# Patient Record
Sex: Male | Born: 1940 | Race: White | Hispanic: No | Marital: Married | Smoking: Current every day smoker
Health system: Southern US, Community
[De-identification: ages and names within clinical notes are randomized; demographics above are authoritative.]

## PROBLEM LIST (undated history)

## (undated) DIAGNOSIS — N529 Male erectile dysfunction, unspecified: Secondary | ICD-10-CM

## (undated) DIAGNOSIS — M5137 Other intervertebral disc degeneration, lumbosacral region: Secondary | ICD-10-CM

## (undated) DIAGNOSIS — M75101 Unspecified rotator cuff tear or rupture of right shoulder, not specified as traumatic: Secondary | ICD-10-CM

## (undated) DIAGNOSIS — F32A Depression, unspecified: Secondary | ICD-10-CM

## (undated) DIAGNOSIS — I639 Cerebral infarction, unspecified: Secondary | ICD-10-CM

## (undated) DIAGNOSIS — I491 Atrial premature depolarization: Secondary | ICD-10-CM

## (undated) DIAGNOSIS — M75102 Unspecified rotator cuff tear or rupture of left shoulder, not specified as traumatic: Secondary | ICD-10-CM

## (undated) DIAGNOSIS — N486 Induration penis plastica: Secondary | ICD-10-CM

## (undated) DIAGNOSIS — I10 Essential (primary) hypertension: Secondary | ICD-10-CM

## (undated) DIAGNOSIS — K5730 Diverticulosis of large intestine without perforation or abscess without bleeding: Secondary | ICD-10-CM

## (undated) DIAGNOSIS — M1990 Unspecified osteoarthritis, unspecified site: Secondary | ICD-10-CM

## (undated) DIAGNOSIS — K559 Vascular disorder of intestine, unspecified: Secondary | ICD-10-CM

## (undated) DIAGNOSIS — Z87442 Personal history of urinary calculi: Secondary | ICD-10-CM

## (undated) DIAGNOSIS — I2510 Atherosclerotic heart disease of native coronary artery without angina pectoris: Secondary | ICD-10-CM

## (undated) DIAGNOSIS — M12811 Other specific arthropathies, not elsewhere classified, right shoulder: Secondary | ICD-10-CM

## (undated) DIAGNOSIS — N201 Calculus of ureter: Secondary | ICD-10-CM

## (undated) DIAGNOSIS — Z8546 Personal history of malignant neoplasm of prostate: Secondary | ICD-10-CM

## (undated) DIAGNOSIS — Z9889 Other specified postprocedural states: Secondary | ICD-10-CM

## (undated) DIAGNOSIS — Z8711 Personal history of peptic ulcer disease: Secondary | ICD-10-CM

## (undated) DIAGNOSIS — R159 Full incontinence of feces: Secondary | ICD-10-CM

## (undated) DIAGNOSIS — M51379 Other intervertebral disc degeneration, lumbosacral region without mention of lumbar back pain or lower extremity pain: Secondary | ICD-10-CM

## (undated) DIAGNOSIS — L309 Dermatitis, unspecified: Secondary | ICD-10-CM

## (undated) DIAGNOSIS — E785 Hyperlipidemia, unspecified: Secondary | ICD-10-CM

## (undated) DIAGNOSIS — F329 Major depressive disorder, single episode, unspecified: Secondary | ICD-10-CM

## (undated) DIAGNOSIS — D3502 Benign neoplasm of left adrenal gland: Secondary | ICD-10-CM

## (undated) DIAGNOSIS — Z8719 Personal history of other diseases of the digestive system: Secondary | ICD-10-CM

## (undated) DIAGNOSIS — K589 Irritable bowel syndrome without diarrhea: Secondary | ICD-10-CM

## (undated) DIAGNOSIS — M12812 Other specific arthropathies, not elsewhere classified, left shoulder: Secondary | ICD-10-CM

## (undated) DIAGNOSIS — K219 Gastro-esophageal reflux disease without esophagitis: Secondary | ICD-10-CM

## (undated) HISTORY — DX: Essential (primary) hypertension: I10

## (undated) HISTORY — DX: Full incontinence of feces: R15.9

## (undated) HISTORY — DX: Major depressive disorder, single episode, unspecified: F32.9

## (undated) HISTORY — DX: Cerebral infarction, unspecified: I63.9

## (undated) HISTORY — PX: COLONOSCOPY: SHX174

## (undated) HISTORY — DX: Gastro-esophageal reflux disease without esophagitis: K21.9

## (undated) HISTORY — PX: TONSILLECTOMY AND ADENOIDECTOMY: SUR1326

## (undated) HISTORY — DX: Depression, unspecified: F32.A

## (undated) HISTORY — DX: Male erectile dysfunction, unspecified: N52.9

## (undated) HISTORY — PX: LOOP RECORDER IMPLANT: SHX5954

## (undated) HISTORY — DX: Induration penis plastica: N48.6

## (undated) HISTORY — DX: Hyperlipidemia, unspecified: E78.5

## (undated) HISTORY — PX: CARDIOVASCULAR STRESS TEST: SHX262

## (undated) HISTORY — DX: Atherosclerotic heart disease of native coronary artery without angina pectoris: I25.10

## (undated) HISTORY — DX: Vascular disorder of intestine, unspecified: K55.9

## (undated) HISTORY — PX: NASAL SEPTUM SURGERY: SHX37

---

## 1997-06-08 HISTORY — PX: LUMBAR LAMINECTOMY: SHX95

## 2001-02-04 HISTORY — PX: ESOPHAGOGASTRODUODENOSCOPY (EGD) WITH ESOPHAGEAL DILATION: SHX5812

## 2003-07-20 ENCOUNTER — Inpatient Hospital Stay (HOSPITAL_COMMUNITY): Admission: EM | Admit: 2003-07-20 | Discharge: 2003-07-24 | Payer: Self-pay | Admitting: Emergency Medicine

## 2003-07-23 HISTORY — PX: CARDIAC CATHETERIZATION: SHX172

## 2003-10-20 ENCOUNTER — Inpatient Hospital Stay (HOSPITAL_COMMUNITY): Admission: EM | Admit: 2003-10-20 | Discharge: 2003-10-21 | Payer: Self-pay | Admitting: Emergency Medicine

## 2004-04-08 ENCOUNTER — Ambulatory Visit: Payer: Self-pay | Admitting: Internal Medicine

## 2004-04-22 ENCOUNTER — Ambulatory Visit: Payer: Self-pay | Admitting: Internal Medicine

## 2004-05-02 ENCOUNTER — Ambulatory Visit: Payer: Self-pay | Admitting: Internal Medicine

## 2004-05-05 ENCOUNTER — Emergency Department (HOSPITAL_COMMUNITY): Admission: EM | Admit: 2004-05-05 | Discharge: 2004-05-05 | Payer: Self-pay | Admitting: Emergency Medicine

## 2004-05-15 ENCOUNTER — Ambulatory Visit: Payer: Self-pay | Admitting: Family Medicine

## 2004-12-10 ENCOUNTER — Ambulatory Visit: Payer: Self-pay | Admitting: Internal Medicine

## 2005-02-05 ENCOUNTER — Ambulatory Visit: Payer: Self-pay | Admitting: Internal Medicine

## 2005-02-11 ENCOUNTER — Ambulatory Visit: Payer: Self-pay | Admitting: Internal Medicine

## 2005-02-19 ENCOUNTER — Ambulatory Visit: Payer: Self-pay | Admitting: Cardiology

## 2005-02-25 ENCOUNTER — Ambulatory Visit: Payer: Self-pay | Admitting: Internal Medicine

## 2005-03-02 ENCOUNTER — Ambulatory Visit: Payer: Self-pay | Admitting: Internal Medicine

## 2005-03-26 ENCOUNTER — Inpatient Hospital Stay (HOSPITAL_COMMUNITY): Admission: RE | Admit: 2005-03-26 | Discharge: 2005-03-31 | Payer: Self-pay | Admitting: Orthopaedic Surgery

## 2005-03-26 HISTORY — PX: LUMBAR FUSION: SHX111

## 2005-07-02 ENCOUNTER — Ambulatory Visit: Payer: Self-pay | Admitting: Internal Medicine

## 2005-08-04 ENCOUNTER — Ambulatory Visit: Payer: Self-pay | Admitting: Internal Medicine

## 2005-08-28 ENCOUNTER — Ambulatory Visit: Payer: Self-pay | Admitting: Internal Medicine

## 2005-09-01 ENCOUNTER — Ambulatory Visit: Payer: Self-pay | Admitting: Internal Medicine

## 2005-10-28 ENCOUNTER — Ambulatory Visit: Payer: Self-pay | Admitting: Internal Medicine

## 2006-01-21 ENCOUNTER — Ambulatory Visit: Payer: Self-pay | Admitting: Cardiology

## 2006-02-01 ENCOUNTER — Ambulatory Visit: Payer: Self-pay

## 2006-02-16 ENCOUNTER — Ambulatory Visit: Payer: Self-pay | Admitting: Internal Medicine

## 2006-03-25 ENCOUNTER — Ambulatory Visit: Payer: Self-pay | Admitting: Cardiology

## 2006-03-29 ENCOUNTER — Encounter: Admission: RE | Admit: 2006-03-29 | Discharge: 2006-03-29 | Payer: Self-pay | Admitting: Orthopaedic Surgery

## 2006-05-07 ENCOUNTER — Ambulatory Visit: Payer: Self-pay | Admitting: Internal Medicine

## 2006-06-25 ENCOUNTER — Ambulatory Visit: Payer: Self-pay | Admitting: Internal Medicine

## 2006-06-25 LAB — CONVERTED CEMR LAB
BUN: 14 mg/dL (ref 6–23)
CO2: 30 meq/L (ref 19–32)
Calcium: 9.7 mg/dL (ref 8.4–10.5)
Chloride: 105 meq/L (ref 96–112)
Creatinine, Ser: 1 mg/dL (ref 0.4–1.5)
GFR calc Af Amer: 96 mL/min
GFR calc non Af Amer: 79 mL/min
Glucose, Bld: 92 mg/dL (ref 70–99)
HCT: 45.1 % (ref 39.0–52.0)
Hemoglobin: 15 g/dL (ref 13.0–17.0)
MCHC: 33.2 g/dL (ref 30.0–36.0)
MCV: 94.3 fL (ref 78.0–100.0)
Platelets: 315 10*3/uL (ref 150–400)
Potassium: 3.7 meq/L (ref 3.5–5.1)
RBC: 4.78 M/uL (ref 4.22–5.81)
RDW: 12.5 % (ref 11.5–14.6)
Sodium: 142 meq/L (ref 135–145)
WBC: 7 10*3/uL (ref 4.5–10.5)

## 2006-07-13 DIAGNOSIS — K279 Peptic ulcer, site unspecified, unspecified as acute or chronic, without hemorrhage or perforation: Secondary | ICD-10-CM

## 2006-07-13 DIAGNOSIS — K219 Gastro-esophageal reflux disease without esophagitis: Secondary | ICD-10-CM

## 2006-07-13 DIAGNOSIS — I10 Essential (primary) hypertension: Secondary | ICD-10-CM

## 2006-07-13 DIAGNOSIS — N486 Induration penis plastica: Secondary | ICD-10-CM

## 2006-07-13 DIAGNOSIS — E785 Hyperlipidemia, unspecified: Secondary | ICD-10-CM

## 2006-07-13 DIAGNOSIS — F528 Other sexual dysfunction not due to a substance or known physiological condition: Secondary | ICD-10-CM

## 2006-07-14 ENCOUNTER — Ambulatory Visit: Payer: Self-pay | Admitting: Cardiology

## 2006-07-14 LAB — CONVERTED CEMR LAB
ALT: 29 units/L (ref 0–40)
AST: 26 units/L (ref 0–37)
Albumin: 3.8 g/dL (ref 3.5–5.2)
Alkaline Phosphatase: 74 units/L (ref 39–117)
BUN: 17 mg/dL (ref 6–23)
Bilirubin, Direct: 0.1 mg/dL (ref 0.0–0.3)
CO2: 28 meq/L (ref 19–32)
Calcium: 9.2 mg/dL (ref 8.4–10.5)
Chloride: 104 meq/L (ref 96–112)
Creatinine, Ser: 1 mg/dL (ref 0.4–1.5)
GFR calc Af Amer: 96 mL/min
GFR calc non Af Amer: 79 mL/min
Glucose, Bld: 90 mg/dL (ref 70–99)
Magnesium: 2.4 mg/dL (ref 1.5–2.5)
Potassium: 3.2 meq/L — ABNORMAL LOW (ref 3.5–5.1)
Sodium: 140 meq/L (ref 135–145)
Total Bilirubin: 0.5 mg/dL (ref 0.3–1.2)
Total Protein: 7 g/dL (ref 6.0–8.3)

## 2006-07-22 ENCOUNTER — Ambulatory Visit: Payer: Self-pay | Admitting: Cardiology

## 2006-07-22 LAB — CONVERTED CEMR LAB
BUN: 21 mg/dL (ref 6–23)
CO2: 28 meq/L (ref 19–32)
Calcium: 9.2 mg/dL (ref 8.4–10.5)
Chloride: 105 meq/L (ref 96–112)
Creatinine, Ser: 0.9 mg/dL (ref 0.4–1.5)
GFR calc Af Amer: 109 mL/min
GFR calc non Af Amer: 90 mL/min
Glucose, Bld: 112 mg/dL — ABNORMAL HIGH (ref 70–99)
Potassium: 3.5 meq/L (ref 3.5–5.1)
Sodium: 142 meq/L (ref 135–145)

## 2006-08-13 ENCOUNTER — Ambulatory Visit: Payer: Self-pay | Admitting: Cardiology

## 2006-08-31 ENCOUNTER — Ambulatory Visit: Payer: Self-pay | Admitting: Internal Medicine

## 2006-09-23 ENCOUNTER — Ambulatory Visit: Payer: Self-pay | Admitting: Gastroenterology

## 2006-09-24 ENCOUNTER — Ambulatory Visit: Payer: Self-pay | Admitting: Cardiology

## 2006-09-24 LAB — CONVERTED CEMR LAB
Cholesterol: 218 mg/dL (ref 0–200)
Direct LDL: 136.8 mg/dL
HDL: 36.2 mg/dL — ABNORMAL LOW (ref >39.0)
Total CHOL/HDL Ratio: 6
Triglycerides: 222 mg/dL (ref 0–149)
VLDL: 44 mg/dL — ABNORMAL HIGH (ref 0–40)

## 2006-10-07 ENCOUNTER — Ambulatory Visit: Payer: Self-pay | Admitting: Gastroenterology

## 2006-10-11 ENCOUNTER — Ambulatory Visit: Payer: Self-pay | Admitting: Cardiovascular Disease

## 2006-10-11 ENCOUNTER — Ambulatory Visit: Payer: Self-pay | Admitting: Internal Medicine

## 2006-10-11 LAB — CONVERTED CEMR LAB
BUN: 11 mg/dL (ref 6–23)
Basophils Absolute: 0.1 10*3/uL (ref 0.0–0.1)
Basophils Relative: 1.4 % — ABNORMAL HIGH (ref 0.0–1.0)
CO2: 28 meq/L (ref 19–32)
Calcium: 9.3 mg/dL (ref 8.4–10.5)
Chloride: 108 meq/L (ref 96–112)
Creatinine, Ser: 0.8 mg/dL (ref 0.4–1.5)
Eosinophils Absolute: 0.5 10*3/uL (ref 0.0–0.6)
Eosinophils Relative: 7.1 % — ABNORMAL HIGH (ref 0.0–5.0)
GFR calc Af Amer: 124 mL/min
GFR calc non Af Amer: 103 mL/min
Glucose, Bld: 89 mg/dL (ref 70–99)
HCT: 39.7 % (ref 39.0–52.0)
Hemoglobin: 14 g/dL (ref 13.0–17.0)
Lymphocytes Relative: 19.2 % (ref 12.0–46.0)
MCHC: 35.3 g/dL (ref 30.0–36.0)
MCV: 91.3 fL (ref 78.0–100.0)
Monocytes Absolute: 0.5 10*3/uL (ref 0.2–0.7)
Monocytes Relative: 7.1 % (ref 3.0–11.0)
Neutro Abs: 5 10*3/uL (ref 1.4–7.7)
Neutrophils Relative %: 65.2 % (ref 43.0–77.0)
Platelets: 355 10*3/uL (ref 150–400)
Potassium: 3.3 meq/L — ABNORMAL LOW (ref 3.5–5.1)
RBC: 4.35 M/uL (ref 4.22–5.81)
RDW: 12.3 % (ref 11.5–14.6)
Sodium: 142 meq/L (ref 135–145)
WBC: 7.6 10*3/uL (ref 4.5–10.5)

## 2006-11-03 ENCOUNTER — Ambulatory Visit: Payer: Self-pay | Admitting: Internal Medicine

## 2007-01-19 ENCOUNTER — Ambulatory Visit: Payer: Self-pay | Admitting: Internal Medicine

## 2007-01-19 DIAGNOSIS — R5383 Other fatigue: Secondary | ICD-10-CM

## 2007-01-19 DIAGNOSIS — R5381 Other malaise: Secondary | ICD-10-CM

## 2007-01-19 LAB — CONVERTED CEMR LAB
BUN: 17 mg/dL (ref 6–23)
CO2: 28 meq/L (ref 19–32)
Calcium: 9.8 mg/dL (ref 8.4–10.5)
Chloride: 101 meq/L (ref 96–112)
Creatinine, Ser: 0.9 mg/dL (ref 0.4–1.5)
GFR calc Af Amer: 108 mL/min
GFR calc non Af Amer: 89 mL/min
Glucose, Bld: 95 mg/dL (ref 70–99)
Potassium: 3.6 meq/L (ref 3.5–5.1)
Sodium: 138 meq/L (ref 135–145)

## 2007-01-20 ENCOUNTER — Encounter (INDEPENDENT_AMBULATORY_CARE_PROVIDER_SITE_OTHER): Payer: Self-pay | Admitting: *Deleted

## 2007-02-09 ENCOUNTER — Encounter (INDEPENDENT_AMBULATORY_CARE_PROVIDER_SITE_OTHER): Payer: Self-pay | Admitting: *Deleted

## 2007-02-14 ENCOUNTER — Ambulatory Visit: Payer: Self-pay | Admitting: Cardiology

## 2007-02-17 ENCOUNTER — Ambulatory Visit: Payer: Self-pay | Admitting: Cardiology

## 2007-04-06 ENCOUNTER — Ambulatory Visit: Payer: Self-pay | Admitting: Internal Medicine

## 2007-05-23 ENCOUNTER — Ambulatory Visit: Payer: Self-pay | Admitting: Internal Medicine

## 2007-07-28 ENCOUNTER — Encounter: Payer: Self-pay | Admitting: Internal Medicine

## 2007-08-22 ENCOUNTER — Ambulatory Visit: Payer: Self-pay | Admitting: Internal Medicine

## 2007-08-22 DIAGNOSIS — F450 Somatization disorder: Secondary | ICD-10-CM

## 2007-08-22 DIAGNOSIS — F329 Major depressive disorder, single episode, unspecified: Secondary | ICD-10-CM

## 2007-09-08 ENCOUNTER — Telehealth (INDEPENDENT_AMBULATORY_CARE_PROVIDER_SITE_OTHER): Payer: Self-pay | Admitting: *Deleted

## 2007-09-08 LAB — CONVERTED CEMR LAB
ALT: 54 units/L — ABNORMAL HIGH (ref 0–53)
AST: 25 units/L (ref 0–37)
BUN: 23 mg/dL (ref 6–23)
Basophils Absolute: 0 10*3/uL (ref 0.0–0.1)
Basophils Relative: 0.3 % (ref 0.0–1.0)
CO2: 31 meq/L (ref 19–32)
Calcium: 9.6 mg/dL (ref 8.4–10.5)
Chloride: 106 meq/L (ref 96–112)
Cholesterol: 212 mg/dL (ref 0–200)
Creatinine, Ser: 1 mg/dL (ref 0.4–1.5)
Direct LDL: 134.5 mg/dL
Eosinophils Absolute: 0.1 10*3/uL (ref 0.0–0.6)
Eosinophils Relative: 0.8 % (ref 0.0–5.0)
GFR calc Af Amer: 96 mL/min
GFR calc non Af Amer: 79 mL/min
Glucose, Bld: 94 mg/dL (ref 70–99)
HCT: 50.5 % (ref 39.0–52.0)
HDL: 54.3 mg/dL (ref >39.0)
Hemoglobin: 16.6 g/dL (ref 13.0–17.0)
Lymphocytes Relative: 11.2 % — ABNORMAL LOW (ref 12.0–46.0)
MCHC: 32.9 g/dL (ref 30.0–36.0)
MCV: 96.6 fL (ref 78.0–100.0)
Monocytes Absolute: 0.7 10*3/uL (ref 0.2–0.7)
Monocytes Relative: 5.5 % (ref 3.0–11.0)
Neutro Abs: 10 10*3/uL — ABNORMAL HIGH (ref 1.4–7.7)
Neutrophils Relative %: 82.2 % — ABNORMAL HIGH (ref 43.0–77.0)
PSA: 6.69 ng/mL — ABNORMAL HIGH (ref 0.10–4.00)
Platelets: 328 10*3/uL (ref 150–400)
Potassium: 4 meq/L (ref 3.5–5.1)
RBC: 5.22 M/uL (ref 4.22–5.81)
RDW: 13.6 % (ref 11.5–14.6)
Sodium: 142 meq/L (ref 135–145)
TSH: 0.58 microintl units/mL (ref 0.35–5.50)
Total CHOL/HDL Ratio: 3.9
Triglycerides: 137 mg/dL (ref 0–149)
VLDL: 27 mg/dL (ref 0–40)
WBC: 12.2 10*3/uL — ABNORMAL HIGH (ref 4.5–10.5)

## 2007-09-13 ENCOUNTER — Encounter (INDEPENDENT_AMBULATORY_CARE_PROVIDER_SITE_OTHER): Payer: Self-pay | Admitting: *Deleted

## 2007-09-23 ENCOUNTER — Encounter: Payer: Self-pay | Admitting: Internal Medicine

## 2007-10-14 ENCOUNTER — Telehealth (INDEPENDENT_AMBULATORY_CARE_PROVIDER_SITE_OTHER): Payer: Self-pay | Admitting: *Deleted

## 2007-10-26 ENCOUNTER — Encounter: Payer: Self-pay | Admitting: Internal Medicine

## 2007-11-08 ENCOUNTER — Encounter: Payer: Self-pay | Admitting: Internal Medicine

## 2007-11-16 ENCOUNTER — Encounter: Payer: Self-pay | Admitting: Internal Medicine

## 2007-11-28 ENCOUNTER — Ambulatory Visit: Payer: Self-pay | Admitting: Internal Medicine

## 2007-11-28 DIAGNOSIS — C61 Malignant neoplasm of prostate: Secondary | ICD-10-CM

## 2007-12-01 ENCOUNTER — Encounter (INDEPENDENT_AMBULATORY_CARE_PROVIDER_SITE_OTHER): Payer: Self-pay | Admitting: *Deleted

## 2007-12-01 LAB — CONVERTED CEMR LAB
ALT: 36 units/L (ref 0–53)
AST: 25 units/L (ref 0–37)
Basophils Absolute: 0.2 10*3/uL — ABNORMAL HIGH (ref 0.0–0.1)
Basophils Relative: 2.1 % — ABNORMAL HIGH (ref 0.0–1.0)
Cholesterol: 130 mg/dL (ref 0–200)
Eosinophils Absolute: 0.1 10*3/uL (ref 0.0–0.7)
Eosinophils Relative: 1.5 % (ref 0.0–5.0)
HCT: 41.7 % (ref 39.0–52.0)
HDL: 39.1 mg/dL (ref >39.0)
Hemoglobin: 14.9 g/dL (ref 13.0–17.0)
LDL Cholesterol: 70 mg/dL (ref 0–99)
Lymphocytes Relative: 13.7 % (ref 12.0–46.0)
MCHC: 35.6 g/dL (ref 30.0–36.0)
MCV: 98.7 fL (ref 78.0–100.0)
Monocytes Absolute: 0.1 10*3/uL (ref 0.1–1.0)
Monocytes Relative: 1.8 % — ABNORMAL LOW (ref 3.0–12.0)
Neutro Abs: 6.6 10*3/uL (ref 1.4–7.7)
Neutrophils Relative %: 80.9 % — ABNORMAL HIGH (ref 43.0–77.0)
Platelets: 285 10*3/uL (ref 150–400)
RBC: 4.22 M/uL (ref 4.22–5.81)
RDW: 11.8 % (ref 11.5–14.6)
Total CHOL/HDL Ratio: 3.3
Triglycerides: 104 mg/dL (ref 0–149)
VLDL: 21 mg/dL (ref 0–40)
WBC: 8.1 10*3/uL (ref 4.5–10.5)

## 2007-12-13 ENCOUNTER — Ambulatory Visit: Payer: Self-pay | Admitting: Internal Medicine

## 2007-12-14 ENCOUNTER — Encounter: Payer: Self-pay | Admitting: Internal Medicine

## 2007-12-19 HISTORY — PX: OTHER SURGICAL HISTORY: SHX169

## 2007-12-29 ENCOUNTER — Inpatient Hospital Stay (HOSPITAL_COMMUNITY): Admission: RE | Admit: 2007-12-29 | Discharge: 2008-01-02 | Payer: Self-pay | Admitting: Urology

## 2007-12-29 ENCOUNTER — Encounter: Payer: Self-pay | Admitting: Urology

## 2007-12-29 HISTORY — PX: OTHER SURGICAL HISTORY: SHX169

## 2008-01-07 ENCOUNTER — Emergency Department (HOSPITAL_COMMUNITY): Admission: EM | Admit: 2008-01-07 | Discharge: 2008-01-07 | Payer: Self-pay | Admitting: Emergency Medicine

## 2008-02-17 ENCOUNTER — Encounter: Payer: Self-pay | Admitting: Internal Medicine

## 2008-03-05 ENCOUNTER — Telehealth (INDEPENDENT_AMBULATORY_CARE_PROVIDER_SITE_OTHER): Payer: Self-pay | Admitting: *Deleted

## 2008-03-05 ENCOUNTER — Ambulatory Visit: Payer: Self-pay | Admitting: Internal Medicine

## 2008-03-06 ENCOUNTER — Ambulatory Visit: Payer: Self-pay | Admitting: Internal Medicine

## 2008-03-09 ENCOUNTER — Telehealth (INDEPENDENT_AMBULATORY_CARE_PROVIDER_SITE_OTHER): Payer: Self-pay | Admitting: *Deleted

## 2008-03-13 ENCOUNTER — Telehealth: Payer: Self-pay | Admitting: Internal Medicine

## 2008-03-13 ENCOUNTER — Emergency Department (HOSPITAL_COMMUNITY): Admission: EM | Admit: 2008-03-13 | Discharge: 2008-03-14 | Payer: Self-pay | Admitting: Emergency Medicine

## 2008-03-14 ENCOUNTER — Ambulatory Visit: Payer: Self-pay | Admitting: Internal Medicine

## 2008-03-14 ENCOUNTER — Telehealth: Payer: Self-pay | Admitting: Internal Medicine

## 2008-03-15 ENCOUNTER — Inpatient Hospital Stay (HOSPITAL_COMMUNITY): Admission: AD | Admit: 2008-03-15 | Discharge: 2008-03-17 | Payer: Self-pay | Admitting: Internal Medicine

## 2008-03-15 ENCOUNTER — Ambulatory Visit: Payer: Self-pay | Admitting: Internal Medicine

## 2008-03-15 ENCOUNTER — Ambulatory Visit: Payer: Self-pay | Admitting: Gastroenterology

## 2008-03-15 ENCOUNTER — Encounter: Payer: Self-pay | Admitting: Internal Medicine

## 2008-03-15 ENCOUNTER — Encounter: Payer: Self-pay | Admitting: Gastroenterology

## 2008-03-16 ENCOUNTER — Encounter: Payer: Self-pay | Admitting: Gastroenterology

## 2008-03-19 ENCOUNTER — Telehealth: Payer: Self-pay | Admitting: Internal Medicine

## 2008-03-23 ENCOUNTER — Ambulatory Visit: Payer: Self-pay | Admitting: Internal Medicine

## 2008-03-28 ENCOUNTER — Telehealth: Payer: Self-pay | Admitting: Internal Medicine

## 2008-04-05 ENCOUNTER — Telehealth: Payer: Self-pay | Admitting: Internal Medicine

## 2008-04-06 ENCOUNTER — Telehealth: Payer: Self-pay | Admitting: Gastroenterology

## 2008-04-10 ENCOUNTER — Ambulatory Visit: Payer: Self-pay | Admitting: Gastroenterology

## 2008-04-10 DIAGNOSIS — K5730 Diverticulosis of large intestine without perforation or abscess without bleeding: Secondary | ICD-10-CM

## 2008-04-10 LAB — CONVERTED CEMR LAB
BUN: 18 mg/dL (ref 6–23)
CO2: 31 meq/L (ref 19–32)
Calcium: 9.4 mg/dL (ref 8.4–10.5)
Chloride: 102 meq/L (ref 96–112)
Creatinine, Ser: 0.8 mg/dL (ref 0.4–1.5)
GFR calc Af Amer: 124 mL/min
GFR calc non Af Amer: 102 mL/min
Glucose, Bld: 89 mg/dL (ref 70–99)
Potassium: 4.2 meq/L (ref 3.5–5.1)
Sodium: 140 meq/L (ref 135–145)

## 2008-04-13 ENCOUNTER — Ambulatory Visit: Payer: Self-pay | Admitting: Cardiology

## 2008-05-01 ENCOUNTER — Encounter: Payer: Self-pay | Admitting: Internal Medicine

## 2008-07-02 ENCOUNTER — Telehealth: Payer: Self-pay | Admitting: Internal Medicine

## 2008-07-02 ENCOUNTER — Ambulatory Visit: Payer: Self-pay | Admitting: Cardiology

## 2008-07-02 ENCOUNTER — Telehealth: Payer: Self-pay | Admitting: Family Medicine

## 2008-07-02 LAB — CONVERTED CEMR LAB
BUN: 25 mg/dL — ABNORMAL HIGH (ref 6–23)
Basophils Absolute: 0.1 10*3/uL (ref 0.0–0.1)
Basophils Relative: 0.5 % (ref 0.0–3.0)
CO2: 31 meq/L (ref 19–32)
Calcium: 10.4 mg/dL (ref 8.4–10.5)
Chloride: 105 meq/L (ref 96–112)
Creatinine, Ser: 0.7 mg/dL (ref 0.4–1.5)
Eosinophils Absolute: 0.3 10*3/uL (ref 0.0–0.7)
Eosinophils Relative: 2.7 % (ref 0.0–5.0)
GFR calc Af Amer: 144 mL/min
GFR calc non Af Amer: 119 mL/min
Glucose, Bld: 85 mg/dL (ref 70–99)
HCT: 48.1 % (ref 39.0–52.0)
Hemoglobin: 16.7 g/dL (ref 13.0–17.0)
Lymphocytes Relative: 15.7 % (ref 12.0–46.0)
MCHC: 34.7 g/dL (ref 30.0–36.0)
MCV: 95.4 fL (ref 78.0–100.0)
Monocytes Absolute: 0.6 10*3/uL (ref 0.1–1.0)
Monocytes Relative: 5.4 % (ref 3.0–12.0)
Neutro Abs: 7.9 10*3/uL — ABNORMAL HIGH (ref 1.4–7.7)
Neutrophils Relative %: 75.7 % (ref 43.0–77.0)
Platelets: 278 10*3/uL (ref 150–400)
Potassium: 3.5 meq/L (ref 3.5–5.1)
RBC: 5.04 M/uL (ref 4.22–5.81)
RDW: 12.5 % (ref 11.5–14.6)
Sodium: 143 meq/L (ref 135–145)
TSH: 0.67 microintl units/mL (ref 0.35–5.50)
WBC: 10.6 10*3/uL — ABNORMAL HIGH (ref 4.5–10.5)

## 2008-07-04 ENCOUNTER — Ambulatory Visit: Payer: Self-pay

## 2008-07-12 ENCOUNTER — Encounter: Payer: Self-pay | Admitting: Cardiology

## 2008-07-12 ENCOUNTER — Ambulatory Visit: Payer: Self-pay

## 2008-07-31 ENCOUNTER — Ambulatory Visit: Payer: Self-pay | Admitting: Cardiology

## 2008-08-13 ENCOUNTER — Ambulatory Visit: Payer: Self-pay | Admitting: Internal Medicine

## 2008-10-10 ENCOUNTER — Ambulatory Visit: Payer: Self-pay | Admitting: Internal Medicine

## 2008-10-10 DIAGNOSIS — M545 Low back pain: Secondary | ICD-10-CM | POA: Insufficient documentation

## 2008-10-10 LAB — CONVERTED CEMR LAB
Bilirubin Urine: NEGATIVE
Blood in Urine, dipstick: NEGATIVE
Glucose, Urine, Semiquant: NEGATIVE
Ketones, urine, test strip: NEGATIVE
Nitrite: NEGATIVE
Protein, U semiquant: NEGATIVE
Specific Gravity, Urine: <1.005
Urobilinogen, UA: 0.2
WBC Urine, dipstick: NEGATIVE
pH: 7

## 2008-10-31 ENCOUNTER — Encounter: Payer: Self-pay | Admitting: Internal Medicine

## 2008-11-12 ENCOUNTER — Encounter: Payer: Self-pay | Admitting: Internal Medicine

## 2008-11-12 ENCOUNTER — Encounter: Admission: RE | Admit: 2008-11-12 | Discharge: 2008-11-12 | Payer: Self-pay | Admitting: Orthopaedic Surgery

## 2009-02-04 ENCOUNTER — Ambulatory Visit: Payer: Self-pay | Admitting: Cardiology

## 2009-02-04 DIAGNOSIS — R002 Palpitations: Secondary | ICD-10-CM | POA: Insufficient documentation

## 2009-02-04 DIAGNOSIS — I2510 Atherosclerotic heart disease of native coronary artery without angina pectoris: Secondary | ICD-10-CM | POA: Insufficient documentation

## 2009-02-13 ENCOUNTER — Ambulatory Visit: Payer: Self-pay | Admitting: Cardiology

## 2009-02-13 DIAGNOSIS — Z720 Tobacco use: Secondary | ICD-10-CM

## 2009-03-07 ENCOUNTER — Encounter (INDEPENDENT_AMBULATORY_CARE_PROVIDER_SITE_OTHER): Payer: Self-pay | Admitting: *Deleted

## 2009-04-12 ENCOUNTER — Ambulatory Visit: Payer: Self-pay | Admitting: Internal Medicine

## 2009-04-16 ENCOUNTER — Telehealth: Payer: Self-pay | Admitting: Internal Medicine

## 2009-04-16 LAB — CONVERTED CEMR LAB
ALT: 38 units/L (ref 0–53)
AST: 26 units/L (ref 0–37)
BUN: 20 mg/dL (ref 6–23)
CO2: 30 meq/L (ref 19–32)
Calcium: 9.9 mg/dL (ref 8.4–10.5)
Chloride: 107 meq/L (ref 96–112)
Cholesterol: 159 mg/dL (ref 0–200)
Creatinine, Ser: 0.9 mg/dL (ref 0.4–1.5)
GFR calc non Af Amer: 88.81 mL/min (ref >60)
Glucose, Bld: 99 mg/dL (ref 70–99)
HDL: 42.7 mg/dL (ref >39.00)
LDL Cholesterol: 80 mg/dL (ref 0–99)
Potassium: 4.9 meq/L (ref 3.5–5.1)
Sodium: 145 meq/L (ref 135–145)
TSH: 0.49 microintl units/mL (ref 0.35–5.50)
Total CHOL/HDL Ratio: 4
Triglycerides: 181 mg/dL — ABNORMAL HIGH (ref 0.0–149.0)
VLDL: 36.2 mg/dL (ref 0.0–40.0)

## 2009-05-06 ENCOUNTER — Encounter: Payer: Self-pay | Admitting: Internal Medicine

## 2009-05-14 ENCOUNTER — Emergency Department (HOSPITAL_COMMUNITY): Admission: EM | Admit: 2009-05-14 | Discharge: 2009-05-14 | Payer: Self-pay | Admitting: Emergency Medicine

## 2009-05-28 ENCOUNTER — Encounter: Payer: Self-pay | Admitting: Internal Medicine

## 2009-05-30 ENCOUNTER — Telehealth (INDEPENDENT_AMBULATORY_CARE_PROVIDER_SITE_OTHER): Payer: Self-pay | Admitting: *Deleted

## 2009-06-13 ENCOUNTER — Encounter (INDEPENDENT_AMBULATORY_CARE_PROVIDER_SITE_OTHER): Payer: Self-pay | Admitting: *Deleted

## 2009-06-19 ENCOUNTER — Encounter: Payer: Self-pay | Admitting: Internal Medicine

## 2009-07-30 ENCOUNTER — Encounter: Payer: Self-pay | Admitting: Cardiology

## 2009-07-31 ENCOUNTER — Ambulatory Visit: Payer: Self-pay | Admitting: Cardiology

## 2009-08-09 ENCOUNTER — Emergency Department (HOSPITAL_COMMUNITY): Admission: EM | Admit: 2009-08-09 | Discharge: 2009-08-09 | Payer: Self-pay | Admitting: Emergency Medicine

## 2009-08-10 ENCOUNTER — Emergency Department (HOSPITAL_COMMUNITY): Admission: EM | Admit: 2009-08-10 | Discharge: 2009-08-10 | Payer: Self-pay | Admitting: Emergency Medicine

## 2009-08-23 ENCOUNTER — Encounter: Payer: Self-pay | Admitting: Internal Medicine

## 2009-08-23 LAB — CONVERTED CEMR LAB
Bacteria, UA: NONE SEEN
Bilirubin Urine: NEGATIVE
Bilirubin Urine: NEGATIVE
Casts: NONE SEEN /lpf
Glucose, Urine, Semiquant: NEGATIVE
Glucose, Urine, Semiquant: NEGATIVE
Ketones, urine, test strip: NEGATIVE
Ketones, urine, test strip: NEGATIVE
Nitrite: NEGATIVE
Nitrite: NEGATIVE
Protein, U semiquant: 30
Protein, U semiquant: 30
RBC, Urine, dipstick: NEGATIVE
RBC, Urine, dipstick: NEGATIVE
Specific Gravity, Urine: 1.025
Specific Gravity, Urine: 1.025
Urobilinogen, UA: 0.2
Urobilinogen, UA: 0.2
WBC Urine, dipstick: NEGATIVE
WBC Urine, dipstick: NEGATIVE
pH: 6.5
pH: 6.5

## 2009-08-27 ENCOUNTER — Encounter: Payer: Self-pay | Admitting: Internal Medicine

## 2009-08-28 ENCOUNTER — Telehealth: Payer: Self-pay | Admitting: Internal Medicine

## 2009-08-28 ENCOUNTER — Encounter: Payer: Self-pay | Admitting: Internal Medicine

## 2009-08-28 DIAGNOSIS — R809 Proteinuria, unspecified: Secondary | ICD-10-CM | POA: Insufficient documentation

## 2009-08-30 ENCOUNTER — Ambulatory Visit: Payer: Self-pay | Admitting: Internal Medicine

## 2009-08-30 ENCOUNTER — Encounter: Payer: Self-pay | Admitting: Family Medicine

## 2009-08-30 LAB — CONVERTED CEMR LAB
Bilirubin Urine: NEGATIVE
Blood in Urine, dipstick: NEGATIVE
Glucose, Urine, Semiquant: NEGATIVE
Ketones, urine, test strip: NEGATIVE
Nitrite: NEGATIVE
Protein, U semiquant: NEGATIVE
Specific Gravity, Urine: 1.02
Urobilinogen, UA: 0.2
WBC Urine, dipstick: NEGATIVE
pH: 5

## 2009-08-31 ENCOUNTER — Encounter: Payer: Self-pay | Admitting: Family Medicine

## 2009-09-02 LAB — CONVERTED CEMR LAB
Casts: NONE SEEN /lpf
Crystals: NONE SEEN
Squamous Epithelial / HPF: NONE SEEN /lpf

## 2009-10-12 HISTORY — PX: OTHER SURGICAL HISTORY: SHX169

## 2009-10-22 ENCOUNTER — Ambulatory Visit (HOSPITAL_COMMUNITY): Admission: RE | Admit: 2009-10-22 | Discharge: 2009-10-23 | Payer: Self-pay | Admitting: Urology

## 2009-10-25 ENCOUNTER — Encounter: Payer: Self-pay | Admitting: Internal Medicine

## 2009-11-08 ENCOUNTER — Encounter: Payer: Self-pay | Admitting: Internal Medicine

## 2009-11-11 ENCOUNTER — Encounter: Payer: Self-pay | Admitting: Internal Medicine

## 2009-11-25 ENCOUNTER — Encounter: Payer: Self-pay | Admitting: Internal Medicine

## 2009-12-05 ENCOUNTER — Encounter: Payer: Self-pay | Admitting: Internal Medicine

## 2009-12-20 ENCOUNTER — Ambulatory Visit: Payer: Self-pay | Admitting: Internal Medicine

## 2009-12-20 ENCOUNTER — Ambulatory Visit (HOSPITAL_BASED_OUTPATIENT_CLINIC_OR_DEPARTMENT_OTHER): Admission: RE | Admit: 2009-12-20 | Discharge: 2009-12-20 | Payer: Self-pay | Admitting: Internal Medicine

## 2009-12-20 ENCOUNTER — Ambulatory Visit: Payer: Self-pay | Admitting: Interventional Radiology

## 2010-01-09 ENCOUNTER — Encounter: Payer: Self-pay | Admitting: Cardiology

## 2010-01-09 ENCOUNTER — Telehealth: Payer: Self-pay | Admitting: Cardiology

## 2010-01-24 ENCOUNTER — Telehealth: Payer: Self-pay | Admitting: Internal Medicine

## 2010-03-17 ENCOUNTER — Ambulatory Visit (HOSPITAL_COMMUNITY)
Admission: RE | Admit: 2010-03-17 | Discharge: 2010-03-17 | Payer: Self-pay | Source: Home / Self Care | Admitting: Surgery

## 2010-03-17 ENCOUNTER — Encounter: Payer: Self-pay | Admitting: Internal Medicine

## 2010-03-17 HISTORY — PX: INGUINAL HERNIA REPAIR: SUR1180

## 2010-05-15 ENCOUNTER — Encounter: Payer: Self-pay | Admitting: Internal Medicine

## 2010-05-29 ENCOUNTER — Encounter: Payer: Self-pay | Admitting: Internal Medicine

## 2010-06-11 ENCOUNTER — Telehealth (INDEPENDENT_AMBULATORY_CARE_PROVIDER_SITE_OTHER): Payer: Self-pay | Admitting: *Deleted

## 2010-06-18 ENCOUNTER — Ambulatory Visit
Admission: RE | Admit: 2010-06-18 | Discharge: 2010-06-18 | Payer: Self-pay | Source: Home / Self Care | Attending: Internal Medicine | Admitting: Internal Medicine

## 2010-06-22 ENCOUNTER — Telehealth: Payer: Self-pay | Admitting: Internal Medicine

## 2010-06-25 ENCOUNTER — Encounter: Payer: Self-pay | Admitting: Gastroenterology

## 2010-07-08 NOTE — Letter (Signed)
Summary: f/u incontinence-- Urology    [facility]   Imported By: Lanelle Bal 11/21/2009 09:10:45  _____________________________________________________________________  External Attachment:    Type:   Image     Comment:   External Document

## 2010-07-08 NOTE — Op Note (Signed)
Summary: Hernia Repair/[facility]  Hernia [facility]   Imported By: Lanelle Bal 03/25/2010 15:25:04  _____________________________________________________________________  External Attachment:    Type:   Image     Comment:   External Document

## 2010-07-08 NOTE — Progress Notes (Signed)
Summary: +protein in urine  Phone Note From Other Clinic   Caller: fax from Dr. Emily Filbert - Derm Summary of Call: Received a fax with UA resuts from derm.  Note on labs "+ protein - needs further eval by primary MD" Shary Decamp  August 28, 2009 12:03 PM please order a UA, U dip and microalb Dx proteinuria Hanan Mcwilliams E. Camauri Fleece MD  August 28, 2009 4:02 PM  discussed with pt Shary Decamp  August 29, 2009 10:12 AM   New Problems: PROTEINURIA (ICD-791.0)   New Problems: PROTEINURIA (ICD-791.0)     Urinalysis Test Date: 08/23/2009  Dipstick:                      Results                  Reference Range  Appearance:         Clear                    Clear Color:              Yellow                   Yellow Specific Gravity:   1.025                    1.003-1.029 pH:                 6.5                      4.5-8.0 Protein:            30                       Negative Glucose:            Negative                 Negative Ketone:             Negative                 Negative Bilirubin:          Negative                 Negative Blood:              Negative                 Negative Nitrite:            Negative                 Negative Urobilinogen:       0.2                      0.2-1.0 Leukocyte:          Negative                 Negative   Microscopic:  RBC:                0-5     /HPF Bacteria:           none seen/HPF WBC:                0-5     /HPF Casts:              none seen/LPF Crystals:  present - calcium oxalate/HPF  Comments:  mucous threads present  Test performed by:  Dr. Emily Filbert

## 2010-07-08 NOTE — Assessment & Plan Note (Signed)
Summary: f48m rsc from nos on 07/16/09/lg  Medications Added VESICARE 5 MG TABS (SOLIFENACIN SUCCINATE) once daily ASPIR-LOW 81 MG TBEC (ASPIRIN) one tablet daily      Allergies Added:   Primary Provider:  Willow Ora, MD  CC:  no complaints.  History of Present Illness: 70 yo with history of HTN, hyperlipidemia, nonobstructive CAD, and palpitations due to PACs/PVCs presents for followup.  Mr Palazzi has been doing well since I saw him last.  Palpitations resolved on the higher dose of Toprol XL.  He is still smoking 1 ppd and not really thinking about quitting.  No chest pain.  He does get short of breath with heavier exertion like pulling the trash can to the street.   ECG:  NSR, normal  Labs (11/10): TSH normal, LDL 80, HDL 43  Current Medications (verified): 1)  Simvastatin 20 Mg  Tabs (Simvastatin) .Marland Kitchen.. 1 By Mouth Qhs 2)  Triamterene-Hctz 37.5-25 Mg  Caps (Triamterene-Hctz) .... Take One Tab By Mouth Daily 3)  Cartia Xt 240 Mg  Cp24 (Diltiazem Hcl Coated Beads) .... Take One Tablet Once Daily 4)  Omeprazole 20 Mg  Cpdr (Omeprazole) .Marland Kitchen.. 1 By Mouth Two Times A Day 5)  Mvi 6)  Toprol Xl 25 Mg Xr24h-Tab (Metoprolol Succinate) .... (2) in The Morning and (1) in The Evening 7)  Vesicare 5 Mg Tabs (Solifenacin Succinate) .... Once Daily 8)  Aspir-Low 81 Mg Tbec (Aspirin) .... One Tablet Daily  Allergies (verified): 1)  ! Sudafed 2)  ! Lodine 3)  ! Sulfa  Past History:  Past Medical History: 1. Nonobstructive coronary artery disease.  The patient's left heart catheterization done in 2005 showed 30% left main stenosis, an extensively calcified LAD with moderate plaquing, and EF was 60%.  There was no obstructive disease. 2. Hypertension. 3. PAC/PVCs.   4. Peptic ulcer disease. 5. Gastroesophageal reflux disease with history of stricture. 6. Hypertension. 7. Hyperlipidemia. 8. Tobacco abuse.  The patient smokes pack a day.  He has been off Chantix in the past.  However, he did not  want to use Chantix due  to fear for side effects 9. Diverticulosis. 10. Prostate cancer status post radical prostatectomy in July 2009.  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 Depression Dx  3-09 13. Diastolic dysfunction: Echo (2/10) with EF 60%, mild focal basal septal hypertrophy, moderate diastolic dysfunction.   Family History: Reviewed history from 08/22/2007 and no changes required. colon ca--aunt prostate ca--no MI--F at age 75  Social History: Reviewed history from 04/12/2009 and no changes required. Married, wife is disabled 2 kids fully retired Patient currently smokes 1ppd Alcohol Use - no Illicit Drug Use - no Patient does not get regular exercise but is active   Vital Signs:  Patient profile:   70 year old male Height:      66 inches Weight:      140 pounds BMI:     22.68 Pulse rate:   56 / minute BP sitting:   124 / 66  (left arm) Cuff size:   regular  Vitals Entered By: Hardin Negus, RMA (July 31, 2009 12:06 PM)  Physical Exam  General:  Well developed, well nourished, in no acute distress. Neck:  Neck supple, no JVD. No masses, thyromegaly or abnormal cervical nodes. Lungs:  Mildly decreased breath sounds bilaterally.  Heart:  Non-displaced PMI, chest non-tender; regular rate and rhythm, S1, S2 without murmurs, rubs or gallops. Carotid upstroke normal, no bruit.  Pedals normal pulses. No edema, no varicosities. Abdomen:  Bowel sounds positive; abdomen soft and non-tender without masses, organomegaly, or hernias noted. No hepatosplenomegaly. Extremities:  No clubbing or cyanosis. Neurologic:  Alert and oriented x 3. Psych:  Normal affect.   Impression & Recommendations:  Problem # 1:  PALPITATIONS, CHRONIC (ICD-785.1) Resolved with higher dose of Toprol XL.  Continue current management.   Problem # 2:  CAD, NATIVE VESSEL (ICD-414.01) Mild nonobstructive CAD.  I have asked the patient to take ASA 81 mg daily (he is not taking aspirin regularly).    Problem # 3:  HYPERLIPIDEMIA (ICD-272.4) Given nonobstructive CAD, would keep LDL close to 70.  Continue current meds (last LDL 80).   Other Orders: EKG w/ Interpretation (93000)  Patient Instructions: 1)  Be sure to take Aspirin 81mg  daily---this should be buffered or coated. 2)  Your physician wants you to follow-up in: 1year.  You will receive a reminder letter in the mail two months in advance. If you don't receive a letter, please call our office to schedule the follow-up appointment.     Orders Added: 1)  EKG w/ Interpretation [93000]

## 2010-07-08 NOTE — Miscellaneous (Signed)
  Clinical Lists Changes  Observations: Added new observation of ECHOINTERP: SUMMARY   -  Overall left ventricular systolic function was normal. Left         ventricular ejection fraction was estimated to be 60 %. There         were no left ventricular regional wall motion abnormalities.         There was mild focal basal septal hypertrophy. Doppler         parameters were consistent with high left ventricular filling         pressure.   -  The aortic valve was mildly calcified.   -  There was mild calcification of the mitral valve,. There was mild         to moderate mitral annular calcification.   -  The left atrium was mildly dilated.     ---------------------------------------------------------------    Prepared and Electronically Authenticated by    Nicholes Mango MD   Confirmed 12-Jul-2008 18:31:37 (07/12/2009 9:50)      Echocardiogram  Procedure date:  07/12/2009  Findings:      SUMMARY   -  Overall left ventricular systolic function was normal. Left         ventricular ejection fraction was estimated to be 60 %. There         were no left ventricular regional wall motion abnormalities.         There was mild focal basal septal hypertrophy. Doppler         parameters were consistent with high left ventricular filling         pressure.   -  The aortic valve was mildly calcified.   -  There was mild calcification of the mitral valve,. There was mild         to moderate mitral annular calcification.   -  The left atrium was mildly dilated.     ---------------------------------------------------------------    Prepared and Electronically Authenticated by    Nicholes Mango MD   Confirmed 12-Jul-2008 18:31:37

## 2010-07-08 NOTE — Letter (Signed)
Summary: male sling  f/u      [facility]  [facility]   Imported By: Lanelle Bal 11/09/2009 11:19:57  _____________________________________________________________________  External Attachment:    Type:   Image     Comment:   External Document

## 2010-07-08 NOTE — Medication Information (Signed)
Summary: Letter Regarding Meloxicam & Hypertension/[facility]  Letter Regarding Meloxicam & Hypertension/[facility]   Imported By: Lanelle Bal 01/13/2010 10:52:51  _____________________________________________________________________  External Attachment:    Type:   Image     Comment:   External Document  Appended Document: Letter Regarding Meloxicam & Hypertension/[facility] currently not on meloxicam

## 2010-07-08 NOTE — Letter (Signed)
Summary: B hernia,refered to surgery---- Urology    [facility]   Imported By: Lanelle Bal 12/30/2009 13:58:42  _____________________________________________________________________  External Attachment:    Type:   Image     Comment:   External Document

## 2010-07-08 NOTE — Progress Notes (Signed)
Summary: stop ASA**DM review**   Phone Note From Other Clinic   Caller: nurse Pattricia Boss Summary of Call: Per Pattricia Boss from [facility] pt needs hernia surgery needs to stop ASA 5 days prior to procedure. is pt ok to do this? ofc [phone] fax [phone] Initial call taken by: Edman Circle,  January 09, 2010 10:46 AM  Follow-up for Phone Call        The pt needs bilateral Inguinal Hernia repair by Dr Manus Rudd.  The pt is not scheduled for this procedure at this time.  [facility] would like cardiac clearance and clearance for the pt to hold ASA 5 days prior from Dr Shirlee Latch.  I will forward this information to Dr Shirlee Latch for review.  Follow-up by: Julieta Gutting, RN, BSN,  January 09, 2010 11:36 AM     Appended Document: stop ASA**DM review** Ok for surgery and to hold ASA pre-op as long as he has not developed any new symptoms since I last saw him.   Appended Document: stop ASA**DM review** I called pt. to see if he has had any new symptoms since seen by Dr. Shirlee Latch. Pt. states he is doing well. He has not have any new symptoms since seen in office in Feb./11. I will fax this note [facility].

## 2010-07-08 NOTE — Assessment & Plan Note (Signed)
Summary: cough/congested//kn   Vital Signs:  Patient profile:   70 year old male Weight:      132.38 pounds Temp:     97.3 degrees F oral Pulse rate:   60 / minute Pulse rhythm:   regular BP sitting:   126 / 70  (left arm) Cuff size:   regular  Vitals Entered By: Army Fossa CMA (December 20, 2009 4:12 PM) CC: Pt here had a deep cough x 3-4 days. Comments - has not tried anything OTC.   History of Present Illness: sick for 4-5 days Cough, sinus congestion, severe chest congestion and some wheezing more than usual Some pain in the anterior chest ( tracheal area) with cough wife with the same symptoms, she was in the hospital for few days, the patient has visited her several times at the hospital  ROS No fever No nausea vomiting There is little sputum, nonbloody No aches and pains  Current Medications (verified): 1)  Simvastatin 20 Mg  Tabs (Simvastatin) .Marland Kitchen.. 1 By Mouth Qhs 2)  Triamterene-Hctz 37.5-25 Mg  Caps (Triamterene-Hctz) .... Take One Tab By Mouth Daily 3)  Cartia Xt 240 Mg  Cp24 (Diltiazem Hcl Coated Beads) .... Take One Tablet Once Daily 4)  Omeprazole 20 Mg  Cpdr (Omeprazole) .Marland Kitchen.. 1 By Mouth Two Times A Day 5)  Mvi 6)  Toprol Xl 25 Mg Xr24h-Tab (Metoprolol Succinate) .... (2) in The Morning and (1) in The Evening 7)  Vesicare 5 Mg Tabs (Solifenacin Succinate) .... Once Daily 8)  Aspir-Low 81 Mg Tbec (Aspirin) .... One Tablet Daily  Allergies: 1)  ! Sudafed 2)  ! Lodine 3)  ! Sulfa  Past History:  Past Medical History:        Hyperlipidemia       Hypertension       GERD (with stricture) and a h/o PUD       Prostate cancer, hx of (11/2007)--prostatectomy 7-09...no XRT       FATIGUE, CHRONIC       ERECTILE DYSFUNCTION       NEOP, BNG, ADRENAL GLAND        PEYRONIE'S DISEASE        Depression Dx  3-09 Tobacco abuse.   Diverticulosis.  Cardiac Cath Nonobstructive coronary artery disease.  The patient's left heart catheterization done in 2005 showed  30% left main stenosis, an extensively calcified LAD with moderate plaquing, and EF was 60%.  There was no obstructive disease. h/o palpitations , PAC/PVCs.  Diastolic dysfunction: Echo (2/10) with EF 60%, mild focal basal septal hypertrophy, moderate diastolic dysfunction.    Past Surgical History: Reviewed history from 02/02/2009 and no changes required. Spinal fusion ('99, '06) herniorrhaphy Prostatectomy  Physical Exam  General:  alert, well-developed, and well-nourished.  nontoxic Head:  face symmetric, nontender to palpation Ears:  R ear normal and L ear normal.   Nose:  noncongested Mouth:  no redness or discharge Lungs:  normal respiratory effort and no intercostal retractions.  rhonchi throughout, mild wheezing bilaterally, few crackles at bases? No increased work of breathing Heart:  normal rate, regular rhythm, and no murmur.   Extremities:  no clubbing or cyanosis no lower extremity edema   Impression & Recommendations:  Problem # 1:  BRONCHITIS (ICD-490)  bronchitis versus pneumonia Patient is nontoxic Plan: Chest x-ray Start Avelox, 3 sample provided, if chest x-ray shows  pneumonia he will need 4 additional days Mucinex DM albuterol Stop -hold tobacco  His updated medication list for this problem includes:  Ventolin Hfa 108 (90 Base) Mcg/act Aers (Albuterol sulfate) .Marland Kitchen... 2 puffs  every 6 hours as needed for cough or wheezing  Orders: T-2 View CXR (71020TC)  Problem # 2:   albuterol  Complete Medication List: 1)  Simvastatin 20 Mg Tabs (Simvastatin) .Marland Kitchen.. 1 by mouth qhs 2)  Triamterene-hctz 37.5-25 Mg Caps (Triamterene-hctz) .... Take one tab by mouth daily 3)  Cartia Xt 240 Mg Cp24 (Diltiazem hcl coated beads) .... Take one tablet once daily 4)  Omeprazole 20 Mg Cpdr (Omeprazole) .Marland Kitchen.. 1 by mouth two times a day 5)  Mvi  6)  Toprol Xl 25 Mg Xr24h-tab (Metoprolol succinate) .... (2) in the morning and (1) in the evening 7)  Vesicare 5 Mg Tabs  (Solifenacin succinate) .... Once daily 8)  Aspir-low 81 Mg Tbec (Aspirin) .... One tablet daily 9)  Ventolin Hfa 108 (90 Base) Mcg/act Aers (Albuterol sulfate) .... 2 puffs  every 6 hours as needed for cough or wheezing  Patient Instructions: 1)  rest, fluids, Tylenol as needed 2)  Chest x-ray 3)  Start Avelox, one tablet a day  (samples) 4)  Mucinex DM one tablet twice a day  until cough decrease 5)  albuterol 2 puffs every 6 hours as needed for cough, chest congestion or wheezing 6)  Stop -hold tobacco 7)  ER if you feel worse, high fever, shortness of breath Prescriptions: VENTOLIN HFA 108 (90 BASE) MCG/ACT AERS (ALBUTEROL SULFATE) 2 puffs  every 6 hours as needed for cough or wheezing  #1 x 0   Entered and Authorized by:   Nolon Rod. Paz MD   Signed by:   Nolon Rod. Paz MD on 12/20/2009   Method used:   Print then Give to Patient   RxID:   [id number]

## 2010-07-08 NOTE — Progress Notes (Signed)
Summary: REFILL REQUEST  Phone Note Refill Request Call back at [phone] Message from:  Pharmacy on January 24, 2010 9:12 AM  Refills Requested: Medication #1:  TRIAMTERENE-HCTZ 37.5-25 MG  CAPS take one tab by mouth daily   Dosage confirmed as above?Dosage Confirmed   Supply Requested: 1 month   Last Refilled: 04/15/2009   Notes: The generic dyazide is not available(which is capsules). Would it be OK to switch to generic maxzide(which is tablet--same strength). Please let us know if OK. thanks Sanjuana Mae [address]  Next Appointment Scheduled: NONE Initial call taken by: Lavell Islam,  January 24, 2010 9:20 AM  Follow-up for Phone Call        ok with me Darletta Noblett E. Kayann Maj MD  January 24, 2010 11:42 AM   Additional Follow-up for Phone Call Additional follow up Details #1::        sent in to pharm. Army Fossa CMA  January 24, 2010 11:50 AM     New/Updated Medications: MAXZIDE-25 37.5-25 MG TABS (TRIAMTERENE-HCTZ) 1 by mouth daily. Prescriptions: MAXZIDE-25 37.5-25 MG TABS (TRIAMTERENE-HCTZ) 1 by mouth daily.  #30 x 1   Entered by:   Army Fossa CMA   Authorized by:   Nolon Rod. Robbin Escher MD   Signed by:   Army Fossa CMA on 01/24/2010   Method used:   Electronically to        [facility]. [address] [phone]* (retail)       [address]       [place], [place]  [address]       Ph: [phone] or [phone]       Fax: [phone]   RxID:   [id number]

## 2010-07-08 NOTE — Letter (Signed)
Summary: shoulder bursitis, Rx injection B   [facility]   Imported By: Lanelle Bal 12/17/2009 14:00:30  _____________________________________________________________________  External Attachment:    Type:   Image     Comment:   External Document

## 2010-07-08 NOTE — Letter (Signed)
Summary: [facility]  [facility]   Imported By: Lanelle Bal 09/04/2009 13:51:16  _____________________________________________________________________  External Attachment:    Type:   Image     Comment:   External Document

## 2010-07-08 NOTE — Letter (Signed)
Summary: Appointment - Reminder 2  [facility], Main Office  [address]   [place], [place] [address]   Phone: [phone]  Fax: [phone]     June 13, 2009 MRN: [id number]   [facility] Biena[facility] [address] [place], [place]  [address]   Dear Mr. PILLSBURY,  Our records indicate that it is time to schedule a follow-up appointment with Dr. Shirlee Latch. It is very important that we reach you to schedule this appointment. We look forward to participating in your health care needs. Please contact us at the number listed above at your earliest convenience to schedule your appointment.  If you are unable to make an appointment at this time, give [place] a call so we can update our records.  Sincerely,   [facility] Scheduling Team

## 2010-07-08 NOTE — Letter (Signed)
Summary: [facility]  [facility]   Imported By: Lanelle Bal 11/21/2009 08:57:00  _____________________________________________________________________  External Attachment:    Type:   Image     Comment:   External Document

## 2010-07-08 NOTE — Letter (Signed)
Summary: [facility]   Imported By: Roderic Ovens 01/16/2010 09:26:25  _____________________________________________________________________  External Attachment:    Type:   Image     Comment:   External Document

## 2010-07-08 NOTE — Letter (Signed)
Summary: [facility]  [facility]   Imported By: Lanelle Bal 06/26/2009 13:12:14  _____________________________________________________________________  External Attachment:    Type:   Image     Comment:   External Document

## 2010-07-09 ENCOUNTER — Ambulatory Visit: Admit: 2010-07-09 | Payer: Self-pay | Admitting: Internal Medicine

## 2010-07-09 ENCOUNTER — Other Ambulatory Visit: Payer: Self-pay

## 2010-07-10 NOTE — Letter (Signed)
Summary: PSA   0, incont. better w/  sling--- [facility]  [facility]   Imported By: Lanelle Bal 05/29/2010 11:30:39  _____________________________________________________________________  External Attachment:    Type:   Image     Comment:   External Document

## 2010-07-10 NOTE — Letter (Signed)
Summary: [facility]  [facility]   Imported By: Lanelle Bal 06/11/2010 10:11:47  _____________________________________________________________________  External Attachment:    Type:   Image     Comment:   External Document

## 2010-07-10 NOTE — Progress Notes (Signed)
Summary: due cscope   Phone Note Outgoing Call   Summary of Call: advise patient: per GI he is due for [place], please arrange if patient is willing to go [place] E. Yasmina Chico MD  June 22, 2010 2:45 PM   Follow-up for Phone Call        I spoke w/ pt he states he will be willing to go, will arrange. Army Fossa CMA  June 23, 2010 1:49 PM

## 2010-07-10 NOTE — Letter (Signed)
Summary: Pre Visit Letter Revised  [facility]  [address] [place], [place] [address]   Phone: [phone]  Fax: [phone]        06/25/2010 MRN: [id number]  [facility] [address] [place], [place]  [address]             Procedure Date:  07-30-10 11am           Dr Russella Dar  - Recall Colon   Welcome to the Gastroenterology Division at [facility].    You are scheduled to see a nurse for your pre-procedure visit on 07-16-10 at 10am on the 3rd floor at [facility], [address]  We ask that you try to arrive at our office 15 minutes prior to your appointment time to allow for check-in.  Please take a minute to review the attached form.  If you answer "Yes" to one or more of the questions on the first page, we ask that you call the person listed at your earliest opportunity.  If you answer "No" to all of the questions, please complete the rest of the form and bring it to your appointment.    Your nurse visit will consist of discussing your medical and surgical history, your immediate family medical history, and your medications.   If you are unable to list all of your medications on the form, please bring the medication bottles to your appointment and we will list them.  We will need to be aware of both prescribed and over the counter drugs.  We will need to know exact dosage information as well.    Please be prepared to read and sign documents such as consent forms, a financial agreement, and acknowledgement forms.  If necessary, and with your consent, a friend or relative is welcome to sit-in on the nurse visit with you.  Please bring your insurance card so that we may make a copy of it.  If your insurance requires a referral to see a specialist, please bring your referral form from your primary care physician.  No co-pay is required for this nurse visit.     If you cannot keep your appointment, please call [phone] to cancel or reschedule prior  to your appointment date.  This allows [place] the opportunity to schedule an appointment for another patient in need of care.    Thank you for choosing  Gastroenterology for your medical needs.  We appreciate the opportunity to care for you.  Please visit [place] at our website  to learn more about our practice.  Sincerely, The Gastroenterology Division

## 2010-07-10 NOTE — Assessment & Plan Note (Signed)
Summary: cpx//lch   Vital Signs:  Patient profile:   71 year old male Height:      66 inches Weight:      133 pounds BMI:     21.54 Pulse rate:   58 / minute Pulse rhythm:   regular BP sitting:   124 / 78  (left arm) Cuff size:   regular  Vitals Entered By: Army Fossa CMA (June 18, 2010 1:23 PM) CC: cpx,not fasting  Comments no complaints [facility] [address]    History of Present Illness: Here for Medicare AWV: 1.   Risk factors based on Past M, S, F history: reviewed 2.   Physical Activities: very active at home, walks dog daily  3.   Depression/mood: occasionally gets depress , wife has a # of medical issues, has some family                 problems , overall doesn't thinnks he needs medicine  4.   Hearing: no problems reported or noted to normal conversation  5.   ADL's: independent  6.   Fall Risk: no recent falls  7.   Home Safety: does feel safe at home  8.   Height, weight, &visual acuity: seee VS, saw an eye doctor within a year , got ne glasses  9.   Counseling: yes  10.   Labs ordered based on risk factors: yes 11.           Referral Coordination, if needed  12.           Care Plan, see a/p 13.            Cognitive Assessment: motor skills, cognition and memory seemed normal  in addition, we discussed the following Hyperlipidemia -- good medication compliance    Hypertension-- ambulatory BPs wnl  Prostate cancer, saw urology 12/11, PSA close to zero, incontinence better with a sling Tobacco abuse-- still smoking. He has a lot of stress in his life right now and he states he is not  ready to quit    Preventive Screening-Counseling & Management  Alcohol-Tobacco     Packs/Day: 1  Caffeine-Diet-Exercise     Does Patient Exercise: yes  Current Medications (verified): 1)  Simvastatin 20 Mg  Tabs (Simvastatin) .Marland Kitchen.. 1 By Mouth Qhs 2)  Triamterene-Hctz 37.5-25 Mg  Caps (Triamterene-Hctz) .... Take One Tab By Mouth Daily. Due For Office Visit. 3)   Cartia Xt 240 Mg  Cp24 (Diltiazem Hcl Coated Beads) .... Take One Tablet Once Daily 4)  Omeprazole 20 Mg  Cpdr (Omeprazole) .Marland Kitchen.. 1 By Mouth Two Times A Day 5)  Mvi 6)  Toprol Xl 25 Mg Xr24h-Tab (Metoprolol Succinate) .... (2) in The Morning and (1) in The Evening. Due For Office Visit Before Additional Refills. 7)  Aspir-Low 81 Mg Tbec (Aspirin) .... One Tablet Daily 8)  Maxzide-25 37.5-25 Mg Tabs (Triamterene-Hctz) .Marland Kitchen.. 1 By Mouth Daily.  Allergies (verified): 1)  ! Sudafed 2)  ! Lodine 3)  ! Sulfa  Past History:  Past Medical History: Reviewed history from 12/20/2009 and no changes required.        Hyperlipidemia       Hypertension       GERD (with stricture) and a h/o PUD       Prostate cancer, hx of (11/2007)--prostatectomy 7-09...no XRT       FATIGUE, CHRONIC       ERECTILE DYSFUNCTION       NEOP, BNG, ADRENAL GLAND  PEYRONIE'S DISEASE        Depression Dx  3-09 Tobacco abuse.   Diverticulosis.  Cardiac Cath Nonobstructive coronary artery disease.  The patient's left heart catheterization done in 2005 showed 30% left main stenosis, an extensively calcified LAD with moderate plaquing, and EF was 60%.  There was no obstructive disease. h/o palpitations , PAC/PVCs.  Diastolic dysfunction: Echo (2/10) with EF 60%, mild focal basal septal hypertrophy, moderate diastolic dysfunction.    Past Surgical History: Spinal fusion ('99, '06) herniorrhaphy Prostatectomy B hernia repair 10-11   Family History: colon ca--aunt prostate ca--no MI--F at age 32 DM-- no  Social History: Married, wife is disabled 2 kids fully retired, thinking about goung back to work Patient currently smokes 1ppd Alcohol Use - no Illicit Drug Use - no Patient does not get regular exercise but is active  Packs/Day:  1 Does Patient Exercise:  yes  Review of Systems CV:  Denies chest pain or discomfort and swelling of feet. Resp:  Denies cough and wheezing. GI:  Denies bloody stools,  nausea, and vomiting. GU:  Denies dysuria and hematuria.  Physical Exam  General:  alert, well-developed, and well-nourished.   Neck:  full ROM, no thyromegaly, and normal carotid upstroke.   Lungs:  normal respiratory effort, no intercostal retractions, no accessory muscle use, and normal breath sounds.   Heart:  normal rate, regular rhythm, and no murmur.   Abdomen:  soft, non-tender, no distention, and no masses.  no bruit. Extremities:  no pretibial edema bilaterally  Psych:  Cognition and judgment appear intact. Alert and cooperative with normal attention span and concentration. not anxious appearing and not depressed appearing.     Impression & Recommendations:  Problem # 1:  HEALTH SCREENING (ICD-V70.0)  Td 2004 pneumonia shot 2009 shingles shot benefits discussed, Rx provided  recommend to get a flu shot  at the pharmacy, we ran out    Cscope 2002--no polyps + FH colon ca (aunt had colon ca) flex sig 10-09 done for diarrhea ?next scope----Will ask GI   PSA per urology  tobacco abuse, patient declined counseling, "I am not ready to quit " Diet exercise discussed  Orders: Medicare -1st Annual Wellness Visit [phone])  Problem # 2:  CAD, NATIVE VESSEL (ICD-414.01) asymptomatic His updated medication list for this problem includes:    Triamterene-hctz 37.5-25 Mg Caps (Triamterene-hctz) .Marland Kitchen... Take one tab by mouth daily. due for office visit.    Cartia Xt 240 Mg Cp24 (Diltiazem hcl coated beads) .Marland Kitchen... Take one tablet once daily    Maxzide-25 37.5-25 Mg Tabs (Triamterene-hctz) .Marland Kitchen... 1 by mouth daily.    Toprol Xl 25 Mg Xr24h-tab (Metoprolol succinate) ..... (2) in the morning and (1) in the evening. due for office visit before additional refills.    Aspir-low 81 Mg Tbec (Aspirin) ..... One tablet daily  Problem # 3:  DEPRESSION (ICD-311) still depressed to some extent, declines medication  Problem # 4:  HYPERLIPIDEMIA (ICD-272.4) RF, labs His updated medication list  for this problem includes:    Simvastatin 20 Mg Tabs (Simvastatin) .Marland Kitchen... 1 by mouth qhs  Labs Reviewed: SGOT: 26 (04/12/2009)   SGPT: 38 (04/12/2009)   HDL:42.70 (04/12/2009), 39.1 (11/28/2007)  LDL:80 (04/12/2009), 70 (11/28/2007)  Chol:159 (04/12/2009), 130 (11/28/2007)  Trig:181.0 (04/12/2009), 104 (11/28/2007)  Problem # 5:  HYPERTENSION (ICD-401.9) RF, labs His updated medication list for this problem includes:    Triamterene-hctz 37.5-25 Mg Caps (Triamterene-hctz) .Marland Kitchen... Take one tab by mouth daily. due for office visit.  Cartia Xt 240 Mg Cp24 (Diltiazem hcl coated beads) .Marland Kitchen... Take one tablet once daily    Maxzide-25 37.5-25 Mg Tabs (Triamterene-hctz) .Marland Kitchen... 1 by mouth daily.    Toprol Xl 25 Mg Xr24h-tab (Metoprolol succinate) ..... (2) in the morning and (1) in the evening. due for office visit before additional refills.  BP today: 124/78 Prior BP: 126/70 (12/20/2009)  Labs Reviewed: K+: 4.9 (04/12/2009) Creat: : 0.9 (04/12/2009)   Chol: 159 (04/12/2009)   HDL: 42.70 (04/12/2009)   LDL: 80 (04/12/2009)   TG: 181.0 (04/12/2009)  Complete Medication List: 1)  Simvastatin 20 Mg Tabs (Simvastatin) .Marland Kitchen.. 1 by mouth qhs 2)  Triamterene-hctz 37.5-25 Mg Caps (Triamterene-hctz) .... Take one tab by mouth daily. due for office visit. 3)  Cartia Xt 240 Mg Cp24 (Diltiazem hcl coated beads) .... Take one tablet once daily 4)  Maxzide-25 37.5-25 Mg Tabs (Triamterene-hctz) .Marland Kitchen.. 1 by mouth daily. 5)  Toprol Xl 25 Mg Xr24h-tab (Metoprolol succinate) .... (2) in the morning and (1) in the evening. due for office visit before additional refills. 6)  Omeprazole 20 Mg Cpdr (Omeprazole) .Marland Kitchen.. 1 by mouth two times a day 7)  Mvi  8)  Aspir-low 81 Mg Tbec (Aspirin) .... One tablet daily 9)  Incontinence Medicine Per Urology  10)  Zostavax 52841 Unt/0.32ml Solr (Zoster vaccine live) .... One  Patient Instructions: 1)  please come back fasting 2)  BMP CBC---- dx hypertension 3)  FLP, AST, ALT------  dx  high cholesterol 4)  Please schedule a follow-up appointment in 6 months .  Prescriptions: ZOSTAVAX 32440 UNT/0.65ML SOLR (ZOSTER VACCINE LIVE) one  #1 x 0   Entered and Authorized by:   Nolon Rod. Muranda Coye MD   Signed by:   Nolon Rod. Tahirah Sara MD on 06/18/2010   Method used:   Print then Give to Patient   RxID:   [id number] MAXZIDE-25 37.5-25 MG TABS (TRIAMTERENE-HCTZ) 1 by mouth daily.  #90 x 2   Entered by:   Army Fossa CMA   Authorized by:   Nolon Rod. Ashauna Bertholf MD   Signed by:   Army Fossa CMA on 06/18/2010   Method used:   Print then Give to Patient   RxID:   [id number] TOPROL XL 25 MG XR24H-TAB (METOPROLOL SUCCINATE) (2) in the morning and (1) in the evening. DUE FOR OFFICE VISIT BEFORE ADDITIONAL REFILLS.  #90 Tablet x 2   Entered by:   Army Fossa CMA   Authorized by:   Nolon Rod. Clell Trahan MD   Signed by:   Army Fossa CMA on 06/18/2010   Method used:   Print then Give to Patient   RxID:   [id number] OMEPRAZOLE 20 MG  CPDR (OMEPRAZOLE) 1 by mouth two times a day  #180 x 2   Entered by:   Army Fossa CMA   Authorized by:   Nolon Rod. Saniyah Mondesir MD   Signed by:   Army Fossa CMA on 06/18/2010   Method used:   Print then Give to Patient   RxID:   [id number] CARTIA XT 240 MG  CP24 (DILTIAZEM HCL COATED BEADS) take one tablet once daily  #90 x 2   Entered by:   Army Fossa CMA   Authorized by:   Nolon Rod. Landrie Beale MD   Signed by:   Army Fossa CMA on 06/18/2010   Method used:   Print then Give to Patient   RxID:   [id number] TRIAMTERENE-HCTZ 37.5-25 MG  CAPS (TRIAMTERENE-HCTZ) take one tab by mouth daily. DUE FOR  OFFICE VISIT.  #90 x 2   Entered by:   Army Fossa CMA   Authorized by:   Nolon Rod. Demia Viera MD   Signed by:   Army Fossa CMA on 06/18/2010   Method used:   Print then Give to Patient   RxID:   [id number] SIMVASTATIN 20 MG  TABS (SIMVASTATIN) 1 by mouth qhs  #90 Tablet x 2   Entered by:   Army Fossa CMA   Authorized by:   Nolon Rod. Kashia Brossard  MD   Signed by:   Army Fossa CMA on 06/18/2010   Method used:   Print then Give to Patient   RxID:   [id number]    Orders Added: 1)  Est. Patient Level III [66440] 2)  Medicare -1st Annual Wellness Visit [G0438]     Risk Factors:  Exercise:  yes

## 2010-07-10 NOTE — Progress Notes (Signed)
Summary: CPX  due  Phone Note Outgoing Call   Call placed by: Army Fossa CMA,  June 11, 2010 11:54 AM Summary of Call: Please call pt and have pt schedule a CPX. Army Fossa CMA  June 11, 2010 11:54 AM   Follow-up for Phone Call        Patient has an appt on 1.11.12 @ 1:20pm..[facility]  June 11, 2010 11:57 AM

## 2010-07-15 ENCOUNTER — Encounter (INDEPENDENT_AMBULATORY_CARE_PROVIDER_SITE_OTHER): Payer: Self-pay | Admitting: *Deleted

## 2010-07-16 ENCOUNTER — Encounter: Payer: Self-pay | Admitting: Gastroenterology

## 2010-07-21 ENCOUNTER — Other Ambulatory Visit: Payer: Self-pay | Admitting: Internal Medicine

## 2010-07-21 ENCOUNTER — Encounter (INDEPENDENT_AMBULATORY_CARE_PROVIDER_SITE_OTHER): Payer: Self-pay | Admitting: *Deleted

## 2010-07-21 ENCOUNTER — Other Ambulatory Visit (INDEPENDENT_AMBULATORY_CARE_PROVIDER_SITE_OTHER): Payer: Medicare Other

## 2010-07-21 DIAGNOSIS — I10 Essential (primary) hypertension: Secondary | ICD-10-CM

## 2010-07-21 DIAGNOSIS — E785 Hyperlipidemia, unspecified: Secondary | ICD-10-CM

## 2010-07-21 LAB — BASIC METABOLIC PANEL
BUN: 20 mg/dL (ref 6–23)
Creatinine, Ser: 0.8 mg/dL (ref 0.4–1.5)
GFR: 98.51 mL/min (ref >60.00)
Glucose, Bld: 79 mg/dL (ref 70–99)
Potassium: 4.1 mEq/L (ref 3.5–5.1)

## 2010-07-21 LAB — CBC WITH DIFFERENTIAL/PLATELET
Basophils Relative: 0.8 % (ref 0.0–3.0)
Eosinophils Absolute: 0.5 10*3/uL (ref 0.0–0.7)
Eosinophils Relative: 5 % (ref 0.0–5.0)
HCT: 43.1 % (ref 39.0–52.0)
Hemoglobin: 15 g/dL (ref 13.0–17.0)
Lymphs Abs: 1.7 10*3/uL (ref 0.7–4.0)
MCHC: 34.9 g/dL (ref 30.0–36.0)
MCV: 94.8 fl (ref 78.0–100.0)
Monocytes Absolute: 0.5 10*3/uL (ref 0.1–1.0)
Neutro Abs: 7.7 10*3/uL (ref 1.4–7.7)
Neutrophils Relative %: 72.7 % (ref 43.0–77.0)
RBC: 4.54 Mil/uL (ref 4.22–5.81)
WBC: 10.5 10*3/uL (ref 4.5–10.5)

## 2010-07-21 LAB — ALT: ALT: 35 U/L (ref 0–53)

## 2010-07-24 NOTE — Letter (Signed)
Summary: [facility] Instructions  [facility]  [address] [place], [place] [address]   Phone: [phone]  Fax: [phone]       [name]    71/15/1942    MRN: [id number]        Procedure Day [name]:  [facility]  07/30/10     Arrival Time: 10:00am     Procedure Time: 11:00am     Location of Procedure:                    [name] _  [facility] (4th Floor)                       PREPARATION FOR COLONOSCOPY WITH MOVIPREP   Starting 5 days prior to your procedure  FRIDAY 02/17  do not eat nuts, seeds, popcorn, corn, beans, peas,  salads, or any raw vegetables.  Do not take any fiber supplements (e.g. Metamucil, Citrucel, and Benefiber).  THE DAY BEFORE YOUR PROCEDURE         DATE: TUESDAY  02/21  1.  Drink clear liquids the entire day-NO SOLID FOOD  2.  Do not drink anything colored red or purple.  Avoid juices with pulp.  No orange juice.  3.  Drink at least 64 oz. (8 glasses) of fluid/clear liquids during the day to prevent dehydration and help the prep work efficiently.  CLEAR LIQUIDS INCLUDE: Water Jello Ice Popsicles Tea (sugar ok, no milk/cream) Powdered fruit flavored drinks Coffee (sugar ok, no milk/cream) Gatorade Juice: apple, white grape, white cranberry  Lemonade Clear bullion, consomm, broth Carbonated beverages (any kind) Strained chicken noodle soup Hard Candy                             4.  In the morning, mix first dose of MoviPrep solution:    Empty 1 Pouch A and 1 Pouch B into the disposable container    Add lukewarm drinking water to the top line of the container. Mix to dissolve    Refrigerate (mixed solution should be used within 24 hrs)  5.  Begin drinking the prep at 5:00 p.m. The MoviPrep container is divided by 4 marks.   Every 15 minutes drink the solution down to the next mark (approximately 8 oz) until the full liter is complete.   6.  Follow completed prep with 16 oz of clear liquid of your choice (Nothing  red or purple).  Continue to drink clear liquids until bedtime.  7.  Before going to bed, mix second dose of MoviPrep solution:    Empty 1 Pouch A and 1 Pouch B into the disposable container    Add lukewarm drinking water to the top line of the container. Mix to dissolve    Refrigerate  THE DAY OF YOUR PROCEDURE      DATE: [facility]  02/22  Beginning at  6:00a.m. (5 hours before procedure):         1. Every 15 minutes, drink the solution down to the next mark (approx 8 oz) until the full liter is complete.  2. Follow completed prep with 16 oz. of clear liquid of your choice.    3. You may drink clear liquids until 9:00am  (2 HOURS BEFORE PROCEDURE).   MEDICATION INSTRUCTIONS  Unless otherwise instructed, you should take regular prescription medications with a small sip of water   as early as possible the morning  of your procedure.         OTHER INSTRUCTIONS  You will need a responsible adult at least 71 years of age to accompany you and drive you home.   This person must remain in the waiting room during your procedure.  Wear loose fitting clothing that is easily removed.  Leave jewelry and other valuables at home.  However, you may wish to bring a book to read or  an iPod/MP3 player to listen to music as you wait for your procedure to start.  Remove all body piercing jewelry and leave at home.  Total time from sign-in until discharge is approximately 2-3 hours.  You should go home directly after your procedure and rest.  You can resume normal activities the  day after your procedure.  The day of your procedure you should not:   Drive   Make legal decisions   Operate machinery   Drink alcohol   Return to work  You will receive specific instructions about eating, activities and medications before you leave.    The above instructions have been reviewed and explained to me by   Karl Bales RN  July 16, 2010 10:29 AM    I fully understand and  can verbalize these instructions _____________________________ Date _________

## 2010-07-24 NOTE — Miscellaneous (Signed)
Summary: [facility] previsit  Clinical Lists Changes  Medications: Added new medication of MOVIPREP 100 GM  SOLR (PEG-KCL-NACL-NASULF-NA ASC-C) As per prep instructions. - Signed Rx of MOVIPREP 100 GM  SOLR (PEG-KCL-NACL-NASULF-NA ASC-C) As per prep instructions.;  #1 x 0;  Signed;  Entered by: Karl Bales RN;  Authorized by: Meryl Dare MD [facility];  Method used: Electronically to [employer] [facility]. [place]. [phone]*, [address], [place], [place], [place]  [address], Ph: [phone] or [phone], Fax: [phone]    Prescriptions: MOVIPREP 100 GM  SOLR (PEG-KCL-NACL-NASULF-NA ASC-C) As per prep instructions.  #1 x 0   Entered by:   Karl Bales RN   Authorized by:   Meryl Dare MD [facility]   Signed by:   Karl Bales RN on 07/16/2010   Method used:   Electronically to        [facility]. [address] [phone]* (retail)       [address]       [place], [place]  [address]       Ph: [phone] or [phone]       Fax: [phone]   RxID:   [id number]   Appended Document: [facility] previsit Upon reviewing colonoscopy report from 10-2006, pt does not need a colonoscopy for 10 years per Dr. Corinda Gubler.  All of pt's records reviewed with Dr. Russella Dar.  Pt has not had polyps removed in the past and is currently having no GI problems.  Pt has hx of a maternal aunt with colon CA.  Per Dr. Russella Dar, pt does not need colonoscopy until 2018.  Pt informed of this and recall for 2018 put into EPIC.  Appended Document: [facility] previsit Recall for 10-2016 colonoscopy number is [phone]

## 2010-07-30 ENCOUNTER — Other Ambulatory Visit: Payer: Self-pay | Admitting: Gastroenterology

## 2010-08-04 ENCOUNTER — Encounter: Payer: Self-pay | Admitting: Internal Medicine

## 2010-08-19 NOTE — Letter (Signed)
Summary: [facility]  [facility]   Imported By: Maryln Gottron 08/07/2010 14:46:16  _____________________________________________________________________  External Attachment:    Type:   Image     Comment:   External Document

## 2010-08-21 LAB — BASIC METABOLIC PANEL
BUN: 18 mg/dL (ref 6–23)
CO2: 30 mEq/L (ref 19–32)
Calcium: 9.8 mg/dL (ref 8.4–10.5)
GFR calc non Af Amer: >60 mL/min (ref >60)
Glucose, Bld: 90 mg/dL (ref 70–99)
Potassium: 4 mEq/L (ref 3.5–5.1)
Sodium: 140 mEq/L (ref 135–145)

## 2010-08-21 LAB — DIFFERENTIAL
Basophils Absolute: 0 10*3/uL (ref 0.0–0.1)
Eosinophils Absolute: 0.3 10*3/uL (ref 0.0–0.7)
Eosinophils Relative: 4 % (ref 0–5)
Lymphocytes Relative: 20 % (ref 12–46)

## 2010-08-21 LAB — CBC
MCV: 95.3 fL (ref 78.0–100.0)
Platelets: 302 10*3/uL (ref 150–400)
RDW: 13.1 % (ref 11.5–15.5)
WBC: 7.2 10*3/uL (ref 4.0–10.5)

## 2010-08-21 LAB — SURGICAL PCR SCREEN: Staphylococcus aureus: NEGATIVE

## 2010-08-25 LAB — CBC
HCT: 36.6 % — ABNORMAL LOW (ref 39.0–52.0)
Hemoglobin: 12.4 g/dL — ABNORMAL LOW (ref 13.0–17.0)
MCHC: 34 g/dL (ref 30.0–36.0)
MCV: 95.9 fL (ref 78.0–100.0)
RDW: 12.6 % (ref 11.5–15.5)

## 2010-08-25 LAB — TYPE AND SCREEN: Antibody Screen: NEGATIVE

## 2010-08-26 LAB — PROTIME-INR
INR: 1.15 (ref 0.00–1.49)
Prothrombin Time: 14.6 seconds (ref 11.6–15.2)

## 2010-08-26 LAB — CBC
MCHC: 33.8 g/dL (ref 30.0–36.0)
Platelets: 255 10*3/uL (ref 150–400)
RDW: 12.7 % (ref 11.5–15.5)

## 2010-08-31 LAB — URINALYSIS, ROUTINE W REFLEX MICROSCOPIC
Bilirubin Urine: NEGATIVE
Ketones, ur: NEGATIVE mg/dL
Nitrite: NEGATIVE
Nitrite: NEGATIVE
Specific Gravity, Urine: 1.011 (ref 1.005–1.030)
Specific Gravity, Urine: 1.015 (ref 1.005–1.030)
Urobilinogen, UA: 0.2 mg/dL (ref 0.0–1.0)
Urobilinogen, UA: 0.2 mg/dL (ref 0.0–1.0)
pH: 5 (ref 5.0–8.0)
pH: 6 (ref 5.0–8.0)

## 2010-08-31 LAB — DIFFERENTIAL
Basophils Absolute: 0 10*3/uL (ref 0.0–0.1)
Basophils Absolute: 0 10*3/uL (ref 0.0–0.1)
Basophils Relative: 0 % (ref 0–1)
Basophils Relative: 0 % (ref 0–1)
Eosinophils Relative: 3 % (ref 0–5)
Lymphocytes Relative: 12 % (ref 12–46)
Lymphocytes Relative: 14 % (ref 12–46)
Neutro Abs: 8.1 10*3/uL — ABNORMAL HIGH (ref 1.7–7.7)
Neutro Abs: 9.3 10*3/uL — ABNORMAL HIGH (ref 1.7–7.7)
Neutrophils Relative %: 80 % — ABNORMAL HIGH (ref 43–77)

## 2010-08-31 LAB — CBC
Hemoglobin: 14.6 g/dL (ref 13.0–17.0)
MCHC: 32.7 g/dL (ref 30.0–36.0)
MCHC: 32.7 g/dL (ref 30.0–36.0)
Platelets: 260 10*3/uL (ref 150–400)
Platelets: 263 10*3/uL (ref 150–400)
RDW: 13.1 % (ref 11.5–15.5)
RDW: 13.5 % (ref 11.5–15.5)

## 2010-08-31 LAB — BASIC METABOLIC PANEL
BUN: 11 mg/dL (ref 6–23)
BUN: 16 mg/dL (ref 6–23)
CO2: 26 mEq/L (ref 19–32)
Calcium: 9.1 mg/dL (ref 8.4–10.5)
Calcium: 9.5 mg/dL (ref 8.4–10.5)
Creatinine, Ser: 0.78 mg/dL (ref 0.4–1.5)
GFR calc non Af Amer: >60 mL/min (ref >60)
GFR calc non Af Amer: >60 mL/min (ref >60)
Glucose, Bld: 89 mg/dL (ref 70–99)
Glucose, Bld: 89 mg/dL (ref 70–99)
Sodium: 142 mEq/L (ref 135–145)

## 2010-09-09 LAB — DIFFERENTIAL
Eosinophils Relative: 0 % (ref 0–5)
Lymphocytes Relative: 5 % — ABNORMAL LOW (ref 12–46)
Lymphs Abs: 0.7 10*3/uL (ref 0.7–4.0)
Monocytes Absolute: 0.5 10*3/uL (ref 0.1–1.0)
Monocytes Relative: 4 % (ref 3–12)
Neutro Abs: 13.4 10*3/uL — ABNORMAL HIGH (ref 1.7–7.7)

## 2010-09-09 LAB — BASIC METABOLIC PANEL
CO2: 27 mEq/L (ref 19–32)
Calcium: 9.5 mg/dL (ref 8.4–10.5)
GFR calc Af Amer: >60 mL/min (ref >60)
GFR calc non Af Amer: >60 mL/min (ref >60)
Potassium: 3.2 mEq/L — ABNORMAL LOW (ref 3.5–5.1)
Sodium: 140 mEq/L (ref 135–145)

## 2010-09-09 LAB — URINALYSIS, ROUTINE W REFLEX MICROSCOPIC
Hgb urine dipstick: NEGATIVE
Nitrite: NEGATIVE
Protein, ur: 30 mg/dL — AB
Specific Gravity, Urine: 1.025 (ref 1.005–1.030)
Urobilinogen, UA: 0.2 mg/dL (ref 0.0–1.0)

## 2010-09-09 LAB — CBC
HCT: 43.3 % (ref 39.0–52.0)
Hemoglobin: 14.6 g/dL (ref 13.0–17.0)
RBC: 4.45 MIL/uL (ref 4.22–5.81)

## 2010-09-09 LAB — URINE MICROSCOPIC-ADD ON

## 2010-09-09 LAB — GLUCOSE, CAPILLARY: Glucose-Capillary: 149 mg/dL — ABNORMAL HIGH (ref 70–99)

## 2010-10-04 ENCOUNTER — Other Ambulatory Visit: Payer: Self-pay | Admitting: Internal Medicine

## 2010-10-21 NOTE — Op Note (Signed)
[name], [name]                  ACCOUNT NO.:  [id number]   MEDICAL RECORD NO.:  [id number]          PATIENT TYPE:  INP   LOCATION:  0001                         FACILITY:  [facility]   PHYSICIAN:  Excell Seltzer. Annabell Howells, M.D.    DATE OF BIRTH:  [birth year]   DATE OF PROCEDURE:  12/29/2007  DATE OF DISCHARGE:                               OPERATIVE REPORT   PREOPERATIVE DIAGNOSIS:  Clinically localized adenocarcinoma of the  prostate, Gleason 3 plus 4 equals 7.   POSTOPERATIVE DIAGNOSIS:  Clinically localized adenocarcinoma of the  prostate, Gleason 3 plus 4 equals 7.   PROCEDURE:  1. Attempted laparoscopic robotic assisted converted to radical      retropubic prostatectomy, bilateral nerve-sparing.  2. Bilateral pelvic lymph node dissection.   ATTENDING PHYSICIAN:  Excell Seltzer. Annabell Howells, M.D.   RESIDENT PHYSICIAN:  Dr. Delman Kitten.   ANESTHESIA:  General plus local.   INDICATIONS FOR PROCEDURE:  [name] is a 70 year old white male who was  diagnosed to have clinically localized adenocarcinoma of the prostate,  Gleason 3 plus 4 equals 7 in the left mid lateral biopsy specimen.  He  also has a history of obstructive voiding symptoms, PSA in excess of 14  as well as erectile dysfunction and Peyronie disease.  When discussion  was undertaken preoperatively all risks, benefits and consequences were  addressed and informed consent was obtained.   PROCEDURE IN DETAIL:  The patient was brought to the operating room and  placed in a supine position.  He was correctly identified by his  wristband and appropriate time-out was taken.  IV antibiotics were  administered and general anesthesia was delivered.  Once adequately  anesthetized he was placed in dorsal lithotomy position.  Great care  taken to minimize the risk of peripheral neuropathy or compartment  syndrome.  He was padded and secured to the bed that would allow for  stable transition into steep Trendelenburg position.  The bed was then  flattened and his abdomen, pelvis and perineum were clipped, prepped and  draped in normal sterile fashion.  We began our procedure by placing a  20 French Foley catheter per urethra into his bladder, draining his  bladder and capping the catheter.  We then placed our 12 mm camera port  to the left of his umbilicus using the Hasson technique.  His abdominal  wall was very thin and lax.  We established pneumoperitoneum to 20 mmHg.  We advanced our camera in and performed laparoscopy.  There were no  significant adhesions.  There was no injury from our port placement.  We  then made the appropriate measurements then marked off our port sites in  the skin.  We locally anesthetized all these with bupivacaine under  direct vision.  We then placed all of our ports under direct  laparoscopic vision.  This included three 8 mm robotic assistant ports,  a 12 mm right-sided assistant port and a 5 mm assistant port on the  right for suction.  We then docked the robot and placed all of our  instruments.  At this  point we appreciated the vision afforded by the  robot was obscured.  We trouble shooted many options and contacted the  intuitive representative and despite our attempts to rectify the  situation our visualization was still suboptimal and at this point we  elected to convert to an open procedure.   Likewise we undocked the robot and backed it away from the table, placed  the patient back in the supine position and removed the right-sided  assistant port and closed the fascia using the [employer] device  and 2-0 Vicryl.  We subsequently removed all of our ports.  We closed  the camera port fascia using direct vision with a running suture using 2-  0 Vicryl as well.  We then made a low midline suprapubic incision.  We  bluntly dissected through his adipose tissue and addressed all bleeders  with Bovie electrocautery.  We entered the rectus sheath in the midline  sharply and carried our  incision cranially.  We bluntly separated the  rectus muscles in the midline atraumatically.  He had a diminutive  pyramidalis muscle.  We carried our incision down to the level of the  pubic bone.  We then swept all the tissues off of the anterior and  lateral surfaces of the bladder.  We carried our blunt dissection down  to expose both our lateral pelvic sidewalls and pelvic lymph node  packets.  We did our bilateral pelvic lymphadenectomy, the lymph node  dissection starting on the patient's right.  We located the obturator  nerve as well as the external iliac vein.  We took our packet down all  the way lateral to the pelvic sidewall.  Lymph channels were clipped.  this was repeated on the left.  Our packets were passed off individually  and sent to pathology.  We then turned our attention to his  prostatectomy.   Once we had exposed the anterior aspect of the bladder wall we sharp and  bluntly dissected down both sides to bilateral endoscopic fascia.  We  scored the endopelvic fascia on both sides lateral to the prostate  sharply and then using blunt dissection were able to develop this plane  between the prostate and levator muscles.  We swept the prostate off of  the levators and extended our blunt dissection towards his apex on both  sides.  We then placed two figure-of-eight sutures in the midline over  the prostate to prevent any dorsal venous complex back bleeding.  We  then developed our planes both medially both to the right and left of  our dorsal vein and urethra bluntly.  We then divided our dorsal venous  complex using the GIA stapler.  There was minimal bleeding from this.  We then developed our apical dissection by taking down both  puboprostatic ligaments at the level of pubic bone and sweeping tissues  cranially.  We then placed our Bookwalter retractor.  We then further  developed apical margin over the prostate using Bovie electrocautery and  blunt dissection.  We  then divided the anterior aspect of the urethra  down to the level of the Foley catheter.  We then placed a Kelly over  the Foley catheter and cut it and internalized the external aspect of  the catheter and discarded the hub end of it.  We then were able to use  this retracted anteriorly to develop our posterior plane.  We then  divided the posterior aspect of the urethra.  This allowed [place] to get  to  our posterior plane.  While elevating the prostate off of Denonvilliers  fascia we used a combination of sharp and blunt dissection to develop  this plane.  We then performed our bilateral nerve sparing procedure  starting on the right.  We sharply divided the external prostatic fascia  sharply with scissors.  We then used right angle forceps to elevate this  fascia off of the prostate and advanced in a cranial fashion using sharp  dissection.  We carried this up to the level of its vascular pedicle.  We repeated the same procedure on the left.  We then began to develop  our posterior plane sharply, identifying the space between Denonvilliers  fascia and the posterior structures.  At one point we did enter  Denonvilliers fascia and there was no obvious rectal injury and we over  sewed this with 2-0 Vicryl.  We then developed a posterior plane bluntly  identifying both seminal vesicles.  The right was significantly larger  than the left.  Once we got down to the level of the ampulla on both  sides we elected to turn our attention to both vascular pedicles.  We  used a combination of blunt dissection with the right angle retractor  and forceps and were able to use multiple hemoclips on both pedicles  dividing them with Bovie electrocautery.  Once vascular control had been  achieved and the posterior plane had been sufficiently developed we then  moved to develop our bladder neck.   We used Bovie electrocautery to develop this plane after the patient had  been given indigo carmine.  Once the  catheter was visualized blue urine  came from the bladder neck.  We then let the balloon down on the  catheter and backed it out and then brought the catheter through the  bladder neck defect and at this point hammocked the prostate in between  it.  This allowed [place] to further develop both lateral aspects of our  bladder neck dissection and carry this down into our posterior  dissection meeting our prior posterior dissection and subsequently  finishing our prostatectomy.  We removed the prostate gland, seminal  vesicles and distal vas deferens all en bloc.  We closely inspected our  bladder neck and it was of sufficient size without any significant  reconstruction.  Both ureteral orifices were identified effluxing blue  urine.  We appeared to have good margin at the level of the bladder neck  as well.  We subsequently irrigated his pelvis.  We used our rectal  catheter to insufflate and there was no evidence of a rectal injury here  either.  Likewise we then turned our attention to our vesicourethral  anastomosis.  The mucosa was everted with 4-0 chromic stitches and the  posterior bladder neck was tightened with a 2-0 chromic stitch.   We used a Soil scientist advancing it through his urethra into the  pelvis.  He did have a good sized urethral stone.  We then placed five  sutures in the urethra going from outside in.  We then turned our  attention to his bladder neck.  We did evert the mucosal edges  circumferentially with multiple simple interrupted sutures using 2-0  chromic.  Our urethral sutures were all 2-0 Vicryl.  Once our urethral  sutures had been placed we then placed them accordingly in an inside out  fashion at the level of the bladder.  We then advanced our new catheter  in through his urethra  and into his bladder and inflated the balloon  with 15 mL of sterile water.  We then tied down our anastomotic sutures  individually.  We then irrigated his bladder.  There was no  evidence of  an anastomotic leak and the bladder irrigated light blue urine without  blood.  We then washed his pelvis copiously and irrigated away our  solution.  We then reinspected his pelvis.  There were no active sites  of bleeding.  We then made a stab incision in the left lower quadrant  and developed this with Bovie electrocautery and with Tresa Endo and were  able to punch through his lateral rectus fascia into the operative field  and we placed our 19 [place] round Blake drain.  We cut it to appropriate  length and sutured it in place with nylon suture.  We then irrigated his  pelvis one more time and turned our attention to our closure.   His rectus fascia was closed with running suture using #1 PDS and all  skin incisions were closed with staples.  His catheter was placed to  straight drain.  His JP drain was placed to bulb grenade and this marked  the end of our procedure.  He tolerated the procedure well.  There were  no complications.  He was awakened from anesthesia and was taken to  recovery room in stable condition.  Estimated blood loss was 700 mL.  Dr. Annabell Howells was present and participated in all aspects of the case.     ______________________________  Dr Cori Razor. Annabell Howells, M.D.  Electronically Signed    DW/MEDQ  D:  12/29/2007  T:  12/29/2007  Job:  [id number]

## 2010-10-21 NOTE — Discharge Summary (Signed)
[name], [name]                  ACCOUNT NO.:  [id number]   MEDICAL RECORD NO.:  [id number]          PATIENT TYPE:  INP   LOCATION:  1411                         FACILITY:  [facility]   PHYSICIAN:  Excell Seltzer. Annabell Howells, M.D.    DATE OF BIRTH:  Mar 25, 1941   DATE OF ADMISSION:  12/29/2007  DATE OF DISCHARGE:  01/02/2008                               DISCHARGE SUMMARY   ADMITTING DIAGNOSIS:  Adenocarcinoma of the prostate.   DISCHARGE DIAGNOSIS:  Adenocarcinoma of the prostate.   PROCEDURE:  Robot assisted in converted to radical retropubic  prostatectomy with bilateral pelvic lymph node dissection on December 29, 2007.   HOSPITALIZATION DETAILS:  [name] is a 70 year old white male   Disregard this dictation.     ______________________________  Delman Kitten, M.D.      Excell Seltzer. Annabell Howells, M.D.     DW/MEDQ  D:  01/02/2008  T:  01/02/2008  Job:  [id number]

## 2010-10-21 NOTE — Discharge Summary (Signed)
[name], [name]                  ACCOUNT NO.:  [id number]   MEDICAL RECORD NO.:  [id number]          PATIENT TYPE:  INP   LOCATION:  1411                         FACILITY:  [facility]   PHYSICIAN:  Excell Seltzer. Annabell Howells, M.D.    DATE OF BIRTH:  [birth year]   DATE OF ADMISSION:  12/29/2007  DATE OF DISCHARGE:  01/02/2008                               DISCHARGE SUMMARY   ADDENDUM:  This addendum is added on to discharge summary dictated December 30, 2007.   CHANGES:  The date of discharge is January 02, 2008.   PROCEDURE:  Laparoscopic robot assisted converted to open radical  retropubic prostatectomy with bilateral pelvic lymph node dissection on  December 29, 2007.   FINAL PATHOLOGY:  Should read negative lymph nodes and prostatic  adenocarcinoma, Gleason 3+4=7 involving both lobes.  No angiolymphatic  invasion or extraprostatic extension.  All resection margins are  negative.  PT2, CNO, MX, and the gland weighed 52 gm.      [name], MD      Excell Seltzer. Annabell Howells, M.D.  Electronically Signed    JR/MEDQ  D:  01/02/2008  T:  01/02/2008  Job:  [id number]

## 2010-10-21 NOTE — Assessment & Plan Note (Signed)
[facility]                            CARDIOLOGY OFFICE NOTE   WOLF, BOULAY                         MRN:          [id number]  DATE:02/17/2007                            DOB:          08-Apr-1941    PRIMARY CARE PHYSICIAN:  Willow Ora, MD.   REASON FOR PRESENTATION:  Evaluate patient with palpitations.   HISTORY OF PRESENT ILLNESS:  Patient is 70 years old.  He presents for  evaluation of palpitations.  He has had none of this since I last saw  him.  He denies any presyncope or syncope.  He has been active, walking.  He denies any chest discomfort, neck or arm discomfort.  He denies any  shortness of breath, PND or orthopnea.   PAST MEDICAL HISTORY:  1. Nonobstructive coronary artery disease.  2. Premature atrial contractions.  3. Peptic ulcer disease.  4. Hypertension.  5. Ongoing tobacco abuse.  6. Recent hypokalemia.   ALLERGIES:  None.   MEDICATIONS:  1. Multivitamin.  2. Ginseng.  3. Triamterene/hydrochlorothiazide 37.5/25.  4. Doxazosin.  5. Caltrate.  6. Glucosamine.   REVIEW OF SYSTEMS:  As stated in the history of present illness and  otherwise negative for other systems.   PHYSICAL EXAMINATION:  VITAL SIGNS:  Blood pressure 122/78, heart rate  67 and regular.  GENERAL APPEARANCE:  Patient is in no distress.  HEENT:  Eye lids unremarkable.  Pupils are equal, round and reactive to  light.  Fundi not visualized.  Oral mucosa unremarkable.  NECK:  No jugular venous distension. Wave form within normal limits.  Carotid upstroke brisk and symmetric, no bruits, no thyromegaly.  LYMPHATICS:  No adenopathy.  CHEST:  Unremarkable.  LUNGS:  Clear to auscultation bilaterally.  BACK:  No costovertebral angle tenderness.  CARDIOVASCULAR:  PMI not displaced or sustained.  S1 and S2 within  normal limits.  No S3, no S4, no clicks, no rubs, no murmurs.  ABDOMEN:  Obese, positive bowel sounds, normal in frequent and pitch, no  bruits, no  rebound, no guarding, no midline pulsatile mass, no  hepatomegaly, no splenomegaly.  SKIN:  No rashes, no nodules.  EXTREMITIES:  There are 2+ pulses, no edema.   EKG with sinus rhythm, leftward axis, intervals within normal limits, no  acute ST wave changes.   ASSESSMENT/PLAN:  1. Palpitations.  Patient is having no further palpitations.  No      further cardiovascular testing is suggested.  2. Tobacco.  We had a long discussion (greater than 30 minutes) about      stopping smoking.  I will give him a prescription for Chantix.  He      was warned about the suicidal ideation and black box warning.  3. Follow-up.  He will be back in this clinic as needed.     Rollene Rotunda, MD, [facility]  Electronically Signed    JH/MedQ  DD: 02/17/2007  DT: 02/18/2007  Job #: [id number]   cc:   Willow Ora, MD

## 2010-10-21 NOTE — Discharge Summary (Signed)
[name], [name]                  ACCOUNT NO.:  [id number]   MEDICAL RECORD NO.:  [id number]          PATIENT TYPE:  INP   LOCATION:  1519                         FACILITY:  [facility]   PHYSICIAN:  Rosalyn Gess. Norins, MD  DATE OF BIRTH:  06-28-1940   DATE OF ADMISSION:  03/14/2008  DATE OF DISCHARGE:  03/17/2008                               DISCHARGE SUMMARY   ADMITTING DIAGNOSES:  1. Abdominal pain.  2. Diarrhea  3. Hypertension.  4. Adenocarcinoma of the prostate status post prostatectomy.   DISCHARGE DIAGNOSES:  1. Diarrhea, resolved.  2. Abdominal pain, stable.  3. Hypertension.  4. Genitourinary, stable.   CONSULTANTS:  Dr. Claudette Head for Gastroenterology.  Dr. Bjorn Pippin  for Urology.   PROCEDURE:  1. Acute abdominal series March 15, 2008, which revealed a      nonobstructive bowel gas pattern with no acute abnormality.  2. Flexible sigmoidoscopy performed March 16, 2008 which was      unremarkable study except for diverticulosis with no acute      pathology noted.   HISTORY OF PRESENT ILLNESS:  Mr. [name] is a 70 year old gentleman who is  two months status post radical prostatectomy that started as robotic and  was completed as an open procedure.  He reports that he had abdominal  cramps with watery diarrhea and rectal pain since September 28th.  He  fell weak but not dizzy.  He took antibiotics at the time of his  prostatectomy.  Evidently at that time it seems his stool was negative  for C. difficile but positive for lactoferrin.  The patient's symptoms  continued to persist and became worse with increasing frequency of  watery stools.  He was seen in the emergency department on the day prior  to admission with a normal white count, normal hemoglobin, normal liver  function, normal chemistries and a second set of stool cultures.  He was  admitted for observation and further evaluation.   See the EMR H&P for past medical history, family history, social history  and admitting exam.   HOSPITAL COURSE:  1. The patient was admitted to a regular floor.  Stool cultures were      performed and they were negative for      a.     diff x2 on October 7th and October 9th.  The patient was       started empirically on Flagyl prior to admission and this was       continued.  The patient did have an acute abdominal series as       noted that was unremarkable.  The patient was seen by the GI       service.  He had flexible sigmoidoscopy which was unremarkable.       It had been recommended he be continued on Flagyl 250 mg q.i.d.       for a full 14-day course and Florastor b.i.d. for an additional 2       weeks.  At the time of discharge the patient reports he not had  any diarrhea for over 12 hours.  His abdominal pain was improved.       He was taking a diet and did feel ready to be sent home.  2. GU:  The patient was seen by Dr. Annabell Howells in consultation.  He felt      the patient's surgical wounds were doing well.  He felt the      incisional pain and discomfort was prolonged but was not      unreasonable.  He recommended the addition of amitriptyline 25 mg      p.o. q.h.s. to help manage persistent pain.  3. Hypokalemia.  The patient had a mildly depressed potassium which      was corrected.  4. Hypertension.  The patient was continued on his home medications      and this was a controlled problem.   With the patient's medical problems being stable, with diarrhea being  resolved, with evaluation being completed, he was thought to be stable  and ready for discharge home.   DISCHARGE EXAMINATION:  VITAL SIGNS:  Temperature was 98, blood pressure  115/61, heart rate 58, respirations 20, O2 sat 94% on room-air.  GENERAL APPEARANCE:  This is a well-nourished gentleman looking younger  than his stated chronologic age, sitting on the side of the bed in no  acute distress.  HEENT:  Exam was unremarkable.  CHEST:  Clear with no rales, wheezes or rhonchi.   CARDIOVASCULAR:  The patient had a regular rate and rhythm.  ABDOMEN:  Soft with positive bowel sounds in all quadrants.  No further  examination conducted.   Final report on stool culture from March 14, 2008 showed no suspicious  colonies and they were continuing to hold.  Final metabolic panel from  October 9th with a sodium of 142, potassium 3.5, chloride of 113, CO2  25, BUN of 8, creatinine 0.83.  Final C. diff toxin from March 15, 2008  was negative. Stool for Giardia and Cryptosporidium was pending at time  of discharge.  Final CBC from March 15, 2008 with a hemoglobin of 13.5  grams, white count 5400.   DISCHARGE MEDICATIONS:  The patient will resume all his home medications  including simvastatin 20 mg q.h.s., multivitamin,  triamterene/hydrochlorothiazide 37.5/25 once daily, Cardura 4 mg two  tablets daily, Cartia XT 240 mg b.i.d., glucosamine daily, ginseng  daily, omeprazole 20 mg b.i.d., Zovirax ointment 5% to fever blisters as  needed.  Cipro will be discontinued.  He will be sent home on Flagyl 250  mg q.i.d. for an additional 10 days.  The patient will continue on  Florastor b.i.d. for an additional 2 weeks.   DISPOSITION:  The patient is to be discharged to home.  He should follow  up with Dr. Willow Ora in 7-10 days, sooner as needed.   The patient's condition at time of discharge dictation is stable and  improved.      Rosalyn Gess Norins, MD  Electronically Signed     MEN/MEDQ  D:  03/17/2008  T:  03/18/2008  Job:  [id number]   cc:   Venita Lick. Russella Dar, MD, FACG  [address]  [place]  [place] [address]   Willow Ora, MD  [phone] [address] [place], [place] [address]

## 2010-10-21 NOTE — Assessment & Plan Note (Signed)
[facility]                            CARDIOLOGY OFFICE NOTE   [name], [name]                         MRN:          [id number]  DATE:07/31/2008                            DOB:          09-15-1940    PRIMARY CARE PHYSICIAN:  Hunter Ora, MD   HISTORY OF PRESENT ILLNESS:  This is a 70 year old with a history of  nonobstructive coronary artery disease, PACs/PVCs, hypertension, and  hyperlipidemia who initially came to Cardiology clinic back in January  for evaluation of weakness, fatigue, and palpitations.  We did do a  Holter monitor in January showing occasional PACs and PVCs.  There were  no more significant arrhythmias noted.  Echocardiogram was done showing  preserved LV systolic function, mild left atrial enlargement and  diastolic dysfunction.  The patient's blood pressure was elevated the  last time he was here with systolic BP of 162.  He was started on Toprol-  XL 50 mg daily to lower his blood pressure and to try to suppress his  palpitations.  The patient's palpitations were causing him a significant  amount of distress.  The patient says that since starting on Toprol-XL,  his palpitations have completely resolved.  He still reports a low  energy level.  He is not getting short of breath with exertion.  He has  not had any episodes of chest pain.  He is able to climb a flight of  steps without difficulty.  The patient is the primary caregiver for his  wife who has severe, disabling fibromyalgia.  His mood is quite  depressed.  He has not been able to get out and do anything because of  the amount of time he need to care for his wife.  He has been sleeping  poorly.   PAST MEDICAL HISTORY:  1. Nonobstructive coronary artery disease.  The patient's left heart      catheterization done in 2005 showed a 30% left main stenosis and      extensively calcified LAD with moderate plaquing.  EF was 60%.      There is no obstructive disease.  2.  Hypertension.  3. PACs/PVCs.  Most recent Holter monitor in January 2010 showed      occasional PACs and PVCs.  There were no more significant      arrhythmias.  4. Peptic ulcer disease.  5. Gastroesophageal reflux disease with history of stricture.  6. Hypertension.  7. Hyperlipidemia.  8. Tobacco abuse.  The patient smokes a pack a day.  He has been on      Chantix in the past.  However, he does not want to use it due to      severe side effects.  9. Diverticulosis.  10.Prostate cancer, status post radical prostatectomy in July 2009.  11.Echocardiogram in February 2010, EF was 60%, mild left atrial      enlargement, calcified aortic and mitral valves, diastolic      dysfunction was noted.  12.Abdominal ultrasound in January 2010 did not show any evidence for      aortic aneurysm.  FAMILY HISTORY:  The patient's father had myocardial infarction at age  of 20.  Father also had a AAA.   SOCIAL HISTORY:  The patient is married.  He has 2 children.  He is  retired and lives in [place].  He cares for his wife, who has severe  fibromyalgia.  He smokes pack a day as done so for many years.   MEDICATIONS:  1. Diltiazem ER 240 mg daily.  2. Omeprazole 20 mg b.i.d.  3. Triamterene/HCTZ 37.5/25 mg daily.  4. Aspirin 81 mg daily.  5. Zocor 20 mg daily.  6. Toprol-XL 50 mg daily.   Most recent labs on January 2010.  TSH is normal.  Creatinine is 0.7,  hematocrit 48.1, from June 2009, LDL is 70.   PHYSICAL EXAMINATION:  VITAL SIGNS:  Blood pressure is 122/58, heart  rate is 68 and regular.  GENERAL:  He is a well-developed male in no apparent distress.  NEUROLOGIC:  Alert and oriented x3.  Normal affect.  LUNGS:  Clear to auscultation bilaterally with normal respiratory  effort.  NECK:  No JVD.  No thyromegaly or thyroid nodule.  CARDIOVASCULAR:  Heart is regular.  S1 and S2.  There is an S4.  There  is no murmur.  EXTREMITIES:  A 2+ posterior tibial pulses bilaterally.  No  carotid  bruit.  No peripheral edema.  ABDOMEN:  Soft and nontender.  No hepatosplenomegaly.  Normal bowel  sounds.   ASSESSMENT AND PLAN:  This is a 70 year old with a history of  palpitations, nonobstructive coronary artery disease, hypertension,  hyperlipidemia, who returns to Cardiology clinic after testing in the  setting of weakness and palpitations.  1. Skipped beats/palpitations.  Holter monitor shows frequent      premature atrial contractions and premature ventricular      contractions.  We did start the patient on Toprol-XL 50 mg a day,      in addition to his diltiazem.  He is not having any more      palpitations.  He does feel better from that standpoint.  His      echocardiogram showed normal left ventricular systolic function.  2. Hypertension.  The patient's blood pressure is much better today.      We will just continue him on Toprol-XL 50 mg a day.  3. Hyperlipidemia.  The patient's LDL was well controlled in June 2009      on Zocor.  My goal LDL for him would be 70 or less.  He will have      followup lipids with Dr. Drue Mccarthy in March 2010.  4. Smoking.  I talked to the patient at length about smoking      cessation.  He is not ready to quit.  5. Fatigue and depressed mood.  I do think that the patient probably      has some depression.  He is the primary caregiver for his disabled      wife.  He endorses a depressed mood.  He has normal hematocrit,      normal TSH, and I can found no other reason for him being so      fatigued and feeling so poorly.  I do think it would be reasonable      to give him a trial of antidepressant      medication.  We will start him on Celexa 20 mg daily.  6. I will see the patient in followup in 6 months.  Hunter Ancona, MD  Electronically Signed    DM/MedQ  DD: 07/31/2008  DT: 08/01/2008  Job #: [id number]   cc:   Hunter Ora, MD

## 2010-10-21 NOTE — Assessment & Plan Note (Signed)
[facility]                            CARDIOLOGY OFFICE NOTE   [name], [name]                         MRN:          [id number]  DATE:07/02/2008                            DOB:          [birth year]    PRIMARY CARE PHYSICIAN:  Willow Ora, MD   HISTORY OF PRESENT ILLNESS:  This is a 70 year old with history of  nonobstructive coronary artery disease, PAC/PVCs, hypertension, and  hyperlipidemia who presents to Cardiology Clinic for evaluation of  weakness, fatigue, and palpitations.  The patient was last seen by  Cardiology back in 2008.  Since that time he has been essentially  stable.  He has continued to have occasional palpitations; however, over  the last week or so, he says he has been feeling his heart skip beats  more often than it has been in the past.  He does not think it is ever  going fast.  He thinks he is just skipping beats very frequently.  This  is causing him significant amount of distress.  He also reports he feels  bad and weak.  This has been over the last couple of days.  His energy  level has gone down considerably.  He denies any fever.  He has had no  cough or upper respiratory symptoms.  No nausea, vomiting, or diarrhea.  His blood pressure has been running high at home and is 162/96 here.  He  has not had any chest pain.  He denies any significant dyspnea on  exertion.  He is able to climb a flight of stairs or walk on flat ground  without difficulty.  He has no orthopnea, PND.  He has had no  lightheadedness or syncope.   PAST MEDICAL HISTORY:  1. Nonobstructive coronary artery disease.  The patient's left heart      catheterization done in 2005 showed 30% left main stenosis, an      extensively calcified LAD with moderate plaquing, and EF was 60%.      There was no obstructive disease.  2. Hypertension.  3. PAC/PVCs.  4. Peptic ulcer disease.  5. Gastroesophageal reflux disease with history of stricture.  6.  Hypertension.  7. Hyperlipidemia.  8. Tobacco abuse.  The patient smokes pack a day.  He has been off      Chantix in the past.  However, he did not want to use Chantix due      to fear for side effects  9. Diverticulosis.  10.Prostate cancer status post radical prostatectomy in July 2009.   FAMILY HISTORY:  The patient's father had a myocardial infarction at the  age of 33.  His father also had a AAA.   SOCIAL HISTORY:  The patient is married, has two children.  He is  retired and lives in [place].  He cares for his disabled wife.  He  smokes a pack a day and has done so for many years.  He does not drink  any significant alcohol.   REVIEW OF SYSTEMS:  Negative except as noted in history of present  illness.  MEDICATIONS:  1. Diltiazem CD 240 mg daily.  2. Omeprazole 20 mg b.i.d.  3. Triamterene/HCTZ 37.5/25 daily.  4. Multivitamin.  5. Zocor 20 mg daily.  6. Doxazosin 8 mg daily.   RECENT LABORATORY DATA:  From November 2009, creatinine 0.8, from June  2009, LDL 70, HDL 39.1.   PHYSICAL EXAMINATION:  VITAL SIGNS:  Blood pressure is 162/96, heart  rate is 94 and regular.  GENERAL:  This is a well-developed elderly male in no apparent distress.  NEUROLOGIC:  Alert and oriented x3.  Normal affect.  LUNGS:  Clear to auscultation bilaterally with normal respiratory  effort.  NECK:  There is no JVD.  There is no thyromegaly or thyroid nodule.  HEENT:  Normal exam.  CARDIOVASCULAR:  Heart regular S1 and S2.  There is an S4.  There is no  murmur, 2+ posterior tibial pulses bilaterally.  There is no carotid  bruits.  There is no peripheral edema.  There is a prominent abdominal  aortic pulsation.  EXTREMITIES:  No clubbing or cyanosis.  SKIN:  Normal exam.  MUSCULOSKELETAL:  Normal exam.  ABDOMEN:  Soft, nontender.  No hepatosplenomegaly.  Normal bowel sounds.   ASSESSMENT AND PLAN:  70 year old with a history of palpitations and  premature atrial contraction/premature  ventricular contractions,  nonobstructive coronary artery disease, hypertension, hyperlipidemia who  presents to Cardiology Clinic for evaluation of weakness and  palpitations.  1. Skipped beats/palpitations.  The patient is having these more than      he has in the past.  His EKG today actually shows no premature      atrial contractions or premature ventricular contractions.  He has      been on diltiazem CD 240 mg a day for his palpitations.  They have      caused him significant amount of distress in the past.  He says      over the last few days they have been getting worse.  I do think it      is reasonable to get a 48-hour Holter monitor looking for any more      significant arrhythmia such as atrial fibrillation or flutter.      Additionally, I will start him on Toprol-XL 50 mg a day to help      suppress some of the premature beats and also to help control his      hypertension.  He is already on diltiazem CD 240 mg daily.  We are      going to get an echocardiogram to assess for any structural      abnormalities given the patient's premature ventricular      contractions and premature atrial contractions  2. Hypertension.  The patient's blood pressure is elevated to 162/96,      as mentioned we are going to start him on Toprol-XL 50 mg daily, I      will have him followup in 2 weeks for blood pressure check.  3. Hyperlipidemia.  The patient's LDL is well controlled in June 2009      on Zocor.  4. Smoking.  I had to talk with the patient at length about smoking      cessation.  He does not want to use Chantix because of the risk of      possible side effects.  He says that he really is not ready to quit      yet.  I encouraged him to do so.  We will obtain an  abdominal      ultrasound to screen for abdominal aortic aneurysm.  The patient is      a smoker, his age is 47.  He has a family history of abdominal      aortic aneurysm.  5. Weakness.  The patient feels that this may be  related to his      skipped beats.  He does not appear volume overloaded on exam, does      not have shortness of breath or chest pain.  I will get a CBC to      make sure he is not anemic and will also get a TSH to make sure he      is not hypothyroid.     Marca Ancona, MD  Electronically Signed    DM/MedQ  DD: 07/02/2008  DT: 07/03/2008  Job #: [id number]   cc:   Willow Ora, MD

## 2010-10-21 NOTE — Discharge Summary (Signed)
[name], [name]                  ACCOUNT NO.:  [id number]   MEDICAL RECORD NO.:  [id number]          PATIENT TYPE:  INP   LOCATION:  1411                         FACILITY:  [facility]   PHYSICIAN:  Excell Seltzer. Annabell Howells, M.D.    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   PROCEDURE:  Laparoscopic robot-assisted converted to open radical  retropubic prostatectomy on December 29, 2007.   COMPLICATIONS:  None.   HISTORY OF PRESENT ILLNESS:  The patient is a 70 year old white male  with clinically localized adenocarcinoma of the prostate detected on  transrectal ultrasound guided biopsy of his prostate gland.  He had  disease in his left mid lateral biopsy specimen.  After discussion  preoperatively about treatment modalities, he elected to undergo robot-  assisted laparoscopic radical prostatectomy.   HOSPITAL COURSE:  The patient had an uncomplicated prostatectomy.  Because of technical problems we converted from a robotic case to a  radical retropubic case prior to any dissection.  His prostatectomy went  very well from that point on and postoperatively he went to the regular  patient ward for his convalescence.  His postoperative day #1 hemoglobin  was 11.2 and he remained hemodynamically stable through his entire  hospitalization.  His pain was well managed initially with PCA and this  was converted to oral medication which he tolerated well with good  results.  His wounds remained clean, dry, and intact without evidence of  infection.  His diet was slowly advanced beginning with ice chips and  advancing to clear liquids.  His urine output has been good and has  remained clear during his entire hospital stay.   His JP output has been  minimal.  It was removed on the morning of postoperative day #2.  At  this point, it is our impression as urologic service that he has  maximized his benefit as an inpatient here at the hospital and likewise  will be discharged home appropriately.   FINAL PATHOLOGY:  Pending.   ACTIVITY:  1. Needs Foley catheter leg bag with appropriate teaching regarding      catheter care and maintenance.  2. No lifting greater than 10 pounds.  3. No driving a car while on narcotic analgesics.  4. Okay to shower.  He is not to bathe or go swimming until      instructed.  5. He has been given a postoperative instruction sheet for radical      retropubic prostatectomy.  6. He is to call or return to the clinic if he develops fever greater      than 101.5, uncontrolled pain, persistent nausea and vomiting, or      wound or catheter complications or any concerns or questions arise.   FOLLOWUP:  He is to see Dr. Annabell Howells in 1 week with cystogram and possible  catheter removal at that time.   DISCHARGE MEDICATIONS:  Please see medication reconciliation sheet.  New  additions would be:  1. Percocet.  2. Cipro.  3. Colace.      [name], MD      Excell Seltzer. Annabell Howells, M.D.  Electronically Signed    JR/MEDQ  D:  12/30/2007  T:  12/30/2007  Job:  [id number]

## 2010-10-24 NOTE — Assessment & Plan Note (Signed)
[facility]                              CARDIOLOGY OFFICE NOTE   HARM, JOU                         MRN:          [id number]  DATE:03/25/2006                            DOB:          09/20/1940    PRIMARY CARE PHYSICIAN:  Dr. Willow Ora.   REASON FOR PRESENTATION:  Palpitations.   HISTORY OF PRESENT ILLNESS:  The patient presents for follow up. He has had  no new symptoms. He still gets palpitations. He does not think they are  quite as bad. He would not take the Cardizem because of side effects. He is  taking Toprol 150 mg a day. He was started on Triamterene &  hydrochlorothiazide for his blood pressure. He had a stress-perfusion study  since I last saw him and this was negative for any evidence of ischemia or  infarct. He had a well preserved ejection fraction. He is still getting  chest pain only when he starts to eat. This is a burning upper discomfort.  He does not exert himself in particular. He gets fatigued when he does exert  himself but does not bring on the chest pain. He has no new shortness of  breath and denies any PND or orthopnea.  He is still complaining again of  fatigue as a major complaint.   PAST MEDICAL HISTORY:  Non obstructive coronary disease, peptic ulcer  disease, hypertension, ongoing tobacco abuse.   ALLERGIES:  None.   CURRENT MEDICATIONS:  Multivitamin, Gensing, Vytorin 10/20 daily, Protonix  40 mg daily p.r.n., Toprol 150 mg daily, triamterene & hydrochlorothiazide  37.5/25 daily, doxazosin 4 mg daily, Caltrate, Celebrex.   REVIEW OF SYSTEMS:  As stated in the HPI and otherwise negative for other  systems.   PHYSICAL EXAMINATION:  GENERAL:  The patient is in no acute distress.  VITAL SIGNS:  Blood pressure 161/80, weight 155 pounds, body mass index 24.  NECK:  Jugular venous distension at 45 degrees, carotid upstrokes brisk and  symmetric, no bruits or thyromegaly.  LYMPHATICS:  No nodes.  LUNGS:   Clear to auscultation bilaterally.  BACK:  No costovertebral angle tenderness.  CHEST:  Unremarkable.  HEART:  The PMI is not displaced or sustained, S1 and S2 within normal  limits, no S3, no S4, no murmurs.  ABDOMEN:  Obese, positive bowel sounds, normal in frequency and pitch, no  bruits, no rebound, no guarding. No midline pulsatile mass or organomegaly.  SKIN:  No rashes, no nodules.  EXTREMITIES:  Pulses 2+, no edema.   EKG: Sinus rhythm, rate 61, left axis deviation, intervals within normal  limits, no acute ST, T wave changes.   ASSESSMENT AND PLAN:  1. Palpitations. These are not particularly symptomatic. At this point I      would not suggest further change to his medicines. He will let me know      if he has any worsening symptoms going forward.  2. Chest discomfort. This does not sound anginal. He had a negative stress-      perfusion study. Therefore, he will see Dr. Victorino Dike  for work up of      non anginal chest pain. He understands that with his previous non      obstructive coronary disease however and his ongoing risks, he is at      risk for cardiovascular events and needs to continue primary risk      reduction.  3. Follow up. I will see him back in 6 months or sooner if needed.            ______________________________  Rollene Rotunda, MD, [facility]     JH/MedQ  DD:  03/25/2006  DT:  03/27/2006  Job #:  [id number]   cc:   Willow Ora, MD

## 2010-10-24 NOTE — Assessment & Plan Note (Signed)
[facility]                            CARDIOLOGY OFFICE NOTE   Hunter, Mccarthy                         MRN:          [id number]  DATE:08/13/2006                            DOB:          Oct 07, 1940    PRIMARY CARE PHYSICIAN:  Dr. Drue Novel.   REASON FOR PRESENTATION:  Evaluate patient with palpitations.   HISTORY OF PRESENT ILLNESS:  The patient is a 70 year old gentleman with  recent symptoms related to premature atrial contractions.  When I last  saw him last month I increased his Cardizem to 240 mg daily.  He said  that at first this did not work.  However, about 4 days ago he had a  resolution of his palpitations.  He is no longer having these.  He has  had no presyncope or syncope.  He has had no chest pain or shortness of  breath.   PAST MEDICAL HISTORY:  1. Nonobstructive coronary disease.  2. Premature atrial contractions.  3. Peptic ulcer disease.  4. Hypertension.  5. Tobacco abuse.  6. Recent hypokalemia supplemented.   ALLERGIES:  NONE.   MEDICATIONS:  1. Multivitamin.  2. Ginseng.  3. Triamterene hydrochlorothiazide 37.5/25 daily.  4. Doxazosin 4 mg daily.  5. Caltrate.  6. Cartia 240 mg daily.  7. Glucosamine.   REVIEW OF SYSTEMS:  As stated in the HPI and otherwise negative for  other systems.   PHYSICAL EXAMINATION:  The patient is in no distress.  Blood pressure  159/85, heart rate 85 and regular, weight 155 pounds, body mass index  24.  HEENT:  Eyelids unremarkable, pupils equally round and reactive to  light, fundi not visualized, oral mucosa unremarkable.  NECK:  No jugular venous distention at 45 degrees, carotid upstroke  brisk and symmetrical, no bruits, no thyromegaly.  LYMPHATICS:  No cervical, axillary, inguinal adenopathy.  LUNGS:  Clear to auscultation bilaterally.  BACK:  No costovertebral angle tenderness.  CHEST:  Unremarkable.  HEART:  PMI not displaced or sustained, S1 and S2 within normal limits,  no S3, no S4, no clicks, no rubs, no murmurs.  ABDOMEN:  Flat, positive bowel sounds normal in frequency and pitch, no  bruits, no rebound, no guarding, no midline pulsatile mass, no  organomegaly.  SKIN:  No rashes, no nodules.  EXTREMITIES:  With 2+ pulses throughout, no edema, no cyanosis, no  clubbing.  NEURO:  Oriented to person, place and time, cranial nerves II-XII  grossly intact, motor grossly intact.   ASSESSMENT/PLAN:  1. Palpitations.  The patient is having no further symptoms.  He will      continue on the current dose of Cardizem.  I would continue on the      potassium supplementation as well.  2. Nonobstructive coronary disease.  No further cardiovascular testing      is suggested.  We will discuss risk reduction and whether he should      be on a statin.  He will get a lipid profile.  In the past his LDL      has been very low and we have  not treated this.  3. Tobacco.  He understands the need to stop smoking but he says he is      too anxious to do this.  4. Followup.  I will see him back in 6 months or sooner if needed.     Rollene Rotunda, MD, [facility]  Electronically Signed    JH/MedQ  DD: 08/13/2006  DT: 08/14/2006  Job #: [id number]   cc:   Willow Ora, MD

## 2010-10-24 NOTE — Op Note (Signed)
[name], [name]                  ACCOUNT NO.:  [id number]   MEDICAL RECORD NO.:  [id number]          PATIENT TYPE:  INP   LOCATION:  5033                         FACILITY:  [facility]   PHYSICIAN:  Sharolyn Douglas, M.D.        DATE OF BIRTH:  May 18, 1941   DATE OF PROCEDURE:  03/26/2005  DATE OF DISCHARGE:                                 OPERATIVE REPORT   DIAGNOSIS:  1.  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  2.  Lumbar degenerative disk disease L5-S1, history of previous laminotomy.   PROCEDURE:  1.  Revision L5-S1 laminectomy and primary laminectomy of L4-5 with wide      decompression of thecal sac and nerve roots bilaterally. 2.      Transforaminal lumbar interbody fusion L4-5 and L5-S1, placement two      PEAK cages.  2.  Segmental pedicle screw instrumentation, L4-S1 using __________ spine      system  3.  Posterior spinal arthrodesis L4 through S1.  4.  Local autogenous bone graft supplemented with bone morphogenic protein.   SURGEON:  Sharolyn Douglas, MD.   ASSISTANT:  Verlin Fester, P.A.   ANESTHESIA:  General endotracheal.   ESTIMATED BLOOD LOSS:  360 mL.   COMPLICATIONS:  None.   INDICATIONS FOR PROCEDURE:  The patient is a pleasant male with progressive  worsening back and right leg pain. He has a degenerative spondylolisthesis  at L4-5.  He has had previous surgery at L5-S1. Because of severe persistent  symptoms, he has elected to undergo L4-S1 decompression and fusion in hopes  of improving his symptoms. Risk and benefits were reviewed.   PROCEDURE:  The patient taken to the operating room, underwent general  endotracheal anesthesia without difficulty, given prophylactic IV  antibiotics. Carefully positioned prone on the Wilson frame. All bony  prominences padded. Face eyes protected at all times. Back prepped, draped  usual sterile fashion. Neuro monitoring was established in the  form of SSEPs  and EMGs. A midline incision was made from L3 down to the sacrum. Dissection  was carried sharply through the previous scar. The paraspinal muscles were  elevated out to the tips the transverse processes of L4, L5 and sacral ala  bilaterally. Deep retractors placed, x-ray taken to confirm location. We  then completed a wide laminectomy by removing the entire spinous process and  lamina of L4 and L5. We carefully worked through the previous laminotomy  exposing the edges with curettes then using Kerrison punches to proceed  cephalad. Once the central laminectomy was completed, the nerve roots were  decompressed out laterally. We found that the L5 nerve root on the right  side was being compressed by a cyst from the facette joint as well as  hypertrophy of ligamentum flavum in the joint itself. We completely  decompress the nerve out its foramen. We then turned our attention to  placing pedicle screws at L4-5 and sacrum bilaterally using anatomic probing  technique. We utilized 6.5 x 50 mm screws at  L4 and L5, 7.5 x 40 mm screws  in the sacrum. We had good screw purchase. After placing each screw, it was  stimulated using triggered EMGs and there were no deleterious changes. We  then completed the transforaminal lumbar interbody fusion on the right side  at L4-5 and L5-S1 by completing the osteotomies of the facette joints. The  exiting and traversing nerve roots were identified. At L5-S1 disk space was  completely collapsed. We dilated the space up to 9 mm using intervertebral  spreaders.  We completely scraped out the cartilaginous endplates and disk  material. The disk space was packed with BMP sponges from the medium InFuse  kit along with local bone graft from the laminectomy. We then inserted a 9-  mm peak cage which had been packed with BMP sponge into the interspace  anteriorly and across the midline. Similar procedure carried out at L4-5.  This time we used an 11-mm  peak spacer and packed with BMP sponge. Free  running EMGs were monitored throughout the TLIF procedure and there were no  deleterious changes.  We completed the posterior spinal arthrodesis by  decorticating transverse processes of L4, L5 and sacral ala bilaterally.  Remaining local bone graft packed into the lateral gutters. 70 mm titanium  rods placed into the polyaxial screw heads. Gentle compression was applied  before shearing off the locking caps.  A cross connector was placed. Gelfoam  left over the exposed epidural space. Hemostasis achieved. Deep drain left,  deep fascia closed with a running #1 Vicryl suture. Subcutaneous layer  closed with 0 Vicryl, 2-0 Vicryl, followed by a running 3-0 subcuticular  Vicryl suture on skin edges. Benzoin, Steri-Strips placed. Sterile dressing  applied. Patient turned supine, extubated without difficulty and transferred  to recovery in stable condition able to move his upper and lower  extremities.      Sharolyn Douglas, M.D.  Electronically Signed     MC/MEDQ  D:  03/26/2005  T:  03/26/2005  Job:  [id number]

## 2010-10-24 NOTE — Assessment & Plan Note (Signed)
[facility]                              CARDIOLOGY OFFICE NOTE   ASHAZ, ROBLING                         MRN:          [id number]  DATE:01/21/2006                            DOB:          1941/02/11    PRIMARY CARE PHYSICIAN:  Willow Ora, M.D.   REASON FOR PRESENTATION:  Palpitations.   HISTORY OF PRESENT ILLNESS:  The patient is a pleasant 70 year old  gentleman.  I saw him in the past for preoperative clearance.  He had  nonobstructive coronary disease and no ongoing symptoms.  He was in the  lobby today with his wife.  He reported palpitations.  He was noticing these  the past several days particularly in the morning. He has had these spells  before.  He notices a strong heartbeat.  He has not had any presyncope or  syncope with them.  He denies any chest discomfort, neck discomfort, arm  discomfort, activity-induced nausea and vomiting, excessive diaphoresis.  He  has not been sleeping particularly well and has complained of fatigue.   PAST MEDICAL HISTORY:  1. Nonobstructive coronary disease as described.  2. Peptic ulcer disease.  3. Ongoing tobacco abuse.  4. Hypertension.   ALLERGIES:  NONE.   MEDICATIONS:  1. Aspirin 81 mg q.d.  2. Multivitamin.  3. Ginseng.  4. Toprol XL 150 mg q.d.  5. Vytorin 50 mg q.d.  6. Protonix.   REVIEW OF SYSTEMS:  Stated in the HPI and otherwise negative for other  systems.   PHYSICAL EXAMINATION:  GENERAL:  The patient is in no distress.  VITAL SIGNS:  Blood pressure 126/77, heart rate 75 and  regular, body mass  index 25, weight 157 pounds.  HEENT:  Eyes are unremarkable, pupils equal, round and reactive to light,  fundi not visualized, oral mucosa unremarkable.  NECK:  No jugular venous distention, wave form within normal limits, carotid  upstroke brisk and symmetric, no bruits, no thyromegaly.  Lymphatics:  No  cervical, axillary, inguinal adenopathy.  LUNGS:  Clear to auscultation  bilaterally.  BACK:  No costovertebral angle tenderness.  CHEST:  Unremarkable.  HEART:  PMI is nondisplaced or sustained.  S1 and S2 within normal limits,  no S3, no S4, no murmurs.  ABDOMEN:  Flat, positive bowel sounds.  Normal frequency and pitch, no  bruits, rebound, guarding, no midline pulsatile mass, no organomegaly.  SKIN:  No rashes, no nodules.  EXTREMITIES:  There are 2+ pulses without clubbing or edema.  NEUROLOGIC EXAM:  Grossly intact throughout.   EKG:  Sinus rhythm, leftward axis, intervals within normal limits,  interpolated PVC.   ASSESSMENT AND PLAN:  Palpitations.  The patient is having PVCs. He has no  structural heart disease and nonobstructive coronary disease.  At this point  I described to him the benign nature of these.  He is already taking Toprol  100 mg a day.  He did not tolerate a higher dose in the past and actually  lowered the dose himself, therefore, he probably would not tolerate trying  to go up again on the  dose and he would not probably tolerate Cardizem  although I might  consider these in the future.  For now, I have asked him  to avoid his caffeine and also suggest this might be related (in particular  his fatigue) to not sleeping well.  Therefore, I have given him a limited  prescription for Ambien.  If this improves his symptoms and in particular  his fatigue, I  have asked him to talk to Dr. Drue Novel about continuing that  prescription.   Follow up will be in this clinic as needed.                               Rollene Rotunda, MD, [facility]    JH/MedQ  DD:  01/21/2006  DT:  01/21/2006  Job #:  [id number]   cc:   Willow Ora, MD

## 2010-10-24 NOTE — Discharge Summary (Signed)
NAME:  [name], [name]                            ACCOUNT NO.:  [id number]   MEDICAL RECORD NO.:  [id number]                   PATIENT TYPE:  INP   LOCATION:  3704                                 FACILITY:  [facility]   PHYSICIAN:  Bruce Rexene Edison. Swords, M.D. [facility]           DATE OF BIRTH:  10/10/1940   DATE OF ADMISSION:  07/20/2003  DATE OF DISCHARGE:  07/24/2003                                 DISCHARGE SUMMARY   DISCHARGE DIAGNOSES:  1. Coronary artery disease with diffuse nonobstructive atherosclerosis.  2. Hypertension.  3. Hyperlipidemia.  4. History of eczema (currently on prednisone).   DISCHARGE MEDICATIONS:  1. Zyrtec 10 mg p.o. q.d.  2. Prednisone 10 mg p.o. q.d. for 3 days, then 10 mg p.o. q.d. for 3 days,     then 5 mg p.o. q.d. for 3 days.  3. Lisinopril/hydrochlorothiazide 25/25 one half p.o. q.d.  4. Lipitor 20 mg p.o. q.d.  5. Metoprolol 25 mg p.o. b.i.d.  6. Aspirin 81 mg p.o. q.d.   INSTRUCTIONS:  She is to follow a low fat diet. She is to stop smoking.   CONDITION ON DISCHARGE:  Improved (no chest pain).   HOSPITAL PROCEDURE:  Cardiac catheterization performed July 24, 2003 by  Dr. Riley Kill. See that report for details. There was well preserved left  ventricular function. There was 30% narrowing of the ostium of the left  main, extensive calcifications of the LAD.   OUTPATIENT RECOMMENDATIONS:  Dr. Riley Kill recommended exercise treadmill  testing.   OUTPATIENT FOLLOW UP:  The patient will need follow up for risk factor  modification including follow up of lipids.   OTHER HOSPITAL PROCEDURES:  Chest x-ray on July 20, 2003 demonstrated no  active lung disease.   HOSPITAL LABORATORY DATA:  BMET on July 24, 2003 was normal. CBC on  July 24, 2003 was normal except for hemoglobin of 12.5. Lipid profile  demonstrated cholesterol of 198, triglycerides 127, HDL 50, LDL 123. Cardiac  panels while in the hospital remain normal.   FOLLOWUP PLAN:  Dr. Drue Novel in two  to three weeks.   HOSPITAL COURSE:  The patient is admitted to the hospitalist service on  July 20, 2003. See Dr. Leta Jungling admission note for details.   1. Cardiac. The patient admitted with chest pain and seen in consultation by     cardiology who recommended angiography which was performed July 23, 2003 with results as above. Risks factor modification was recommended.  2. Hypertension. The patient has a history of hypertension. Was somewhat     hypotensive in the hospital. It was recommended that the ACE inhibitor be     decreased and the patient started on metoprolol which was done in the     hospital. Rather than changing the lisinopril HCT to lisinopril 10 mg     p.o. q.d., I have asked the patient to take half of his  lisinopril     __________ HCT 25 one half p.o. q.d.  3. Hyperlipidemia. The patient was started on Lipitor.  4. Tobacco abuse. The patient asked not to smoke, and he understands the     importance of that.                                                Bruce Rexene Edison Swords, M.D. [facility]    BHS/MEDQ  D:  07/24/2003  T:  07/24/2003  Job:  [id number]

## 2010-10-24 NOTE — Discharge Summary (Signed)
NAME:  [name], [name]                            ACCOUNT NO.:  [id number]   MEDICAL RECORD NO.:  [id number]                   PATIENT TYPE:  INP   LOCATION:  4727                                 FACILITY:  [facility]   PHYSICIAN:  Titus Dubin. Alwyn Ren, M.D. [facility]         DATE OF BIRTH:  March 12, 1941   DATE OF ADMISSION:  10/19/2003  DATE OF DISCHARGE:  10/21/2003                                 DISCHARGE SUMMARY   ADMISSION DIAGNOSIS:  Infectious gastroenteritis.   DISCHARGE DIAGNOSIS:  Gastroenteritis versus diverticulitis.   OPERATION:  None.   BRIEF HISTORY:  [name] is a 70 year old [name] who presented with  intractable nausea, vomiting, and diarrhea.  [name] ate at a fast food  restaurant at approximately 1 p.m. ingesting a fried fish sandwich which had  tartar sauce on it.  Two hours later, [name] began to have nausea and vomiting  associated with diarrhea, fever, and chills.  Part of this, [name] had some  heart palpitations which had been exacerbated by the acute gastroenteritis.  [name] also had head to toe pain with this acute episode.   PAST MEDICAL HISTORY:  1. Blood on ulcer.  [name] believes [name] had an endoscopy and colonoscopy in 2003     which showed diverticulosis.  2. [name] was hospitalized in February of this year with coronary artery disease     with nonobstructive atherosclerosis on catheterization.  3. [name] has had tarriness with Lipitor and was switched to Vytorin.  4. There have been no significant triggers such as exposures to sick pets or     significant travels.   HOSPITAL COURSE:  At the time of admission, [name] was profoundly somnolent,  having been medicated and had a resting tachycardia of 108, blood pressure  of 112/66.  Bowel sounds were decreased.  Abdomen was nontender.   Admitting white count was 15,600 with a hematocrit of 41.8.  there was a  left shift with 96% neutrophils.  Pending the time of this discharge, urine  cultures.  His basic metabolic profile was normal except for  a glucose of  138.  Total protein was 5.9, mildly reduced as was albumin at 3.2.  Liver  enzymes were normal.  A fasting lipid profile was checked while hospitalized  and revealed a total cholesterol of 127 with triglycerides of 94, HDL of 33,  LDL of 75.  As noted, [name] was recently changed from Lipitor to the Vytorin.   [name] received IV fluids and parenteral medicines for nausea.   On Oct 20, 2003, [name] complained of severe headache which is actually a  chronic problem [name] has had for over four decades and [name] takes two Excedrin  each morning.  Neck was supple with no sign of meningismus.  Temperature was  102.4 on Oct 20, 2003.  Blood cultures were collected and [name] was placed on  Tylenol, Cipro, and Flagyl empirically pending culture results.   On  Oct 21, 2003, [name] denied any abdominal pain, nausea, vomiting, or chest  pain.  [name] had eaten clear liquids and was anxious to advance his diet.  Temperature was 98.9, pulse was 78 and regular, respiratory rate 20.  Blood  pressure was 128/70.  Skin was warm and dry despite previous history of  eczema for which [name] had taken steroids.  Abdomen was soft with normal bowel  sounds.  Heart sound was regular.  [name] had no increased work of breathing.   Follow-up white count was 5900 with a hematocrit of 35.2 attributed to  rehydration.  Electrolytes were normal.  Because of the tachycardia during  the hospitalization with a pulse up to 122, free T4, TSH.  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  An  a.m. Cortisol was within normal limits.  This was done because of possible  adrenal insufficiency related to steroids for his eczema.   CONDITION ON DISCHARGE:  Improved.  It was recommended that [name] avoid  popcorn, peanuts, or other material which might irritate any preexisting  diverticulosis.  [name] was instructed to slow advance is diet, restricting  milk, dairy, and grease until [name] had been asymptomatic for 48  hours. [name] was  asked to call to go to the emergency room should [name] have abdominal pain,  high fevers, bloody mucus, or other new or progressive symptoms.   DISCHARGE MEDICATIONS:  1. Flagyl 500 mg t.i.d.  2. Cipro 500 mg b.i.d. for 7 days.   FOLLOW UP:  Follow up in four to five days with Dr. Wanda Plump.  His Toprol  was reinitiated in the hospital because of tachycardia and there was a pulse  response as noted.  [name] was to continue PPI each day.  [name] was asked to follow  up with Dr. Ulyess Mort each year.  [name] was also asked to discuss  Excedrin use with Dr. Ulyess Mort.  Referral for evaluation of  headache is deferred to Dr. Wanda Plump.   ACTIVITY:  As tolerated.                                                Titus Dubin. Alwyn Ren, M.D. [facility]    [facility]/MEDQ  D:  10/21/2003  T:  10/21/2003  Job:  [id number]   cc:   Wanda Plump, MD [facility]  [phone] [address] [place], [place] [address]

## 2010-10-24 NOTE — Assessment & Plan Note (Signed)
HEALTHCARE                            CARDIOLOGY OFFICE NOTE   LAMOUNT, BANKSON                         MRN:          [id number]  DATE:07/14/2006                            DOB:          02-19-1941    PRIMARY CARE PHYSICIAN:  Dr. Willow Ora.   REASON FOR VISIT:  Patient with palpitations.   HISTORY OF PRESENT ILLNESS:  The patient called today and said he was  experiencing palpitations.  He is 70 years old.  He has had symptomatic  PACs.  He said he started noticing these yesterday.  He gets very  anxious with these.  He feels it skipping several times throughout the  day.  He does not describe any sustained tachy arrhythmias.  He has had  no presyncope or syncope.  He gets weak all over.  He feels very  fatigued.  The fatigue has been a chronic problem.  He denies any chest  pressure, neck discomfort, arm discomfort, activity-induced nausea and  vomiting, or excessive diaphoresis.   PAST MEDICAL HISTORY:  Nonobstructive coronary disease, premature atrial  contractions, peptic ulcer disease, hypertension, tobacco abuse.   ALLERGIES:  None.   MEDICATIONS:  1. Multivitamin.  2. Ginseng.  3. Triamterine/hydrochlorothiazide 37.5/25.  4. Doxazosin 4 mg daily.  5. Caltrate.  6. Cartia XT 180 mg daily.  7. Glucosamine.   REVIEW OF SYSTEMS:  As stated in the HPI and otherwise negative for  other systems.   PHYSICAL EXAMINATION:  The patient is in no distress.  Blood pressure  160/91, heart rate 92 and regular, weight 155 pounds, body mass index  24.  HEENT:  Eyes unremarkable.  Pupils equal, round and reactive to light,  fundi not visualized.  Oral mucosa unremarkable.  NECK:  No jugular venous distention at 45 degrees.  Carotid upstroke  brisk and symmetric.  No bruits, no thyromegaly.  LYMPHATICS:  No lymphadenopathy.  LUNGS:  Clear to auscultation bilaterally.  BACK:  No costovertebral angle tenderness.  CHEST:  Unremarkable.  HEART:   PMI nondisplaced sustained, S1 and S2 within normal limits.  No  S3, no S4, no clicks, no rubs, no murmurs.  ABDOMEN:  Flat, positive bowel sounds normal in frequency and pitch, no  bruits, rebound, guarding, no midline pulsatile mass, no organomegaly.  SKIN:  No rashes.  EXTREMITIES:  2+ pulses, no edema, cyanosis, clubbing.  NEURO:  Oriented to person, place and time.  Cranial nerves II-XII  grossly intact, motor grossly intact.   ASSESSMENT AND PLAN:  1. Palpitations.  The patient is having more symptomatic palpitations.      I am going to increase his Cardizem to 240 mg daily.  He knows to      let me know if he has any increasing symptoms.  2. Anxiety/depression.  The patient was tearful in the office today.      Anxiety clearly is playing a role in his symptoms.  He had talked      to Dr. Drue Novel about this who had suggested perhaps a medication.  I      emailed  Dr. Drue Novel today and asked him please to follow up on this, as      I agree the patient probably needs medical therapy.  3. Followup.  He has a followup appointment in March and he will keep      this.  He will let me know if he has any worsening symptoms.  We      will be checking a BMET today.     Rollene Rotunda, MD, [facility]  Electronically Signed    JH/MedQ  DD: 07/14/2006  DT: 07/14/2006  Job #: [id number]   cc:   Willow Ora, MD

## 2010-10-24 NOTE — Discharge Summary (Signed)
[name], Mccarthy                  ACCOUNT NO.:  [id number]   MEDICAL RECORD NO.:  [id number]          PATIENT TYPE:  INP   LOCATION:  5033                         FACILITY:  [facility]   PHYSICIAN:  Sharolyn Douglas, M.D.        DATE OF BIRTH:  23-Oct-1940   DATE OF ADMISSION:  03/26/2005  DATE OF DISCHARGE:  03/31/2005                                 DISCHARGE SUMMARY   ADMITTING DIAGNOSES:  1.  L4-S1 degenerative joint disease and spondylolisthesis.  2.  Hypertension.  3.  Hypercholesterolemia.  4.  History of ulcer.  5.  Hyperglycemia.   DISCHARGE DIAGNOSES:  1.  Status post L4-S1 posterior spinal fusion, doing well.  2.  Postoperative blood loss anemia.  3.  Postoperative hyperglycemia.  4.  Postoperative hypokalemia.   PROCEDURES:  On March 26, 2005, the patient was taken to the operating  room for an L4-5 and L5-S1 for posterior spinal fusion with pedicle screws  ___________.  This was done by Sharolyn Douglas, M.D., assistance [facility], [place]-  C.  Anesthesia was general.   CONSULTATIONS:  None.   LABORATORIES:  CBC with diff preoperatively within normal limits with the  exception of eosinophils of 7 which was slightly elevated.  Postoperatively  H and H was monitored x 3 days, reached a low of 11.8 and 34.0 on March 28, 2005.  PT/INR and PTT preoperatively normal.  Complete metabolic panel  preoperatively was normal with the exception of glucose of 105.  Basic  metabolic panel was monitored x 2 days postoperatively.  On postoperative  day 1, he had a glucose of 126, otherwise normal.  Postoperative day 2 he  had potassium of 3.2, glucose of 114 and calcium of 8.2, otherwise normal.   UA was negative for preoperative.  Blood typing from preop type A, R cell  positive, antibody screen negative.   X-RAYS:  Portable spine on October 19 was used intraoperatively for  localization and screw placement from L4 to S1.  No EKG on the chart.   BRIEF HISTORY:  The patient is a  70 year old male who has had a long history  of problems with his back.  Unfortunately he has failed numerous types of  conservative management and his pain has gotten progressively, worse in fact  it has been extending into his right lower extremity. Because of the severe  nature of his symptoms it was thought his best course of management and hope  for improving his symptoms would be at L4-S1 decompression and fusion. Risks  and benefits of this procedure were discussed with the patient by Dr. Noel Gerold.  He indicates understanding and opts to proceed.   HOSPITAL COURSE:  On March 26, 2005, the patient was taken to the  operating room for the above listed procedure.  He tolerated the procedure  well without intraoperative complications. He had approximately 200 cc of  blood loss. He was transferred to the recovery room in stable condition.   Postoperatively, a routine orthopedic spine protocol was followed.  He  progressed along very well.  He did  not develop any significant  postoperative complications.   He did have some pain control issues through postoperative day 1.  However  we will continue PCA use and increase p.o. analgesic use.  His pain control  did significantly improve by postoperative day 2 to day 3.   The patient was visited daily and worked with physical therapy.  Occupational therapy on his brace, used back precautions and progressive  ambulation program and he progressed along well with them.   By March 31, 2005, the patient had met all orthopedic goals.  He was  independent and stable with his ambulation, understood his brace and  demonstrated ability to use properly.  Medically was stable and ready for  discharge.   DISCHARGE PLAN:  The patient was a 70 year old male status post L4-S1  posterior spinal fusion doing well.   ACTIVITIES:  Daily ambulation program.  Brace on when he is up.  No lifting  over 5 pounds.  Back precautions at all times.  Dressing  changes as needed  for the back.  Keep incision dry until all drainage stops.   DIET:  Regular diet.   MEDICATIONS ON DISCHARGE:  1.  Percocet for pain.  2.  Robaxin for muscle spasm.  3.  Vitamin daily.  4.  Calcium 1000-1200 daily.  5.  Colace 100 mg twice daily.  6.  Laxative as needed.  7.  Continue home medications.   Avoid NSAIDS times 2 months.   CONDITION ON DISCHARGE:  Stable and improved.   DISPOSITION:  The patient is being discharged to his home with the family's  assistance.  There is also home health physical therapy and occupational  therapy.      Verlin Fester, P.A.      Sharolyn Douglas, M.D.  Electronically Signed    CM/MEDQ  D:  06/17/2005  T:  06/17/2005  Job:  [id number]

## 2010-10-24 NOTE — Consult Note (Signed)
NAME:  LAMBCong, Hightower                            ACCOUNT NO.:  [id number]   MEDICAL RECORD NO.:  [id number]                   PATIENT TYPE:  INP   LOCATION:  3704                                 FACILITY:  [facility]   PHYSICIAN:  Carole Binning, M.D. [facility]         DATE OF BIRTH:  [birth year]   DATE OF CONSULTATION:  DATE OF DISCHARGE:                                   CONSULTATION   CHIEF COMPLAINT:  Chest pain.   HISTORY OF PRESENT ILLNESS:  Mr. Bagnall is a 70 year old male with  cardiovascular risk factors of hypertension, hyperlipidemia, tobacco abuse  and family history of premature coronary artery disease.  He has no known  prior cardiac history.  He was in his usual state of health until three  dyspnea ago when he noted intermittent symptoms of palpitations described as  a sensation of skipped heart beats.  These would occur intermittently  through the day and would be associated with symptoms of shortness of  breath, weakness and chest pressure.  This afternoon, he had recurrent  episodes of chest pressure or heaviness described at 4/10 in severity,  lasting up to two hours at a time.  Because his symptoms were worse today,  he sought medical attention and was subsequently admitted to the hospital.  Initial EKG and cardiac markers have been negative.  The patient was  evaluated in the emergency room and was given three sublingual nitroglycerin  with relief of his chest discomfort.  He is currently pain free.   The patient denies any prior history of exertional chest pain.  He does have  a history of exertional dyspnea.  He denies any orthopnea, PND or edema.  His cardiac risk factors are as stated above. He denies a history of  diabetes.   PAST MEDICAL HISTORY:  1. Hypertension.  2. Eczema for which he was recently started on prednisone.  3. Mild hyperlipidemia which has not been treated.   CURRENT MEDICATIONS:  1. Zestoretic 20/12.5 daily.  2. Zyrtec 10 mg daily.  3.  Hydroxyzine 10 mg p.r.n.  4. Excedrin with aspirin daily.  5. Glucosamine chondroitin.  6. Prednisone which was just started for his eczema.   ALLERGIES:  No known drug allergies.   SOCIAL HISTORY:  The patient is married and works two jobs.  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 Habits:  Tobacco one pack per day x45 years.  Alcohol none.   FAMILY HISTORY:  Father experienced first MI at age 20 and subsequently died  from cardiac disease at age 96.  Mother died at age 35 of emphysema.  There  is no history of coronary artery disease in his siblings.   REVIEW OF SYSTEMS:  As documented in the physician assistant handwritten  consultation note.   PHYSICAL EXAMINATION:  GENERAL:  This is a well-appearing, older male, in no  acute distress.  VITAL SIGNS:  Temperature 97.6, initial  pulse 108, respirations 18, blood  pressure 155/95.  Oxygen saturation on room air is 95%.  SKIN:  Warm and dry.  There is scattered eczematous patches on the upper  trunk and extremities.  HEENT:  Normocephalic, atraumatic.  Sclerae anicteric.  Oral mucosa is  unremarkable.  NECK:  No adenopathy or thyromegaly.  No JVD.  Carotid upstroke normal  without bruits.  CHEST:  Clear to percussion and auscultation.  CARDIAC:  Regular rate and rhythm, normal S1, S2.  No rub, murmur, or S3.  ABDOMEN:  Soft, nontender, without organomegaly.  Normal bowel sounds, no  bruits.  EXTREMITIES:  No cyanosis, clubbing or edema.  Peripheral pulses are 2+  throughout.   EKG shows normal sinus rhythm at a rate of 97, normal EKG.   Other laboratory data as noted in the chart.  Initial CK is 138, CK-MB 3.9,  and troponin I is 0.01.   ASSESSMENT:  The patient is a 70 year old male with multiple cardiac risk  factors.  He presents with symptoms of recurrent substernal chest pain at  rest which is worrisome for unstable angina.  He is currently chest pain  free, and there are no EKG abnormalities.    RECOMMENDATIONS:  1. Agree with plans to admit and rule out myocardial infarction.  2. We will treat with aspirin and heparin, beta-blocker and nitrates.  3. Would recommend cardiac catheterization to assess his coronary anatomy.  4. Would recommend checking lipid profile with institution of lipid-lowering     therapy if indicated.                                               Carole Binning, M.D. [facility]    MWP/MEDQ  D:  07/20/2003  T:  07/21/2003  Job:  [id number]   cc:   Wanda Plump, MD [facility]  [phone] [address] [place], [place] [address]

## 2010-10-24 NOTE — H&P (Signed)
NAME:  LAMBUlmer, Degen                            ACCOUNT NO.:  [id number]   MEDICAL RECORD NO.:  [id number]                   PATIENT TYPE:  INP   LOCATION:  4727                                 FACILITY:  [facility]   PHYSICIAN:  Titus Dubin. Alwyn Ren, M.D. [facility]         DATE OF BIRTH:  Nov 17, 1940   DATE OF ADMISSION:  10/19/2003  DATE OF DISCHARGE:                                HISTORY & PHYSICAL   Mr. Aument is a 70 year old gentleman with coronary artery disease who  presents with intractable nausea, vomiting, and diarrhea.   He ate at a [employer] restaurant at approximately 1 p.m., and approximately  two hours later he began to have nausea and vomiting associated with  diarrhea and questionable fever and chills.  He also had diaphoresis.  He  had ingested a fried fish sandwich; at this time, the patient is sedated and  unable to give additional history. According to the daughter, he had tartar  on the fish sandwich.   He has had heart palpitations for several days, and these were exacerbated  by these acute events.  Also associated has been head-to-toe pain.  He  apparently made the statement that even his hair hurts.   PAST MEDICAL HISTORY:  He had a duodenal ulcer for which he is followed by  Dr. Victorino Dike.  He has had a herniorrhaphy.  He also has history of  hypertension, eczema, hyperlipidemia.  His most recent hospitalization was  in February of this year for coronary artery disease and diffuse  unobstructive atherosclerosis on catheterization.  He underwent angiogram on  February 15.  Extensive calcification was noted.   At that time, he was a smoker but has since discontinued.  He does not  drink.   HOME MEDICATIONS:  1. Toprol 50 mg daily.  2. Lisinopril 20/12.5 daily.  3. Hydroxizine 10 mg at bedtime.  4. Protonix 40 mg daily.   Currently he has some tiredness with LIPITOR. The exact nature of the  etiology of this adverse effect of this was unclear.  As stated, his  daughter is the primary history Donielle Radziewicz at this time due to his altered  mental status due to medications.   FAMILY HISTORY:  Family history includes myocardial infarction in his  father; there is no history of diabetes, stroke, or cancer.   REVIEW OF SYSTEMS:  As outlined above.  There were no genitourinary  symptoms.   The patient has had no travel recently and has no sick pets. [place] source is  city.  There is no recent antibiotic administration.   He has not been on predinsone recently, but intermittently he has had bursts  for eczema.   PHYSICAL EXAMINATION:  GENERAL:  He is basically sound asleep and does not  respond to queries.  VITAL SIGNS:  He exhibits a resting tachycardia of approximately 108, blood  pressure 112/66, respiratory rate 26.  NECK:  No carotid bruits.  SKIN:  Warm and dry.  HEART:  Resting murmur.  LUNGS:  Breath sounds are decreased with scattered rales.  ABDOMEN:  Bowel sounds are also decreased.  Abdomen is nontender.  EXTREMITIES:  Pedal pulses are intact, and he has no edema.  He has no  active eczema or __________ changes at this time.  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  NEUROPSYCHIATRIC:  Exam not completed because of his sedation.   LABORATORY AND X-RAY DATA:  ISTAT revealed glucose of 135.  White count  15,600, hematocrit 41.8.   EKG reveals  tachycardia without ischemia.   PLAN:  He will be admitted to telemetry.  He has not been having chest pain,  but should such occur, cardiac enzymes will be collected.  He will continue  to receive Zofran IV and IV fluids.  He will be closely monitored for BMET.   Stools will be collected for Salmonella shigella and Campylobacter.  Attempts will be made to verify the etiology of the infectious  gastroenteritis possibly related to contamination.  At this time, there are  no other known cases related to patrons at this restaurant.                                                Titus Dubin. Alwyn Ren,  M.D. [facility]    WFH/MEDQ  D:  10/20/2003  T:  10/20/2003  Job:  [id number]   cc:   Wanda Plump, MD [facility]  [phone] [address] [place], [place] [address]

## 2010-10-24 NOTE — Cardiovascular Report (Signed)
NAME:  [name], DWYANE DUPREE                            ACCOUNT NO.:  [id number]   MEDICAL RECORD NO.:  [id number]                   PATIENT TYPE:  INP   LOCATION:  3704                                 FACILITY:  [facility]   PHYSICIAN:  Arturo Morton. Riley Kill, M.D.             DATE OF BIRTH:  Jun 16, 1940   DATE OF PROCEDURE:  07/23/2003  DATE OF DISCHARGE:                              CARDIAC CATHETERIZATION   PROCEDURES:  1. Left heart catheterization.  2. Selective coronary arteriography.  3. Selective left ventriculography.   CARDIOLOGIST:  Arturo Morton. Riley Kill, M.D.   INDICATIONS:  Mr. Clairmont is a delightful 70 year old gentleman who presents  with some chest discomfort.  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  The current study was done to assess coronary anatomy.   DESCRIPTION OF PROCEDURE:  The procedure was performed from the right  femoral artery using 6 French catheters.  He tolerated the procedure well,  and there were no major complications.  We ended up using a JL3 5 guiding  catheter to best intubate the left main.  Intracoronary nitroglycerin was  administered as well because of some mild catheter induced spasm.  There  were no complications with the procedure.   HEMODYNAMIC PRESSURES:  1. Central aortic pressure 106/67, mean 86.  2. Left ventricle 199/15.  3. No gradient on pullback across the aortic valve.   ANGIOGRAPHIC DATA:  1. Ventriculography was performed in the RAO projection.  Overall systolic     function was well preserved, and no segmental abnormalities or     contractures were identified.  Ejection fraction was normal.  2. The coronary arteries in general were moderately calcified with     particularly heavy calcification involving the mid left anterior     descending artery.  3. Left main coronary artery tapers at the ostium.  This probably is about     30-40% in luminal reduction.  However, the residual lumen is large enough     so that there is not damping  with initial crossing of this with the 2 mm     catheter.  4. The left anterior descending artery courses to the apex.  There is     extensive calcification in the mid vessel with probably 30-40% eccentric     calcified plaquing.  The distal vessel was a large caliber vessel.  There     was a major diagonal branch which is free of critical disease.  5. The circumflex is a large caliber vessel providing two marginal branches     with minor luminal irregularity.  6. The right coronary artery has about 20% mid narrowing.  The remainder of     the vessel was without critical narrowing.   CONCLUSIONS:  1. Well preserved left ventricular function.  2. Perhaps 30% narrowing of the ostium of the left main.  3. Extensive calcification of the left anterior  descending artery with     moderate eccentric plaquing.   RECOMMENDATIONS:  1. Absolute discontinuation of smoking.  2. Baby aspirin.  3. Statin if appropriate.  4. Outpatient exercise test would be wise.   DISCUSSION:  These issues have been discussed with the patient.  I have also  extensively reviewed the mechanism of acute myocardial infarction with the  patient's family and shown them the angiograms.  He will need close  followup.                                               Arturo Morton. Riley Kill, M.D.    TDS/MEDQ  D:  07/23/2003  T:  07/23/2003  Job:  [id number]   cc:   [facility]   Wanda Plump, MD [facility]  [phone] [address] [place], [place] [address]

## 2010-10-24 NOTE — Assessment & Plan Note (Signed)
[facility]                                 ON-CALL NOTE   ANTHONYMICHAEL, MUNDAY                         MRN:          [id number]  DATE:10/11/2006                            DOB:          Jan 12, 1941    PHONE NUMBER:  [phone].   SUBJECTIVE:  Patient states that he had his CT scan today and was told  by Dr. Drue Novel' office that he has diverticulitis with left lower quadrant  pain and medicines were called in to his pharmacy.  He calls because he  says that the pharmacy cost is expensive and the doctor needs to call  what sounds like the insurance company with an identification number to  get the medicine approved.  He does not know the name of the medicine.  He is not allergic to any antibiotics that he is aware of but states the  medicine would otherwise cost $166.00 out of pocket.  I have told him to  have the pharmacist call me and see if we can figure out if there is a  different medication that could be used for his diverticulitis and he  said that he would do that.  Otherwise, this sounds like a pre  authorization that can only be done during the day anyway.     Neta Mends. Panosh, MD  Electronically Signed    WKP/MedQ  DD: 10/11/2006  DT: 10/12/2006  Job #: [id number]

## 2010-10-24 NOTE — H&P (Signed)
NAME:  [name], [name]                            ACCOUNT NO.:  [id number]   MEDICAL RECORD NO.:  [id number]                   PATIENT TYPE:  EMS   LOCATION:  MINO                                 FACILITY:  [facility]   PHYSICIAN:  Wanda Plump, MD [facility]                 DATE OF BIRTH:  [birth year]   DATE OF ADMISSION:  07/20/2003  DATE OF DISCHARGE:                                HISTORY & PHYSICAL   CHIEF COMPLAINT:  Chest pain.   HISTORY OF PRESENT ILLNESS:  Mr. [name] is a 70 year old white male who  developed palpitation feeling three days ago and also a new onset of chest  pressure at the anterior mid chest for the last two days.  The pressure was  not radiated and was not associated with nausea, but he admits there has  been shortness of breath in the last three days on and off.  His symptoms  were almost resolved when he got to the ER, but he believes that the  sublingual nitroglycerin that he got here somehow helped the symptoms.   PAST MEDICAL HISTORY:  1. Hypertension.  2. Modestly elevated cholesterol on diet.  3. Eczema which was recently seen by a dermatologist and put on prednisone     due to the severity of the symptoms.   FAMILY HISTORY:  1. Father died from an MI at age 57.  2. History of history of diabetes, stroke, cancer that he can tell.   REVIEW OF SYSTEMS:  He denies any fever or chills.  He does have mild  chronic cough that he believes is due to tobacco.  No nausea, vomiting, or  diarrhea.  No abdominal pain.  He did admit to indigestion today,  described as burning in the throat and neck.  He does have daily headaches,  mostly in the mornings and denies any sinus congestion.   SOCIAL HISTORY:  He smokes a pack a day and does not drink.   MEDICATIONS:  1. Lisinopril HCT 20/12.5 one p.o. daily.  2. Zyrtec 10 mg 1 p.o. daily.  3. Atarax 10 mg p.r.n.  4. Prednisone 40 mg a day, started yesterday per his dermatologist.  5. He takes 1 or 2 sedatives a day which  contain aspirin.  6. He takes a number of natural supplements including Ginseng, Glucosamine,     and a B12 vitamin.   ALLERGIES:  No known drug allergies.   PHYSICAL EXAMINATION:  GENERAL:  The patient is alert and oriented, in no  apparent distress.  He is chest pain free at present.  VITAL SIGNS:  Blood pressure 155/95 with a pulse of 108.  His pulse is now  in the 90s.  Respiratory rate 18, O2 saturation 95%.  NECK:  No thyromegaly and normal carotid pulses.  LUNGS:  Clear to auscultation except for mild expiratory wheezing.  CARDIOVASCULAR:  Regular rate and rhythm  without a murmur.  Chest wall is  not tender to palpation, and the pressure is not reproducible.  ABDOMEN:  Soft, not distended.  Good bowel sounds.  No mass.  EXTREMITIES:  No edema and normal bilateral femoral pulses.  NEUROLOGIC:  Motor and speech are normal.  Extraocular movements are intact.  SKIN:  He has scattered areas of macular and papular erythema throughout.   LABORATORY AND X-RAY DATA:  Initial cardiac enzymes were negative.  White  count 12.7, hemoglobin 14.9, platelets 385.  Sodium 138, potassium 3.7,  blood sugar 86, creatinine 0.8.   EKG x 2 at the ER showed no acute changes.   Chest x-ray is reportedly negative.   IMPRESSION AND PLAN:  1. [name] has been admitted with palpitations, chest pain, and shortness     of breath.  Given his high risk status, I am concerned about this     representing unstable angina.  Differential diagnoses include also     pulmonary embolus, gastroesophageal reflux disease, or an arrhythmia.  At     this point, he will be admitted. We will start him on IV heparin,     aspirin, nitroglycerin, and telemetry.  I will be very cautious with beta     blocker because he is a heavy smoker.  He has some wheezing present.  I     did call cardiology who agree with the plan of care.  They will see him     in the morning.  2. Hypertension.  As an outpatient, his blood pressure has  been well     controlled.  Will add beta blockers while he is in the hospital and     consider keeping him on that.  3. He is very upset due to the eczema, so I will continue with low-dose     predinsone as well as Zyrtec and Atarax.  4. He was encouraged to let us know while he is in the hospital if he     develops further chest pain or other symptoms.                                                Wanda Plump, MD [facility]    JEP/MEDQ  D:  07/20/2003  T:  07/20/2003  Job:  [id number]

## 2011-01-10 ENCOUNTER — Other Ambulatory Visit: Payer: Self-pay | Admitting: Internal Medicine

## 2011-01-12 NOTE — Telephone Encounter (Signed)
Rx sent to pharmacy and left message for pt to call back to schedule a follow up before his next refill.

## 2011-01-12 NOTE — Telephone Encounter (Signed)
Ok to RF x 3 months, tell pt he is due for a ROV

## 2011-01-26 ENCOUNTER — Other Ambulatory Visit: Payer: Self-pay | Admitting: Internal Medicine

## 2011-01-27 NOTE — Telephone Encounter (Signed)
Rx Done . 

## 2011-03-06 LAB — BASIC METABOLIC PANEL
BUN: 6
BUN: 7
CO2: 31
Calcium: 8.9
Chloride: 102
Chloride: 106
Chloride: 109
Creatinine, Ser: 0.71
Creatinine, Ser: 0.8
GFR calc Af Amer: >60
GFR calc non Af Amer: >60
Glucose, Bld: 108 — ABNORMAL HIGH
Potassium: 3.5
Potassium: 3.5
Sodium: 140

## 2011-03-06 LAB — CBC
HCT: 32.2 — ABNORMAL LOW
Hemoglobin: 11.2 — ABNORMAL LOW
MCHC: 34.1
MCV: 95.3
MCV: 96.7
Platelets: 195
Platelets: 233
RBC: 3.61 — ABNORMAL LOW
RDW: 12.7
WBC: 8.4

## 2011-03-06 LAB — DIFFERENTIAL
Eosinophils Absolute: 0.1
Eosinophils Relative: 1
Lymphocytes Relative: 13
Lymphs Abs: 1
Monocytes Absolute: 0.6
Monocytes Relative: 7

## 2011-03-06 LAB — HEMOGLOBIN AND HEMATOCRIT, BLOOD
HCT: 47.6
Hemoglobin: 16.2

## 2011-03-06 LAB — ABO/RH: ABO/RH(D): A POS

## 2011-03-09 LAB — COMPREHENSIVE METABOLIC PANEL
ALT: 19
AST: 15
Albumin: 3.4 — ABNORMAL LOW
Alkaline Phosphatase: 66
BUN: 19
CO2: 25
Calcium: 8.8
Chloride: 103
Creatinine, Ser: 0.83
GFR calc Af Amer: >60
GFR calc non Af Amer: >60
Glucose, Bld: 92
Potassium: 3.6
Sodium: 136
Total Bilirubin: 0.7
Total Protein: 6.1

## 2011-03-09 LAB — DIFFERENTIAL
Basophils Absolute: 0
Basophils Relative: 0
Eosinophils Absolute: 0.1
Eosinophils Relative: 1
Lymphocytes Relative: 6 — ABNORMAL LOW
Lymphs Abs: 0.7
Monocytes Absolute: 0.6
Monocytes Relative: 6
Neutro Abs: 9.3 — ABNORMAL HIGH
Neutrophils Relative %: 87 — ABNORMAL HIGH

## 2011-03-09 LAB — URINALYSIS, ROUTINE W REFLEX MICROSCOPIC
Glucose, UA: NEGATIVE
Hgb urine dipstick: NEGATIVE
Ketones, ur: 40 — AB
Nitrite: NEGATIVE
Protein, ur: NEGATIVE
Specific Gravity, Urine: 1.027
Urobilinogen, UA: 0.2
pH: 5

## 2011-03-09 LAB — LIPASE, BLOOD: Lipase: 19

## 2011-03-09 LAB — CBC
HCT: 45.7
Hemoglobin: 15.4
MCHC: 33.7
MCV: 93.9
Platelets: 276
RBC: 4.87
RDW: 13
WBC: 10.7 — ABNORMAL HIGH

## 2011-03-09 LAB — CLOSTRIDIUM DIFFICILE EIA: C difficile Toxins A+B, EIA: NEGATIVE

## 2011-03-09 LAB — STOOL CULTURE

## 2011-03-10 LAB — BASIC METABOLIC PANEL
CO2: 26
Chloride: 110
GFR calc non Af Amer: >60
GFR calc non Af Amer: >60
Glucose, Bld: 94
Glucose, Bld: 94
Potassium: 3.1 — ABNORMAL LOW
Potassium: 3.5
Sodium: 142
Sodium: 142

## 2011-03-10 LAB — CBC
HCT: 39.7
Hemoglobin: 13.5
MCHC: 33.9
MCV: 94.4
RDW: 13

## 2011-03-10 LAB — CLOSTRIDIUM DIFFICILE EIA: C difficile Toxins A+B, EIA: NEGATIVE

## 2011-04-04 ENCOUNTER — Other Ambulatory Visit: Payer: Self-pay | Admitting: Internal Medicine

## 2011-04-06 NOTE — Telephone Encounter (Signed)
Last OV 06/18/10. Last filled 02/04/11. Previous OV note states f/u in 6 months

## 2011-04-07 ENCOUNTER — Other Ambulatory Visit: Payer: Self-pay | Admitting: Internal Medicine

## 2011-04-07 NOTE — Telephone Encounter (Signed)
Last OV 06/18/10 and was advised to rto for 6 month f/u.

## 2011-04-08 ENCOUNTER — Telehealth: Payer: Self-pay | Admitting: Internal Medicine

## 2011-04-08 MED ORDER — DILTIAZEM HCL ER COATED BEADS 240 MG PO CP24: 240.0000 mg | ORAL_CAPSULE | Freq: Every day | ORAL | Status: DC

## 2011-04-08 MED ORDER — TRIAMTERENE-HCTZ 37.5-25 MG PO TABS: 1.0000 | ORAL_TABLET | Freq: Every day | ORAL | Status: DC

## 2011-04-08 MED ORDER — OMEPRAZOLE 20 MG PO CPDR: 20.0000 mg | DELAYED_RELEASE_CAPSULE | Freq: Two times a day (BID) | ORAL | Status: DC

## 2011-04-08 NOTE — Telephone Encounter (Signed)
Refills resent.

## 2011-04-08 NOTE — Telephone Encounter (Signed)
See phone note, apparently the pharmacy has not been getting our electronic refills. You may like to call them instead. 

## 2011-04-08 NOTE — Telephone Encounter (Signed)
See phone note, apparently the pharmacy has not been getting our electronic refills. You may like to call them instead.

## 2011-04-08 NOTE — Telephone Encounter (Signed)
Pharmacy called, they did not get refills on omeprazole, cartia and Maxzide. I believe they were sent recently, please call them in my phone [phone]

## 2011-04-17 ENCOUNTER — Ambulatory Visit (INDEPENDENT_AMBULATORY_CARE_PROVIDER_SITE_OTHER): Payer: Medicare Other | Admitting: Internal Medicine

## 2011-04-17 ENCOUNTER — Encounter: Payer: Self-pay | Admitting: Internal Medicine

## 2011-04-17 VITALS — BP 132/72 | HR 64 | Temp 98.1°F | Resp 18 | Ht 65.35 in | Wt 132.2 lb

## 2011-04-17 DIAGNOSIS — R195 Other fecal abnormalities: Secondary | ICD-10-CM

## 2011-04-17 DIAGNOSIS — E785 Hyperlipidemia, unspecified: Secondary | ICD-10-CM

## 2011-04-17 DIAGNOSIS — Z23 Encounter for immunization: Secondary | ICD-10-CM

## 2011-04-17 DIAGNOSIS — R198 Other specified symptoms and signs involving the digestive system and abdomen: Secondary | ICD-10-CM

## 2011-04-17 DIAGNOSIS — K922 Gastrointestinal hemorrhage, unspecified: Secondary | ICD-10-CM

## 2011-04-17 DIAGNOSIS — R194 Change in bowel habit: Secondary | ICD-10-CM | POA: Insufficient documentation

## 2011-04-17 LAB — CBC WITH DIFFERENTIAL/PLATELET
Eosinophils Relative: 2.6 % (ref 0.0–5.0)
HCT: 45.1 % (ref 39.0–52.0)
Hemoglobin: 15.4 g/dL (ref 13.0–17.0)
Lymphs Abs: 1.8 10*3/uL (ref 0.7–4.0)
Monocytes Relative: 6.5 % (ref 3.0–12.0)
Neutro Abs: 6.2 10*3/uL (ref 1.4–7.7)
RDW: 13.5 % (ref 11.5–14.6)

## 2011-04-17 LAB — LIPID PANEL
HDL: 47.2 mg/dL (ref >39.00)
LDL Cholesterol: 69 mg/dL (ref 0–99)
Total CHOL/HDL Ratio: 3
Triglycerides: 169 mg/dL — ABNORMAL HIGH (ref 0.0–149.0)
VLDL: 33.8 mg/dL (ref 0.0–40.0)

## 2011-04-17 NOTE — Assessment & Plan Note (Signed)
Due for FLP

## 2011-04-17 NOTE — Assessment & Plan Note (Addendum)
New onset 3 months ago: stools are different, LLQ discomfort, + hemoccult  Plan: labs , GI ref

## 2011-04-17 NOTE — Progress Notes (Signed)
  Subjective:    Patient ID: [name], male    DOB: 07/20/1940, 70 y.o.   MRN: [id number]  HPI 3 months history of change in the stools appearence, they look "reddish" , dark sometimes. 2 days ago he had 4 bowel movements which is uncommon for him and he did see red fresh blood on top of the stools, "small pieces". He has a history of hemorrhoids but they have not giving him any trouble lately  Past Medical History:       Hyperlipidemia       Hypertension       GERD (with stricture) and a h/o PUD       Prostate cancer, hx of (11/2007)--prostatectomy 7-09...no XRT       FATIGUE, CHRONIC       ERECTILE DYSFUNCTION       NEOP, BNG, ADRENAL GLAND        PEYRONIE'S DISEASE        Depression Dx  3-09 Diverticulosis. Cardiac Cath Nonobstructive coronary artery disease.  The patient's left heart catheterization done in 2005 showed 30% left main stenosis, an extensively calcified LAD with moderate plaquing, and EF was 60%.  There was no obstructive disease. h/o palpitations , PAC/PVCs.  Diastolic dysfunction: Echo (2/10) with EF 60%, mild focal basal septal hypertrophy, moderate diastolic dysfunction.    Past Surgical History: Spinal fusion ('99, '06) herniorrhaphy Prostatectomy B hernia repair 10-11   Review of Systems No fever or chills No weight loss No nausea or vomiting 3 months history of on and off tenderness at the left lower quadrant, discomfort seems more noticeable with bowel movements. Denies any GERD type symptoms or upper abdominal discomfort.    Objective:   Physical Exam  Constitutional: He is oriented to person, place, and time. He appears well-developed and well-nourished.  HENT:  Head: Normocephalic and atraumatic.  Eyes:       Not pale  Cardiovascular: Normal rate, regular rhythm and normal heart sounds.   No murmur heard. Pulmonary/Chest: Effort normal and breath sounds normal. No respiratory distress. He has no wheezes. He has no rales.  Abdominal: Soft.  Bowel sounds are normal. He exhibits no distension. There is no tenderness. There is no rebound and no guarding.  Genitourinary:       Has external hemorrhoids, small, no active  bleeding. Digital rectal exam show an absent prostate, brown stool was noted, it has minute pieces of what seems to be  dry blood, hemocult +  Neurological: He is alert and oriented to person, place, and time.  Skin: Skin is warm and dry.  Psychiatric: He has a normal mood and affect. His behavior is normal. Judgment and thought content normal.          Assessment & Plan:

## 2011-04-20 ENCOUNTER — Telehealth: Payer: Self-pay

## 2011-04-20 NOTE — Telephone Encounter (Signed)
Advised patient: Hemoglobin normal, no anemia. Cholesterol good, just the TG are slt elvated: watch diet Mailed copy of lab results with hand out on what high blood pressure and triglycerides are and how to improve results

## 2011-04-20 NOTE — Telephone Encounter (Signed)
Message copied by Francisco Capuchin on Mon Apr 20, 2011  5:14 PM ------      Message from: Willow Ora E      Created: Sun Apr 19, 2011  1:04 PM       Advise patient:      Hemoglobin normal, no anemia.      Cholesterol good, just the TG are slt elvated: watch diet

## 2011-04-21 ENCOUNTER — Encounter: Payer: Self-pay | Admitting: Gastroenterology

## 2011-04-26 ENCOUNTER — Other Ambulatory Visit: Payer: Self-pay | Admitting: Internal Medicine

## 2011-05-13 ENCOUNTER — Ambulatory Visit (INDEPENDENT_AMBULATORY_CARE_PROVIDER_SITE_OTHER): Payer: Medicare Other | Admitting: Gastroenterology

## 2011-05-13 ENCOUNTER — Encounter: Payer: Self-pay | Admitting: Gastroenterology

## 2011-05-13 VITALS — BP 144/60 | HR 60 | Ht 66.0 in | Wt 134.0 lb

## 2011-05-13 DIAGNOSIS — R198 Other specified symptoms and signs involving the digestive system and abdomen: Secondary | ICD-10-CM

## 2011-05-13 DIAGNOSIS — K921 Melena: Secondary | ICD-10-CM

## 2011-05-13 DIAGNOSIS — R195 Other fecal abnormalities: Secondary | ICD-10-CM

## 2011-05-13 MED ORDER — PEG-KCL-NACL-NASULF-NA ASC-C 100 G PO SOLR: 1.0000 | Freq: Once | ORAL | Status: DC

## 2011-05-13 NOTE — Patient Instructions (Signed)
You have been scheduled for a Colonoscopy. See separate instructions.  Pick up your prep kit from your pharmacy.  High Fiber diet given. cc: Willow Ora, MD

## 2011-05-13 NOTE — Progress Notes (Signed)
History of Present Illness: This is a 70 year old male who has noticed intermittent bright red blood in his stool. He was recently evaluated by Dr. Drue Novel and found to have Hemoccult-positive stool and external hemorrhoids on digital rectal exam. He also notes a slight change in bowel habits with mild diarrhea occasionally occurring a short time after a normal bowel movement. He has undergone colonoscopies by Dr. Corinda Gubler in  August 2002 showing diverticulosis and external hemorrhoids and in May of 2008 showing diverticulosis and internal and external hemorrhoids. Denies weight loss, abdominal pain, constipation, change in stool caliber, melena, nausea, vomiting, dysphagia, reflux symptoms, chest pain.  Review of Systems: Pertinent positive and negative review of systems were noted in the above HPI section. All other review of systems were otherwise negative.  Current Medications, Allergies, Past Medical History, Past Surgical History, Family History and Social History were reviewed in [employer] record.  Physical Exam: General: Well developed , well nourished, no acute distress Head: Normocephalic and atraumatic Eyes:  sclerae anicteric, EOMI Ears: Normal auditory acuity Mouth: No deformity or lesions Neck: Supple, no masses or thyromegaly Lungs: Clear throughout to auscultation Heart: Regular rate and rhythm; no murmurs, rubs or bruits Abdomen: Soft, non tender and non distended. No masses, hepatosplenomegaly or hernias noted. Normal Bowel sounds Rectal: Deferred to colonoscopy, recent exam by Dr. Drue Novel showed external hemorrhoids and Hemoccult-positive brown stool Musculoskeletal: Symmetrical with no gross deformities  Skin: No lesions on visible extremities Pulses:  Normal pulses noted Extremities: No clubbing, cyanosis, edema or deformities noted Neurological: Alert oriented x 4, grossly nonfocal Cervical Nodes:  No significant cervical adenopathy Inguinal Nodes: No  significant inguinal adenopathy Psychological:  Alert and cooperative. Normal mood and affect  Assessment and Recommendations:  1. Small volume hematochezia and Hemoccult-positive stool-both may be due to hemorrhoids. Rule out colorectal neoplasms. The risks, benefits, and alternatives to colonoscopy with possible biopsy and possible polypectomy were discussed with the patient and they consent to proceed.   2. Diverticulosis and change in bowel habits. Increase dietary fiber and water intake. Consider the addition of a stool softener if the above measures are not effective. Further evaluation and colonoscopy as above.

## 2011-05-26 ENCOUNTER — Other Ambulatory Visit: Payer: Self-pay | Admitting: Internal Medicine

## 2011-06-04 ENCOUNTER — Other Ambulatory Visit: Payer: Self-pay | Admitting: Internal Medicine

## 2011-06-19 ENCOUNTER — Encounter: Payer: Self-pay | Admitting: Gastroenterology

## 2011-06-19 ENCOUNTER — Ambulatory Visit (AMBULATORY_SURGERY_CENTER): Payer: Medicare Other | Admitting: Gastroenterology

## 2011-06-19 DIAGNOSIS — K921 Melena: Secondary | ICD-10-CM

## 2011-06-19 DIAGNOSIS — R195 Other fecal abnormalities: Secondary | ICD-10-CM

## 2011-06-19 DIAGNOSIS — R198 Other specified symptoms and signs involving the digestive system and abdomen: Secondary | ICD-10-CM

## 2011-06-19 MED ORDER — SODIUM CHLORIDE 0.9 % IV SOLN: 500.0000 mL | INTRAVENOUS | Status: DC

## 2011-06-19 NOTE — Op Note (Signed)
[facility] [address] [place], [place]  [address]  COLONOSCOPY PROCEDURE REPORT  PATIENT:  [name], [name]  MR#:  [id number] BIRTHDATE:  [birth year], 70 yrs. old  GENDER:  male ENDOSCOPIST:  Judie Petit T. Russella Dar, MD, [facility]  PROCEDURE DATE:  06/19/2011 PROCEDURE:  Colonoscopy 19147 ASA CLASS:  Class II INDICATIONS:  1) heme positive stool  2) hematochezia  3) change in bowel habits MEDICATIONS:   These medications were titrated to patient response per physician's verbal order, Fentanyl 75 mcg IV, Versed 7 mg IV DESCRIPTION OF PROCEDURE:   After the risks benefits and alternatives of the procedure were thoroughly explained, informed consent was obtained.  Digital rectal exam was performed and revealed external hemorrhoids.   The LB CF-[id number] endoscope was introduced through the anus and advanced to the cecum, which was identified by both the appendix and ileocecal valve, without limitations.  The quality of the prep was adequate, using MoviPrep.  The instrument was then slowly withdrawn as the colon was fully examined. <<PROCEDUREIMAGES>> FINDINGS:  Moderate diverticulosis was found in the sigmoid to transverse colon.  Otherwise normal colonoscopy without other polyps, masses, vascular ectasias, or inflammatory changes. Retroflexed views in the rectum revealed internal hemorrhoids, moderate.  The time to cecum =  3.25  minutes. The scope was then withdrawn (time =  9.25  min) from the patient and the procedure completed.  COMPLICATIONS:  None  ENDOSCOPIC IMPRESSION: 1) Moderate diverticulosis in the sigmoid to transverse colon 2) Internal and external hemorrhoids  RECOMMENDATIONS: 1) High fiber diet with liberal fluid intake. 2) Given your age, you will not need another colonoscopy for colon cancer screening or polyp surveillance. These types of tests usually stop around age 35.  Venita Lick. Russella Dar, MD, [facility]  CC:  Willow Ora, MD  n. Rosalie DoctorVenita Lick. Marven Veley  at 06/19/2011 03:06 PM  Elsie Amis, [phone]

## 2011-06-19 NOTE — Patient Instructions (Signed)
Please follow discharge instructions given today. Resume current medications today. Diverticulosis and hemorrhoids seen today, see handouts. Try to follow high fiber diet with liberal fluid intake. Follow up with Dr.Stark as needed. Call us with any questions or concerns. Thank you!!

## 2011-06-19 NOTE — Progress Notes (Signed)
Patient did not experience any of the following events: a burn prior to discharge; a fall within the facility; wrong site/side/patient/procedure/implant event; or a hospital transfer or hospital admission upon discharge from the facility. (G8907) Patient did not have preoperative order for IV antibiotic SSI prophylaxis. (G8918)  

## 2011-06-22 ENCOUNTER — Telehealth: Payer: Self-pay | Admitting: *Deleted

## 2011-06-22 NOTE — Telephone Encounter (Signed)

## 2011-06-27 ENCOUNTER — Emergency Department (HOSPITAL_COMMUNITY): Payer: Medicare Other

## 2011-06-27 ENCOUNTER — Encounter (HOSPITAL_COMMUNITY): Payer: Self-pay | Admitting: Emergency Medicine

## 2011-06-27 ENCOUNTER — Emergency Department (HOSPITAL_COMMUNITY)
Admission: EM | Admit: 2011-06-27 | Discharge: 2011-06-27 | Disposition: A | Discharge status: Home / Self Care | Payer: Medicare Other | Attending: Emergency Medicine | Admitting: Emergency Medicine

## 2011-06-27 DIAGNOSIS — R404 Transient alteration of awareness: Secondary | ICD-10-CM | POA: Insufficient documentation

## 2011-06-27 DIAGNOSIS — F3289 Other specified depressive episodes: Secondary | ICD-10-CM | POA: Insufficient documentation

## 2011-06-27 DIAGNOSIS — M545 Low back pain, unspecified: Secondary | ICD-10-CM | POA: Insufficient documentation

## 2011-06-27 DIAGNOSIS — M25519 Pain in unspecified shoulder: Secondary | ICD-10-CM | POA: Insufficient documentation

## 2011-06-27 DIAGNOSIS — Z8546 Personal history of malignant neoplasm of prostate: Secondary | ICD-10-CM | POA: Insufficient documentation

## 2011-06-27 DIAGNOSIS — R110 Nausea: Secondary | ICD-10-CM | POA: Insufficient documentation

## 2011-06-27 DIAGNOSIS — F329 Major depressive disorder, single episode, unspecified: Secondary | ICD-10-CM | POA: Insufficient documentation

## 2011-06-27 DIAGNOSIS — M7918 Myalgia, other site: Secondary | ICD-10-CM

## 2011-06-27 DIAGNOSIS — M542 Cervicalgia: Secondary | ICD-10-CM | POA: Insufficient documentation

## 2011-06-27 DIAGNOSIS — K219 Gastro-esophageal reflux disease without esophagitis: Secondary | ICD-10-CM | POA: Insufficient documentation

## 2011-06-27 DIAGNOSIS — IMO0001 Reserved for inherently not codable concepts without codable children: Secondary | ICD-10-CM | POA: Insufficient documentation

## 2011-06-27 DIAGNOSIS — Z79899 Other long term (current) drug therapy: Secondary | ICD-10-CM | POA: Insufficient documentation

## 2011-06-27 DIAGNOSIS — M25559 Pain in unspecified hip: Secondary | ICD-10-CM | POA: Insufficient documentation

## 2011-06-27 DIAGNOSIS — E785 Hyperlipidemia, unspecified: Secondary | ICD-10-CM | POA: Insufficient documentation

## 2011-06-27 DIAGNOSIS — Z7982 Long term (current) use of aspirin: Secondary | ICD-10-CM | POA: Insufficient documentation

## 2011-06-27 DIAGNOSIS — I2510 Atherosclerotic heart disease of native coronary artery without angina pectoris: Secondary | ICD-10-CM | POA: Insufficient documentation

## 2011-06-27 DIAGNOSIS — W010XXA Fall on same level from slipping, tripping and stumbling without subsequent striking against object, initial encounter: Secondary | ICD-10-CM | POA: Insufficient documentation

## 2011-06-27 DIAGNOSIS — Z8711 Personal history of peptic ulcer disease: Secondary | ICD-10-CM | POA: Insufficient documentation

## 2011-06-27 DIAGNOSIS — M129 Arthropathy, unspecified: Secondary | ICD-10-CM | POA: Insufficient documentation

## 2011-06-27 DIAGNOSIS — I10 Essential (primary) hypertension: Secondary | ICD-10-CM | POA: Insufficient documentation

## 2011-06-27 MED ORDER — IBUPROFEN 800 MG PO TABS: 800.0000 mg | ORAL_TABLET | Freq: Three times a day (TID) | ORAL | Status: AC

## 2011-06-27 MED ORDER — HYDROCODONE-ACETAMINOPHEN 5-325 MG PO TABS
2.0000 | ORAL_TABLET | Freq: Once | ORAL | Status: AC
  Administered 2011-06-27: 2 via ORAL
  Filled 2011-06-27 (×2): qty 1

## 2011-06-27 MED ORDER — MORPHINE SULFATE 4 MG/ML IJ SOLN
4.0000 mg | Freq: Once | INTRAMUSCULAR | Status: AC
  Administered 2011-06-27: 4 mg via INTRAMUSCULAR
  Filled 2011-06-27: qty 1

## 2011-06-27 MED ORDER — HYDROCODONE-ACETAMINOPHEN 5-325 MG PO TABS: 2.0000 | ORAL_TABLET | Freq: Four times a day (QID) | ORAL | Status: AC | PRN

## 2011-06-27 MED ORDER — KETOROLAC TROMETHAMINE 60 MG/2ML IM SOLN
60.0000 mg | Freq: Once | INTRAMUSCULAR | Status: AC
  Administered 2011-06-27: 60 mg via INTRAMUSCULAR
  Filled 2011-06-27: qty 2

## 2011-06-27 NOTE — ED Notes (Signed)
Pt reports slipped on ice today. Pt c/o B/L shoulder pain and hip pain. Pt also reports low back pain.

## 2011-06-27 NOTE — ED Provider Notes (Signed)
History     CSN: [id number]  Arrival date & time 06/27/11  1033   First MD Initiated Contact with Patient 06/27/11 1106      Chief Complaint  Patient presents with  . Fall  . Shoulder Pain  . Hip Pain  . Back Pain    (Consider location/radiation/quality/duration/timing/severity/associated sxs/prior treatment) HPI   Patient is a 71 year old male who presents today after having a mechanical fall while walking his dogs. Patient reports loss of consciousness with this. He laid on the ground for some time as he thought someone to drive by and see him. When they did not he got himself up and managed to get home. Patient describes landing on his left hip and side. He has chronic shoulder problems at baseline. He reports left hip pain as well as bilateral shoulder pain. He also describes low back pain and neck pain. His no signs of head trauma but did endorse loss of consciousness. Patient says his pain is a 10 out 10. It is made worse with movement better with rest. Prior to his walk he took his normal Vicodin which he takes for shoulder pain. He also took Excedrin following the fall. He endorses nausea after his loss of consciousness. This is better now patient denies wanting any pain medication at this time. Past Medical History  Diagnosis Date  . Hyperlipidemia   . Hypertension   . GERD (gastroesophageal reflux disease)   . PUD (peptic ulcer disease)   . Esophageal stricture   . Prostate cancer 11/2007  . ED (erectile dysfunction)   . Benign neoplasm of adrenal gland   . Peyronie's disease   . Depression   . Diverticulosis   . Coronary artery disease   . Palpitations   . Hemorrhoids   . Ischemia   . Arthritis   . Cataract     "beginnings" per pt.    Past Surgical History  Procedure Date  . Spinal fusion 99,06  . Hernia repair   . Prostatectomy 12/2007  . Tonsillectomy and adenoidectomy   . Nasal septum surgery     Family History  Problem Relation Age of Onset  . Colon  cancer Maternal Aunt 50  . Heart attack Father 75  . Aneurysm Father   . Lymphoma Maternal Aunt   . Bone cancer Maternal Grandfather     History  Substance Use Topics  . Smoking status: Current Everyday Smoker -- 0.7 packs/day for 53 years    Types: Cigarettes  . Smokeless tobacco: Not on file  . Alcohol Use: No      Review of Systems  Constitutional: Negative.   HENT: Negative.   Eyes: Negative.   Respiratory: Negative.   Cardiovascular: Negative.   Gastrointestinal: Positive for nausea.  Genitourinary: Negative.   Musculoskeletal:       See history of present illness  Skin: Negative.   Neurological:       Loss of consciousness  Hematological: Negative.   Psychiatric/Behavioral: Negative.   All other systems reviewed and are negative.    Allergies  Etodolac; Pseudoephedrine; and Sulfonamide derivatives  Home Medications   Current Outpatient Rx  Name Route Sig Dispense Refill  . ACETAMINOPHEN 500 MG PO TABS Oral Take 500 mg by mouth every 6 (six) hours as needed.    . ASPIRIN EC 325 MG PO TBEC Oral Take 325 mg by mouth daily.    Marland Kitchen EXCEDRIN PO Oral Take 2 tablets by mouth daily as needed.    Marland Kitchen DILTIAZEM HCL  ER COATED BEADS 240 MG PO CP24 Oral Take 1 capsule (240 mg total) by mouth daily. 90 capsule 0  . HYDROCODONE-ACETAMINOPHEN 5-500 MG PO TABS Oral Take 1 tablet by mouth every 6 (six) hours as needed. As needed one daily and one nightly as needed     . METOPROLOL SUCCINATE ER 25 MG PO TB24  TAKE 2 TABLETS BY MOUTH EVERY MORNING AND 1 TABLET BY MOUTH EVERY EVENING 90 tablet 3  . MULTIVITAMINS PO TABS  1 tablet daily.      Marland Kitchen OMEPRAZOLE 20 MG PO CPDR  TAKE 1 CAPSULE BY MOUTH TWICE DAILY 180 capsule 0  . SIMVASTATIN 20 MG PO TABS  TAKE 1 TABLET BY MOUTH EVERY NIGHT AT BEDTIME 90 tablet 0  . TRIAMTERENE-HCTZ 37.5-25 MG PO TABS Oral Take 1 each (1 tablet total) by mouth daily. 90 tablet 0    BP 161/81  Pulse 69  Temp(Src) 97.7 F (36.5 C) (Oral)  Resp 16  SpO2  96%  Physical Exam  Nursing note and vitals reviewed. Constitutional: He is oriented to person, place, and time. He appears well-developed and well-nourished. No distress.  HENT:  Head: Normocephalic and atraumatic.  Eyes: Conjunctivae and EOM are normal. Pupils are equal, round, and reactive to light.  Neck:       No midline tenderness. Tenderness palpation over the paraspinal muscles and trapezius muscles bilaterally  Cardiovascular: Normal rate, regular rhythm, normal heart sounds and intact distal pulses.  Exam reveals no gallop and no friction rub.   No murmur heard. Pulmonary/Chest: Effort normal and breath sounds normal. No respiratory distress. He has no wheezes. He has no rales.  Abdominal: Soft. Bowel sounds are normal. He exhibits no distension. There is no tenderness. There is no rebound and no guarding.  Musculoskeletal: He exhibits tenderness.       Tender to palpation over the shoulders bilaterally without deformity.Palpation over the neck and trapezius muscles without midline tenderness. Tenderness to palpation throughout the lower back with no step-offs appreciated. Thoracic spine is without tenderness palpation. Patient has negative log roll in the left leg and has been ambulate with difficulty.  Neurological: He is alert and oriented to person, place, and time. No cranial nerve deficit. He exhibits normal muscle tone. Coordination normal.  Skin: Skin is warm and dry. No rash noted.  Psychiatric: He has a normal mood and affect.    ED Course  Procedures (including critical care time)  Labs Reviewed - No data to display Dg Cervical Spine Complete  06/27/2011  *RADIOLOGY REPORT*  Clinical Data: Fall, neck pain  CERVICAL SPINE - COMPLETE 4+ VIEW  Comparison: None.  Findings: C1 through the cervical thoracic junction is visualized in its entirety.  Precervical soft tissue with this at upper limits of normal at C5-C6 but does not meet radiographic criteria for thickening.   Intervertebral disc spaces and vertebral body heights are preserved.  Minimal right C4-C5 neural foraminal narrowing is noted. The dens is intact and well situated between the lateral masses.  No fracture or dislocation.  Visualized lung apices are clear.  IMPRESSION: No focal acute osseous abnormality.  Original Report Authenticated By: Harrel Lemon, M.D.   Dg Lumbar Spine Complete  06/27/2011  *RADIOLOGY REPORT*  Clinical Data: Fall, low back pain  LUMBAR SPINE - COMPLETE 4+ VIEW  Comparison: CT 08/10/2009  Findings: Evidence of posterior fusion spanning L4-S1 again noted. No evidence for hardware failure.  No decreased intervertebral disc space or vertebral body height.  No malalignment.  Atherosclerotic aortic calcification noted without calcified aortic aneurysm.  IMPRESSION: No acute osseous abnormality.  Postsurgical fusion changes are again noted at L4-S1.  Original Report Authenticated By: Harrel Lemon, M.D.   Dg Shoulder Right  06/27/2011  *RADIOLOGY REPORT*  Clinical Data: Fall, right shoulder pain  RIGHT SHOULDER - 2+ VIEW  Comparison: MRI 03/29/2006  Findings: No fracture or dislocation.  Right lung apex is clear. AC joint distance is normal.  No radiopaque foreign body.  IMPRESSION: No acute abnormality.  Original Report Authenticated By: Harrel Lemon, M.D.   Dg Hip Complete Left  06/27/2011  *RADIOLOGY REPORT*  Clinical Data: Fall, left hip pain  LEFT HIP - COMPLETE 2+ VIEW  Comparison: None.  Findings: Lumbar fusion hardware is noted.  No displaced pelvic fracture.  Vascular calcifications are noted.  No left hip fracture or dislocation.  Minimal left hip degenerative change is identified.  IMPRESSION: No acute osseous abnormality.  Original Report Authenticated By: Harrel Lemon, M.D.   Ct Head Wo Contrast  06/27/2011  *RADIOLOGY REPORT*  Clinical Data: Fall  CT HEAD WITHOUT CONTRAST  Technique:  Contiguous axial images were obtained from the base of the skull through  the vertex without contrast.  Comparison: No similar prior study is available for comparison.  Findings: Minimal periventricular white matter hypodensity likely indicates small vessel ischemic change. No acute hemorrhage, acute infarction, or mass lesion is identified.  No midline shift.  No ventriculomegaly.  No skull fracture.  There is apparent volume averaging with a probable left maxillary sinus mucous retention cyst or polyp on image 1 of the exam.  Orbits and paranasal sinuses are otherwise clear.  IMPRESSION: No acute intracranial finding.  Original Report Authenticated By: Harrel Lemon, M.D.   Dg Shoulder Left  06/27/2011  *RADIOLOGY REPORT*  Clinical Data: Fall, left shoulder pain  LEFT SHOULDER - 2+ VIEW  Comparison: None.  Findings: No left shoulder fracture or dislocation is identified. The left AC joint distance is normal.  Left lung apex is clear.  IMPRESSION: No acute abnormality.  Original Report Authenticated By: Harrel Lemon, M.D.     1. Musculoskeletal pain       MDM  Patient was evaluated for his mechanical fall with resultant musculoskeletal pain and brief loss of consciousness. Head CT was ordered given his age a son the possibility of subdural from jarring effect a fall. He should also had plain films of the bilateral shoulders, C-spine, lumbar spine, and left hip. We discussed that I had very low suspicion for bony injury but patient and family were more comfortable with plain films. Patient initially did not want any medication for pain as he reported that he felt fine when he was at rest. However, given that he would have to be moved for x-rays he was given morphine 4 mg IM. Radiographic studies are pending this time.  1:38 PM All studies returned negative. Patient was given a shot of Toradol. He was discharged with prescription for additional Vicodin should he need it in addition to his normal prescription as well as ibuprofen. Patient was discharged in good  condition        Cyndra Numbers, MD 06/27/11 1338

## 2011-07-11 ENCOUNTER — Other Ambulatory Visit: Payer: Self-pay | Admitting: Internal Medicine

## 2011-07-13 NOTE — Telephone Encounter (Signed)
Refill done.  

## 2011-07-17 ENCOUNTER — Encounter: Payer: Self-pay | Admitting: Internal Medicine

## 2011-07-17 ENCOUNTER — Ambulatory Visit (INDEPENDENT_AMBULATORY_CARE_PROVIDER_SITE_OTHER): Payer: Medicare Other | Admitting: Internal Medicine

## 2011-07-17 VITALS — BP 136/84 | HR 72 | Temp 97.9°F | Ht 66.0 in | Wt 134.0 lb

## 2011-07-17 DIAGNOSIS — I10 Essential (primary) hypertension: Secondary | ICD-10-CM

## 2011-07-17 DIAGNOSIS — F329 Major depressive disorder, single episode, unspecified: Secondary | ICD-10-CM

## 2011-07-17 DIAGNOSIS — F3289 Other specified depressive episodes: Secondary | ICD-10-CM

## 2011-07-17 DIAGNOSIS — E785 Hyperlipidemia, unspecified: Secondary | ICD-10-CM

## 2011-07-17 DIAGNOSIS — R194 Change in bowel habit: Secondary | ICD-10-CM

## 2011-07-17 DIAGNOSIS — Z0000 Encounter for general adult medical examination without abnormal findings: Secondary | ICD-10-CM

## 2011-07-17 DIAGNOSIS — K219 Gastro-esophageal reflux disease without esophagitis: Secondary | ICD-10-CM

## 2011-07-17 LAB — COMPREHENSIVE METABOLIC PANEL
ALT: 32 U/L (ref 0–53)
AST: 26 U/L (ref 0–37)
Albumin: 3.9 g/dL (ref 3.5–5.2)
Calcium: 9.3 mg/dL (ref 8.4–10.5)
Chloride: 105 mEq/L (ref 96–112)
Potassium: 4.1 mEq/L (ref 3.5–5.1)
Sodium: 140 mEq/L (ref 135–145)

## 2011-07-17 MED ORDER — OMEPRAZOLE 20 MG PO CPDR: 20.0000 mg | DELAYED_RELEASE_CAPSULE | Freq: Two times a day (BID) | ORAL | Status: DC

## 2011-07-17 MED ORDER — TRIAMTERENE-HCTZ 37.5-25 MG PO TABS: 1.0000 | ORAL_TABLET | Freq: Every day | ORAL | Status: DC

## 2011-07-17 MED ORDER — METOPROLOL SUCCINATE ER 25 MG PO TB24: 25.0000 mg | ORAL_TABLET | Freq: Every day | ORAL | Status: DC

## 2011-07-17 MED ORDER — SIMVASTATIN 20 MG PO TABS: 20.0000 mg | ORAL_TABLET | Freq: Every day | ORAL | Status: DC

## 2011-07-17 NOTE — Assessment & Plan Note (Addendum)
Td 2004 pneumonia shot 2009 shingles shot   Rx provided before-- recommend him to go ahead and get it   Cscope 2002--no polyps,flex sig 10-09 done for diarrhea,  cscope 1-13: tics-hemorrhoids  PSA per urology  tobacco abuse, patient declined counseling, "I am not ready to quit " Diet exercise discussed

## 2011-07-17 NOTE — Progress Notes (Signed)
Subjective:    Patient ID: [name], male    DOB: [birth year], 71 y.o.   MRN: [id number]  HPI  History of Present Illness: Here for Medicare AWV: 1. Risk factors based on Past M, S, F history: reviewed 2. Physical Activities: very active at home, walks dog daily  3. Depression/mood:  gets depress , wife has a # of medical issues, doesn't desires meds at this              time  4. Hearing: no problems reported or noted to normal conversation  5. ADL's: independent  6. Fall Risk:recent fall noted, precautions discussed  7. Home Safety: does feel safe at home  8. Height, weight, &visual acuity: seee VS,  Vision ok w/ glasses  9. Counseling: yes  10. Labs ordered based on risk factors: yes 11.       Referral Coordination, if needed  12.       Care Plan, see a/p 13.       Cognitive Assessment: motor skills, cognition and memory seemed normal  in addition, we discussed the following R shoulder pain, severe after a fall last month, under the care of ortho Hyperlipidemia -- good medication compliance  Hypertension-- good med compliance, no recent ambulatory BPs but normal at orthopedic office Prostate cancer, sees urology regularly , still w/incontinence  Tobacco abuse-- still smoking. He has a lot of stress in his life right now and he states he is not  ready to quit   Past Medical History: hyperlipidemia Hypertension Depression Dx  3-09 GERD (with stricture) and a h/o PUD Prostate cancer, hx of (11/2007)--prostatectomy 7-09...no XRT FATIGUE, CHRONIC ED Prostate cancer, permanent incontinence past surgery PEYRONIE'S DISEASE  Tobacco abuse.   Diverticulosis. CV ---2005 Cardiac Cath---> Nonobstructive CAD 30% left main stenosis, an extensively calcified LAD with moderate plaquing, and EF was 60%.  There was no obstructive disease. ---h/o palpitations , PAC/PVCs.  ---Diastolic dysfunction: Echo (2/10) with EF 60%, mild focal basal septal hypertrophy, moderate diastolic  dysfunction.    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   Cardiovascular: Negative for chest pain and leg swelling.  Gastrointestinal: Negative for abdominal pain and blood in stool.       GERD sx well controlled  Musculoskeletal: Negative for myalgias and back pain.       Objective:   Physical Exam  Constitutional: He is oriented to person, place, and time. He appears well-developed. No distress.       Gained  2 pounds since last OV  Neck: No thyromegaly present.       Nl carotid pulse   Cardiovascular: Normal rate, regular rhythm and normal heart sounds.   No murmur heard. Pulmonary/Chest: Effort normal and breath sounds normal. No respiratory distress. He has no wheezes. He has no rales.  Abdominal: Soft. Bowel sounds are normal. He exhibits no distension. There is no tenderness. There is no rebound and no guarding.  Musculoskeletal: He exhibits no edema.  Neurological: He is alert and oriented to person, place, and time.  Skin: He is not diaphoretic.  Psychiatric: He  has a normal mood and affect. His behavior is normal. Judgment and thought content normal.       Assessment & Plan:  Shoulder pain, worse since fall last month, f/u by ortho

## 2011-07-17 NOTE — Assessment & Plan Note (Signed)
Improved, cscope neg

## 2011-07-17 NOTE — Assessment & Plan Note (Signed)
Well-controlled as long as he takes PPIs 

## 2011-07-17 NOTE — Assessment & Plan Note (Signed)
Well controlled per last FLP, just TG slt elevated, diet discussed

## 2011-07-17 NOTE — Assessment & Plan Note (Signed)
Counseled, declined meds, know to call if needed

## 2011-07-17 NOTE — Assessment & Plan Note (Signed)
Well controlled, labs  

## 2011-07-20 ENCOUNTER — Encounter: Payer: Self-pay | Admitting: *Deleted

## 2011-09-15 ENCOUNTER — Telehealth: Payer: Self-pay | Admitting: *Deleted

## 2011-09-15 ENCOUNTER — Telehealth: Payer: Self-pay | Admitting: Internal Medicine

## 2011-09-15 MED ORDER — METOPROLOL SUCCINATE ER 25 MG PO TB24: ORAL_TABLET | ORAL | Status: DC

## 2011-09-15 NOTE — Telephone Encounter (Signed)
Opened in error

## 2011-09-15 NOTE — Telephone Encounter (Signed)
Please change his med list to reflect what he is actually doing

## 2011-09-15 NOTE — Telephone Encounter (Signed)
Discuss with patient, Rx sent, med list updated.

## 2011-09-15 NOTE — Telephone Encounter (Signed)
Pt states that the toprol sig should be TAKE 2 TABLETS BY MOUTH EVERY MORNING AND 1 TABLET BY MOUTH EVERY EVENING as per his cardiologist. Per our records Pt is to take med daily. Please clarify how med is to be taken

## 2011-10-12 ENCOUNTER — Other Ambulatory Visit: Payer: Self-pay | Admitting: Internal Medicine

## 2011-12-09 ENCOUNTER — Ambulatory Visit (INDEPENDENT_AMBULATORY_CARE_PROVIDER_SITE_OTHER): Payer: Medicare Other | Admitting: Cardiology

## 2011-12-09 ENCOUNTER — Encounter: Payer: Self-pay | Admitting: Cardiology

## 2011-12-09 VITALS — BP 122/64 | HR 64 | Ht 66.0 in | Wt 127.0 lb

## 2011-12-09 DIAGNOSIS — F17200 Nicotine dependence, unspecified, uncomplicated: Secondary | ICD-10-CM

## 2011-12-09 DIAGNOSIS — I2510 Atherosclerotic heart disease of native coronary artery without angina pectoris: Secondary | ICD-10-CM

## 2011-12-09 DIAGNOSIS — I10 Essential (primary) hypertension: Secondary | ICD-10-CM

## 2011-12-09 DIAGNOSIS — R002 Palpitations: Secondary | ICD-10-CM

## 2011-12-09 DIAGNOSIS — E785 Hyperlipidemia, unspecified: Secondary | ICD-10-CM

## 2011-12-09 LAB — BASIC METABOLIC PANEL
BUN: 30 mg/dL — ABNORMAL HIGH (ref 6–23)
CO2: 29 mEq/L (ref 19–32)
Chloride: 101 mEq/L (ref 96–112)
Creatinine, Ser: 0.8 mg/dL (ref 0.4–1.5)

## 2011-12-09 LAB — LIPID PANEL
LDL Cholesterol: 64 mg/dL (ref 0–99)
Total CHOL/HDL Ratio: 3
Triglycerides: 118 mg/dL (ref 0.0–149.0)
VLDL: 23.6 mg/dL (ref 0.0–40.0)

## 2011-12-09 MED ORDER — DILTIAZEM HCL ER COATED BEADS 360 MG PO CP24: 360.0000 mg | ORAL_CAPSULE | Freq: Every day | ORAL | Status: DC

## 2011-12-09 MED ORDER — TRAZODONE HCL 50 MG PO TABS: 50.0000 mg | ORAL_TABLET | Freq: Every day | ORAL | Status: DC

## 2011-12-09 NOTE — Patient Instructions (Addendum)
Need to schedule 48 hr heart monitor    Increase Diltiazem to 360 mg daily   Your physician recommends that you have lab work today bmet,lipids,tsh,magnesium   Start Trazodone 50 mg at bedtime.    Your physician recommends that you schedule a follow-up appointment in: 1 month

## 2011-12-09 NOTE — Progress Notes (Signed)
Patient ID: [name], male   DOB: 06/28/1940, 71 y.o.   MRN: [id number] PCP: Dr. Drue Novel  71 yo with history of HTN, hyperlipidemia, nonobstructive CAD, and palpitations due to PACs/PVCs presents for followup. He has been on Toprol XL and diltiazem CD for the palpitations.  He returns today because of increase palpitations over the last 2-3 weeks. He feels like his heart is skipping beats.  He has been taking an extra 25 mg Toprol XL in the evening without much effect though he thinks it makes him more fatigued.  No exertional dyspnea or chest pain.  No syncope or lightheadedness.  He continues to be under a lot of stress taking care of his disabled wife.  He has had trouble sleeping for several months now.  No ETOH or caffeine.    ECG: NSR, PAC  Labs (11/10): TSH normal, LDL 80, HDL 43  Labs (11/12): K 4.1, creatinine 0.8, LDL 69, HDL 47  Allergies (verified):  1) ! Sudafed  2) ! Lodine  3) ! Sulfa   Past Medical History:  1. Nonobstructive coronary artery disease. The patient's left heart catheterization done in 2005 showed 30% left main stenosis, an extensively calcified LAD with moderate plaquing, and EF was 60%. There was no obstructive disease.  2. Hypertension.  3. PAC/PVCs.  4. Peptic ulcer disease.  5. Gastroesophageal reflux disease with history of stricture.  6. Hypertension.  7. Hyperlipidemia.  8. Tobacco abuse. The patient smokes pack a day. He has been off Chantix in the past. However, he did not want to use Chantix due to fear for side effects  9. Diverticulosis.  10. Prostate cancer status post radical prostatectomy in July 2009.  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 Depression Dx 3-09  13. Diastolic dysfunction: Echo (2/10) with EF 60%, mild focal basal septal hypertrophy, moderate diastolic dysfunction.  14. OA right shoulder   Family History:  colon ca--aunt  prostate ca--no  MI--F at age 28   Social History:  Married, wife is disabled  2 kids  fully retired  Patient  currently smokes 1ppd  Alcohol Use - no  Illicit Drug Use - no  Patient does not get regular exercise but is active   ROS: All systems reviewed and negative except as per  HPI.    Current Outpatient Prescriptions  Medication Sig Dispense Refill  . aspirin EC 325 MG tablet Take 325 mg by mouth daily.      . Aspirin-Acetaminophen-Caffeine (EXCEDRIN PO) Take 2 tablets by mouth daily as needed.      Marland Kitchen HYDROcodone-acetaminophen (VICODIN) 5-500 MG per tablet Take 1 tablet by mouth every 6 (six) hours as needed. As needed one daily and one nightly as needed       . metoprolol succinate (TOPROL-XL) 25 MG 24 hr tablet TAKE 2 TABLETS BY MOUTH EVERY MORNING AND 1 TABLET BY MOUTH EVERY EVENING  270 tablet  3  . multivitamin (THERAGRAN) per tablet 1 tablet daily.        Marland Kitchen omeprazole (PRILOSEC) 20 MG capsule Take 1 capsule (20 mg total) by mouth 2 (two) times daily.  180 capsule  1  . simvastatin (ZOCOR) 20 MG tablet Take 1 tablet (20 mg total) by mouth at bedtime.  90 tablet  3  . triamterene-hydrochlorothiazide (MAXZIDE-25) 37.5-25 MG per tablet Take 1 each (1 tablet total) by mouth daily.  90 tablet  3  . diltiazem (CARDIZEM CD) 360 MG 24 hr capsule Take 1 capsule (360 mg total) by mouth  daily.  30 capsule  6  . traZODone (DESYREL) 50 MG tablet Take 1 tablet (50 mg total) by mouth at bedtime.  30 tablet  3   BP 122/64  Pulse 64  Ht 5\' 6"  (1.676 m)  Wt 57.607 kg (127 lb)  BMI 20.50 kg/m2 General: NAD Neck: No JVD, no thyromegaly or thyroid nodule.  Lungs: Clear to auscultation bilaterally with normal respiratory effort. CV: Nondisplaced PMI.  Heart regular S1/S2 with occasional premature beat, no S3/S4, no murmur.  No peripheral edema.  No carotid bruit.  Normal pedal pulses.  Abdomen: Soft, nontender, no hepatosplenomegaly, no distention.  Skin: Intact without lesions or rashes.  Neurologic: Alert and oriented x 3.  Psych: Normal affect. Extremities: No clubbing or cyanosis.

## 2011-12-10 DIAGNOSIS — I2510 Atherosclerotic heart disease of native coronary artery without angina pectoris: Secondary | ICD-10-CM | POA: Insufficient documentation

## 2011-12-10 NOTE — Assessment & Plan Note (Signed)
I again counseled him to quit.  He is under a lot of stress with his ill wife and I am unsure how successful he will be at this time.

## 2011-12-10 NOTE — Assessment & Plan Note (Signed)
Worsening palpitations recently.  He has known PACs/PVCs.  He is in NSR today.  He avoids caffeine.   He has not been getting much sleep recently.  Lack of sleep could drive PACs/PVCs.   - 48 hour holter (make sure no atrial fibrillation is present).  - Check BMET, Mg, TSH - Start trazodone 50 mg qhs prn to help with sleep.  - Increase diltiazem CD to 360 mg daily to see if this helps symptoms.  As Toprol XL seems to make him fatigued, I will not have him increase the dose.  - Followup in 1 month.

## 2011-12-10 NOTE — Assessment & Plan Note (Signed)
No ischemic symptoms.  Continue ASA (can decrease dose to 81 mg daily).  Continue statin and beta blocker.  I will check lipids today with goal LDL < 70.

## 2011-12-14 ENCOUNTER — Encounter (INDEPENDENT_AMBULATORY_CARE_PROVIDER_SITE_OTHER): Payer: Medicare Other

## 2011-12-14 DIAGNOSIS — I10 Essential (primary) hypertension: Secondary | ICD-10-CM

## 2011-12-14 DIAGNOSIS — R002 Palpitations: Secondary | ICD-10-CM

## 2011-12-14 DIAGNOSIS — E785 Hyperlipidemia, unspecified: Secondary | ICD-10-CM

## 2011-12-21 ENCOUNTER — Telehealth: Payer: Self-pay | Admitting: *Deleted

## 2011-12-21 NOTE — Telephone Encounter (Signed)
Dr Shirlee Latch reviewed monitor done 12/14/11. Frequent PACs, no more worrisome arrhythmia, no change to therapy unless he is more symptomatic. I spoke with pt. Palpitations may be some better since increasing diltiazem to 360mg  daily. Pt did not want to make any other medications adjustments at present . He will continue to avoid caffeine. He will monitor his symptoms and call if he changes his mind. He has follow up scheduled with Dr Shirlee Latch 01/14/12.

## 2012-01-12 ENCOUNTER — Ambulatory Visit: Payer: Medicare Other | Admitting: Internal Medicine

## 2012-01-14 ENCOUNTER — Ambulatory Visit (INDEPENDENT_AMBULATORY_CARE_PROVIDER_SITE_OTHER): Payer: Medicare Other | Admitting: Cardiology

## 2012-01-14 ENCOUNTER — Ambulatory Visit: Payer: Medicare Other | Admitting: Internal Medicine

## 2012-01-14 ENCOUNTER — Encounter: Payer: Self-pay | Admitting: Cardiology

## 2012-01-14 VITALS — BP 130/70 | HR 67 | Ht 66.0 in | Wt 129.0 lb

## 2012-01-14 DIAGNOSIS — I10 Essential (primary) hypertension: Secondary | ICD-10-CM

## 2012-01-14 DIAGNOSIS — R002 Palpitations: Secondary | ICD-10-CM

## 2012-01-14 DIAGNOSIS — E785 Hyperlipidemia, unspecified: Secondary | ICD-10-CM

## 2012-01-14 DIAGNOSIS — I2510 Atherosclerotic heart disease of native coronary artery without angina pectoris: Secondary | ICD-10-CM

## 2012-01-14 DIAGNOSIS — F17200 Nicotine dependence, unspecified, uncomplicated: Secondary | ICD-10-CM

## 2012-01-14 MED ORDER — METOPROLOL SUCCINATE ER 25 MG PO TB24: ORAL_TABLET | ORAL | Status: DC

## 2012-01-14 MED ORDER — TRAZODONE HCL 50 MG PO TABS: ORAL_TABLET | ORAL | Status: DC

## 2012-01-14 MED ORDER — DILTIAZEM HCL ER COATED BEADS 360 MG PO CP24: 360.0000 mg | ORAL_CAPSULE | Freq: Every day | ORAL | Status: DC

## 2012-01-14 MED ORDER — SIMVASTATIN 20 MG PO TABS: 20.0000 mg | ORAL_TABLET | Freq: Every day | ORAL | Status: DC

## 2012-01-14 MED ORDER — TRIAMTERENE-HCTZ 37.5-25 MG PO TABS: 1.0000 | ORAL_TABLET | Freq: Every day | ORAL | Status: DC

## 2012-01-14 NOTE — Patient Instructions (Addendum)
Your physician wants you to follow-up in: 6 months with Dr McLean.(February 2014) You will receive a reminder letter in the mail two months in advance. If you don't receive a letter, please call our office to schedule the follow-up appointment.  

## 2012-01-15 NOTE — Progress Notes (Signed)
Patient ID: [name], male   DOB: 1941/05/22, 71 y.o.   MRN: [id number] PCP: Dr. Drue Novel  71 yo with history of HTN, hyperlipidemia, nonobstructive CAD, and palpitations due to PACs/PVCs presents for followup. He has been on Toprol XL and diltiazem CD for the palpitations. At last appointment, he reported increased palpitations.  I had him increase diltiazem CD and got a holter monitor, showing frequent PACs (5.4% of total beats) with no evidence for atrial fibrillation. No exertional dyspnea or chest pain.  No syncope or lightheadedness.  He continues to be under a lot of stress taking care of his disabled wife.  I tried him on trazodone to help with his sleep (only sleeping 2-3 hours a night).  Sometimes this helps and sometimes it does not.  No ETOH or caffeine.  Increasing the diltiazem has helped control the symptoms somewhat but they are still bothersome.    Labs (11/10): TSH normal, LDL 80, HDL 43  Labs (11/12): K 4.1, creatinine 0.8, LDL 69, HDL 47 Labs (7/13): Mg 2.3, TSH normal, LDL 64, HDL 54, K 4, creatinine 0.8  Allergies (verified):  1) ! Sudafed  2) ! Lodine  3) ! Sulfa   Past Medical History:  1. Nonobstructive coronary artery disease. The patient's left heart catheterization done in 2005 showed 30% left main stenosis, an extensively calcified LAD with moderate plaquing, and EF was 60%. There was no obstructive disease.  2. Hypertension.  3. PAC/PVCs. Hotler (7/13) with frequent PACs (5.4% total beats), no atrial fibrillation noted.  4. Peptic ulcer disease.  5. Gastroesophageal reflux disease with history of stricture.  6. Hypertension.  7. Hyperlipidemia.  8. Tobacco abuse. The patient smokes pack a day. He has been off Chantix in the past. However, he did not want to use Chantix due to fear for side effects  9. Diverticulosis.  10. Prostate cancer status post radical prostatectomy in July 2009.  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 Depression Dx 3-09  13. Diastolic dysfunction:  Echo (2/10) with EF 60%, mild focal basal septal hypertrophy, moderate diastolic dysfunction.  14. OA right shoulder   Family History:  colon ca--aunt  prostate ca--no  MI--F at age 26   Social History:  Married, wife is disabled  2 kids  fully retired  Patient currently smokes 1ppd  Alcohol Use - no  Illicit Drug Use - no  Patient does not get regular exercise but is active    Current Outpatient Prescriptions  Medication Sig Dispense Refill  . aspirin EC 325 MG tablet Take 325 mg by mouth daily.      . Aspirin-Acetaminophen-Caffeine (EXCEDRIN PO) Take 2 tablets by mouth daily as needed.      . diltiazem (CARDIZEM CD) 360 MG 24 hr capsule Take 1 capsule (360 mg total) by mouth daily.  90 capsule  3  . HYDROcodone-acetaminophen (VICODIN) 5-500 MG per tablet Take 1 tablet by mouth every 6 (six) hours as needed. As needed one daily and one nightly as needed       . metoprolol succinate (TOPROL-XL) 25 MG 24 hr tablet TAKE 2 TABLETS BY MOUTH EVERY MORNING AND 1 TABLET BY MOUTH EVERY EVENING  270 tablet  3  . multivitamin (THERAGRAN) per tablet 1 tablet daily.        Marland Kitchen omeprazole (PRILOSEC) 20 MG capsule Take 1 capsule (20 mg total) by mouth 2 (two) times daily.  180 capsule  1  . simvastatin (ZOCOR) 20 MG tablet Take 1 tablet (20 mg total)  by mouth at bedtime.  90 tablet  3  . triamterene-hydrochlorothiazide (MAXZIDE-25) 37.5-25 MG per tablet Take 1 each (1 tablet total) by mouth daily.  90 tablet  3  . traZODone (DESYREL) 50 MG tablet 1 and 1/2 tablets at bedtime  135 tablet  3   BP 130/70  Pulse 67  Ht 5\' 6"  (1.676 m)  Wt 129 lb (58.514 kg)  BMI 20.82 kg/m2  SpO2 97% General: NAD Neck: No JVD, no thyromegaly or thyroid nodule.  Lungs: Clear to auscultation bilaterally with normal respiratory effort. CV: Nondisplaced PMI.  Heart regular S1/S2 with occasional premature beat, no S3/S4, no murmur.  No peripheral edema.  No carotid bruit.  Normal pedal pulses.  Abdomen: Soft,  nontender, no hepatosplenomegaly, no distention.  Skin: Intact without lesions or rashes.  Neurologic: Alert and oriented x 3.  Psych: Normal affect. Extremities: No clubbing or cyanosis.

## 2012-01-15 NOTE — Assessment & Plan Note (Signed)
No ischemic symptoms.  Continue ASA, statin and beta blocker.  LDL at goal (< 70) when recently checked.

## 2012-01-15 NOTE — Assessment & Plan Note (Signed)
Frequent PACs, no atrial fibrillation on recent holter.  Increasing diltiazem has helped but still feels palpitations.  I think that it would help for him to get more sleep.  I will have him increase the trazodone to 75 mg qhs as this has been helping.

## 2012-01-15 NOTE — Assessment & Plan Note (Signed)
I again counseled him to quit.  He is under a lot of stress with his ill wife and I am unsure how successful he will be at this time.  

## 2012-02-01 ENCOUNTER — Ambulatory Visit (INDEPENDENT_AMBULATORY_CARE_PROVIDER_SITE_OTHER): Payer: Medicare Other | Admitting: Internal Medicine

## 2012-02-01 ENCOUNTER — Encounter: Payer: Self-pay | Admitting: Internal Medicine

## 2012-02-01 VITALS — BP 136/80 | HR 61 | Temp 98.0°F | Wt 128.0 lb

## 2012-02-01 DIAGNOSIS — F17200 Nicotine dependence, unspecified, uncomplicated: Secondary | ICD-10-CM

## 2012-02-01 DIAGNOSIS — R109 Unspecified abdominal pain: Secondary | ICD-10-CM

## 2012-02-01 DIAGNOSIS — I10 Essential (primary) hypertension: Secondary | ICD-10-CM

## 2012-02-01 DIAGNOSIS — R634 Abnormal weight loss: Secondary | ICD-10-CM | POA: Insufficient documentation

## 2012-02-01 DIAGNOSIS — K5732 Diverticulitis of large intestine without perforation or abscess without bleeding: Secondary | ICD-10-CM

## 2012-02-01 DIAGNOSIS — K5792 Diverticulitis of intestine, part unspecified, without perforation or abscess without bleeding: Secondary | ICD-10-CM | POA: Insufficient documentation

## 2012-02-01 LAB — POCT URINALYSIS DIPSTICK
Glucose, UA: NEGATIVE
Nitrite, UA: NEGATIVE
Protein, UA: NEGATIVE
Urobilinogen, UA: 0.2

## 2012-02-01 MED ORDER — AMOXICILLIN-POT CLAVULANATE ER 1000-62.5 MG PO TB12: 2.0000 | ORAL_TABLET | Freq: Two times a day (BID) | ORAL | Status: AC

## 2012-02-01 NOTE — Assessment & Plan Note (Signed)
BP normal, no change

## 2012-02-01 NOTE — Assessment & Plan Note (Addendum)
See history of present illness, symptoms could be from diverticulitis, given all the comorbidities I recommend to start antibiotics before this get worse. Udip trace positive blood, urine culture pending Plan: Augmentin, see instructions

## 2012-02-01 NOTE — Patient Instructions (Signed)
Diverticulitis Small pockets or "bubbles" can develop in the wall of the intestine. Diverticulitis is when those pockets become infected and inflamed. This causes stomach pain (usually on the left side). HOME CARE  Take all medicine as told by your doctor.   Try a clear liquid diet (broth, tea, or water) for as long as told by your doctor.   Keep all follow-up visits with your doctor.   You may be put on a low-fiber diet once you start feeling better. Here are foods that have low-fiber:   White breads, cereals, rice, and pasta.   Cooked fruits and vegetables or soft fresh fruits and vegetables without the skin.   Ground or well-cooked tender beef, ham, veal, Lefevers, pork, or poultry.   Eggs and seafood.   After you are doing well on the low-fiber diet, you may be put on a high-fiber diet. Here are ways to increase your fiber:   Choose whole-grain breads, cereals, pasta, and brown rice.   Choose fruits and vegetables with skin on. Do not overcook the vegetables.   Choose nuts, seeds, legumes, dried peas, beans, and lentils.   Look for food products that have more than 3 grams of fiber per serving on the food label.  GET HELP RIGHT AWAY IF:  Your pain does not get better or gets worse.   You have trouble eating food.   You are not pooping (having bowel movements) like normal.   You have a temperature by mouth above 102 F (38.9 C), not controlled by medicine.   You keep throwing up (vomiting).   You have bloody or black, tarry poop (stools).   You are getting worse and not better.  MAKE SURE YOU:   Understand these instructions.   Will watch your condition.   Will get help right away if you are not doing well or get worse.  Document Released: 11/11/2007 Document Revised: 05/14/2011 Document Reviewed: 04/15/2009 [facility] Patient Information 2012 [place], [place].   Drink plenty of fluids Take antibiotics as prescribed for one week. Call if you are not improving in  the next few days, call if symptoms came back. Go to the ER if symptoms are severe, increases stomach pain, fever chills

## 2012-02-01 NOTE — Progress Notes (Signed)
  Subjective:    Patient ID: [name], male    DOB: 04-01-1941, 71 y.o.   MRN: [id number]  HPI Routine office visit High cholesterol, good medication compliance,   last cholesterol panel excellent. Tobacco,   still smoking CAD, asymptomatic, note from cardiology reviewed, he is a stable. Diverticulitis?  Last week for 24 hours and again since yesterday is having left lower quadrant abdominal pain, mild to moderate, had some diarrhea associated with it. In the past, he had diverticulitis, current symptoms resemble that process  Past Medical History: hyperlipidemia Hypertension Depression Dx  3-09 GERD (with stricture) and a h/o PUD Prostate cancer, hx of (11/2007)--prostatectomy 7-09...no XRT FATIGUE, CHRONIC ED Prostate cancer, permanent incontinence past surgery PEYRONIE'S DISEASE   Tobacco abuse.    Diverticulosis. CV ---2005 Cardiac Cath---> Nonobstructive CAD 30% left main stenosis, an extensively calcified LAD with moderate plaquing, and EF was 60%.  There was no obstructive disease. ---h/o palpitations , PAC/PVCs.   ---Diastolic dysfunction: Echo (2/10) with EF 60%, mild focal basal septal hypertrophy, moderate diastolic dysfunction.    Past Surgical History: Spinal fusion ('99, '06) Prostatectomy B hernia repair 10-11    Family History: colon ca--aunt prostate ca--no MI--F at age 32 DM-- no  Social History: Married, wife is disabled, pt is caregiver 2 kids fully retired   Tobacco--1ppd Alcohol Use - no Illicit Drug Use - no   Review of Systems No fever or chills No blood in the stools. Appetite normal. No dysuria, gross hematuria or difficulty urinating No chest pain or shortness of breath Has occasional palpitations, was eval by cardiology, trazodone was increased, it did help some.    Objective:   Physical Exam General -- alert, well-developed, and slightly underweight appearing. No apparent distress.  HEENT -- not pale or jaundice  Lungs --  normal respiratory effort, no intercostal retractions, no accessory muscle use, and normal breath sounds.   Heart-- normal rate, regular rhythm, no murmur, and no gallop.   Abdomen--not distended, soft, good bowel sounds, slightly tender at the left lower quadrant without mass or rebound Extremities-- no pretibial edema bilaterally  Neurologic-- alert & oriented X3 and strength normal in all extremities. Psych-- Cognition and judgment appear intact. Alert and cooperative with normal attention span and concentration.  not anxious appearing and not depressed appearing.       Assessment & Plan:

## 2012-02-01 NOTE — Assessment & Plan Note (Addendum)
Counseled, he is however not willing to try quitting at this time

## 2012-02-01 NOTE — Assessment & Plan Note (Signed)
Mild weight loss from a already low baseline.  BMI 20. Pt thinks related to stress in general due to his wife's health. TSH and blood sugars has been consistently normal. Plan: Observation

## 2012-02-02 LAB — URINE CULTURE: Organism ID, Bacteria: NO GROWTH

## 2012-03-24 ENCOUNTER — Other Ambulatory Visit: Payer: Self-pay | Admitting: Internal Medicine

## 2012-03-24 NOTE — Telephone Encounter (Signed)
Refill done.  

## 2012-04-21 ENCOUNTER — Other Ambulatory Visit: Payer: Self-pay | Admitting: Orthopaedic Surgery

## 2012-04-21 DIAGNOSIS — M5136 Other intervertebral disc degeneration, lumbar region: Secondary | ICD-10-CM

## 2012-04-28 ENCOUNTER — Ambulatory Visit
Admission: RE | Admit: 2012-04-28 | Discharge: 2012-04-28 | Disposition: A | Discharge status: Home / Self Care | Payer: Medicare Other | Source: Ambulatory Visit | Attending: Orthopaedic Surgery | Admitting: Orthopaedic Surgery

## 2012-04-28 DIAGNOSIS — M5136 Other intervertebral disc degeneration, lumbar region: Secondary | ICD-10-CM

## 2012-04-29 ENCOUNTER — Ambulatory Visit: Payer: Medicare Other | Admitting: Internal Medicine

## 2012-05-10 ENCOUNTER — Telehealth: Payer: Self-pay | Admitting: Cardiology

## 2012-05-10 NOTE — Telephone Encounter (Signed)
plz return call to pt [phone] regarding questions about medication

## 2012-05-10 NOTE — Telephone Encounter (Signed)
Spoke with pt about prescription for Diltiazem.

## 2012-05-10 NOTE — Telephone Encounter (Signed)
Pt in doughnut hole and is going to be paying for Diltiazem CD 360 daily out of pocket. I offered to send prescription in to another pharmacy if he could get it for less than at [facility]. Pt states he would let me know.

## 2012-06-08 DIAGNOSIS — Z87442 Personal history of urinary calculi: Secondary | ICD-10-CM

## 2012-06-08 HISTORY — DX: Personal history of urinary calculi: Z87.442

## 2012-07-18 ENCOUNTER — Encounter: Payer: Medicare Other | Admitting: Internal Medicine

## 2012-08-24 ENCOUNTER — Telehealth: Payer: Self-pay | Admitting: *Deleted

## 2012-08-24 MED ORDER — OMEPRAZOLE 20 MG PO CPDR: DELAYED_RELEASE_CAPSULE | ORAL | Status: DC

## 2012-08-24 NOTE — Telephone Encounter (Signed)
Refill done.  

## 2012-11-09 ENCOUNTER — Encounter: Payer: Self-pay | Admitting: Cardiology

## 2012-11-09 ENCOUNTER — Ambulatory Visit (INDEPENDENT_AMBULATORY_CARE_PROVIDER_SITE_OTHER): Payer: Medicare Other | Admitting: Cardiology

## 2012-11-09 VITALS — BP 124/74 | HR 63 | Ht 66.0 in | Wt 128.0 lb

## 2012-11-09 DIAGNOSIS — F17200 Nicotine dependence, unspecified, uncomplicated: Secondary | ICD-10-CM

## 2012-11-09 DIAGNOSIS — I2510 Atherosclerotic heart disease of native coronary artery without angina pectoris: Secondary | ICD-10-CM

## 2012-11-09 DIAGNOSIS — R079 Chest pain, unspecified: Secondary | ICD-10-CM

## 2012-11-09 DIAGNOSIS — E785 Hyperlipidemia, unspecified: Secondary | ICD-10-CM

## 2012-11-09 NOTE — Patient Instructions (Addendum)
Your physician has requested that you have en exercise stress myoview. For further information please visit [email or web address] Please follow instruction sheet, as given.  Your physician recommends that you schedule a follow-up appointment in: 3 months with Dr Shirlee Latch.

## 2012-11-09 NOTE — Progress Notes (Signed)
Patient ID: [name], male   DOB: [birth year], 72 y.o.   MRN: [id number] PCP: Dr. Larina Bras  72 yo with history of HTN, hyperlipidemia, nonobstructive CAD, and palpitations due to PACs/PVCs presents for followup. He has been on Toprol XL and diltiazem CD for the palpitations.  Palpitations have been relatively quiescent.  No exertional dyspnea.  No syncope or lightheadedness.  He continues to be under a lot of stress taking care of his disabled wife.  About 2 months ago, he began to develop episodes of chest aching.  It felt like someone "sticking his finger" into his chest.  He had multiple episodes over a 3-4 week period, usually lasting a few minutes to several hours at a time.  None were exertional. They now seem to have resolved.  Also of note, he has stopped his statin.  His legs started to feel weak, and he attributed this to the simvastatin and stopped it.  His PCP gave him atorvastatin, but he did not start it.  He continues to smoke.   Labs (11/10): TSH normal, LDL 80, HDL 43  Labs (11/12): K 4.1, creatinine 0.8, LDL 69, HDL 47 Labs (7/13): Mg 2.3, TSH normal, LDL 64, HDL 54, K 4, creatinine 0.9  Allergies (verified):  1) ! Sudafed  2) ! Lodine  3) ! Sulfa   Past Medical History:  1. Nonobstructive coronary artery disease. The patient's left heart catheterization done in 2005 showed 30% left main stenosis, an extensively calcified LAD with moderate plaquing, and EF was 60%. There was no obstructive disease.  2. Hypertension.  3. PAC/PVCs. Hotler (7/13) with frequent PACs (5.4% total beats), no atrial fibrillation noted.  4. Peptic ulcer disease.  5. Gastroesophageal reflux disease with history of stricture.  6. Hypertension.  7. Hyperlipidemia.  8. Tobacco abuse. The patient smokes pack a day. He has been off Chantix in the past. However, he did not want to use Chantix due to fear for side effects  9. Diverticulosis.  10. Prostate cancer status post radical prostatectomy in July 2009.   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 Depression Dx 3-09  13. Diastolic dysfunction: Echo (2/10) with EF 60%, mild focal basal septal hypertrophy, moderate diastolic dysfunction.  14. OA right shoulder   Family History:  colon ca--aunt  prostate ca--no  MI--F at age 72   Social History:  Married, wife is disabled  2 kids  fully retired  Patient currently smokes 1ppd  Alcohol Use - no  Illicit Drug Use - no  Patient does not get regular exercise but is active   ROS: All systems reviewed and negative except as per HPI.    Current Outpatient Prescriptions  Medication Sig Dispense Refill  . aspirin EC 325 MG tablet Take 325 mg by mouth daily.      . Aspirin-Acetaminophen-Caffeine (EXCEDRIN PO) Take 2 tablets by mouth daily as needed.      . diltiazem (CARDIZEM CD) 360 MG 24 hr capsule Take 1 capsule (360 mg total) by mouth daily.  90 capsule  3  . HYDROcodone-acetaminophen (VICODIN) 5-500 MG per tablet Take 1 tablet by mouth every 6 (six) hours as needed. As needed one daily and one nightly as needed       . metoprolol succinate (TOPROL-XL) 25 MG 24 hr tablet TAKE 2 TABLETS BY MOUTH EVERY MORNING AND 1 TABLET BY MOUTH EVERY EVENING  270 tablet  3  . multivitamin (THERAGRAN) per tablet 1 tablet daily.        Marland Kitchen  omeprazole (PRILOSEC) 20 MG capsule TAKE 1 CAPSULE BY MOUTH TWICE DAILY  180 capsule  0  . traZODone (DESYREL) 50 MG tablet 1 and 1/2 tablets at bedtime  135 tablet  3   No current facility-administered medications for this visit.   BP 124/74  Pulse 63  Ht 5\' 6"  (1.676 m)  Wt 128 lb (58.06 kg)  BMI 20.67 kg/m2 General: NAD, thin Neck: No JVD, no thyromegaly or thyroid nodule.  Lungs: Clear to auscultation bilaterally with normal respiratory effort. CV: Nondisplaced PMI.  Heart regular S1/S2 with occasional premature beat, no S3/S4, no murmur.  No peripheral edema.  No carotid bruit.  Normal pedal pulses.  Abdomen: Soft, nontender, no hepatosplenomegaly, no distention.   Neurologic: Alert and oriented x 3.  Psych: Normal affect. Extremities: No clubbing or cyanosis.   Assessment/Plan: 1. Palpitations:  Due to PACS/PVCs.  This seems to have mostly resolved on Toprol XL and diltiazem CD.   2. Hyperlipidemia: I would like to see him on a statin given nonobstructive CAD.  He did not tolerate simvastatin due to leg weakness.  He is not eager to restart a statin.  I will hold off on statin for 3 months.  I will see him back at that time and will try to start him on pravastatin 20 mg daily.  3. CAD: Nonobstructive CAD on last cath.  Patient has had a number of episodes of atypical chest pain. Continue ASA.  ETT-Sestamibi for risk stratification.  4. Smoking: I again encouraged him to stop smoking.  I do not think he is ready.   Marca Ancona 11/09/2012

## 2012-11-21 ENCOUNTER — Other Ambulatory Visit: Payer: Self-pay | Admitting: *Deleted

## 2012-11-21 DIAGNOSIS — I10 Essential (primary) hypertension: Secondary | ICD-10-CM

## 2012-11-21 DIAGNOSIS — E785 Hyperlipidemia, unspecified: Secondary | ICD-10-CM

## 2012-11-21 MED ORDER — DILTIAZEM HCL ER COATED BEADS 360 MG PO CP24: 360.0000 mg | ORAL_CAPSULE | Freq: Every day | ORAL | Status: DC

## 2012-11-23 ENCOUNTER — Encounter (HOSPITAL_COMMUNITY): Payer: Medicare Other

## 2012-12-13 ENCOUNTER — Ambulatory Visit (HOSPITAL_COMMUNITY): Payer: Medicare Other | Attending: Cardiology | Admitting: Radiology

## 2012-12-13 VITALS — BP 117/55 | Ht 66.0 in | Wt 125.0 lb

## 2012-12-13 DIAGNOSIS — R5381 Other malaise: Secondary | ICD-10-CM | POA: Insufficient documentation

## 2012-12-13 DIAGNOSIS — Z8249 Family history of ischemic heart disease and other diseases of the circulatory system: Secondary | ICD-10-CM | POA: Insufficient documentation

## 2012-12-13 DIAGNOSIS — F17200 Nicotine dependence, unspecified, uncomplicated: Secondary | ICD-10-CM | POA: Insufficient documentation

## 2012-12-13 DIAGNOSIS — R002 Palpitations: Secondary | ICD-10-CM | POA: Insufficient documentation

## 2012-12-13 DIAGNOSIS — I10 Essential (primary) hypertension: Secondary | ICD-10-CM | POA: Insufficient documentation

## 2012-12-13 DIAGNOSIS — R5383 Other fatigue: Secondary | ICD-10-CM | POA: Insufficient documentation

## 2012-12-13 DIAGNOSIS — R079 Chest pain, unspecified: Secondary | ICD-10-CM

## 2012-12-13 DIAGNOSIS — E785 Hyperlipidemia, unspecified: Secondary | ICD-10-CM | POA: Insufficient documentation

## 2012-12-13 DIAGNOSIS — I2510 Atherosclerotic heart disease of native coronary artery without angina pectoris: Secondary | ICD-10-CM

## 2012-12-13 MED ORDER — REGADENOSON 0.4 MG/5ML IV SOLN
0.4000 mg | Freq: Once | INTRAVENOUS | Status: AC
  Administered 2012-12-13: 0.4 mg via INTRAVENOUS

## 2012-12-13 MED ORDER — TECHNETIUM TC 99M SESTAMIBI GENERIC - CARDIOLITE
30.0000 | Freq: Once | INTRAVENOUS | Status: AC | PRN
  Administered 2012-12-13: 30 via INTRAVENOUS

## 2012-12-13 MED ORDER — TECHNETIUM TC 99M SESTAMIBI GENERIC - CARDIOLITE
10.0000 | Freq: Once | INTRAVENOUS | Status: AC | PRN
  Administered 2012-12-13: 10 via INTRAVENOUS

## 2012-12-13 NOTE — Progress Notes (Addendum)
[facility] SITE 3 NUCLEAR MED [address] [place], [place] [address] [phone]    Cardiology Nuclear Med Study  [name] is a 72 y.o. male     MRN : [id number]     DOB: 1940-09-28  Procedure Date: 12/13/2012  Nuclear Med Background Indication for Stress Test:  Evaluation for Ischemia History:  '05 Heart Cath N/O CAD  (L) Main extensive Calcified LAD mod plaque EF: 60%, '07 MPS: EF: 68%, NL, '10 ECHO: EF; 60% mod diastolic dysfunction Cardiac Risk Factors: Family History - CAD, Hypertension, Lipids and Smoker  Symptoms:  Fatigue and Palpitations   Nuclear Pre-Procedure Caffeine/Decaff Intake:  None> 12 hrs NPO After: 10:00pm   Lungs:  clear O2 Sat: 95% on room air. IV 0.9% NS with Angio Cath:  20g  IV Site: R Antecubital x 1, tolerated well IV Started by:  Irean Hong, RN  Chest Size (in):  38 Cup Size: n/a  Height: 5\' 6"  (1.676 m)  Weight:  125 lb (56.7 kg)  BMI:  Body mass index is 20.19 kg/(m^2). Tech Comments:  Held Toprol and Excedrin x 24 hrs; took Diltiazem this am    Nuclear Med Study 1 or 2 day study: 1 day  Stress Test Type:  Lexiscan  Reading MD: Olga Millers, MD  Order Authorizing Provider:  Marca Ancona, MD  Resting Radionuclide: Technetium 109m Sestamibi  Resting Radionuclide Dose: 11.0 mCi   Stress Radionuclide:  Technetium 29m Sestamibi  Stress Radionuclide Dose: 33.0 mCi           Stress Protocol Rest HR: 53 Stress HR: 76  Rest BP: 117/55 Stress BP: 137/60  Exercise Time (min): n/a METS: n/a   Predicted Max HR: 147 bpm % Max HR: 51.7 bpm Rate Pressure Product: 40102   Dose of Adenosine (mg):  n/a Dose of Lexiscan: 0.4 mg  Dose of Atropine (mg): n/a Dose of Dobutamine: n/a mcg/kg/min (at max HR)  Stress Test Technologist: Milana Na, EMT-P  Nuclear Technologist:  Harlow Asa, CNMT     Rest Procedure:  Myocardial perfusion imaging was performed at rest 45 minutes following the intravenous administration of Technetium  61m Sestamibi. Rest ECG: Sinus bradycardia.  Stress Procedure:  The patient received IV Lexiscan 0.4 mg over 15-seconds.  Technetium 3m Sestamibi injected at 30-seconds. This patient had sob and abdominal pain with the lexiscan injection. Quantitative spect images were obtained after a 45 minute delay. Stress ECG: No significant ST segment change suggestive of ischemia.  QPS Raw Data Images:  Acquisition technically good; normal left ventricular size. Stress Images:  There is decreased uptake in the inferior wall. Rest Images:  There is decreased uptake in the inferior wall. Subtraction (SDS):  No evidence of ischemia. Transient Ischemic Dilatation (Normal <1.22):  n/a Lung/Heart Ratio (Normal <0.45):  0.24  Quantitative Gated Spect Images QGS EDV:  71 ml QGS ESV:  24 ml  Impression Exercise Capacity:  Lexiscan with no exercise. BP Response:  Normal blood pressure response. Clinical Symptoms:  There is dyspnea. ECG Impression:  No significant ST segment change suggestive of ischemia. Comparison with Prior Nuclear Study: Compared to 02/01/06 inferior thinning is new.  Overall Impression:  Low risk stress nuclear study with a small, mild intensity, fixed inferior defect consistent with thinning; no ischemia.  LV Ejection Fraction: 66%.  LV Wall Motion:  NL LV Function; NL Wall Motion   [name]  Small fixed inferior defect with no ischemia.  Low risk study.  Please inform  patient.   Marca Ancona 12/20/2012

## 2012-12-21 NOTE — Progress Notes (Signed)
Pt.notified

## 2013-01-25 ENCOUNTER — Encounter (HOSPITAL_COMMUNITY): Payer: Self-pay | Admitting: Pharmacy Technician

## 2013-01-25 ENCOUNTER — Other Ambulatory Visit: Payer: Self-pay | Admitting: Urology

## 2013-01-27 ENCOUNTER — Encounter (HOSPITAL_COMMUNITY): Payer: Self-pay | Admitting: *Deleted

## 2013-01-30 ENCOUNTER — Ambulatory Visit (HOSPITAL_COMMUNITY)
Admission: RE | Admit: 2013-01-30 | Discharge: 2013-01-30 | Disposition: A | Discharge status: Home / Self Care | Payer: Medicare Other | Source: Ambulatory Visit | Attending: Urology | Admitting: Urology

## 2013-01-30 ENCOUNTER — Encounter (HOSPITAL_COMMUNITY): Payer: Self-pay | Admitting: General Practice

## 2013-01-30 ENCOUNTER — Encounter (HOSPITAL_COMMUNITY)
Admission: RE | Disposition: A | Discharge status: Home / Self Care | Payer: Self-pay | Source: Ambulatory Visit | Attending: Urology

## 2013-01-30 ENCOUNTER — Ambulatory Visit (HOSPITAL_COMMUNITY): Payer: Medicare Other

## 2013-01-30 DIAGNOSIS — Z79899 Other long term (current) drug therapy: Secondary | ICD-10-CM | POA: Insufficient documentation

## 2013-01-30 DIAGNOSIS — I499 Cardiac arrhythmia, unspecified: Secondary | ICD-10-CM | POA: Insufficient documentation

## 2013-01-30 DIAGNOSIS — K219 Gastro-esophageal reflux disease without esophagitis: Secondary | ICD-10-CM | POA: Insufficient documentation

## 2013-01-30 DIAGNOSIS — N201 Calculus of ureter: Secondary | ICD-10-CM | POA: Insufficient documentation

## 2013-01-30 DIAGNOSIS — I10 Essential (primary) hypertension: Secondary | ICD-10-CM | POA: Insufficient documentation

## 2013-01-30 DIAGNOSIS — E7800 Pure hypercholesterolemia, unspecified: Secondary | ICD-10-CM | POA: Insufficient documentation

## 2013-01-30 DIAGNOSIS — Z8546 Personal history of malignant neoplasm of prostate: Secondary | ICD-10-CM | POA: Insufficient documentation

## 2013-01-30 HISTORY — DX: Personal history of urinary calculi: Z87.442

## 2013-01-30 HISTORY — PX: EXTRACORPOREAL SHOCK WAVE LITHOTRIPSY: SHX1557

## 2013-01-30 SURGERY — LITHOTRIPSY, ESWL
Anesthesia: LOCAL | Laterality: Right

## 2013-01-30 MED ORDER — SODIUM CHLORIDE 0.9 % IJ SOLN: 3.0000 mL | INTRAMUSCULAR | Status: DC | PRN

## 2013-01-30 MED ORDER — ACETAMINOPHEN 650 MG RE SUPP
650.0000 mg | RECTAL | Status: DC | PRN
  Filled 2013-01-30: qty 1

## 2013-01-30 MED ORDER — SODIUM CHLORIDE 0.9 % IJ SOLN
3.0000 mL | Freq: Two times a day (BID) | INTRAMUSCULAR | Status: DC
Start: 2013-01-30 — End: 2013-01-30

## 2013-01-30 MED ORDER — ACETAMINOPHEN 325 MG PO TABS: 650.0000 mg | ORAL_TABLET | ORAL | Status: DC | PRN

## 2013-01-30 MED ORDER — FENTANYL CITRATE 0.05 MG/ML IJ SOLN: 25.0000 ug | INTRAMUSCULAR | Status: DC | PRN

## 2013-01-30 MED ORDER — CIPROFLOXACIN HCL 500 MG PO TABS
500.0000 mg | ORAL_TABLET | ORAL | Status: AC
  Administered 2013-01-30: 500 mg via ORAL
  Filled 2013-01-30: qty 1

## 2013-01-30 MED ORDER — OXYCODONE HCL 5 MG PO TABS: 5.0000 mg | ORAL_TABLET | ORAL | Status: DC | PRN

## 2013-01-30 MED ORDER — HYDROMORPHONE HCL 2 MG PO TABS: 2.0000 mg | ORAL_TABLET | ORAL | Status: DC | PRN

## 2013-01-30 MED ORDER — DIAZEPAM 5 MG PO TABS
10.0000 mg | ORAL_TABLET | ORAL | Status: AC
  Administered 2013-01-30: 10 mg via ORAL
  Filled 2013-01-30: qty 2

## 2013-01-30 MED ORDER — DEXTROSE-NACL 5-0.45 % IV SOLN
INTRAVENOUS | Status: DC
  Administered 2013-01-30: 16:00:00 via INTRAVENOUS

## 2013-01-30 MED ORDER — ONDANSETRON HCL 4 MG/2ML IJ SOLN: 4.0000 mg | Freq: Four times a day (QID) | INTRAMUSCULAR | Status: DC | PRN

## 2013-01-30 MED ORDER — SODIUM CHLORIDE 0.9 % IV SOLN: 250.0000 mL | INTRAVENOUS | Status: DC | PRN

## 2013-01-30 MED ORDER — DIPHENHYDRAMINE HCL 25 MG PO CAPS
25.0000 mg | ORAL_CAPSULE | ORAL | Status: AC
  Administered 2013-01-30: 25 mg via ORAL
  Filled 2013-01-30: qty 1

## 2013-01-30 NOTE — Progress Notes (Signed)
Patient was able to void. Urine is red in color. Patient is stable at discharge. Patient's daughter is at bedside. Patient is accompanied by nursing staff to lobby. Patient tolerated saltine crackers and soda.

## 2013-01-30 NOTE — H&P (Signed)
eason For Visit  [name] presents today after a kidney stone was discovered on recent CT   History of Present Illness   [name] has a history of prostate cancer.  He had an open radical prostatectomy for T2a Gleason 7 disease in 7/09.  His PSA remains undetectible.  He had postop incontinence and got a male sling in 5/11.  He has persistant ED.   Interval history: [name] had a CT on 01/11/13 for low back pain and an adrenal mass.  The CT revealed a 9 mm obstructing proximal right ureteral stone.  This is [name] second stone as he passed a stone spontaneously last year.  He presents today to discuss treatment of this stone.  He has ongoing low back pain x 2-3 weeks.  Nothing makes this better or worse but it is not bothersome to require pain medication.  He denies flank pain, new LUTS, dysuria, hematuria, n/v, or f/c.  He is otherwise without complaints today.   Past Medical History Problems  1. History of  Abdominal Pain In The Right Lower Belly (RLQ) 789.03 2. History of  Acute Cystitis 595.0 3. History of  Arthritis V13.4 4. History of  Balanitis 607.1 5. History of  Coronary Angiography, W/O Concomitant Left Heart Catheteriz 6. History of  Depression 311 7. History of  Diverticulitis Of Colon 562.11 8. History of  Esophageal Reflux 530.81 9. History of  Heart Disease 429.9 10. History of  Hypercholesterolemia 272.0 11. History of  Hypertension 401.9 12. History of  Incomplete Emptying Of Bladder 788.21 13. History of  Male Stress Incontinence 788.32 14. History of  Malignant Prostatic Neoplasm T2a 15. History of  Peptic Ulcer V12.71 16. History of  Prostate Cancer V10.46 17. History of  Prostate Hard Area Or Nodule With Postvoiding Residual Urine 600.11 18. History of  PSA,Elevated 790.93 19. History of  Rotator Cuff Tendonitis 726.10  Surgical History Problems  1. History of  Lumbar Vertebral Fusion 2. History of  Operation For Correction Of Male Urinary Incontinence 3.  History of  Prostatect Retropubic Radical W/ Nerve Sparing Laparoscopic 4. History of  Tonsillectomy 5. History of  Umbilical Hernia Repair  Current Meds 1. Diltiazem HCl ER Coated Beads 360 MG Oral Capsule Extended Release 24 Hour; Therapy:  08Aug2013 to 2. Metoprolol Tartrate TABS; Therapy: (Recorded:26May2010) to 3. Multi-Vitamin TABS; Therapy: (Recorded:17Apr2009) to 4. Omeprazole 20 MG Oral Capsule Delayed Release; Therapy: 18Oct2010 to 5. TraZODone HCl 50 MG Oral Tablet; Therapy: 08Aug2013 to 6. Vicodin 5-500 MG TABS; Therapy: (Recorded:30Jul2009) to 7. Vicodin TABS; Therapy: (Recorded:13Aug2014) to  Allergies Medication  1. PredniSONE TABS 2. Sudafed TABS 3. Sulfa Drugs  Family History Problems  1. Paternal history of  Acute Myocardial Infarction V17.3 2. Paternal history of  Aortic Aneurysm 3. Paternal history of  Death In The Family Father AneurysmAge 66 4. Maternal history of  Death In The Family Mother EmphysemaAge 2 5. Maternal history of  Emphysema 6. Family history of  Family Health Status Number Of Children One son and one daughter  Social History Problems  1. Activities Of Daily Living 2. Exercise Habits Daily walking 3. Living Independently With Spouse He is her caregiver 4. Marital History - Currently Married 5. Retired From Work 6. Self-reliant In Usual Daily Activities 7. Tobacco Use V15.82 Smokes 1 ppd for 9yrs Denied  8. Alcohol Use 9. Caffeine Use  Review of Systems Genitourinary, constitutional, musculoskeletal and gastrointestinal system(s) were reviewed and pertinent findings if present are noted.  Genitourinary: urinary frequency and nocturia.  Musculoskeletal: back pain.    Vitals Vital Signs [Data Includes: Last 1 Day]  13Aug2014 11:13AM  Blood Pressure: 140 / 69 Temperature: 98.2 F Heart Rate: 61  Physical Exam Constitutional: Well nourished and well developed . No acute distress.  Pulmonary: No respiratory distress and  normal respiratory rhythm and effort.  Abdomen: The abdomen is soft and nontender. No CVA tenderness.  Neuro/Psych:. Mood and affect are appropriate.    Results/Data Urine [Data Includes: Last 1 Day]   13Aug2014  COLOR YELLOW   APPEARANCE CLEAR   SPECIFIC GRAVITY 1.020   pH 6.0   GLUCOSE NEG mg/dL  BILIRUBIN NEG   KETONE NEG mg/dL  BLOOD LARGE   PROTEIN NEG mg/dL  UROBILINOGEN 0.2 mg/dL  NITRITE NEG   LEUKOCYTE ESTERASE TRACE   SQUAMOUS EPITHELIAL/HPF NONE SEEN   WBC 3-6 WBC/hpf  RBC 11-20 RBC/hpf  BACTERIA NONE SEEN   CRYSTALS NONE SEEN   CASTS NONE SEEN    The following images/tracing/specimen were independently visualized: Marland Kitchen  KUB: large stone seen in proximal right ureter, no other obvious stones seen although image grossly ostructed by bowel    Assessment Assessed  1. Nephrolithiasis Of The Right Kidney 592.0   [name] has a large right proximal ureteral stone.  I do not think he would be able to pass this and he does not desire to try.  He requests treatment.  We discussed both ESWL and ureteroscopy today- pros and cons of both.  He is on board for whatever treatment Dr. Annabell Howells suggests.  I will send the image to Dr. Annabell Howells and inform Mr. Calderone of the decision.   Plan Health Maintenance (V70.0)  1. UA With REFLEX  Done: 13Aug2014 11:05AM Nephrolithiasis Of The Right Kidney (592.0)  2. KUB  Done: 13Aug2014 12:00AM   1. Treatment for large right proximal ureteral stone per Dr. Belva Crome suggestion

## 2013-01-30 NOTE — Interval H&P Note (Signed)
History and Physical Interval Note:   His stone is unchanged on KUB today.     He elected ESWL and the risks were reviewed.  01/30/2013 5:18 PM  [name]  has presented today for surgery, with the diagnosis of Right Proximal Ureteral Stone  The various methods of treatment have been discussed with the patient and family. After consideration of risks, benefits and other options for treatment, the patient has consented to  Procedure(s): RIGHT EXTRACORPOREAL SHOCK WAVE LITHOTRIPSY (ESWL) (Right) as a surgical intervention .  The patient's history has been reviewed, patient examined, no change in status, stable for surgery.  I have reviewed the patient's chart and labs.  Questions were answered to the patient's satisfaction.     Esme Freund J

## 2013-02-08 ENCOUNTER — Ambulatory Visit: Payer: Medicare Other | Admitting: Cardiology

## 2013-02-17 ENCOUNTER — Ambulatory Visit: Payer: Medicare Other | Admitting: Cardiology

## 2013-04-19 ENCOUNTER — Ambulatory Visit: Payer: Medicare Other | Admitting: Cardiology

## 2013-06-02 ENCOUNTER — Other Ambulatory Visit: Payer: Self-pay | Admitting: Cardiology

## 2013-06-05 ENCOUNTER — Other Ambulatory Visit: Payer: Self-pay

## 2013-06-05 ENCOUNTER — Telehealth: Payer: Self-pay | Admitting: Physician Assistant

## 2013-06-05 MED ORDER — DILTIAZEM HCL ER COATED BEADS 360 MG PO CP24: 360.0000 mg | ORAL_CAPSULE | Freq: Every morning | ORAL | Status: DC

## 2013-06-05 NOTE — Telephone Encounter (Signed)
Clovis Riley, pharmacist from [facility] called after-hours to clarify a prescription. The patient is on Taztia XT 360mg  daily chronically (previously prescribed from our office per their records). Rx was sent in 06/05/13 from our office for Cardizem CD 360mg  daily qam. Clovis Riley requested permission to change rx to Peyton Bottoms XT since that is what patient had been taking - same dose, same long-acting formulation. Permission granted for this switch to continue current regimen. Armin Yerger PA-C

## 2013-06-06 ENCOUNTER — Encounter: Payer: Self-pay | Admitting: Cardiology

## 2013-06-13 ENCOUNTER — Ambulatory Visit: Payer: Medicare Other | Admitting: Cardiology

## 2013-06-30 ENCOUNTER — Other Ambulatory Visit: Payer: Self-pay

## 2013-06-30 MED ORDER — DILTIAZEM HCL ER BEADS 360 MG PO CP24: 360.0000 mg | ORAL_CAPSULE | Freq: Every day | ORAL | Status: DC

## 2013-07-03 ENCOUNTER — Ambulatory Visit: Payer: Medicare Other | Admitting: Cardiology

## 2013-08-21 ENCOUNTER — Encounter: Payer: Self-pay | Admitting: Cardiology

## 2013-08-21 ENCOUNTER — Ambulatory Visit: Payer: Medicare Other | Admitting: Cardiology

## 2013-08-21 ENCOUNTER — Ambulatory Visit (INDEPENDENT_AMBULATORY_CARE_PROVIDER_SITE_OTHER): Payer: Commercial Managed Care - HMO | Admitting: Cardiology

## 2013-08-21 VITALS — BP 132/72 | HR 71 | Ht 65.0 in | Wt 129.0 lb

## 2013-08-21 DIAGNOSIS — F17200 Nicotine dependence, unspecified, uncomplicated: Secondary | ICD-10-CM

## 2013-08-21 DIAGNOSIS — E785 Hyperlipidemia, unspecified: Secondary | ICD-10-CM

## 2013-08-21 DIAGNOSIS — I2510 Atherosclerotic heart disease of native coronary artery without angina pectoris: Secondary | ICD-10-CM

## 2013-08-21 MED ORDER — TRAZODONE HCL 50 MG PO TABS: 50.0000 mg | ORAL_TABLET | Freq: Every day | ORAL | Status: DC

## 2013-08-21 MED ORDER — PRAVASTATIN SODIUM 40 MG PO TABS: 40.0000 mg | ORAL_TABLET | Freq: Every evening | ORAL | Status: DC

## 2013-08-21 NOTE — Patient Instructions (Signed)
Start pravastatin 40mg  daily in the evening.   Your physician recommends that you return for a FASTING lipid profile /liver profile/BMET in 3 months.   Your physician wants you to follow-up in: 1 year with Dr Aundra Dubin. (March 2016). You will receive a reminder letter in the mail two months in advance. If you don't receive a letter, please call our office to schedule the follow-up appointment.

## 2013-08-21 NOTE — Progress Notes (Signed)
Patient ID: [name], male   DOB: 1940-09-18, 73 y.o.   MRN: [id number] PCP: Dr. Joaquim Lai  73 yo with history of HTN, hyperlipidemia, nonobstructive CAD, and palpitations due to PACs/PVCs presents for followup. He has been on Toprol XL and diltiazem CD for the palpitations.  Palpitations have been relatively quiescent.  No exertional dyspnea.  No syncope or lightheadedness.  He continues to be under a lot of stress taking care of his disabled wife.  He does tire easily with exertion.  He had a Lexiscan Cardiolite in 7/14 for atypical chest pain that was low risk.  He continues to smoke.  He is not taking a statin because atorvastatin seemed to cause leg weakness.  However, he also has lumbar degenerative disc disease that may be causing the leg weakness and stopping the statin really did not help at all.  Labs (11/10): TSH normal, LDL 80, HDL 43  Labs (11/12): K 4.1, creatinine 0.8, LDL 69, HDL 47 Labs (7/13): Mg 2.3, TSH normal, LDL 64, HDL 54, K 4, creatinine 0.9  ECG: NSR, normal  Allergies (verified):  1) ! Sudafed  2) ! Lodine  3) ! Sulfa   Past Medical History:  1. Nonobstructive coronary artery disease: The patient's left heart catheterization done in 2005 showed 30% left main stenosis, an extensively calcified LAD with moderate plaquing, and EF was 60%. There was no obstructive disease.  Lexiscan Cardiolite in 7/14 showed a small, fixed inferior defect with no ischemia, EF 66% (low risk study).  2. Hypertension.  3. PAC/PVCs. [place] (7/13) with frequent PACs (5.4% total beats), no atrial fibrillation noted.  4. Peptic ulcer disease.  5. Gastroesophageal reflux disease with history of stricture.  6. Hypertension.  7. Hyperlipidemia.  8. Tobacco abuse. The patient smokes pack a day. He has been off Chantix in the past. However, he did not want to use Chantix due to fear for side effects  9. Diverticulosis.  10. Prostate cancer status post radical prostatectomy in July 2009.  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 Depression Dx 3-09  13. Diastolic dysfunction: Echo (2/10) with EF 60%, mild focal basal septal hypertrophy, moderate diastolic dysfunction.  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 Lumber degenerative disc disease  Family History:  colon ca--aunt  prostate ca--no  MI--F at age 73   Social History:  Married, wife is disabled  2 kids  fully retired  Patient currently smokes 1ppd  Alcohol Use - no  Illicit Drug Use - no  Patient does not get regular exercise but is active   ROS: All systems reviewed and negative except as per HPI.    Current Outpatient Prescriptions  Medication Sig Dispense Refill  . aspirin-acetaminophen-caffeine (EXCEDRIN MIGRAINE) 250-250-65 MG per tablet Take 1 tablet by mouth every 6 (six) hours as needed for pain.      Marland Kitchen diltiazem (TAZTIA XT) 360 MG 24 hr capsule Take 1 capsule (360 mg total) by mouth daily.  90 capsule  1  . HYDROcodone-acetaminophen (VICODIN) 5-500 MG per tablet Take 1 tablet by mouth every 6 (six) hours as needed. As needed one daily and one nightly as needed       . metoprolol succinate (TOPROL-XL) 25 MG 24 hr tablet Take 25-50 mg by mouth daily. Two tablets in the am and 1 tablet in the pm      . Multiple Vitamin (MULTIVITAMIN WITH MINERALS) TABS tablet Take 1 tablet by mouth daily.      Marland Kitchen omeprazole (PRILOSEC) 20 MG capsule  Take 20 mg by mouth 2 (two) times daily.      Marland Kitchen oxymetazoline (AFRIN) 0.05 % nasal spray Place 2 sprays into the nose at bedtime as needed for congestion.      . traZODone (DESYREL) 50 MG tablet Take 1 tablet (50 mg total) by mouth at bedtime.  30 tablet  0  . pravastatin (PRAVACHOL) 40 MG tablet Take 1 tablet (40 mg total) by mouth every evening.  90 tablet  3   No current facility-administered medications for this visit.   BP 132/72  Pulse 71  Ht 5\' 5"  (1.651 m)  Wt 58.514 kg (129 lb)  BMI 21.47 kg/m2 General: NAD, thin Neck: No JVD, no thyromegaly or thyroid nodule.  Lungs: Clear to auscultation  bilaterally with normal respiratory effort. CV: Nondisplaced PMI.  Heart regular S1/S2 with occasional premature beat, no S3/S4, no murmur.  No peripheral edema.  No carotid bruit.  Normal pedal pulses.  Abdomen: Soft, nontender, no hepatosplenomegaly, no distention.  Neurologic: Alert and oriented x 3.  Psych: Normal affect. Extremities: No clubbing or cyanosis.   Assessment/Plan: 1. Palpitations:  Due to PACS/PVCs.  This seems to have mostly resolved on Toprol XL and diltiazem CD.   2. Hyperlipidemia: I would like to see him on a statin given nonobstructive CAD.  He did not tolerate atorvastatin due to leg weakness and simvastatin interacted with his calcium channel blocker.  I think the leg weakness is related more to his degenerative disc disease than to the statin.  He has normal pedal pulses so doubt significant PAD.   - Start pravastatin 40 mg daly with lipids/LFTs in 3 months.  3. CAD: Nonobstructive CAD on last cath.  No recent chest pain.  Low risk Cardiolite in 7/14.  Continue ASA 81 and restart statin.  4. Smoking: I again encouraged him to stop smoking.  I do not think he is ready.   Loralie Champagne 08/21/2013

## 2013-08-30 ENCOUNTER — Other Ambulatory Visit: Payer: Self-pay

## 2013-08-30 MED ORDER — METOPROLOL SUCCINATE ER 25 MG PO TB24: ORAL_TABLET | ORAL | Status: DC

## 2013-10-04 ENCOUNTER — Ambulatory Visit (INDEPENDENT_AMBULATORY_CARE_PROVIDER_SITE_OTHER): Payer: Medicare PPO | Admitting: Gastroenterology

## 2013-10-04 ENCOUNTER — Encounter: Payer: Self-pay | Admitting: Gastroenterology

## 2013-10-04 VITALS — BP 154/76 | HR 60 | Ht 65.0 in | Wt 131.6 lb

## 2013-10-04 DIAGNOSIS — K5730 Diverticulosis of large intestine without perforation or abscess without bleeding: Secondary | ICD-10-CM

## 2013-10-04 DIAGNOSIS — R109 Unspecified abdominal pain: Secondary | ICD-10-CM

## 2013-10-04 DIAGNOSIS — R198 Other specified symptoms and signs involving the digestive system and abdomen: Secondary | ICD-10-CM

## 2013-10-04 MED ORDER — GLYCOPYRROLATE 1 MG PO TABS: 1.0000 mg | ORAL_TABLET | Freq: Two times a day (BID) | ORAL | Status: DC

## 2013-10-04 NOTE — Progress Notes (Signed)
    History of Present Illness: This is a 73 year old male who relates several episodes of diverticulitis is November 2014. He's been treated by his primary care physician, Dr. Joaquim Lai, in [facility]. He states he had a CT scan performed in [facility] that showed diverticulitis. More recently he complains of intermittent lower abdominal pain associated with more frequent bowel movements. His symptoms occur about 4 days per week and the other days he feels normal. He underwent colonoscopy in January 2013 showing moderate diverticulosis from the sigmoid to the transverse colon as well as internal and external hemorrhoids. Denies weight loss, constipation, change in stool caliber, melena, hematochezia, nausea, vomiting, dysphagia, reflux symptoms, chest pain.  Current Medications, Allergies, Past Medical History, Past Surgical History, Family History and Social History were reviewed in [employer] record.  Physical Exam: General: Well developed , well nourished, no acute distress Head: Normocephalic and atraumatic Eyes:  sclerae anicteric, EOMI Ears: Normal auditory acuity Mouth: No deformity or lesions Lungs: Clear throughout to auscultation Heart: Regular rate and rhythm; no murmurs, rubs or bruits Abdomen: Soft, non tender and non distended. No masses, hepatosplenomegaly or hernias noted. Normal Bowel sounds Musculoskeletal: Symmetrical with no gross deformities  Pulses:  Normal pulses noted Extremities: No clubbing, cyanosis, edema or deformities noted Neurological: Alert oriented x 4, grossly nonfocal Psychological:  Alert and cooperative. Normal mood and affect  Assessment and Recommendations:  1. Recurrent lower abdominal pain and change in bowel habits. History of diverticulitis. I suspect the majority of his symptoms are due to irritable bowel syndrome. Request records, including CT scan, from his primary physician. Begin glycopyrrolate 1 mg twice a day. Begin a high  fiber diet. Begin a daily probiotic. Follow up in 6 weeks.

## 2013-10-04 NOTE — Patient Instructions (Addendum)
You have been given a separate informational sheet regarding your tobacco use, the importance of quitting and local resources to help you quit.  We have sent the following medications to your pharmacy for you to pick up at your convenience: Robinul.  Start a probiotic over the counter such as [employer] engineer. This will put the good bacteria back into your colon.  High-Fiber Diet Fiber is found in fruits, vegetables, and grains. A high-fiber diet encourages the addition of more whole grains, legumes, fruits, and vegetables in your diet. The recommended amount of fiber for adult males is 38 g per day. For adult females, it is 25 g per day. Pregnant and lactating women should get 28 g of fiber per day. If you have a digestive or bowel problem, ask your caregiver for advice before adding high-fiber foods to your diet. Eat a variety of high-fiber foods instead of only a select few type of foods.  PURPOSE  To increase stool bulk.  To make bowel movements more regular to prevent constipation.  To lower cholesterol.  To prevent overeating. WHEN IS THIS DIET USED?  It may be used if you have constipation and hemorrhoids.  It may be used if you have uncomplicated diverticulosis (intestine condition) and irritable bowel syndrome.  It may be used if you need help with weight management.  It may be used if you want to add it to your diet as a protective measure against atherosclerosis, diabetes, and cancer. SOURCES OF FIBER  Whole-grain breads and cereals.  Fruits, such as apples, oranges, bananas, berries, prunes, and pears.  Vegetables, such as green peas, carrots, sweet potatoes, beets, broccoli, cabbage, spinach, and artichokes.  Legumes, such split peas, soy, lentils.  Almonds. FIBER CONTENT IN FOODS Starches and Grains / Dietary Fiber (g)  Cheerios, 1 cup / 3 g  Corn Flakes cereal, 1 cup / 0.7 g  Rice crispy treat cereal, 1 cup / 0.3 g  Instant oatmeal (cooked),  cup / 2 g  Frosted  wheat cereal, 1 cup / 5.1 g  Brown, long-grain rice (cooked), 1 cup / 3.5 g  White, long-grain rice (cooked), 1 cup / 0.6 g  Enriched macaroni (cooked), 1 cup / 2.5 g Legumes / Dietary Fiber (g)  Baked beans (canned, plain, or vegetarian),  cup / 5.2 g  Kidney beans (canned),  cup / 6.8 g  Pinto beans (cooked),  cup / 5.5 g Breads and Crackers / Dietary Fiber (g)  Plain or honey graham crackers, 2 squares / 0.7 g  Saltine crackers, 3 squares / 0.3 g  Plain, salted pretzels, 10 pieces / 1.8 g  Whole-wheat bread, 1 slice / 1.9 g  White bread, 1 slice / 0.7 g  Raisin bread, 1 slice / 1.2 g  Plain bagel, 3 oz / 2 g  Flour tortilla, 1 oz / 0.9 g  Corn tortilla, 1 small / 1.5 g  Hamburger or hotdog bun, 1 small / 0.9 g Fruits / Dietary Fiber (g)  Apple with skin, 1 medium / 4.4 g  Sweetened applesauce,  cup / 1.5 g  Banana,  medium / 1.5 g  Grapes, 10 grapes / 0.4 g  Orange, 1 small / 2.3 g  Raisin, 1.5 oz / 1.6 g  Melon, 1 cup / 1.4 g Vegetables / Dietary Fiber (g)  Green beans (canned),  cup / 1.3 g  Carrots (cooked),  cup / 2.3 g  Broccoli (cooked),  cup / 2.8 g  Peas (cooked),  cup /  4.4 g  Mashed potatoes,  cup / 1.6 g  Lettuce, 1 cup / 0.5 g  Corn (canned),  cup / 1.6 g  Tomato,  cup / 1.1 g Document Released: 05/25/2005 Document Revised: 11/24/2011 Document Reviewed: 08/27/2011 [facility] Patient Information 2014 [place], [place].  cc: Rodena Medin, MD

## 2013-10-09 ENCOUNTER — Telehealth: Payer: Self-pay | Admitting: Gastroenterology

## 2013-10-09 NOTE — Telephone Encounter (Signed)
Patient reports pedal edema since starting on Robinul.  Not listed as a usual side effect, but I instructed the patient to hold it for now.  Dr. Fuller Plan please review and advise

## 2013-10-09 NOTE — Telephone Encounter (Signed)
Not typical for Robinul. Agree with holding Robinul for a few days. If edema does not resolve in a few days he should see his PCP and he can resume Robinul. If it does resolve off Robinul then would try Bentyl 10 mg tid.

## 2013-10-10 NOTE — Telephone Encounter (Signed)
Left message for patient to call back  

## 2013-10-11 NOTE — Telephone Encounter (Signed)
Left message for patient to call back  

## 2013-10-11 NOTE — Telephone Encounter (Signed)
Patient notified of Dr. Lynne Leader recommendations.  He will call back if the edema improves for a rx for bentyl if it does not he will go see his primary care

## 2013-10-18 ENCOUNTER — Telehealth: Payer: Self-pay | Admitting: Gastroenterology

## 2013-10-18 MED ORDER — DICYCLOMINE HCL 10 MG PO CAPS: 10.0000 mg | ORAL_CAPSULE | Freq: Three times a day (TID) | ORAL | Status: DC

## 2013-10-18 NOTE — Telephone Encounter (Signed)
See phone note from 10/09/13.  Per Dr. Fuller Plan will try bentyl if stopping Robinul resolved the pedal edema.  Patient aware new rx for Bentyl sent in.

## 2013-10-24 ENCOUNTER — Encounter: Payer: Self-pay | Admitting: Gastroenterology

## 2013-11-16 ENCOUNTER — Encounter: Payer: Self-pay | Admitting: Gastroenterology

## 2013-11-16 ENCOUNTER — Telehealth: Payer: Self-pay | Admitting: Gastroenterology

## 2013-11-16 ENCOUNTER — Ambulatory Visit (INDEPENDENT_AMBULATORY_CARE_PROVIDER_SITE_OTHER): Payer: Medicare HMO | Admitting: Gastroenterology

## 2013-11-16 VITALS — BP 130/70 | HR 72 | Ht 64.5 in | Wt 132.0 lb

## 2013-11-16 DIAGNOSIS — K589 Irritable bowel syndrome without diarrhea: Secondary | ICD-10-CM

## 2013-11-16 DIAGNOSIS — K5730 Diverticulosis of large intestine without perforation or abscess without bleeding: Secondary | ICD-10-CM

## 2013-11-16 MED ORDER — DICYCLOMINE HCL 10 MG PO CAPS: 10.0000 mg | ORAL_CAPSULE | Freq: Three times a day (TID) | ORAL | Status: DC

## 2013-11-16 NOTE — Telephone Encounter (Signed)
Prescription sent to [facility] instead.

## 2013-11-16 NOTE — Patient Instructions (Addendum)
You have been given a separate informational sheet regarding your tobacco use, the importance of quitting and local resources to help you quit.  Resume your probiotic Align daily.   We have sent the following medications to your pharmacy for you to pick up at your convenience: Bentyl.  High-Fiber Diet Fiber is found in fruits, vegetables, and grains. A high-fiber diet encourages the addition of more whole grains, legumes, fruits, and vegetables in your diet. The recommended amount of fiber for adult males is 38 g per day. For adult females, it is 25 g per day. Pregnant and lactating women should get 28 g of fiber per day. If you have a digestive or bowel problem, ask your caregiver for advice before adding high-fiber foods to your diet. Eat a variety of high-fiber foods instead of only a select few type of foods.  PURPOSE  To increase stool bulk.  To make bowel movements more regular to prevent constipation.  To lower cholesterol.  To prevent overeating. WHEN IS THIS DIET USED?  It may be used if you have constipation and hemorrhoids.  It may be used if you have uncomplicated diverticulosis (intestine condition) and irritable bowel syndrome.  It may be used if you need help with weight management.  It may be used if you want to add it to your diet as a protective measure against atherosclerosis, diabetes, and cancer. SOURCES OF FIBER  Whole-grain breads and cereals.  Fruits, such as apples, oranges, bananas, berries, prunes, and pears.  Vegetables, such as green peas, carrots, sweet potatoes, beets, broccoli, cabbage, spinach, and artichokes.  Legumes, such split peas, soy, lentils.  Almonds. FIBER CONTENT IN FOODS Starches and Grains / Dietary Fiber (g)  Cheerios, 1 cup / 3 g  Corn Flakes cereal, 1 cup / 0.7 g  Rice crispy treat cereal, 1 cup / 0.3 g  Instant oatmeal (cooked),  cup / 2 g  Frosted wheat cereal, 1 cup / 5.1 g  Brown, long-grain rice (cooked), 1 cup  / 3.5 g  White, long-grain rice (cooked), 1 cup / 0.6 g  Enriched macaroni (cooked), 1 cup / 2.5 g Legumes / Dietary Fiber (g)  Baked beans (canned, plain, or vegetarian),  cup / 5.2 g  Kidney beans (canned),  cup / 6.8 g  Pinto beans (cooked),  cup / 5.5 g Breads and Crackers / Dietary Fiber (g)  Plain or honey graham crackers, 2 squares / 0.7 g  Saltine crackers, 3 squares / 0.3 g  Plain, salted pretzels, 10 pieces / 1.8 g  Whole-wheat bread, 1 slice / 1.9 g  White bread, 1 slice / 0.7 g  Raisin bread, 1 slice / 1.2 g  Plain bagel, 3 oz / 2 g  Flour tortilla, 1 oz / 0.9 g  Corn tortilla, 1 small / 1.5 g  Hamburger or hotdog bun, 1 small / 0.9 g Fruits / Dietary Fiber (g)  Apple with skin, 1 medium / 4.4 g  Sweetened applesauce,  cup / 1.5 g  Banana,  medium / 1.5 g  Grapes, 10 grapes / 0.4 g  Orange, 1 small / 2.3 g  Raisin, 1.5 oz / 1.6 g  Melon, 1 cup / 1.4 g Vegetables / Dietary Fiber (g)  Green beans (canned),  cup / 1.3 g  Carrots (cooked),  cup / 2.3 g  Broccoli (cooked),  cup / 2.8 g  Peas (cooked),  cup / 4.4 g  Mashed potatoes,  cup / 1.6 g  Lettuce, 1  cup / 0.5 g  Corn (canned),  cup / 1.6 g  Tomato,  cup / 1.1 g Document Released: 05/25/2005 Document Revised: 11/24/2011 Document Reviewed: 08/27/2011 [facility] Patient Information 2014 [place].

## 2013-11-16 NOTE — Progress Notes (Signed)
    History of Present Illness: This is a 73 year old male returning for followup of IBS. He states a daily probiotic improved his variable bowel habit symptoms but the symptoms persist. Dicyclomine has been very effective for alleviating abdominal cramping.  Current Medications, Allergies, Past Medical History, Past Surgical History, Family History and Social History were reviewed in [employer] record.  Physical Exam: General: Well developed , well nourished, no acute distress Head: Normocephalic and atraumatic Eyes:  sclerae anicteric, EOMI Ears: Normal auditory acuity Mouth: No deformity or lesions Lungs: Clear throughout to auscultation Heart: Regular rate and rhythm; no murmurs, rubs or bruits Abdomen: Soft, non tender and non distended. No masses, hepatosplenomegaly or hernias noted. Normal Bowel sounds Musculoskeletal: Symmetrical with no gross deformities  Pulses:  Normal pulses noted Extremities: No clubbing, cyanosis, edema or deformities noted Neurological: Alert oriented x 4, grossly nonfocal Psychological:  Alert and cooperative. Normal mood and affect  Assessment and Recommendations:  1. IBS. Diverticulosis. Continue a daily probiotic. Increase dietary fiber and water intake. Continue dicyclomine 10 mg 3 times a day as needed. Followup in one year.

## 2013-11-21 ENCOUNTER — Other Ambulatory Visit (INDEPENDENT_AMBULATORY_CARE_PROVIDER_SITE_OTHER): Payer: Commercial Managed Care - HMO

## 2013-11-21 DIAGNOSIS — E785 Hyperlipidemia, unspecified: Secondary | ICD-10-CM

## 2013-11-21 DIAGNOSIS — I2510 Atherosclerotic heart disease of native coronary artery without angina pectoris: Secondary | ICD-10-CM

## 2013-11-21 LAB — LIPID PANEL
CHOLESTEROL: 127 mg/dL (ref 0–200)
HDL: 40.7 mg/dL (ref >39.00)
LDL CALC: 68 mg/dL (ref 0–99)
NonHDL: 86.3
TRIGLYCERIDES: 94 mg/dL (ref 0.0–149.0)
Total CHOL/HDL Ratio: 3
VLDL: 18.8 mg/dL (ref 0.0–40.0)

## 2013-11-21 LAB — HEPATIC FUNCTION PANEL
ALBUMIN: 3.9 g/dL (ref 3.5–5.2)
ALT: 20 U/L (ref 0–53)
AST: 19 U/L (ref 0–37)
Alkaline Phosphatase: 89 U/L (ref 39–117)
Bilirubin, Direct: 0 mg/dL (ref 0.0–0.3)
Total Bilirubin: 0.6 mg/dL (ref 0.2–1.2)
Total Protein: 7 g/dL (ref 6.0–8.3)

## 2013-11-21 LAB — BASIC METABOLIC PANEL
BUN: 20 mg/dL (ref 6–23)
CO2: 25 meq/L (ref 19–32)
Calcium: 9.3 mg/dL (ref 8.4–10.5)
Chloride: 107 mEq/L (ref 96–112)
Creatinine, Ser: 0.8 mg/dL (ref 0.4–1.5)
GFR: 104.95 mL/min (ref >60.00)
GLUCOSE: 88 mg/dL (ref 70–99)
POTASSIUM: 3.6 meq/L (ref 3.5–5.1)
SODIUM: 138 meq/L (ref 135–145)

## 2013-12-28 ENCOUNTER — Other Ambulatory Visit: Payer: Self-pay | Admitting: Cardiology

## 2014-01-05 ENCOUNTER — Other Ambulatory Visit: Payer: Self-pay | Admitting: Cardiology

## 2014-01-05 NOTE — Telephone Encounter (Signed)
Would suggest that he talk with his PCP if he feels he needs to continue this.

## 2014-01-08 ENCOUNTER — Telehealth: Payer: Self-pay

## 2014-01-08 NOTE — Telephone Encounter (Signed)
Should get from PCP if he needs to continue.

## 2014-01-08 NOTE — Telephone Encounter (Signed)
You can refill

## 2014-01-09 ENCOUNTER — Other Ambulatory Visit: Payer: Self-pay

## 2014-01-09 MED ORDER — TRAZODONE HCL 50 MG PO TABS: 50.0000 mg | ORAL_TABLET | Freq: Every day | ORAL | Status: DC

## 2014-01-16 ENCOUNTER — Telehealth: Payer: Self-pay | Admitting: Gastroenterology

## 2014-01-16 ENCOUNTER — Encounter (HOSPITAL_COMMUNITY): Payer: Self-pay | Admitting: Emergency Medicine

## 2014-01-16 ENCOUNTER — Inpatient Hospital Stay (HOSPITAL_COMMUNITY)
Admission: EM | Admit: 2014-01-16 | Discharge: 2014-01-20 | DRG: 378 | Disposition: A | Discharge status: Home / Self Care | Payer: Medicare HMO | Attending: Internal Medicine | Admitting: Internal Medicine

## 2014-01-16 ENCOUNTER — Emergency Department (HOSPITAL_COMMUNITY): Payer: Medicare HMO

## 2014-01-16 DIAGNOSIS — K219 Gastro-esophageal reflux disease without esophagitis: Secondary | ICD-10-CM | POA: Diagnosis present

## 2014-01-16 DIAGNOSIS — F3289 Other specified depressive episodes: Secondary | ICD-10-CM | POA: Diagnosis present

## 2014-01-16 DIAGNOSIS — K222 Esophageal obstruction: Secondary | ICD-10-CM | POA: Diagnosis present

## 2014-01-16 DIAGNOSIS — Z981 Arthrodesis status: Secondary | ICD-10-CM | POA: Diagnosis not present

## 2014-01-16 DIAGNOSIS — Z8546 Personal history of malignant neoplasm of prostate: Secondary | ICD-10-CM | POA: Diagnosis not present

## 2014-01-16 DIAGNOSIS — Z8711 Personal history of peptic ulcer disease: Secondary | ICD-10-CM

## 2014-01-16 DIAGNOSIS — E785 Hyperlipidemia, unspecified: Secondary | ICD-10-CM

## 2014-01-16 DIAGNOSIS — Z79899 Other long term (current) drug therapy: Secondary | ICD-10-CM | POA: Diagnosis not present

## 2014-01-16 DIAGNOSIS — F329 Major depressive disorder, single episode, unspecified: Secondary | ICD-10-CM | POA: Diagnosis present

## 2014-01-16 DIAGNOSIS — L03019 Cellulitis of unspecified finger: Secondary | ICD-10-CM

## 2014-01-16 DIAGNOSIS — K5731 Diverticulosis of large intestine without perforation or abscess with bleeding: Principal | ICD-10-CM | POA: Diagnosis present

## 2014-01-16 DIAGNOSIS — F17200 Nicotine dependence, unspecified, uncomplicated: Secondary | ICD-10-CM | POA: Diagnosis present

## 2014-01-16 DIAGNOSIS — K589 Irritable bowel syndrome without diarrhea: Secondary | ICD-10-CM | POA: Diagnosis present

## 2014-01-16 DIAGNOSIS — K5791 Diverticulosis of intestine, part unspecified, without perforation or abscess with bleeding: Secondary | ICD-10-CM

## 2014-01-16 DIAGNOSIS — K922 Gastrointestinal hemorrhage, unspecified: Secondary | ICD-10-CM | POA: Diagnosis present

## 2014-01-16 DIAGNOSIS — I2510 Atherosclerotic heart disease of native coronary artery without angina pectoris: Secondary | ICD-10-CM

## 2014-01-16 DIAGNOSIS — I10 Essential (primary) hypertension: Secondary | ICD-10-CM | POA: Diagnosis present

## 2014-01-16 DIAGNOSIS — L02519 Cutaneous abscess of unspecified hand: Secondary | ICD-10-CM | POA: Diagnosis present

## 2014-01-16 DIAGNOSIS — D62 Acute posthemorrhagic anemia: Secondary | ICD-10-CM | POA: Diagnosis present

## 2014-01-16 HISTORY — DX: Irritable bowel syndrome, unspecified: K58.9

## 2014-01-16 LAB — CBC WITH DIFFERENTIAL/PLATELET
Basophils Absolute: 0.1 10*3/uL (ref 0.0–0.1)
Basophils Absolute: 0.1 10*3/uL (ref 0.0–0.1)
Basophils Relative: 1 % (ref 0–1)
Basophils Relative: 1 % (ref 0–1)
EOS ABS: 0.1 10*3/uL (ref 0.0–0.7)
EOS PCT: 1 % (ref 0–5)
Eosinophils Absolute: 0.1 10*3/uL (ref 0.0–0.7)
Eosinophils Relative: 1 % (ref 0–5)
HEMATOCRIT: 44.2 % (ref 39.0–52.0)
HEMATOCRIT: 39 % (ref 39.0–52.0)
HEMOGLOBIN: 14.7 g/dL (ref 13.0–17.0)
HEMOGLOBIN: 13.3 g/dL (ref 13.0–17.0)
LYMPHS ABS: 1.9 10*3/uL (ref 0.7–4.0)
LYMPHS ABS: 2.3 10*3/uL (ref 0.7–4.0)
LYMPHS PCT: 22 % (ref 12–46)
Lymphocytes Relative: 19 % (ref 12–46)
MCH: 31.9 pg (ref 26.0–34.0)
MCH: 31.9 pg (ref 26.0–34.0)
MCHC: 33.3 g/dL (ref 30.0–36.0)
MCHC: 34.1 g/dL (ref 30.0–36.0)
MCV: 95.9 fL (ref 78.0–100.0)
MCV: 93.5 fL (ref 78.0–100.0)
MONO ABS: 0.7 10*3/uL (ref 0.1–1.0)
MONO ABS: 0.7 10*3/uL (ref 0.1–1.0)
MONOS PCT: 7 % (ref 3–12)
Monocytes Relative: 6 % (ref 3–12)
NEUTROS ABS: 7.5 10*3/uL (ref 1.7–7.7)
NEUTROS PCT: 74 % (ref 43–77)
Neutro Abs: 7.4 10*3/uL (ref 1.7–7.7)
Neutrophils Relative %: 70 % (ref 43–77)
Platelets: 264 10*3/uL (ref 150–400)
Platelets: 250 10*3/uL (ref 150–400)
RBC: 4.61 MIL/uL (ref 4.22–5.81)
RBC: 4.17 MIL/uL — AB (ref 4.22–5.81)
RDW: 14.4 % (ref 11.5–15.5)
RDW: 14.3 % (ref 11.5–15.5)
WBC: 10.1 10*3/uL (ref 4.0–10.5)
WBC: 10.6 10*3/uL — ABNORMAL HIGH (ref 4.0–10.5)

## 2014-01-16 LAB — COMPREHENSIVE METABOLIC PANEL
ALK PHOS: 81 U/L (ref 39–117)
ALT: 26 U/L (ref 0–53)
ANION GAP: 12 (ref 5–15)
AST: 20 U/L (ref 0–37)
Albumin: 3.7 g/dL (ref 3.5–5.2)
BUN: 21 mg/dL (ref 6–23)
CO2: 24 mEq/L (ref 19–32)
CREATININE: 0.69 mg/dL (ref 0.50–1.35)
Calcium: 9.6 mg/dL (ref 8.4–10.5)
Chloride: 105 mEq/L (ref 96–112)
GFR calc non Af Amer: >90 mL/min (ref >90)
GLUCOSE: 93 mg/dL (ref 70–99)
Potassium: 4.3 mEq/L (ref 3.7–5.3)
Sodium: 141 mEq/L (ref 137–147)
TOTAL PROTEIN: 7 g/dL (ref 6.0–8.3)
Total Bilirubin: 0.3 mg/dL (ref 0.3–1.2)

## 2014-01-16 LAB — LIPASE, BLOOD: LIPASE: 27 U/L (ref 11–59)

## 2014-01-16 LAB — POC OCCULT BLOOD, ED: FECAL OCCULT BLD: POSITIVE — AB

## 2014-01-16 LAB — PROTIME-INR
INR: 0.97 (ref 0.00–1.49)
Prothrombin Time: 12.9 seconds (ref 11.6–15.2)

## 2014-01-16 LAB — HEMOGLOBIN AND HEMATOCRIT, BLOOD
HCT: 39.7 % (ref 39.0–52.0)
HEMOGLOBIN: 13.4 g/dL (ref 13.0–17.0)

## 2014-01-16 LAB — URINALYSIS, ROUTINE W REFLEX MICROSCOPIC
Bilirubin Urine: NEGATIVE
Glucose, UA: NEGATIVE mg/dL
HGB URINE DIPSTICK: NEGATIVE
Ketones, ur: NEGATIVE mg/dL
Leukocytes, UA: NEGATIVE
Nitrite: NEGATIVE
Protein, ur: NEGATIVE mg/dL
SPECIFIC GRAVITY, URINE: 1.015 (ref 1.005–1.030)
UROBILINOGEN UA: 0.2 mg/dL (ref 0.0–1.0)
pH: 6.5 (ref 5.0–8.0)

## 2014-01-16 LAB — TYPE AND SCREEN
ABO/RH(D): A POS
Antibody Screen: NEGATIVE

## 2014-01-16 LAB — ABO/RH: ABO/RH(D): A POS

## 2014-01-16 LAB — MRSA PCR SCREENING: MRSA BY PCR: NEGATIVE

## 2014-01-16 MED ORDER — SODIUM CHLORIDE 0.9 % IV SOLN
INTRAVENOUS | Status: DC
  Administered 2014-01-16: 14:00:00 via INTRAVENOUS

## 2014-01-16 MED ORDER — IOHEXOL 300 MG/ML  SOLN
25.0000 mL | Freq: Once | INTRAMUSCULAR | Status: AC | PRN
  Administered 2014-01-16: 25 mL via ORAL

## 2014-01-16 MED ORDER — TRAZODONE HCL 50 MG PO TABS
50.0000 mg | ORAL_TABLET | Freq: Every evening | ORAL | Status: DC | PRN
  Administered 2014-01-17: 50 mg via ORAL
  Filled 2014-01-16 (×3): qty 1

## 2014-01-16 MED ORDER — PRAVASTATIN SODIUM 40 MG PO TABS
40.0000 mg | ORAL_TABLET | Freq: Every day | ORAL | Status: DC
  Administered 2014-01-16 – 2014-01-19 (×4): 40 mg via ORAL
  Filled 2014-01-16 (×6): qty 1

## 2014-01-16 MED ORDER — SODIUM CHLORIDE 0.9 % IV SOLN
INTRAVENOUS | Status: DC
  Administered 2014-01-17 – 2014-01-19 (×4): via INTRAVENOUS

## 2014-01-16 MED ORDER — SIMVASTATIN 20 MG PO TABS: 20.0000 mg | ORAL_TABLET | Freq: Every day | ORAL | Status: DC

## 2014-01-16 MED ORDER — ONDANSETRON HCL 4 MG/2ML IJ SOLN
4.0000 mg | Freq: Once | INTRAMUSCULAR | Status: AC
  Administered 2014-01-16: 4 mg via INTRAVENOUS
  Filled 2014-01-16: qty 2

## 2014-01-16 MED ORDER — DILTIAZEM HCL ER BEADS 240 MG PO CP24: 360.0000 mg | ORAL_CAPSULE | Freq: Every day | ORAL | Status: DC

## 2014-01-16 MED ORDER — SODIUM CHLORIDE 0.9 % IJ SOLN
3.0000 mL | Freq: Two times a day (BID) | INTRAMUSCULAR | Status: DC
  Administered 2014-01-16 – 2014-01-19 (×5): 3 mL via INTRAVENOUS

## 2014-01-16 MED ORDER — PANTOPRAZOLE SODIUM 40 MG IV SOLR
40.0000 mg | Freq: Two times a day (BID) | INTRAVENOUS | Status: DC
  Administered 2014-01-16 – 2014-01-17 (×2): 40 mg via INTRAVENOUS
  Filled 2014-01-16 (×3): qty 40

## 2014-01-16 MED ORDER — METOPROLOL SUCCINATE ER 25 MG PO TB24
25.0000 mg | ORAL_TABLET | Freq: Every day | ORAL | Status: DC
  Administered 2014-01-17: 25 mg via ORAL
  Filled 2014-01-16: qty 1

## 2014-01-16 MED ORDER — DILTIAZEM HCL ER COATED BEADS 360 MG PO CP24
360.0000 mg | ORAL_CAPSULE | Freq: Every day | ORAL | Status: DC
  Administered 2014-01-17: 360 mg via ORAL
  Filled 2014-01-16: qty 1

## 2014-01-16 MED ORDER — IOHEXOL 300 MG/ML  SOLN
100.0000 mL | Freq: Once | INTRAMUSCULAR | Status: AC | PRN
  Administered 2014-01-16: 100 mL via INTRAVENOUS

## 2014-01-16 NOTE — ED Provider Notes (Signed)
CSN: [id number]     Arrival date & time 01/16/14  1219 History   First MD Initiated Contact with Patient 01/16/14 1234     Chief Complaint  Patient presents with  . Rectal Bleeding     (Consider location/radiation/quality/duration/timing/severity/associated sxs/prior Treatment) HPI Comments: [name] is a 73 y.o. Male with a PMHx of HLD, HTN, CAD, GERD, PUD, prostate CA s/p prostatectomy (2009), diverticulosis, IBS, and hemorrhoids, presenting today with 3 episodes of painless bright red blood per rectum that occurred with defecation this AM. States he had a BM this AM, which was normal in consistency and appearance, but at that time he noticed a moderate amount of bright red blood in the toilet. He states that 20 minutes later he reports he had moderately painful nonradiating crampy lower abd pain and a feeling like "needing to have diarrhea" at which time he went to have a BM and blood came out without stool. Denies straining or constipation. States he's never had this before, and this does not feel like his previous diverticulitis pain. Takes Excedrin daily, but no other blood thinning medications. Endorses some mild nausea that began after arrival, no vomiting. States he took vicodin early this morning but it did not change his abd pain because at the time he took it, he wasn't having abd pain. States the abd pain has gone away now. Denies dysuria, hematuria, fevers, chills, CP, SOB, diarrhea, constipation, melena, dizziness, syncope, lightheadedness, orthostatic symptoms, paresthesias, weakness. Still passing flatus. Denies rectal pain or trauma. No recent travel or suspicious food intake. Has not tried anything for his symptoms prior to coming to the ED.  Patient is a 73 y.o. male presenting with hematochezia. The history is provided by the patient. No language interpreter was used.  Rectal Bleeding Quality:  Bright red Amount:  Moderate Duration:  4 hours Timing:   Intermittent Progression:  Unchanged Chronicity:  New Context: defecation   Context: not anal fissures, not anal penetration, not constipation, not diarrhea, not hemorrhoids, not rectal injury and not rectal pain   Similar symptoms previously: no prior episodes.   Relieved by:  None tried Worsened by:  Defecation Ineffective treatments:  None tried Associated symptoms: abdominal pain (L lower abd, intermittent)   Associated symptoms: no dizziness, no epistaxis, no fever, no hematemesis, no light-headedness, no loss of consciousness, no recent illness and no vomiting   Risk factors: NSAID use (excedrin migraine use daily)   Risk factors: no anticoagulant use, no hx of colorectal cancer, no hx of IBD, no liver disease and no steroid use     Past Medical History  Diagnosis Date  . Hyperlipidemia   . Hypertension   . GERD (gastroesophageal reflux disease)   . PUD (peptic ulcer disease)   . Esophageal stricture   . Prostate cancer 11/2007  . ED (erectile dysfunction)   . Benign neoplasm of adrenal gland   . Peyronie's disease   . Depression   . Diverticulosis   . Coronary artery disease   . Palpitations   . Hemorrhoids   . Ischemia   . Arthritis   . Cataract     "beginnings" per pt.  . History of kidney stones   . IBS (irritable bowel syndrome)    Past Surgical History  Procedure Laterality Date  . Spinal fusion  99,06  . Hernia repair    . Prostatectomy  12/2007  . Tonsillectomy and adenoidectomy    . Nasal septum surgery     Family History  Problem Relation Age of Onset  . Colon cancer Maternal Aunt 74  . Heart attack Father 53  . Aneurysm Father   . Lymphoma Maternal Aunt   . Bone cancer Maternal Grandfather    History  Substance Use Topics  . Smoking status: Current Every Day Smoker -- 0.75 packs/day for 53 years    Types: Cigarettes  . Smokeless tobacco: Never Used  . Alcohol Use: No    Review of Systems  Constitutional: Negative for fever.  HENT:  Negative for nosebleeds.   Respiratory: Negative for chest tightness and shortness of breath.   Cardiovascular: Negative for chest pain.  Gastrointestinal: Positive for nausea, abdominal pain (L lower abd, intermittent), blood in stool and hematochezia. Negative for vomiting, diarrhea, constipation, abdominal distention, rectal pain and hematemesis.  Genitourinary: Negative for dysuria, urgency, hematuria, decreased urine volume and testicular pain.  Musculoskeletal: Negative for arthralgias, back pain and myalgias.  Skin: Negative for color change.  Neurological: Negative for dizziness, loss of consciousness, syncope, weakness, light-headedness and headaches.  Hematological: Does not bruise/bleed easily.  Psychiatric/Behavioral: Negative for confusion.  10 Systems reviewed and are negative for acute change except as noted in the HPI.     Allergies  Etodolac; Sulfonamide derivatives; and Pseudoephedrine  Home Medications   Prior to Admission medications   Medication Sig Start Date End Date Taking? Authorizing Provider  aspirin-acetaminophen-caffeine (EXCEDRIN MIGRAINE) [phone] MG per tablet Take 1 tablet by mouth every 6 (six) hours as needed for pain.   Yes Historical Provider, MD  dicyclomine (BENTYL) 10 MG capsule Take 1 capsule (10 mg total) by mouth 3 (three) times daily. 11/16/13  Yes Ladene Artist, MD  diltiazem [facility]) 360 MG 24 hr capsule Take 360 mg by mouth daily.   Yes Historical Provider, MD  HYDROcodone-acetaminophen (NORCO/VICODIN) 5-325 MG per tablet Take 1 tablet by mouth every 6 (six) hours as needed for moderate pain.   Yes Historical Provider, MD  metoprolol succinate (TOPROL-XL) 25 MG 24 hr tablet Two tablets in the am and 1 tablet in the pm 08/30/13 08/31/14 Yes Larey Dresser, MD  Multiple Vitamin (MULTIVITAMIN WITH MINERALS) TABS tablet Take 1 tablet by mouth daily.   Yes Historical Provider, MD  omeprazole (PRILOSEC) 20 MG capsule Take 20 mg by mouth 2 (two)  times daily.   Yes Historical Provider, MD  oxymetazoline (AFRIN) 0.05 % nasal spray Place 2 sprays into the nose at bedtime as needed for congestion.   Yes Historical Provider, MD  pravastatin (PRAVACHOL) 40 MG tablet Take 1 tablet (40 mg total) by mouth every evening. 08/21/13  Yes Larey Dresser, MD  traZODone (DESYREL) 50 MG tablet Take 50 mg by mouth at bedtime as needed for sleep.   Yes Historical Provider, MD   BP 144/59  Pulse 56  Temp(Src) 98.1 F (36.7 C) (Oral)  Resp 18  SpO2 95% Physical Exam  Nursing note and vitals reviewed. Constitutional: He is oriented to person, place, and time. Vital signs are normal. He appears well-developed and well-nourished. No distress.  Afebrile nontoxic NAD  HENT:  Head: Normocephalic and atraumatic.  Mouth/Throat: Mucous membranes are dry.  Dry mucous membranes  Eyes: Conjunctivae and EOM are normal. Pupils are equal, round, and reactive to light. Right eye exhibits no discharge. Left eye exhibits no discharge.  Neck: Normal range of motion. Neck supple.  Cardiovascular: Normal rate, regular rhythm, normal heart sounds and intact distal pulses.  Exam reveals no gallop and no friction rub.   No  murmur heard. Pulmonary/Chest: Effort normal and breath sounds normal. No respiratory distress. He has no decreased breath sounds. He has no wheezes. He has no rhonchi. He has no rales.  Abdominal: Soft. Normal appearance and bowel sounds are normal. He exhibits no distension. There is tenderness in the left lower quadrant. There is no rigidity, no rebound, no guarding, no CVA tenderness, no tenderness at McBurney's point and negative Murphy's sign.    Soft, nondistended, with +BS throughout, TTP in LLQ without r/g/r, neg murphy's, neg mcburney's, no CVA TTP   Genitourinary: Rectal exam shows external hemorrhoid and internal hemorrhoid. Rectal exam shows no fissure, no mass, no tenderness and anal tone normal. Guaiac positive stool.  FOBT+. Red blood  visible on glove, no maroon or black stool. Internal hemorrhoids palpated, and external hemorrhoids visible at anus. Good anal tone. No anal fissures. No masses or tenderness in rectum.  Musculoskeletal: Normal range of motion.  Neurological: He is alert and oriented to person, place, and time.  Skin: Skin is warm, dry and intact. No rash noted.  Psychiatric: He has a normal mood and affect.    ED Course  Procedures (including critical care time) Labs Review Labs Reviewed  POC OCCULT BLOOD, ED - Abnormal; Notable for the following:    Fecal Occult Bld POSITIVE (*)    All other components within normal limits  CBC WITH DIFFERENTIAL  COMPREHENSIVE METABOLIC PANEL  LIPASE, BLOOD  PROTIME-INR  URINALYSIS, ROUTINE W REFLEX MICROSCOPIC  OCCULT BLOOD X 1 CARD TO LAB, STOOL  HEMOGLOBIN AND HEMATOCRIT, BLOOD    Imaging Review Ct Abdomen Pelvis W Contrast  01/16/2014   CLINICAL DATA:  Abdominal pain and diarrhea  EXAM: CT ABDOMEN AND PELVIS WITH CONTRAST  TECHNIQUE: Multidetector CT imaging of the abdomen and pelvis was performed using the standard protocol following bolus administration of intravenous contrast. Oral contrast was also administered.  CONTRAST:  153mL OMNIPAQUE IOHEXOL 300 MG/ML  SOLN  COMPARISON:  August 11, 2013 and August 10, 2009  FINDINGS: Lung bases are clear. There are multiple foci of coronary artery calcification.  Liver is prominent, measuring 16.4 cm in length. There is fatty change in the liver. No focal liver lesions are identified. The gallbladder wall is not appreciably thickened. There is no biliary duct dilatation.  Spleen, pancreas, and right adrenal appear normal. There is a stable left adrenal mass measuring 2.9 x 2.7 cm.  There is again noted a cyst in the mid left kidney measuring There are stable masses in the right kidney posteriorly which cannot be classified as simple cysts. One of these masses measures 0.9 x 0.9 cm. The other mass measures 1.2 x 1.0 cm. These  masses have remained stable since 2011. There is no hydronephrosis on either side. There is no renal or ureteral calculus on either side.  In the pelvis, there is stable mild thickening of the posterior wall of the urinary bladder. Urinary bladder otherwise appears normal. Rectum is distended with air. There is extensive diverticulosis throughout the sigmoid colon with extensive muscular hypertrophy due to diverticulosis throughout the sigmoid colon, stable in appearance since 2011. A lesser degree of wall thickening is noted in the at ascending colon, likewise stable and felt to be due to diverticulosis. There is no surrounding mesenteric thickening or colonic/pericolonic abscess. There is no pelvic mass or fluid collection. The appendix appears normal. Prostate is absent.  There is no bowel obstruction. No free air or portal venous air. There is no ascites, adenopathy, or abscess  in the abdomen or pelvis. There is extensive atherosclerotic change throughout the aorta and iliac arteries. There is also extensive renal artery calcification bilaterally. There is no abdominal aortic aneurysm. There is postoperative change in the lower lumbar spine. There are no blastic or lytic bone lesions apparent.  IMPRESSION: Extensive muscular hypertrophy from chronic diverticulosis, most marked throughout the sigmoid colon. There is no frank diverticulitis. There is no bowel obstruction. No abscess. Appendix appears normal.  Prostate absent.  Liver prominent with fatty change.  Stable left adrenal mass. Stable small masses in the right kidney since 2011 consistent with benign etiology. Large cyst in the left kidney, stable. Smaller cysts are also noted in the left kidney. No hydronephrosis on either side. No renal or ureteral calculi.  Mild thickening of the posterior wall of the urinary bladder is chronic and stable.   Electronically Signed   By: Lowella Grip M.D.   On: 01/16/2014 17:03     EKG Interpretation None       MDM   Final diagnoses:  None     73y/o male with IBS and diverticulosis with BRBPR this AM, x3 episodes. Will obtain labs, U/A, and FOBT. Will give gentle IVF hydration, as well as zofran now for nausea, and reassess after labs. Pt denies wanting pain medications now. Will hold on imaging at this time, but might consider abd/pelvis CT if rectal exam is benign, given hx of IBS/diverticulitis.  3:15 PM Rectal showing hemorrhoids but red blood on digital exam appears slightly darker than those coming from hemorrhoids. Will obtain CT to r/o diverticular bleed. CBC w/diff WNL, H/H stable. CMP WNL. Lipase WNL. INR WNL. Nausea resolved, tolerating PO intake. Awaiting U/A. Will reassess after CT. VS remain stable.   4:42 PM FOBT +, U/A negative for infection or hematuria. CT still pending, will sign out care to [facility] PA-C/Dr. Wilson Singer for continued management. Pt likely to be admitted for GI bleed. Please see Dr. Wilson Singer and Monico Blitz, PA-C's dictation for further documentation of patient care.   BP 144/59  Pulse 56  Temp(Src) 98.1 F (36.7 C) (Oral)  Resp 18  SpO2 95%  Meds ordered this encounter  Medications  . ondansetron (ZOFRAN) injection 4 mg    Sig:   . 0.9 %  sodium chloride infusion    Sig:   . iohexol (OMNIPAQUE) 300 MG/ML solution 25 mL    Sig:   . iohexol (OMNIPAQUE) 300 MG/ML solution 100 mL    Sig:      Patty Sermons Camprubi-Soms, PA-C 01/16/14 1727

## 2014-01-16 NOTE — ED Provider Notes (Signed)
PROGRESS NOTE                                                                                                                 This is a sign-out from PA Camprubi-Soms at shift change: [name] is a 73 y.o. male presenting with 3 episodes of bright red blood rectal onset this morning. Patient had fleeting lower, pain which is now resolved. Past medical history significant for hemorrhoids and diverticulitis. Patient states this is not consistent with prior episodes of diverticulitis. Plan is to followup CAT scan in May 2 hospital for serial H&H. Please refer to previous note for full HPI, ROS, PMH and PE.   CAT scan shows muscular hypertrophy consistent with chronic diverticulosis but no acute diverticulitis.  Patient has had a another episode of what he describes as large volume of bright red blood per rectum when he has a bowel movement. Repeat abdominal exam is benign.  Patient will be admitted to a telemetry bed under the care of Dr. Ernestina Patches.    Monico Blitz, PA-C 01/16/14 1909

## 2014-01-16 NOTE — Telephone Encounter (Signed)
Patient reports that he had a BM approximately an hour ago.  He reports that he "thought I was going to have diarrhea".  Patient reports that he passed a large amount of dark blood. "filled the toilet".  Blood was independent of stool.  He reports another BM approximately 20 minutes ago that was also mostly blood.  He denies any SOB, CP, dizziness or other complaints.  Also denies pain.  He is instructed to go the ER immediately.  That he should not drive.  He verbalized understanding.

## 2014-01-16 NOTE — ED Notes (Signed)
To ED via POV for eval of bright red blood from rectum on 3 occasions this am. States he has intermittent lower abd pain. No vomiting. Pt takes no bleed thinners. Skin w/d, resp e/u.

## 2014-01-16 NOTE — ED Notes (Signed)
Patient transported to CT 

## 2014-01-16 NOTE — H&P (Addendum)
Hospitalist Admission History and Physical  Patient name: [name] Medical record number: [id number] Date of birth: 04-Jun-1941 Age: 73 y.o. Gender: male  Primary Care Provider: Rodena Medin, MD  Chief Complaint: GIB History of Present Illness:This is a 73 y.o. year old male with significant past medical history of diverticulosis, HTN, CAD, GERD, PUD presenting with melena. Pt states that he has had at least 3-4 frank bloody bowel movements over the past 24 hours. Marooned/dark color. Denies any prior hx/o this in the past. Has hx/o diverticulitis in the past. Has not an appreciable amount of LLQ/abd pain with sxs today. Denies any vomiting. States that he takes excedrin migraine and ASA daily. No increased dosing recently. Denies any recent strenuous activity. Gastroenterologist Dr. Fuller Plan. States that he had a colonoscopy within the last 5 years that showed diverticulosis, internal/external hemorrhoids.  Presented to the ER afebrile, hemodynamically stable. HR 50s-70s. BP 130s-160s. Satting 91-98% on RA. WBC 10.1, Hgb 14.7, Cr 0.69, BUN 21. Hemoccult positive. CT abd shows chronic diverticulosis without diverticulitis. No SBO. Stable masses in R and L kidney.   Assessment and Plan: [name] is a 73 y.o. year old male presenting with GIB   Active Problems:   Lower GI bleed   GIB (gastrointestinal bleeding)   1-GIB -Likely secondary to high NSAID intake in setting of diverticular and hemorrhoidal disease -serial CBCs -high dose PPI  -type and screen  -stepdown bed  -GI consult  -mild LLQ TTP on exam. IV cipro and flagyl overnight   2-CAD/HTN -no active CP  -hold ASA -otherwise continue regimen -tele monitoring   FEN/GI: NPO. High dose PPI  Prophylaxis: SCDs  Disposition: pending further evaluation  Code Status:Full Code    Patient Active Problem List   Diagnosis Date Noted  . Lower GI bleed 01/16/2014  . GIB (gastrointestinal bleeding) 01/16/2014  . Diverticulitis  02/01/2012  . Weight loss 02/01/2012  . CAD (coronary artery disease) 12/10/2011  . General medical examination 07/17/2011  . CIGARETTE SMOKER 02/13/2009  . PALPITATIONS, CHRONIC 02/04/2009  . LOW BACK PAIN SYNDROME 10/10/2008  . DIVERTICULOSIS-COLON 04/10/2008  . ADENOCARCINOMA, PROSTATE 11/28/2007  . DEPRESSION 08/22/2007  . FATIGUE, CHRONIC 01/19/2007  . HYPERLIPIDEMIA 07/13/2006  . ERECTILE DYSFUNCTION 07/13/2006  . HYPERTENSION 07/13/2006  . GERD 07/13/2006  . PEPTIC ULCER DISEASE 07/13/2006  . 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 Hyperlipidemia   . Hypertension   . GERD (gastroesophageal reflux disease)   . PUD (peptic ulcer disease)   . Esophageal stricture   . Prostate cancer 11/2007  . ED (erectile dysfunction)   . Benign neoplasm of adrenal gland   . Peyronie's disease   . Depression   . Diverticulosis   . Coronary artery disease   . Palpitations   . Hemorrhoids   . Ischemia   . Arthritis   . Cataract     "beginnings" per pt.  . History of kidney stones   . IBS (irritable bowel syndrome)     Past Surgical History: Past Surgical History  Procedure Laterality Date  . Spinal fusion  99,06  . Hernia repair    . Prostatectomy  12/2007  . Tonsillectomy and adenoidectomy    . Nasal septum surgery      Social History: History   Social History  . Marital Status: Married    Spouse Name: N/A    Number of Children: 2  . Years of Education: N/A  Occupational History  . Retired    Social History Main Topics  . Smoking status: Current Every Day Smoker -- 0.75 packs/day for 53 years    Types: Cigarettes  . Smokeless tobacco: Never Used  . Alcohol Use: No  . Drug Use: No  . Sexual Activity: Yes   Other Topics Concern  . None   Social History Narrative  . None    Family History: Family History  Problem Relation Age of Onset  . Colon cancer Maternal Aunt 67  . Heart attack Father 64  .  Aneurysm Father   . Lymphoma Maternal Aunt   . Bone cancer Maternal Grandfather     Allergies: Allergies  Allergen Reactions  . Etodolac     REACTION: diarrhea  . Sulfonamide Derivatives Itching  . Pseudoephedrine Palpitations    Heart race    Current Facility-Administered Medications  Medication Dose Route Frequency Provider Last Rate Last Dose  . 0.9 %  sodium chloride infusion   Intravenous Continuous Mercedes Strupp Camprubi-Soms, PA-C 100 mL/hr at 01/16/14 1333    . 0.9 %  sodium chloride infusion   Intravenous Continuous Shanda Howells, MD      . diltiazem [facility]) 24 hr capsule 360 mg  360 mg Oral Daily Shanda Howells, MD      . metoprolol succinate (TOPROL-XL) 24 hr tablet 25 mg  25 mg Oral Daily Shanda Howells, MD      . pantoprazole (PROTONIX) injection 40 mg  40 mg Intravenous Q12H Shanda Howells, MD      . Derrill Memo ON 01/17/2014] simvastatin (ZOCOR) tablet 20 mg  20 mg Oral q1800 Shanda Howells, MD      . sodium chloride 0.9 % injection 3 mL  3 mL Intravenous Q12H Shanda Howells, MD      . traZODone (DESYREL) tablet 50 mg  50 mg Oral QHS PRN Shanda Howells, MD       Current Outpatient Prescriptions  Medication Sig Dispense Refill  . aspirin-acetaminophen-caffeine (EXCEDRIN MIGRAINE) 250-250-65 MG per tablet Take 1 tablet by mouth every 6 (six) hours as needed for pain.      Marland Kitchen dicyclomine (BENTYL) 10 MG capsule Take 1 capsule (10 mg total) by mouth 3 (three) times daily.  270 capsule  3  . diltiazem (TIAZAC) 360 MG 24 hr capsule Take 360 mg by mouth daily.      Marland Kitchen HYDROcodone-acetaminophen (NORCO/VICODIN) 5-325 MG per tablet Take 1 tablet by mouth every 6 (six) hours as needed for moderate pain.      . metoprolol succinate (TOPROL-XL) 25 MG 24 hr tablet Two tablets in the am and 1 tablet in the pm  270 tablet  4  . Multiple Vitamin (MULTIVITAMIN WITH MINERALS) TABS tablet Take 1 tablet by mouth daily.      Marland Kitchen omeprazole (PRILOSEC) 20 MG capsule Take 20 mg by mouth 2 (two) times  daily.      Marland Kitchen oxymetazoline (AFRIN) 0.05 % nasal spray Place 2 sprays into the nose at bedtime as needed for congestion.      . pravastatin (PRAVACHOL) 40 MG tablet Take 1 tablet (40 mg total) by mouth every evening.  90 tablet  3  . traZODone (DESYREL) 50 MG tablet Take 50 mg by mouth at bedtime as needed for sleep.       Review Of Systems: 12 point ROS negative except as noted above in HPI.  Physical Exam: Filed Vitals:   01/16/14 1830  BP: 134/62  Pulse: 54  Temp:  Resp:     General: alert and cooperative HEENT: PERRLA and extra ocular movement intact Heart: S1, S2 normal, no murmur, rub or gallop, regular rate and rhythm Lungs: clear to auscultation, no wheezes or rales and unlabored breathing Abdomen: abdomen is soft without significant tenderness, masses, organomegaly or guarding Extremities: extremities normal, atraumatic, no cyanosis or edema Skin:no rashes, no ecchymoses Neurology: normal without focal findings  Labs and Imaging: Lab Results  Component Value Date/Time   NA 141 01/16/2014  1:03 PM   K 4.3 01/16/2014  1:03 PM   CL 105 01/16/2014  1:03 PM   CO2 24 01/16/2014  1:03 PM   BUN 21 01/16/2014  1:03 PM   CREATININE 0.69 01/16/2014  1:03 PM   GLUCOSE 93 01/16/2014  1:03 PM   Lab Results  Component Value Date   WBC 10.1 01/16/2014   HGB 13.4 01/16/2014   HCT 39.7 01/16/2014   MCV 95.9 01/16/2014   PLT 264 01/16/2014    Ct Abdomen Pelvis W Contrast  01/16/2014   CLINICAL DATA:  Abdominal pain and diarrhea  EXAM: CT ABDOMEN AND PELVIS WITH CONTRAST  TECHNIQUE: Multidetector CT imaging of the abdomen and pelvis was performed using the standard protocol following bolus administration of intravenous contrast. Oral contrast was also administered.  CONTRAST:  138mL OMNIPAQUE IOHEXOL 300 MG/ML  SOLN  COMPARISON:  August 11, 2013 and August 10, 2009  FINDINGS: Lung bases are clear. There are multiple foci of coronary artery calcification.  Liver is prominent, measuring 16.4 cm in  length. There is fatty change in the liver. No focal liver lesions are identified. The gallbladder wall is not appreciably thickened. There is no biliary duct dilatation.  Spleen, pancreas, and right adrenal appear normal. There is a stable left adrenal mass measuring 2.9 x 2.7 cm.  There is again noted a cyst in the mid left kidney measuring There are stable masses in the right kidney posteriorly which cannot be classified as simple cysts. One of these masses measures 0.9 x 0.9 cm. The other mass measures 1.2 x 1.0 cm. These masses have remained stable since 2011. There is no hydronephrosis on either side. There is no renal or ureteral calculus on either side.  In the pelvis, there is stable mild thickening of the posterior wall of the urinary bladder. Urinary bladder otherwise appears normal. Rectum is distended with air. There is extensive diverticulosis throughout the sigmoid colon with extensive muscular hypertrophy due to diverticulosis throughout the sigmoid colon, stable in appearance since 2011. A lesser degree of wall thickening is noted in the at ascending colon, likewise stable and felt to be due to diverticulosis. There is no surrounding mesenteric thickening or colonic/pericolonic abscess. There is no pelvic mass or fluid collection. The appendix appears normal. Prostate is absent.  There is no bowel obstruction. No free air or portal venous air. There is no ascites, adenopathy, or abscess in the abdomen or pelvis. There is extensive atherosclerotic change throughout the aorta and iliac arteries. There is also extensive renal artery calcification bilaterally. There is no abdominal aortic aneurysm. There is postoperative change in the lower lumbar spine. There are no blastic or lytic bone lesions apparent.  IMPRESSION: Extensive muscular hypertrophy from chronic diverticulosis, most marked throughout the sigmoid colon. There is no frank diverticulitis. There is no bowel obstruction. No abscess. Appendix  appears normal.  Prostate absent.  Liver prominent with fatty change.  Stable left adrenal mass. Stable small masses in the right kidney since 2011 consistent  with benign etiology. Large cyst in the left kidney, stable. Smaller cysts are also noted in the left kidney. No hydronephrosis on either side. No renal or ureteral calculi.  Mild thickening of the posterior wall of the urinary bladder is chronic and stable.   Electronically Signed   By: Lowella Grip M.D.   On: 01/16/2014 17:03           Shanda Howells MD  Pager: [phone]

## 2014-01-17 LAB — CBC
HEMATOCRIT: 35.3 % — AB (ref 39.0–52.0)
Hemoglobin: 11.8 g/dL — ABNORMAL LOW (ref 13.0–17.0)
MCH: 31.6 pg (ref 26.0–34.0)
MCHC: 33.4 g/dL (ref 30.0–36.0)
MCV: 94.4 fL (ref 78.0–100.0)
Platelets: 254 10*3/uL (ref 150–400)
RBC: 3.74 MIL/uL — AB (ref 4.22–5.81)
RDW: 14.3 % (ref 11.5–15.5)
WBC: 9.9 10*3/uL (ref 4.0–10.5)

## 2014-01-17 LAB — CBC WITH DIFFERENTIAL/PLATELET
BASOS PCT: 0 % (ref 0–1)
Basophils Absolute: 0 10*3/uL (ref 0.0–0.1)
Basophils Absolute: 0 10*3/uL (ref 0.0–0.1)
Basophils Relative: 0 % (ref 0–1)
Eosinophils Absolute: 0.1 10*3/uL (ref 0.0–0.7)
Eosinophils Absolute: 0.1 10*3/uL (ref 0.0–0.7)
Eosinophils Relative: 1 % (ref 0–5)
Eosinophils Relative: 1 % (ref 0–5)
HCT: 37.6 % — ABNORMAL LOW (ref 39.0–52.0)
HEMATOCRIT: 35.6 % — AB (ref 39.0–52.0)
HEMOGLOBIN: 12.6 g/dL — AB (ref 13.0–17.0)
Hemoglobin: 11.8 g/dL — ABNORMAL LOW (ref 13.0–17.0)
LYMPHS ABS: 1.5 10*3/uL (ref 0.7–4.0)
LYMPHS PCT: 16 % (ref 12–46)
Lymphocytes Relative: 19 % (ref 12–46)
Lymphs Abs: 2 10*3/uL (ref 0.7–4.0)
MCH: 31.2 pg (ref 26.0–34.0)
MCH: 32.3 pg (ref 26.0–34.0)
MCHC: 33.1 g/dL (ref 30.0–36.0)
MCHC: 33.5 g/dL (ref 30.0–36.0)
MCV: 94.2 fL (ref 78.0–100.0)
MCV: 96.4 fL (ref 78.0–100.0)
MONO ABS: 0.6 10*3/uL (ref 0.1–1.0)
Monocytes Absolute: 0.5 10*3/uL (ref 0.1–1.0)
Monocytes Relative: 6 % (ref 3–12)
Monocytes Relative: 5 % (ref 3–12)
NEUTROS PCT: 78 % — AB (ref 43–77)
Neutro Abs: 7.7 10*3/uL (ref 1.7–7.7)
Neutro Abs: 6.9 10*3/uL (ref 1.7–7.7)
Neutrophils Relative %: 74 % (ref 43–77)
PLATELETS: 246 10*3/uL (ref 150–400)
Platelets: 236 10*3/uL (ref 150–400)
RBC: 3.78 MIL/uL — ABNORMAL LOW (ref 4.22–5.81)
RBC: 3.9 MIL/uL — AB (ref 4.22–5.81)
RDW: 14.4 % (ref 11.5–15.5)
RDW: 14.3 % (ref 11.5–15.5)
WBC: 10.5 10*3/uL (ref 4.0–10.5)
WBC: 9 10*3/uL (ref 4.0–10.5)

## 2014-01-17 LAB — COMPREHENSIVE METABOLIC PANEL
ALBUMIN: 3.1 g/dL — AB (ref 3.5–5.2)
ALT: 21 U/L (ref 0–53)
ANION GAP: 14 (ref 5–15)
AST: 16 U/L (ref 0–37)
Alkaline Phosphatase: 66 U/L (ref 39–117)
BUN: 17 mg/dL (ref 6–23)
CALCIUM: 8.5 mg/dL (ref 8.4–10.5)
CO2: 22 mEq/L (ref 19–32)
CREATININE: 0.67 mg/dL (ref 0.50–1.35)
Chloride: 105 mEq/L (ref 96–112)
GFR calc Af Amer: >90 mL/min (ref >90)
GFR calc non Af Amer: >90 mL/min (ref >90)
Glucose, Bld: 89 mg/dL (ref 70–99)
Potassium: 4 mEq/L (ref 3.7–5.3)
Sodium: 141 mEq/L (ref 137–147)
TOTAL PROTEIN: 5.7 g/dL — AB (ref 6.0–8.3)
Total Bilirubin: 0.5 mg/dL (ref 0.3–1.2)

## 2014-01-17 MED ORDER — DIPHENHYDRAMINE HCL 50 MG/ML IJ SOLN
25.0000 mg | Freq: Once | INTRAMUSCULAR | Status: AC
  Administered 2014-01-18: 25 mg via INTRAVENOUS
  Filled 2014-01-17: qty 1

## 2014-01-17 MED ORDER — METOCLOPRAMIDE HCL 5 MG/ML IJ SOLN
10.0000 mg | Freq: Once | INTRAMUSCULAR | Status: AC
  Administered 2014-01-18: 10 mg via INTRAVENOUS
  Filled 2014-01-17 (×2): qty 2

## 2014-01-17 MED ORDER — METOPROLOL SUCCINATE ER 50 MG PO TB24
50.0000 mg | ORAL_TABLET | Freq: Every day | ORAL | Status: DC
  Administered 2014-01-18 – 2014-01-20 (×3): 50 mg via ORAL
  Filled 2014-01-17 (×3): qty 1

## 2014-01-17 MED ORDER — DICYCLOMINE HCL 10 MG PO CAPS
10.0000 mg | ORAL_CAPSULE | Freq: Three times a day (TID) | ORAL | Status: DC
  Filled 2014-01-17 (×3): qty 1

## 2014-01-17 MED ORDER — DILTIAZEM HCL ER BEADS 240 MG PO CP24
360.0000 mg | ORAL_CAPSULE | Freq: Every day | ORAL | Status: DC
  Administered 2014-01-18: 360 mg via ORAL
  Filled 2014-01-17 (×5): qty 1

## 2014-01-17 MED ORDER — METOPROLOL SUCCINATE ER 25 MG PO TB24
25.0000 mg | ORAL_TABLET | Freq: Every day | ORAL | Status: DC
  Administered 2014-01-17 – 2014-01-19 (×3): 25 mg via ORAL
  Filled 2014-01-17 (×5): qty 1

## 2014-01-17 MED ORDER — PANTOPRAZOLE SODIUM 40 MG PO TBEC
40.0000 mg | DELAYED_RELEASE_TABLET | Freq: Every day | ORAL | Status: DC
  Administered 2014-01-18 – 2014-01-20 (×3): 40 mg via ORAL
  Filled 2014-01-17 (×3): qty 1

## 2014-01-17 MED ORDER — KETOROLAC TROMETHAMINE 15 MG/ML IJ SOLN
15.0000 mg | Freq: Once | INTRAMUSCULAR | Status: AC
  Administered 2014-01-18: 15 mg via INTRAVENOUS
  Filled 2014-01-17: qty 1

## 2014-01-17 MED ORDER — SIMVASTATIN 20 MG PO TABS
20.0000 mg | ORAL_TABLET | Freq: Every day | ORAL | Status: DC
  Filled 2014-01-17: qty 1

## 2014-01-17 MED ORDER — DILTIAZEM HCL ER COATED BEADS 360 MG PO CP24
360.0000 mg | ORAL_CAPSULE | Freq: Every day | ORAL | Status: DC
  Filled 2014-01-17: qty 1

## 2014-01-17 MED ORDER — DICYCLOMINE HCL 10 MG PO CAPS
10.0000 mg | ORAL_CAPSULE | Freq: Three times a day (TID) | ORAL | Status: DC | PRN
  Filled 2014-01-17: qty 1

## 2014-01-17 NOTE — Progress Notes (Signed)
Patient arrived on the floor

## 2014-01-17 NOTE — Progress Notes (Signed)
[place] TEAM 1 - Stepdown/ICU TEAM Progress Note  [name] EZM:[id number] DOB: August 18, 1940 DOA: 01/16/2014 PCP: Hunter Medin, MD  Admit HPI / Brief Narrative: 73 year old male with significant past medical history of diverticulosis, HTN, CAD, GERD, and PUD who presented with hematochezia. He had at least 3-4 frank bloody bowel movements over 24 hours. Maroon to dark in color at times. Denied any prior hx of this. Has hx diverticulitis. Stated he takes excedrin migraine and ASA daily. No increased dosing recently. He states his Gastroenterologist is Dr. Fuller Mccarthy, and that he had a colonoscopy within the last 5 years that showed diverticulosis, plus internal/external hemorrhoids.   Presented to the ER afebrile, hemodynamically stable. HR 50s-70s. BP 130s-160s. Satting 91-98% on RA. WBC 10.1, Hgb 14.7, Cr 0.69, BUN 21. Hemoccult positive. CT abd noted chronic diverticulosis without diverticulitis. No SBO. Stable masses in R and L kidney.   HPI/Subjective: Pt clarifies that his bleeding was initially quite voluminous and bright red, then later in the day became more maroon in color, but was never melanotic.  He states that his B LQ abdom cramping pain is chronic and typcial of his IBS.  He denies cp, sob, n/v, dizziness, or HA.  He is very hungry.    Assessment/Mccarthy:  GIB - likely diverticular  Appears to be slowing - is hemodynamically stable - may prove to be self limited - keep on clears in case proves to be refractory and require investigation - doubt further intervention will be necessary  Acute blood loss anemia due to GIB Hgb has declined significantly, but not to point of indicating transfsion - will check CBC Q12 as pt appears stable at this time   IBS Followed by Dr. Fuller Mccarthy - resume bentyl prn   HTN Resume home BP meds as pt stable w/ climbing BP  Hx of nonobstructive CAD  left heart catheterization done in 2005 showed 30% left main stenosis, an extensively calcified LAD with  moderate plaquing, and EF was 60%  Hx of PACs/PVCs  on Toprol XL and diltiazem CD  Tobacco abuse Counseled on need to stop smoking   HLD Resume home medical tx  Code Status: FULL Family Communication: no family present at time of exam Disposition Mccarthy: stable for medical bed   Consultants: GI - [name]  Procedures: none  Antibiotics: none  DVT prophylaxis: SCDs  Objective: Blood pressure 151/79, pulse 84, temperature 97.8 F (36.6 C), temperature source Oral, resp. rate 15, height 5\' 6"  (1.676 m), weight 56.11 kg (123 lb 11.2 oz), SpO2 93.00%.  Intake/Output Summary (Last 24 hours) at 01/17/14 1130 Last data filed at 01/17/14 0909  Gross per 24 hour  Intake   1280 ml  Output    275 ml  Net   1005 ml   Exam: General: No acute respiratory distress Lungs: Clear to auscultation bilaterally without wheezes or crackles Cardiovascular: Regular rate and rhythm without murmur gallop or rub normal S1 and S2 Abdomen: Nontender, nondistended, soft, bowel sounds positive, no rebound, no ascites, no appreciable mass Extremities: No significant cyanosis, clubbing, or edema bilateral lower extremities  Data Reviewed: Basic Metabolic Panel:  Recent Labs Lab 01/16/14 1303 01/17/14 0316  NA 141 141  K 4.3 4.0  CL 105 105  CO2 24 22  GLUCOSE 93 89  BUN 21 17  CREATININE 0.69 0.67  CALCIUM 9.6 8.5   Liver Function Tests:  Recent Labs Lab 01/16/14 1303 01/17/14 0316  AST 20 16  ALT 26 21  ALKPHOS 81 66  BILITOT 0.3 0.5  PROT 7.0 5.7*  ALBUMIN 3.7 3.1*    Recent Labs Lab 01/16/14 1303  LIPASE 27   Coags:  Recent Labs Lab 01/16/14 1242  INR 0.97   CBC:  Recent Labs Lab 01/16/14 1303 01/16/14 1724 01/16/14 1921 01/17/14 0316 01/17/14 1025  WBC 10.1  --  10.6* 10.5 9.0  NEUTROABS 7.4  --  7.5 7.7 6.9  HGB 14.7 13.4 13.3 11.8* 12.6*  HCT 44.2 39.7 39.0 35.6* 37.6*  MCV 95.9  --  93.5 94.2 96.4  PLT 264  --  250 236 246    Recent Results  (from the past 240 hour(s))  MRSA PCR SCREENING     Status: None   Collection Time    01/16/14  8:49 PM      Result Value Ref Range Status   MRSA by PCR NEGATIVE  NEGATIVE Final   Comment:            The GeneXpert MRSA Assay (FDA     approved for NASAL specimens     only), is one component of a     comprehensive MRSA colonization     surveillance program. It is not     intended to diagnose MRSA     infection nor to guide or     monitor treatment for     MRSA infections.     Studies:  Recent x-ray studies have been reviewed in detail by the Attending Physician  Scheduled Meds:  Scheduled Meds: . diltiazem  360 mg Oral Daily  . metoprolol succinate  25 mg Oral Daily  . pantoprazole (PROTONIX) IV  40 mg Intravenous Q12H  . pravastatin  40 mg Oral QHS  . sodium chloride  3 mL Intravenous Q12H    Time spent on care of this patient: 35 mins   [name] , MD   [facility] Office  [phone] Pager - Text Page per [name] as per below:  On-Call/Text Page:      [name]      password [email or web address]  If 7PM-7AM, please contact night-coverage [email or web address] Password [email or web address] 01/17/2014, 11:30 AM   LOS: 1 day

## 2014-01-18 DIAGNOSIS — I2510 Atherosclerotic heart disease of native coronary artery without angina pectoris: Secondary | ICD-10-CM

## 2014-01-18 DIAGNOSIS — I10 Essential (primary) hypertension: Secondary | ICD-10-CM

## 2014-01-18 LAB — COMPREHENSIVE METABOLIC PANEL
ALK PHOS: 73 U/L (ref 39–117)
ALT: 36 U/L (ref 0–53)
AST: 24 U/L (ref 0–37)
Albumin: 3.1 g/dL — ABNORMAL LOW (ref 3.5–5.2)
Anion gap: 10 (ref 5–15)
BUN: 14 mg/dL (ref 6–23)
CALCIUM: 8.6 mg/dL (ref 8.4–10.5)
CO2: 25 mEq/L (ref 19–32)
Chloride: 111 mEq/L (ref 96–112)
Creatinine, Ser: 0.78 mg/dL (ref 0.50–1.35)
GFR calc non Af Amer: 87 mL/min — ABNORMAL LOW (ref >90)
GLUCOSE: 88 mg/dL (ref 70–99)
POTASSIUM: 4 meq/L (ref 3.7–5.3)
Sodium: 146 mEq/L (ref 137–147)
TOTAL PROTEIN: 5.7 g/dL — AB (ref 6.0–8.3)
Total Bilirubin: 0.5 mg/dL (ref 0.3–1.2)

## 2014-01-18 LAB — CBC
HEMATOCRIT: 33.4 % — AB (ref 39.0–52.0)
HEMATOCRIT: 36.1 % — AB (ref 39.0–52.0)
HEMOGLOBIN: 11.2 g/dL — AB (ref 13.0–17.0)
Hemoglobin: 12.1 g/dL — ABNORMAL LOW (ref 13.0–17.0)
MCH: 31.3 pg (ref 26.0–34.0)
MCH: 32.2 pg (ref 26.0–34.0)
MCHC: 33.5 g/dL (ref 30.0–36.0)
MCHC: 33.5 g/dL (ref 30.0–36.0)
MCV: 93.3 fL (ref 78.0–100.0)
MCV: 96 fL (ref 78.0–100.0)
Platelets: 242 10*3/uL (ref 150–400)
Platelets: 266 10*3/uL (ref 150–400)
RBC: 3.58 MIL/uL — ABNORMAL LOW (ref 4.22–5.81)
RBC: 3.76 MIL/uL — AB (ref 4.22–5.81)
RDW: 14 % (ref 11.5–15.5)
RDW: 14.1 % (ref 11.5–15.5)
WBC: 8.8 10*3/uL (ref 4.0–10.5)
WBC: 11.4 10*3/uL — AB (ref 4.0–10.5)

## 2014-01-18 NOTE — Progress Notes (Signed)
Physician paged for pain medicine.

## 2014-01-18 NOTE — ED Provider Notes (Signed)
Medical screening examination/treatment/procedure(s) were performed by non-physician practitioner and as supervising physician I was immediately available for consultation/collaboration.   EKG Interpretation None        Francine Graven, DO 01/18/14 1610

## 2014-01-18 NOTE — Progress Notes (Signed)
PATIENT DETAILS Name: [name] Age: 73 y.o. Sex: male Date of Birth: Jun 14, 1940 Admit Date: 01/16/2014 Admitting Physician Shanda Howells, MD PCP:Cho, Gwyndolyn Saxon, MD  Subjective: Doing well today. Not actively bleeding. Last bleeding episode 10pm last night with BM. Hemoglobin stable.   Assessment/Plan: Lower GI Bleed - likely diverticular source - colonoscopy 2 years ago showed moderate diverticulosis and internal/external hemorrhoids - patient states bleeding episodes are decreasing; last bleed last night at 10 pm with BM - bleed appears to be slowing; may prove to be self limited currently hemodynamically stable  - doubt further intervention will be necessary   Acute blood loss anemia due to GIB  - Hgb has declined significantly since admission, but not to point of indicating transfusion  - will check CBC regularly as pt appears stable at this time   IBS  - Followed by Dr. Fuller Plan  - resume bentyl prn   HTN  - Initially held, but has BP creeping up-to be started on Toprol and Cardizem. BP remains stable.  Hx of nonobstructive CAD  - left heart catheterization done in 2005 showed 30% left main stenosis, an extensively calcified LAD with moderate plaquing, and EF was 60%   Hx of PACs/PVCs  - on Toprol XL and diltiazem CD   Tobacco abuse  - Counseled on need to stop smoking   HLD  - Resume home medical tx  Disposition: Remain inpatient- home 8/14 if no further hematochezia  DVT Prophylaxis: SCD's  Code Status: Full code   Family Communication None  Procedures:  None  CONSULTS:  None  MEDICATIONS: Scheduled Meds: . diltiazem  360 mg Oral Daily  . metoprolol succinate  25 mg Oral QHS  . metoprolol succinate  50 mg Oral Daily  . pantoprazole  40 mg Oral Q1200  . pravastatin  40 mg Oral QHS  . sodium chloride  3 mL Intravenous Q12H   Continuous Infusions: . sodium chloride 75 mL/hr at 01/18/14 0021   PRN Meds:.dicyclomine,  traZODone  Antibiotics: Anti-infectives   None       PHYSICAL EXAM: Vital signs in last 24 hours: Filed Vitals:   01/17/14 1550 01/17/14 1853 01/17/14 2102 01/18/14 0500  BP: 127/64 145/75 130/70 132/74  Pulse: 71 79 80 80  Temp: 98.5 F (36.9 C) 98.4 F (36.9 C) 98.5 F (36.9 C) 97.7 F (36.5 C)  TempSrc: Oral Oral Oral Oral  Resp: 15 20 18 20   Height:      Weight:    56.019 kg (123 lb 8 oz)  SpO2: 96% 100% 98% 96%    Weight change: 0.31 kg (10.9 oz) Filed Weights   01/16/14 2025 01/17/14 0900 01/18/14 0500  Weight: 55.8 kg (123 lb 0.3 oz) 56.11 kg (123 lb 11.2 oz) 56.019 kg (123 lb 8 oz)   Body mass index is 19.94 kg/(m^2).   Gen Exam: Awake and alert with clear speech.   Neck: Supple, No JVD.   Chest: B/L Clear.   CVS: S1 S2 Regular, no murmurs.  Abdomen: soft, BS +, non tender, non distended.  Extremities: no edema, lower extremities warm to touch. Neurologic: Non Focal.   Skin: No Rash.   Wounds: N/A.     LAB RESULTS: CBC  Recent Labs Lab 01/16/14 1303  01/16/14 1921 01/17/14 0316 01/17/14 1025 01/17/14 1851 01/18/14 0610  WBC 10.1  --  10.6* 10.5 9.0 9.9 8.8  HGB 14.7  < > 13.3 11.8* 12.6* 11.8* 11.2*  HCT 44.2  < > 39.0 35.6* 37.6* 35.3* 33.4*  PLT 264  --  250 236 246 254 242  MCV 95.9  --  93.5 94.2 96.4 94.4 93.3  MCH 31.9  --  31.9 31.2 32.3 31.6 31.3  MCHC 33.3  --  34.1 33.1 33.5 33.4 33.5  RDW 14.4  --  14.3 14.4 14.3 14.3 14.0  LYMPHSABS 1.9  --  2.3 2.0 1.5  --   --   MONOABS 0.7  --  0.7 0.6 0.5  --   --   EOSABS 0.1  --  0.1 0.1 0.1  --   --   BASOSABS 0.1  --  0.1 0.0 0.0  --   --   < > = values in this interval not displayed.  MICROBIOLOGY: Recent Results (from the past 240 hour(s))  MRSA PCR SCREENING     Status: None   Collection Time    01/16/14  8:49 PM      Result Value Ref Range Status   MRSA by PCR NEGATIVE  NEGATIVE Final   Comment:            The GeneXpert MRSA Assay (FDA     approved for NASAL specimens      only), is one component of a     comprehensive MRSA colonization     surveillance program. It is not     intended to diagnose MRSA     infection nor to guide or     monitor treatment for     MRSA infections.    RADIOLOGY STUDIES/RESULTS: Ct Abdomen Pelvis W Contrast  01/16/2014   CLINICAL DATA:  Abdominal pain and diarrhea  EXAM: CT ABDOMEN AND PELVIS WITH CONTRAST  TECHNIQUE: Multidetector CT imaging of the abdomen and pelvis was performed using the standard protocol following bolus administration of intravenous contrast. Oral contrast was also administered.  CONTRAST:  128mL OMNIPAQUE IOHEXOL 300 MG/ML  SOLN  COMPARISON:  August 11, 2013 and August 10, 2009  FINDINGS: Lung bases are clear. There are multiple foci of coronary artery calcification.  Liver is prominent, measuring 16.4 cm in length. There is fatty change in the liver. No focal liver lesions are identified. The gallbladder wall is not appreciably thickened. There is no biliary duct dilatation.  Spleen, pancreas, and right adrenal appear normal. There is a stable left adrenal mass measuring 2.9 x 2.7 cm.  There is again noted a cyst in the mid left kidney measuring There are stable masses in the right kidney posteriorly which cannot be classified as simple cysts. One of these masses measures 0.9 x 0.9 cm. The other mass measures 1.2 x 1.0 cm. These masses have remained stable since 2011. There is no hydronephrosis on either side. There is no renal or ureteral calculus on either side.  In the pelvis, there is stable mild thickening of the posterior wall of the urinary bladder. Urinary bladder otherwise appears normal. Rectum is distended with air. There is extensive diverticulosis throughout the sigmoid colon with extensive muscular hypertrophy due to diverticulosis throughout the sigmoid colon, stable in appearance since 2011. A lesser degree of wall thickening is noted in the at ascending colon, likewise stable and felt to be due to  diverticulosis. There is no surrounding mesenteric thickening or colonic/pericolonic abscess. There is no pelvic mass or fluid collection. The appendix appears normal. Prostate is absent.  There is no bowel obstruction. No free air or portal venous air. There is no ascites, adenopathy, or  abscess in the abdomen or pelvis. There is extensive atherosclerotic change throughout the aorta and iliac arteries. There is also extensive renal artery calcification bilaterally. There is no abdominal aortic aneurysm. There is postoperative change in the lower lumbar spine. There are no blastic or lytic bone lesions apparent.  IMPRESSION: Extensive muscular hypertrophy from chronic diverticulosis, most marked throughout the sigmoid colon. There is no frank diverticulitis. There is no bowel obstruction. No abscess. Appendix appears normal.  Prostate absent.  Liver prominent with fatty change.  Stable left adrenal mass. Stable small masses in the right kidney since 2011 consistent with benign etiology. Large cyst in the left kidney, stable. Smaller cysts are also noted in the left kidney. No hydronephrosis on either side. No renal or ureteral calculi.  Mild thickening of the posterior wall of the urinary bladder is chronic and stable.   Electronically Signed   By: Lowella Grip M.D.   On: 01/16/2014 17:03    [employer] [facility] Pager:[phone]  If 7PM-7AM, please contact night-coverage [email or web address] Password [email or web address] 01/18/2014, 9:07 AM   LOS: 2 days   **Disclaimer: This note may have been dictated with voice recognition software. Similar sounding words can inadvertently be transcribed and this note may contain transcription errors which may not have been corrected upon publication of note.**  Attending Patient was seen, examined,treatment plan was discussed with the Physician extender. I have directly reviewed the clinical findings, lab, imaging studies and management of  this patient in detail. I have made the necessary changes to the above noted documentation, and agree with the documentation, as recorded by the Physician extender.  Nena Alexander MD [facility].

## 2014-01-19 ENCOUNTER — Encounter (HOSPITAL_COMMUNITY): Payer: Self-pay | Admitting: Physician Assistant

## 2014-01-19 DIAGNOSIS — E785 Hyperlipidemia, unspecified: Secondary | ICD-10-CM

## 2014-01-19 DIAGNOSIS — K5731 Diverticulosis of large intestine without perforation or abscess with bleeding: Principal | ICD-10-CM

## 2014-01-19 LAB — CBC
HEMATOCRIT: 32.9 % — AB (ref 39.0–52.0)
HEMOGLOBIN: 11.1 g/dL — AB (ref 13.0–17.0)
MCH: 32.2 pg (ref 26.0–34.0)
MCHC: 33.7 g/dL (ref 30.0–36.0)
MCV: 95.4 fL (ref 78.0–100.0)
Platelets: 241 10*3/uL (ref 150–400)
RBC: 3.45 MIL/uL — ABNORMAL LOW (ref 4.22–5.81)
RDW: 14.1 % (ref 11.5–15.5)
WBC: 8.6 10*3/uL (ref 4.0–10.5)

## 2014-01-19 MED ORDER — MAGNESIUM CITRATE PO SOLN
1.0000 | Freq: Once | ORAL | Status: AC
  Administered 2014-01-19: 1 via ORAL
  Filled 2014-01-19: qty 296

## 2014-01-19 MED ORDER — ACETAMINOPHEN 325 MG PO TABS
650.0000 mg | ORAL_TABLET | Freq: Four times a day (QID) | ORAL | Status: DC | PRN
  Administered 2014-01-19 – 2014-01-20 (×2): 650 mg via ORAL
  Filled 2014-01-19 (×2): qty 2

## 2014-01-19 MED ORDER — DILTIAZEM HCL ER COATED BEADS 360 MG PO CP24
360.0000 mg | ORAL_CAPSULE | Freq: Every day | ORAL | Status: DC
  Administered 2014-01-19 – 2014-01-20 (×2): 360 mg via ORAL
  Filled 2014-01-19 (×2): qty 1

## 2014-01-19 NOTE — Progress Notes (Signed)
PATIENT DETAILS Name: [name] Age: 73 y.o. Sex: male Date of Birth: [birth year] Admit Date: 01/16/2014 Admitting Physician Shanda Howells, MD PCP:Cho, Gwyndolyn Saxon, MD  Subjective: No pain. Had roughly four total bleeding episodes yesterday pm and this morning. Hemoglobin stable.   Assessment/Plan: Active Problems:   Lower GI bleed   GIB (gastrointestinal bleeding)  Lower GI Bleed  - likely diverticular source  - colonoscopy 2 years ago showed moderate diverticulosis and internal/external hemorrhoids  - bleeding episodes had been decreasing since admission, but had multiple episodes of bleeding last night and one this morning - still anticipate bleed to be self limited; currently hemodynamically stable  - currently on full liquid diet  - Consulting GI for continued bleeding  Acute blood loss anemia due to GIB  - Hgb has declined significantly since admission, but not to point of indicating transfusion  - still within normal limits and stable - will continue to check CBC regularly as pt appears stable at this time   IBS  - Followed by Dr. Fuller Plan  - resume bentyl prn   HTN  - Initially held, but BP slowly increased: was started on Toprol and Cardizem. BP remains stable.   Hx of nonobstructive CAD  - left heart catheterization done in 2005 showed 30% left main stenosis, an extensively calcified LAD with moderate plaquing, and EF was 60%   Hx of PACs/PVCs  - on Toprol XL and diltiazem CD   Tobacco abuse  - Counseled on need to stop smoking   HLD  - Resume home medical tx   Disposition:  Remain inpatient- home 8/15 if no further hematochezia   DVT Prophylaxis:  SCD's   Code Status:  Full code   Family Communication  None   Procedures:  None  CONSULTS:  GI   MEDICATIONS: Scheduled Meds: . diltiazem  360 mg Oral Daily  . metoprolol succinate  25 mg Oral QHS  . metoprolol succinate  50 mg Oral Daily  . pantoprazole  40 mg Oral Q1200  .  pravastatin  40 mg Oral QHS  . sodium chloride  3 mL Intravenous Q12H   Continuous Infusions: . sodium chloride 50 mL/hr at 01/18/14 1100   PRN Meds:.dicyclomine, traZODone  Antibiotics: Anti-infectives   None       PHYSICAL EXAM: Vital signs in last 24 hours: Filed Vitals:   01/18/14 0934 01/18/14 1327 01/18/14 2100 01/19/14 0417  BP: 130/70 144/75 142/71 162/72  Pulse: 86 88 67 78  Temp:  98.2 F (36.8 C) 98.3 F (36.8 C) 98.6 F (37 C)  TempSrc:  Oral Oral Oral  Resp:  18 18 18   Height:      Weight:    57 kg (125 lb 10.6 oz)  SpO2:  98% 99% 95%    Weight change: 0.89 kg (1 lb 15.4 oz) Filed Weights   01/17/14 0900 01/18/14 0500 01/19/14 0417  Weight: 56.11 kg (123 lb 11.2 oz) 56.019 kg (123 lb 8 oz) 57 kg (125 lb 10.6 oz)   Body mass index is 20.29 kg/(m^2).   Gen Exam: Awake and alert with clear speech.   Neck: Supple, No JVD.   Chest: B/L Clear.   CVS: S1 S2 Regular, no murmurs.  Abdomen: soft, BS +, non tender, non distended.  Extremities: no edema, lower extremities warm to touch. Neurologic: Non Focal.   Skin: No Rash.   Wounds: N/A.    Intake/Output from previous day:  Intake/Output Summary (  Last 24 hours) at 01/19/14 0930 Last data filed at 01/19/14 0420  Gross per 24 hour  Intake    240 ml  Output      0 ml  Net    240 ml     LAB RESULTS: CBC  Recent Labs Lab 01/16/14 1303  01/16/14 1921 01/17/14 0316 01/17/14 1025 01/17/14 1851 01/18/14 0610 01/18/14 2005 01/19/14 0500  WBC 10.1  --  10.6* 10.5 9.0 9.9 8.8 11.4* 8.6  HGB 14.7  < > 13.3 11.8* 12.6* 11.8* 11.2* 12.1* 11.1*  HCT 44.2  < > 39.0 35.6* 37.6* 35.3* 33.4* 36.1* 32.9*  PLT 264  --  250 236 246 254 242 266 241  MCV 95.9  --  93.5 94.2 96.4 94.4 93.3 96.0 95.4  MCH 31.9  --  31.9 31.2 32.3 31.6 31.3 32.2 32.2  MCHC 33.3  --  34.1 33.1 33.5 33.4 33.5 33.5 33.7  RDW 14.4  --  14.3 14.4 14.3 14.3 14.0 14.1 14.1  LYMPHSABS 1.9  --  2.3 2.0 1.5  --   --   --   --   MONOABS  0.7  --  0.7 0.6 0.5  --   --   --   --   EOSABS 0.1  --  0.1 0.1 0.1  --   --   --   --   BASOSABS 0.1  --  0.1 0.0 0.0  --   --   --   --   < > = values in this interval not displayed.  Chemistries   Recent Labs Lab 01/16/14 1303 01/17/14 0316 01/18/14 0610  NA 141 141 146  K 4.3 4.0 4.0  CL 105 105 111  CO2 24 22 25   GLUCOSE 93 89 88  BUN 21 17 14   CREATININE 0.69 0.67 0.78  CALCIUM 9.6 8.5 8.6   MICROBIOLOGY: Recent Results (from the past 240 hour(s))  MRSA PCR SCREENING     Status: None   Collection Time    01/16/14  8:49 PM      Result Value Ref Range Status   MRSA by PCR NEGATIVE  NEGATIVE Final   Comment:            The GeneXpert MRSA Assay (FDA     approved for NASAL specimens     only), is one component of a     comprehensive MRSA colonization     surveillance program. It is not     intended to diagnose MRSA     infection nor to guide or     monitor treatment for     MRSA infections.    RADIOLOGY STUDIES/RESULTS: Ct Abdomen Pelvis W Contrast  01/16/2014   CLINICAL DATA:  Abdominal pain and diarrhea  EXAM: CT ABDOMEN AND PELVIS WITH CONTRAST  TECHNIQUE: Multidetector CT imaging of the abdomen and pelvis was performed using the standard protocol following bolus administration of intravenous contrast. Oral contrast was also administered.  CONTRAST:  174mL OMNIPAQUE IOHEXOL 300 MG/ML  SOLN  COMPARISON:  August 11, 2013 and August 10, 2009  FINDINGS: Lung bases are clear. There are multiple foci of coronary artery calcification.  Liver is prominent, measuring 16.4 cm in length. There is fatty change in the liver. No focal liver lesions are identified. The gallbladder wall is not appreciably thickened. There is no biliary duct dilatation.  Spleen, pancreas, and right adrenal appear normal. There is a stable left adrenal mass measuring 2.9 x 2.7 cm.  There  is again noted a cyst in the mid left kidney measuring There are stable masses in the right kidney posteriorly which  cannot be classified as simple cysts. One of these masses measures 0.9 x 0.9 cm. The other mass measures 1.2 x 1.0 cm. These masses have remained stable since 2011. There is no hydronephrosis on either side. There is no renal or ureteral calculus on either side.  In the pelvis, there is stable mild thickening of the posterior wall of the urinary bladder. Urinary bladder otherwise appears normal. Rectum is distended with air. There is extensive diverticulosis throughout the sigmoid colon with extensive muscular hypertrophy due to diverticulosis throughout the sigmoid colon, stable in appearance since 2011. A lesser degree of wall thickening is noted in the at ascending colon, likewise stable and felt to be due to diverticulosis. There is no surrounding mesenteric thickening or colonic/pericolonic abscess. There is no pelvic mass or fluid collection. The appendix appears normal. Prostate is absent.  There is no bowel obstruction. No free air or portal venous air. There is no ascites, adenopathy, or abscess in the abdomen or pelvis. There is extensive atherosclerotic change throughout the aorta and iliac arteries. There is also extensive renal artery calcification bilaterally. There is no abdominal aortic aneurysm. There is postoperative change in the lower lumbar spine. There are no blastic or lytic bone lesions apparent.  IMPRESSION: Extensive muscular hypertrophy from chronic diverticulosis, most marked throughout the sigmoid colon. There is no frank diverticulitis. There is no bowel obstruction. No abscess. Appendix appears normal.  Prostate absent.  Liver prominent with fatty change.  Stable left adrenal mass. Stable small masses in the right kidney since 2011 consistent with benign etiology. Large cyst in the left kidney, stable. Smaller cysts are also noted in the left kidney. No hydronephrosis on either side. No renal or ureteral calculi.  Mild thickening of the posterior wall of the urinary bladder is chronic  and stable.   Electronically Signed   By: Lowella Grip M.D.   On: 01/16/2014 17:03    [employer]   [facility] Pager:[phone]  If 7PM-7AM, please contact night-coverage [email or web address] Password [email or web address] 01/19/2014, 9:30 AM   LOS: 3 days   **Disclaimer: This note may have been dictated with voice recognition software. Similar sounding words can inadvertently be transcribed and this note may contain transcription errors which may not have been corrected upon publication of note.**  Attending Patient was seen, examined,treatment plan was discussed with the Physician extender. I have directly reviewed the clinical findings, lab, imaging studies and management of this patient in detail. I have made the necessary changes to the above noted documentation, and agree with the documentation, as recorded by the Physician extender.  Nena Alexander MD [facility].

## 2014-01-19 NOTE — Consult Note (Signed)
[facility] Consult: 8:56 AM 01/19/2014  LOS: 3 days    Referring Provider: Dr Shanda Howells  Primary Care Physician:  Rodena Medin, MD Primary Gastroenterologist:  Dr. Lucio Edward    Reason for Consultation:  Painless hematochezia.    HPI: [name] is a 73 y.o. male. S/p 2009 radical prostatectomy for cancer, CAD, s/p ureteral stone lithotripsy.  Stable renal masses. DDD/DJD s/p lumbar fusion. Diverticulosis and hemorrhoids noted on the 3 colonoscopies and one flex sig of 2002 - 2013.  Flex sig in 2009 showed sigmoid erythema and biopsies proved ischemia.  Antral ulcer, gastritis, HH and esophageal stricture (dilated) on EGD in 2008.    Admitted 3 days ago with acute, painless hematochezia. The day of admission he had 3 episodes of hematochezia filling the toilet bowl.  No nausea, no anorexia.  Some non-severe LLQ pain and tenderness that improves after BMs.  Since admission continues to have 3 episodes bloody BM per day.  This AM was first day he started to see dark, more melenic looking, stool.   Tolerating full liquids.  CT scan 3 days ago shows: extensive diverticulosis throughout the sigmoid colon with extensive muscular hypertrophy due to diverticulosis, stable in appearance since 2011. A lesser degree of wall thickening is noted in the at  ascending colon, likewise stable and felt to be due to diverticulosis... extensive atherosclerotic change throughout the aorta and iliac arteries Hgb baseline of  ~ 14 to 15.  It was 14.7 in ED, dropped to 11.1 over 3 days.  Coags and BUN normal.   Takes 1400 mg of ASA daily in form of extra strength Excedrin, for headaches.  Takes Vicodin for back and shoulder pain.  Takes Omeprazole 20 mg BID.  No dysphagia.  No weight loss.  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  Smokes ~ 18 cigs per day.  No ETOH.  Bruises easily on arms but no nasal, urologic, GI bleeding in general.  + neurogenic claudication in legs (with rapid walking or longer sets of stairs).  + numbness in heels and metatarsals.   No swelling of limbs.  Occasional arrythmia, extra beats: no recent acceleration of this. No chest pain.  No DOE, no PND      Past Medical History  Diagnosis Date  . Hyperlipidemia   . Hypertension   . GERD (gastroesophageal reflux disease)   . PUD (peptic ulcer disease) 2008    antral ulcer  . Esophageal stricture 2008    peptic stricture: dilated.   . Prostate cancer 11/2007  . ED (erectile dysfunction)   . Benign neoplasm of adrenal gland   . Peyronie's disease   . Depression   . Diverticulosis 2002  . Coronary artery disease 2005    2005 Cath with 30% LAD, calcification/plaque of LAD, EF 60%  . Palpitations   . Hemorrhoids 2002  . Arthritis     DDD/ DJD  . Cataract     "  beginnings" per pt.  . History of kidney stones 2014    right ureteral lithotripsy 01/2013  . IBS (irritable bowel syndrome)     Past Surgical History  Procedure Laterality Date  . Spinal fusion  99,06  . Hernia repair    . Prostatectomy  12/2007    radical.  for prostate CA  . Tonsillectomy and adenoidectomy    . Nasal septum surgery      Prior to Admission medications   Medication Sig Start Date End Date Taking? Authorizing Provider  aspirin-acetaminophen-caffeine (EXCEDRIN MIGRAINE) [phone] MG per tablet Take 1 tablet by mouth every 6 (six) hours as needed for pain.   Yes Historical Provider, MD  dicyclomine (BENTYL) 10 MG capsule Take 1 capsule (10 mg total) by mouth 3 (three) times daily. 11/16/13  Yes Ladene Artist, MD  diltiazem [facility]) 360 MG 24 hr capsule Take 360 mg by mouth daily.   Yes Historical Provider, MD  HYDROcodone-acetaminophen  (NORCO/VICODIN) 5-325 MG per tablet Take 1 tablet by mouth every 6 (six) hours as needed for moderate pain.   Yes Historical Provider, MD  metoprolol succinate (TOPROL-XL) 25 MG 24 hr tablet Two tablets in the am and 1 tablet in the pm 08/30/13 08/31/14 Yes Larey Dresser, MD  Multiple Vitamin (MULTIVITAMIN WITH MINERALS) TABS tablet Take 1 tablet by mouth daily.   Yes Historical Provider, MD  omeprazole (PRILOSEC) 20 MG capsule Take 20 mg by mouth 2 (two) times daily.   Yes Historical Provider, MD  oxymetazoline (AFRIN) 0.05 % nasal spray Place 2 sprays into the nose at bedtime as needed for congestion.   Yes Historical Provider, MD  pravastatin (PRAVACHOL) 40 MG tablet Take 1 tablet (40 mg total) by mouth every evening. 08/21/13  Yes Larey Dresser, MD  traZODone (DESYREL) 50 MG tablet Take 50 mg by mouth at bedtime as needed for sleep.   Yes Historical Provider, MD    Scheduled Meds: . diltiazem  360 mg Oral Daily  . metoprolol succinate  25 mg Oral QHS  . metoprolol succinate  50 mg Oral Daily  . pantoprazole  40 mg Oral Q1200  . pravastatin  40 mg Oral QHS  . sodium chloride  3 mL Intravenous Q12H   Infusions: . sodium chloride 50 mL/hr at 01/18/14 1100   PRN Meds: dicyclomine, traZODone   Allergies as of 01/16/2014 - Review Complete 01/16/2014  Allergen Reaction Noted  . Etodolac  01/19/2007  . Sulfonamide derivatives Itching   . Pseudoephedrine Palpitations 01/19/2007    Family History  Problem Relation Age of Onset  . Colon cancer Maternal Aunt 66  . Heart attack Father 28  . Aneurysm Father   . Lymphoma Maternal Aunt   . Bone cancer Maternal Grandfather     History   Social History  . Marital Status: Married    Spouse Name: N/A    Number of Children: 2  . Years of Education: N/A   Occupational History  . Retired    Social History Main Topics  . Smoking status: Current Every Day Smoker -- 0.75 packs/day for 53 years    Types: Cigarettes  . Smokeless  tobacco: Never Used  . Alcohol Use: No  . Drug Use: No  . Sexual Activity: Yes   Other Topics Concern  . Not on file   Social History Narrative  . No narrative on file    PHYSICAL EXAM: Vital signs in last 24 hours: Filed Vitals:   01/19/14 0417  BP: 162/72  Pulse: 78  Temp: 98.6 F (37 C)  Resp: 18   Wt Readings from Last 3 Encounters:  01/19/14 57 kg (125 lb 10.6 oz)  11/16/13 59.875 kg (132 lb)  10/04/13 59.693 kg (131 lb 9.6 oz)   General: non-obese older WM who looks well.  Comfortable. Head:  No asymmetry or swelling  Eyes:  No icterus, some conj pallor Ears:  Not HOH  Nose:  No discharge or congestion. Mouth:  Clear, moist.  Excellent dentition.  Neck:  No mass, no TMG.  No JVD or bruits Lungs:  Clear bil.  No dyspnea or cough Heart: RRR.  No MRG Abdomen:  Soft, ND.  Active BS.  No HSM.  Moderate tenderness in LLQ.   Rectal: black, melenic type soft stool.  No mass   Musc/Skeltl: no joint deformity or erythema. Extremities:  No CCE  Neurologic:  Oriented x 3.  Good historian.  No tremor.  No limb weakness. Skin:  Few UE purpura.  No telangectasia, no sores, no rash.  Tattoos:  None.  Nodes:  No cervical adenopathy.    Psych:  Affect flat, alert and cooperative.  Fluid speech.   Intake/Output from previous day: 08/13 0701 - 08/14 0700 In: 240 [P.O.:240] Out: -  Intake/Output this shift:    LAB RESULTS:  Recent Labs  01/18/14 0610 01/18/14 2005 01/19/14 0500  WBC 8.8 11.4* 8.6  HGB 11.2* 12.1* 11.1*  HCT 33.4* 36.1* 32.9*  PLT 242 266 241  MCV                                     95  BMET Lab Results  Component Value Date   NA 146 01/18/2014   NA 141 01/17/2014   NA 141 01/16/2014   K 4.0 01/18/2014   K 4.0 01/17/2014   K 4.3 01/16/2014   CL 111 01/18/2014   CL 105 01/17/2014   CL 105 01/16/2014   CO2 25 01/18/2014   CO2 22 01/17/2014   CO2 24 01/16/2014   GLUCOSE 88 01/18/2014   GLUCOSE 89 01/17/2014   GLUCOSE 93 01/16/2014   BUN 14 01/18/2014     BUN 17 01/17/2014   BUN 21 01/16/2014   CREATININE 0.78 01/18/2014   CREATININE 0.67 01/17/2014   CREATININE 0.69 01/16/2014   CALCIUM 8.6 01/18/2014   CALCIUM 8.5 01/17/2014   CALCIUM 9.6 01/16/2014   LFT  Recent Labs  01/16/14 1303 01/17/14 0316 01/18/14 0610  PROT 7.0 5.7* 5.7*  ALBUMIN 3.7 3.1* 3.1*  AST 20 16 24   ALT 26 21 36  ALKPHOS 81 66 73  BILITOT 0.3 0.5 0.5   PT/INR Lab Results  Component Value Date   INR 0.97 01/16/2014   INR 1.15 10/11/2009   Lipase     Component Value Date/Time   LIPASE 27 01/16/2014 1303     RADIOLOGY STUDIES: 01/16/2014  CT ABDOMEN AND PELVIS WITH CONTRAST FINDINGS:  Lung bases are clear. There are multiple foci of coronary artery  calcification.  Liver is prominent, measuring 16.4 cm in length. There is fatty  change in the liver. No focal liver lesions are identified. The  gallbladder wall is not appreciably thickened. There is no biliary  duct dilatation.  Spleen, pancreas, and right adrenal appear normal. There is a stable  left adrenal mass measuring 2.9 x 2.7 cm.  There is again noted a cyst in  the mid left kidney measuring There  are stable masses in the right kidney posteriorly which cannot be  classified as simple cysts. One of these masses measures 0.9 x 0.9  cm. The other mass measures 1.2 x 1.0 cm. These masses have remained  stable since 2011. There is no hydronephrosis on either side. There  is no renal or ureteral calculus on either side.  In the pelvis, there is stable mild thickening of the posterior wall  of the urinary bladder. Urinary bladder otherwise appears normal.  Rectum is distended with air. There is extensive diverticulosis  throughout the sigmoid colon with extensive muscular hypertrophy due  to diverticulosis throughout the sigmoid colon, stable in appearance  since 2011. A lesser degree of wall thickening is noted in the at  ascending colon, likewise stable and felt to be due to  diverticulosis. There  is no surrounding mesenteric thickening or  colonic/pericolonic abscess. There is no pelvic mass or fluid  collection. The appendix appears normal. Prostate is absent.  There is no bowel obstruction. No free air or portal venous air.  There is no ascites, adenopathy, or abscess in the abdomen or  pelvis. There is extensive atherosclerotic change throughout the  aorta and iliac arteries. There is also extensive renal artery  calcification bilaterally. There is no abdominal aortic aneurysm.  There is postoperative change in the lower lumbar spine. There are  no blastic or lytic bone lesions apparent.  IMPRESSION:  Extensive muscular hypertrophy from chronic diverticulosis, most  marked throughout the sigmoid colon. There is no frank  diverticulitis. There is no bowel obstruction. No abscess. Appendix  appears normal.  Prostate absent.  Liver prominent with fatty change.  Stable left adrenal mass. Stable small masses in the right kidney  since 2011 consistent with benign etiology. Large cyst in the left  kidney, stable. Smaller cysts are also noted in the left kidney. No  hydronephrosis on either side. No renal or ureteral calculi.  Mild thickening of the posterior wall of the urinary bladder is  chronic and stable.   ENDOSCOPIC STUDIES: 04/2012  Colonoscopy  Dr Fuller Plan INDICATIONS: 1) heme positive stool 2) hematochezia 3) change  in bowel habits ENDOSCOPIC IMPRESSION:  1) Moderate diverticulosis in the sigmoid to transverse colon  2) Internal and external hemorrhoids  RECOMMENDATIONS:  1) High fiber diet with liberal fluid intake.  2) Given your age, you will not need another colonoscopy for  colon cancer screening or polyp surveillance. These types of tests  usually stop around age 60.  03/2008  Flex Sig  Dr Fuller Plan For:  Diarrhea.  DIVERTICULOSIS: Descending Colon to Sigmoid Colon. Not bleeding. ICD9:  Diverticulosis: 562.10.  MUCOSAL ABNORMALITY: Sigmoid Colon. Erythema  present. Comments:  patchy erythema associated with diverticulosis.  Pathology: COLON, SIGMOID, BIOPSIES: HEMORRHAGE AND MUCOSAL EROSION WITH FEATURES CONSISTENT WITH ISCHEMIA.  03/2007  Colonoscopy Diverticulosis, internal/external rrhoids  01/2001  EGD  Dr Leanora Cover For dysphagia, abd pain Antral ulcer, esophageal stricture (was dilated), gastritis, [facility]  01/2001  Colonoscopy  Dr Velora Heckler.  Diverticulosis, hemorrhoids.     IMPRESSION:   *  Acute hematochezia.  Some left LQ pain and tenderness.  Known diverticulosis.  Suspect diverticular bleed. However given ischemia on sigmoid biopsy in 2009 and aorto-iliac vascular disease (but no notable mesenteric vascular disease on latest CT scan) ischemic colitis is also in DDX.   On rectal today stool is melenic, which may be old blood, but given extensive use of ASA, could have recurrent PUD  despite daily PPI .   *  Hx antral ulcer, gastritis, HH and esophageal stricture.  On daily PPI. Rule out PUD: less likely given normal BUN.   *  Hx Prostate cancer.  S/p radical prostatectomy 2009    PLAN:     *  Per Dr Olevia Perches:  Keep off ASA which is likely prolonging his bleeding time.  Strongly favors dx of diverticular bleed.  Keep on full liquids for now.  CBC in AM.    Azucena Freed  01/19/2014, 8:56 AM Pager: [phone] Attending MD note:   I have taken a history, examined the patient, and reviewed the chart. I agree with the Advanced Practitioner's impression and recommendations. Painless large volume hematochezia  Has slowed down since the admission 4 days ago. Likely diverticular bleed as per prior colonoscopies. He has mild LLQ abd. 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 Will give Mag citrate per his request to  Cleat the colon, continue full liquids. Will recheck CBC in am and if stable ,he may be able to go home to take care of his sick wife.We discussed extensively holding ASA f or 4-6 weeks and  even then not to exceed 2/day.  [facility] Pager # [phone]

## 2014-01-19 NOTE — Care Management Note (Signed)
    Page 1 of 1   01/19/2014     5:46:28 PM CARE MANAGEMENT NOTE 01/19/2014  Patient:  [name], [name]   Account Number:  [id number]  Date Initiated:  01/19/2014  Documentation initiated by:  Tomi Bamberger  Subjective/Objective Assessment:   dx gib  admit-lives with spouse.     Action/Plan:   Anticipated DC Date:  01/21/2014   Anticipated DC Plan:  [place]  CM consult      Choice offered to / List presented to:             Status of service:  In process, will continue to follow Medicare Important Message given?  YES (If response is "NO", the following Medicare IM given date fields will be blank) Date Medicare IM given:  01/19/2014 Medicare IM given by:  Tomi Bamberger Date Additional Medicare IM given:   Additional Medicare IM given by:    Discharge Disposition:    Per UR Regulation:  Reviewed for med. necessity/level of care/duration of stay  If discussed at [place] of Stay Meetings, dates discussed:    Comments:

## 2014-01-20 LAB — CBC
HCT: 33.1 % — ABNORMAL LOW (ref 39.0–52.0)
Hemoglobin: 11.3 g/dL — ABNORMAL LOW (ref 13.0–17.0)
MCH: 32.7 pg (ref 26.0–34.0)
MCHC: 34.1 g/dL (ref 30.0–36.0)
MCV: 95.7 fL (ref 78.0–100.0)
PLATELETS: 251 10*3/uL (ref 150–400)
RBC: 3.46 MIL/uL — ABNORMAL LOW (ref 4.22–5.81)
RDW: 14.2 % (ref 11.5–15.5)
WBC: 10.9 10*3/uL — ABNORMAL HIGH (ref 4.0–10.5)

## 2014-01-20 MED ORDER — DOXYCYCLINE HYCLATE 50 MG PO CAPS: 100.0000 mg | ORAL_CAPSULE | Freq: Two times a day (BID) | ORAL | Status: DC

## 2014-01-20 MED ORDER — PNEUMOCOCCAL VAC POLYVALENT 25 MCG/0.5ML IJ INJ: 0.5000 mL | INJECTION | INTRAMUSCULAR | Status: DC

## 2014-01-20 NOTE — Progress Notes (Signed)
DC home with daughter, verbally understood DC instructions, no questions ask

## 2014-01-20 NOTE — Progress Notes (Signed)
          Daily Rounding Note  01/20/2014, 9:55 AM  LOS: 4 days   SUBJECTIVE:       At least 2 brown BMs this AM.  No pain in abdomen.  No nausea.  Still on full liquids. Right index finger painful, slightly swollen where pulse ox was tightly taped.   OBJECTIVE:         Vital signs in last 24 hours:    Temp:  [98.3 F (36.8 C)-98.5 F (36.9 C)] 98.5 F (36.9 C) (08/15 0558) Pulse Rate:  [70-73] 70 (08/15 0558) Resp:  [16] 16 (08/15 0558) BP: (140-156)/(67-70) 140/70 mmHg (08/15 0558) SpO2:  [98 %-99 %] 98 % (08/15 0558) Last BM Date: 01/19/14 General: looks well.  comfortable   Heart: RRR Chest: clear bil. Abdomen: soft, NT, ND.  No mass or HSM.  Active BS  Extremities: no CCE.  No weakness, no tremor.  Slight swelling, erythema and tenderness of right index finger.  Neuro/Psych:  Pleasant, oriented x 3.  Fully alert.   Intake/Output from previous day: 08/14 0701 - 08/15 0700 In: 581.7 [I.V.:581.7] Out: -   Intake/Output this shift:    Lab Results:  Recent Labs  01/18/14 2005 01/19/14 0500 01/20/14 0521  WBC 11.4* 8.6 10.9*  HGB 12.1* 11.1* 11.3*  HCT 36.1* 32.9* 33.1*  PLT 266 241 251   BMET  Recent Labs  01/18/14 0610  NA 146  K 4.0  CL 111  CO2 25  GLUCOSE 88  BUN 14  CREATININE 0.78  CALCIUM 8.6   LFT  Recent Labs  01/18/14 0610  PROT 5.7*  ALBUMIN 3.1*  AST 24  ALT 36  ALKPHOS 73  BILITOT 0.5     ASSESMENT:   * Acute hematochezia. Resolved.  Diverticular bleed suspected in pt with known diverticulosis.    Hgb stable.  * Hx antral ulcer, gastritis, HH and esophageal stricture. On daily PPI. R * Hx Prostate cancer. S/p radical prostatectomy 2009 *  Chronic headaches.  Takes a lot of Excedrin.  Advised [name] to avoid ASA products for 2 weeks, after that ok to take low dose ASA and occasional prn Excedrin.  Advised pt to discuss mgt of headaches with his PMD.  *  ? Early infection right  index finger?     PLAN   *  Ok to discharge home.  GI follow up with Dr Fuller Plan PRN.  If recurrent significant LGI bleeding he has been advised to return to ED.  *  Avoid ASA products as above.  *  Daily PPI as PTA.    [name]  01/20/2014, 9:55 AM Pager: [phone].[email or web address] Attending MD note:   I have taken a history, examined the patient, and reviewed the chart. I agree with the Advanced Practitioner's impression and recommendations. Diverticular bleed, stable. OK to discharge.No  Eccedrin for head aches. Follow up with Dr W[facility] Pager # [phone]

## 2014-01-20 NOTE — Discharge Summary (Signed)
PATIENT DETAILS Name: [name] Age: 73 y.o. Sex: male Date of Birth: 1940-08-02 MRN: [id number]. Admit Date: 01/16/2014 Admitting Physician: Shanda Howells, MD PCP:Cho, Gwyndolyn Saxon, MD  Recommendations for Outpatient Follow-up:  1. Please check CBC at next visit 2. Started on empiric doxycycline for mild erythema/swelling/tenderness at tip of right index finger-suspected cellulitis- please evaluate at next visit  PRIMARY DISCHARGE DIAGNOSIS:  Active Problems:   Lower GI bleed   GIB (gastrointestinal bleeding)      PAST MEDICAL HISTORY: Past Medical History  Diagnosis Date  . Hyperlipidemia   . Hypertension   . GERD (gastroesophageal reflux disease)   . PUD (peptic ulcer disease) 2008    antral ulcer  . Esophageal stricture 2008    peptic stricture: dilated.   . Prostate cancer 11/2007  . ED (erectile dysfunction)   . Benign neoplasm of adrenal gland   . Peyronie's disease   . Depression   . Diverticulosis 2002  . Coronary artery disease 2005    2005 Cath with 30% LAD, calcification/plaque of LAD, EF 60%  . Palpitations   . Hemorrhoids 2002  . Arthritis     DDD/ DJD  . Cataract     "beginnings" per pt.  . History of kidney stones 2014    right ureteral lithotripsy 01/2013  . IBS (irritable bowel syndrome)     DISCHARGE MEDICATIONS:   Medication List    STOP taking these medications       aspirin-acetaminophen-caffeine 250-250-65 MG per tablet  Commonly known as:  EXCEDRIN MIGRAINE      TAKE these medications       dicyclomine 10 MG capsule  Commonly known as:  BENTYL  Take 1 capsule (10 mg total) by mouth 3 (three) times daily.     diltiazem 360 MG 24 hr capsule  Commonly known as:  TIAZAC  Take 360 mg by mouth daily.     doxycycline 50 MG capsule  Commonly known as:  VIBRAMYCIN  Take 2 capsules (100 mg total) by mouth 2 (two) times daily.     HYDROcodone-acetaminophen 5-325 MG per tablet  Commonly known as:  NORCO/VICODIN  Take 1 tablet by  mouth every 6 (six) hours as needed for moderate pain.     metoprolol succinate 25 MG 24 hr tablet  Commonly known as:  TOPROL-XL  Two tablets in the am and 1 tablet in the pm     multivitamin with minerals Tabs tablet  Take 1 tablet by mouth daily.     omeprazole 20 MG capsule  Commonly known as:  PRILOSEC  Take 20 mg by mouth 2 (two) times daily.     oxymetazoline 0.05 % nasal spray  Commonly known as:  AFRIN  Place 2 sprays into the nose at bedtime as needed for congestion.     pravastatin 40 MG tablet  Commonly known as:  PRAVACHOL  Take 1 tablet (40 mg total) by mouth every evening.     traZODone 50 MG tablet  Commonly known as:  DESYREL  Take 50 mg by mouth at bedtime as needed for sleep.        ALLERGIES:   Allergies  Allergen Reactions  . Etodolac     REACTION: diarrhea  . Sulfonamide Derivatives Itching  . Pseudoephedrine Palpitations    Heart race    BRIEF HPI:  See H&P, Labs, Consult and Test reports for all details in brief, patient was admitted for evaluation of hematochezia  CONSULTATIONS:   GI  PERTINENT  RADIOLOGIC STUDIES: Ct Abdomen Pelvis W Contrast  01/16/2014   CLINICAL DATA:  Abdominal pain and diarrhea  EXAM: CT ABDOMEN AND PELVIS WITH CONTRAST  TECHNIQUE: Multidetector CT imaging of the abdomen and pelvis was performed using the standard protocol following bolus administration of intravenous contrast. Oral contrast was also administered.  CONTRAST:  138mL OMNIPAQUE IOHEXOL 300 MG/ML  SOLN  COMPARISON:  August 11, 2013 and August 10, 2009  FINDINGS: Lung bases are clear. There are multiple foci of coronary artery calcification.  Liver is prominent, measuring 16.4 cm in length. There is fatty change in the liver. No focal liver lesions are identified. The gallbladder wall is not appreciably thickened. There is no biliary duct dilatation.  Spleen, pancreas, and right adrenal appear normal. There is a stable left adrenal mass measuring 2.9 x 2.7 cm.   There is again noted a cyst in the mid left kidney measuring There are stable masses in the right kidney posteriorly which cannot be classified as simple cysts. One of these masses measures 0.9 x 0.9 cm. The other mass measures 1.2 x 1.0 cm. These masses have remained stable since 2011. There is no hydronephrosis on either side. There is no renal or ureteral calculus on either side.  In the pelvis, there is stable mild thickening of the posterior wall of the urinary bladder. Urinary bladder otherwise appears normal. Rectum is distended with air. There is extensive diverticulosis throughout the sigmoid colon with extensive muscular hypertrophy due to diverticulosis throughout the sigmoid colon, stable in appearance since 2011. A lesser degree of wall thickening is noted in the at ascending colon, likewise stable and felt to be due to diverticulosis. There is no surrounding mesenteric thickening or colonic/pericolonic abscess. There is no pelvic mass or fluid collection. The appendix appears normal. Prostate is absent.  There is no bowel obstruction. No free air or portal venous air. There is no ascites, adenopathy, or abscess in the abdomen or pelvis. There is extensive atherosclerotic change throughout the aorta and iliac arteries. There is also extensive renal artery calcification bilaterally. There is no abdominal aortic aneurysm. There is postoperative change in the lower lumbar spine. There are no blastic or lytic bone lesions apparent.  IMPRESSION: Extensive muscular hypertrophy from chronic diverticulosis, most marked throughout the sigmoid colon. There is no frank diverticulitis. There is no bowel obstruction. No abscess. Appendix appears normal.  Prostate absent.  Liver prominent with fatty change.  Stable left adrenal mass. Stable small masses in the right kidney since 2011 consistent with benign etiology. Large cyst in the left kidney, stable. Smaller cysts are also noted in the left kidney. No  hydronephrosis on either side. No renal or ureteral calculi.  Mild thickening of the posterior wall of the urinary bladder is chronic and stable.   Electronically Signed   By: Lowella Grip M.D.   On: 01/16/2014 17:03     PERTINENT LAB RESULTS: CBC:  Recent Labs  01/19/14 0500 01/20/14 0521  WBC 8.6 10.9*  HGB 11.1* 11.3*  HCT 32.9* 33.1*  PLT 241 251   CMET CMP     Component Value Date/Time   NA 146 01/18/2014 0610   K 4.0 01/18/2014 0610   CL 111 01/18/2014 0610   CO2 25 01/18/2014 0610   GLUCOSE 88 01/18/2014 0610   BUN 14 01/18/2014 0610   CREATININE 0.78 01/18/2014 0610   CALCIUM 8.6 01/18/2014 0610   PROT 5.7* 01/18/2014 0610   ALBUMIN 3.1* 01/18/2014 0610   AST 24 01/18/2014  0610   ALT 36 01/18/2014 0610   ALKPHOS 73 01/18/2014 0610   BILITOT 0.5 01/18/2014 0610   GFRNONAA 87* 01/18/2014 0610   GFRAA >90 01/18/2014 0610    GFR Estimated Creatinine Clearance: 65.3 ml/min (by C-G formula based on Cr of 0.78). No results found for this basename: LIPASE, AMYLASE,  in the last 72 hours No results found for this basename: CKTOTAL, CKMB, CKMBINDEX, TROPONINI,  in the last 72 hours No components found with this basename: POCBNP,  No results found for this basename: DDIMER,  in the last 72 hours No results found for this basename: HGBA1C,  in the last 72 hours No results found for this basename: CHOL, HDL, LDLCALC, TRIG, CHOLHDL, LDLDIRECT,  in the last 72 hours No results found for this basename: TSH, T4TOTAL, FREET3, T3FREE, THYROIDAB,  in the last 72 hours No results found for this basename: VITAMINB12, FOLATE, FERRITIN, TIBC, IRON, RETICCTPCT,  in the last 72 hours Coags: No results found for this basename: PT, INR,  in the last 72 hours Microbiology: Recent Results (from the past 240 hour(s))  MRSA PCR SCREENING     Status: None   Collection Time    01/16/14  8:49 PM      Result Value Ref Range Status   MRSA by PCR NEGATIVE  NEGATIVE Final   Comment:            The  GeneXpert MRSA Assay (FDA     approved for NASAL specimens     only), is one component of a     comprehensive MRSA colonization     surveillance program. It is not     intended to diagnose MRSA     infection nor to guide or     monitor treatment for     MRSA infections.     BRIEF HOSPITAL COURSE:   Active Problems: Lower GI Bleed  - likely diverticular source, colonoscopy 2 years ago showed moderate diverticulosis and internal/external hemorrhoids  - Patient was admitted, given supportive care. Initially monitored in the step down unit, once bleeding slowed down patient was then transferred to a regular medical surgical unit. Patient continued to have intermittent hematochezia throughout his hospital stay however it has slowed down significantly. He has not had any hematochezia since last night, patient was seen in consultation by gastroenterology. Since no further bleeding, he is stable for discharge. He has been is instructed to avoid aspirin for at least 2-3 weeks.  Acute blood loss anemia due to GIB  - Hgb has declined significantly since admission, but not to point of indicating transfusion. Hemoglobin on admission was 14.7, on day of discharge 11.3. For the past few days, has been stable, with hemoglobin the range of 11-12. - Please check CBC at next visit  Right index finger cellulitis - Mild erythema, swelling and tenderness at the tip of his right index finger- no fluctuant area- patient claims that this occurred at the site of prior pulse oximetry tape. Empirically started on doxycycline on 8/15, please recheck at next visit. Patient also asked to do hot and cold compresses to the area.  IBS  - Followed by Dr. Fuller Plan  - resume bentyl prn   HTN  - Initially held, but BP slowly increased: was started on Toprol and Cardizem. BP remains stable.   Hx of nonobstructive CAD  - left heart catheterization done in 2005 showed 30% left main stenosis, an extensively calcified LAD with  moderate plaquing, and EF was 60%  -  Have asked patient to avoid aspirin for the next 2-3 weeks  Hx of PACs/PVCs  - on Toprol XL and diltiazem CD   Tobacco abuse  - Counseled on need to stop smoking   HLD  - Resume home medical tx   TODAY-DAY OF DISCHARGE:  Subjective:   [name] today has no headache,no chest abdominal pain,no new weakness tingling or numbness, feels much better wants to go home today.   Objective:   Blood pressure 140/70, pulse 70, temperature 98.5 F (36.9 C), temperature source Oral, resp. rate 16, height 5\' 6"  (1.676 m), weight 57 kg (125 lb 10.6 oz), SpO2 98.00%.  Intake/Output Summary (Last 24 hours) at 01/20/14 1210 Last data filed at 01/20/14 7622  Gross per 24 hour  Intake 581.67 ml  Output      0 ml  Net 581.67 ml   Filed Weights   01/17/14 0900 01/18/14 0500 01/19/14 0417  Weight: 56.11 kg (123 lb 11.2 oz) 56.019 kg (123 lb 8 oz) 57 kg (125 lb 10.6 oz)    Exam Awake Alert, Oriented *3, No new F.N deficits, Normal affect [place].AT,PERRAL Supple Neck,No JVD, No cervical lymphadenopathy appriciated.  Symmetrical Chest wall movement, Good air movement bilaterally, CTAB RRR,No Gallops,Rubs or new Murmurs, No Parasternal Heave +ve B.Sounds, Abd Soft, Non tender, No organomegaly appriciated, No rebound -guarding or rigidity. No Cyanosis, Clubbing or edema, No new Rash or bruise  DISCHARGE CONDITION: Stable  DISPOSITION: Home  DISCHARGE INSTRUCTIONS:    Activity:  As tolerated  Diet recommendation: Heart Healthy diet      Discharge Instructions   Call MD for:  redness, tenderness, or signs of infection (pain, swelling, redness, odor or green/yellow discharge around incision site)    Complete by:  As directed      Call MD for:    Complete by:  As directed   If rectal bleeding recurs     Diet - low sodium heart healthy    Complete by:  As directed      Increase activity slowly    Complete by:  As directed            Follow-up  Information   Follow up with Rodena Medin, MD. Schedule an appointment as soon as possible for a visit in 1 week.   Specialty:  Internal Medicine   Contact information:   [address] [place] [address] [phone]       Schedule an appointment as soon as possible for a visit with Norberto Sorenson T. Fuller Plan, MD. (As needed)    Specialty:  Gastroenterology   Contact information:   [address] [place] [address] [phone]      Total Time spent on discharge equals 45 minutes.  SignedOren Binet 01/20/2014 12:10 PM  **Disclaimer: This note may have been dictated with voice recognition software. Similar sounding words can inadvertently be transcribed and this note may contain transcription errors which may not have been corrected upon publication of note.**

## 2014-02-06 ENCOUNTER — Telehealth: Payer: Self-pay | Admitting: Cardiology

## 2014-02-06 NOTE — Telephone Encounter (Signed)
error 

## 2014-02-08 ENCOUNTER — Encounter: Payer: Self-pay | Admitting: Gastroenterology

## 2014-02-08 ENCOUNTER — Ambulatory Visit (INDEPENDENT_AMBULATORY_CARE_PROVIDER_SITE_OTHER): Payer: Medicare HMO | Admitting: Gastroenterology

## 2014-02-08 VITALS — BP 138/58 | HR 68 | Ht 64.5 in | Wt 128.4 lb

## 2014-02-08 DIAGNOSIS — D62 Acute posthemorrhagic anemia: Secondary | ICD-10-CM

## 2014-02-08 DIAGNOSIS — K5731 Diverticulosis of large intestine without perforation or abscess with bleeding: Secondary | ICD-10-CM

## 2014-02-08 DIAGNOSIS — K219 Gastro-esophageal reflux disease without esophagitis: Secondary | ICD-10-CM

## 2014-02-08 NOTE — Progress Notes (Signed)
    History of Present Illness: This is a 73 year old male who returns for followup after hospitalization for presumed diverticular bleed. I reviewed the GI notes and discharge summary in Epic from his hospitalization. He has had no recurrent bleeding. He states he is having 2-3 normal bowel movements daily. He states his energy level has been low and he has been slightly dizzy since discharge. Discharge hemoglobin was 11.3. He saw his primary physician, Dr. Lajuan Lines yesterday and blood work was performed and the results are pending.  Current Medications, Allergies, Past Medical History, Past Surgical History, Family History and Social History were reviewed in [employer] record.  Physical Exam: General: Well developed , well nourished, no acute distress Head: Normocephalic and atraumatic Eyes:  sclerae anicteric, EOMI Ears: Normal auditory acuity Mouth: No deformity or lesions Lungs: Clear throughout to auscultation Heart: Regular rate and rhythm; no murmurs, rubs or bruits Abdomen: Soft, non tender and non distended. No masses, hepatosplenomegaly or hernias noted. Normal Bowel sounds Musculoskeletal: Symmetrical with no gross deformities  Pulses:  Normal pulses noted Extremities: No clubbing, cyanosis, edema or deformities noted Neurological: Alert oriented x 4, grossly nonfocal Psychological:  Alert and cooperative. Normal mood and affect  Assessment and Recommendations:  1. Resolved diverticular bleed. Fatigue and dizziness could be related to mild blood loss anemia. Long-term high fiber diet with adequate water intake. Blood work pending from his primary physician. He is released to the care of his primary physician.  2. GERD. History of esophageal stricture. History of gastric ulcer. Continue standard antireflux measures and omeprazole 20 mg every morning. Minimize or avoid NSAID usage.  3. Irritable bowel syndrome. Continue Bentyl 3 times a day as needed. Continue  a daily probiotic if he finds this helpful for controlling his symptoms.

## 2014-02-08 NOTE — Patient Instructions (Signed)
Please have Dr. Laureen Abrahams office fax a copy of your recent lab work to [phone].  Thank you for choosing me and [facility].  Pricilla Riffle. Dagoberto Ligas., MD., Marval Regal

## 2014-03-18 ENCOUNTER — Emergency Department (HOSPITAL_COMMUNITY): Payer: Medicare HMO

## 2014-03-18 ENCOUNTER — Inpatient Hospital Stay (HOSPITAL_COMMUNITY)
Admission: EM | Admit: 2014-03-18 | Discharge: 2014-03-20 | DRG: 395 | Disposition: A | Discharge status: Home / Self Care | Payer: Medicare HMO | Attending: Internal Medicine | Admitting: Internal Medicine

## 2014-03-18 ENCOUNTER — Emergency Department (HOSPITAL_COMMUNITY)
Admission: EM | Admit: 2014-03-18 | Discharge: 2014-03-18 | Disposition: A | Discharge status: Home / Self Care | Payer: Medicare HMO | Source: Home / Self Care | Attending: Emergency Medicine | Admitting: Emergency Medicine

## 2014-03-18 ENCOUNTER — Encounter (HOSPITAL_COMMUNITY): Payer: Self-pay | Admitting: Emergency Medicine

## 2014-03-18 DIAGNOSIS — Z720 Tobacco use: Secondary | ICD-10-CM | POA: Diagnosis present

## 2014-03-18 DIAGNOSIS — F17210 Nicotine dependence, cigarettes, uncomplicated: Secondary | ICD-10-CM | POA: Diagnosis present

## 2014-03-18 DIAGNOSIS — Z87448 Personal history of other diseases of urinary system: Secondary | ICD-10-CM | POA: Insufficient documentation

## 2014-03-18 DIAGNOSIS — I2510 Atherosclerotic heart disease of native coronary artery without angina pectoris: Secondary | ICD-10-CM

## 2014-03-18 DIAGNOSIS — K559 Vascular disorder of intestine, unspecified: Secondary | ICD-10-CM

## 2014-03-18 DIAGNOSIS — K529 Noninfective gastroenteritis and colitis, unspecified: Secondary | ICD-10-CM | POA: Insufficient documentation

## 2014-03-18 DIAGNOSIS — Z79899 Other long term (current) drug therapy: Secondary | ICD-10-CM | POA: Insufficient documentation

## 2014-03-18 DIAGNOSIS — H269 Unspecified cataract: Secondary | ICD-10-CM

## 2014-03-18 DIAGNOSIS — Z87442 Personal history of urinary calculi: Secondary | ICD-10-CM | POA: Insufficient documentation

## 2014-03-18 DIAGNOSIS — K279 Peptic ulcer, site unspecified, unspecified as acute or chronic, without hemorrhage or perforation: Secondary | ICD-10-CM | POA: Diagnosis present

## 2014-03-18 DIAGNOSIS — Z981 Arthrodesis status: Secondary | ICD-10-CM | POA: Diagnosis not present

## 2014-03-18 DIAGNOSIS — K219 Gastro-esophageal reflux disease without esophagitis: Secondary | ICD-10-CM

## 2014-03-18 DIAGNOSIS — K5730 Diverticulosis of large intestine without perforation or abscess without bleeding: Secondary | ICD-10-CM | POA: Diagnosis present

## 2014-03-18 DIAGNOSIS — K921 Melena: Secondary | ICD-10-CM

## 2014-03-18 DIAGNOSIS — Z792 Long term (current) use of antibiotics: Secondary | ICD-10-CM

## 2014-03-18 DIAGNOSIS — K8050 Calculus of bile duct without cholangitis or cholecystitis without obstruction: Secondary | ICD-10-CM

## 2014-03-18 DIAGNOSIS — Z8546 Personal history of malignant neoplasm of prostate: Secondary | ICD-10-CM

## 2014-03-18 DIAGNOSIS — R197 Diarrhea, unspecified: Secondary | ICD-10-CM

## 2014-03-18 DIAGNOSIS — E785 Hyperlipidemia, unspecified: Secondary | ICD-10-CM | POA: Diagnosis present

## 2014-03-18 DIAGNOSIS — M1990 Unspecified osteoarthritis, unspecified site: Secondary | ICD-10-CM | POA: Insufficient documentation

## 2014-03-18 DIAGNOSIS — I10 Essential (primary) hypertension: Secondary | ICD-10-CM | POA: Diagnosis present

## 2014-03-18 DIAGNOSIS — F329 Major depressive disorder, single episode, unspecified: Secondary | ICD-10-CM

## 2014-03-18 DIAGNOSIS — R000 Tachycardia, unspecified: Secondary | ICD-10-CM

## 2014-03-18 DIAGNOSIS — E876 Hypokalemia: Secondary | ICD-10-CM

## 2014-03-18 DIAGNOSIS — K589 Irritable bowel syndrome without diarrhea: Secondary | ICD-10-CM | POA: Diagnosis present

## 2014-03-18 DIAGNOSIS — R51 Headache: Secondary | ICD-10-CM | POA: Diagnosis present

## 2014-03-18 DIAGNOSIS — R1032 Left lower quadrant pain: Secondary | ICD-10-CM

## 2014-03-18 LAB — CBC WITH DIFFERENTIAL/PLATELET
BASOS ABS: 0.1 10*3/uL (ref 0.0–0.1)
BASOS PCT: 0 % (ref 0–1)
Basophils Absolute: 0 10*3/uL (ref 0.0–0.1)
Basophils Relative: 0 % (ref 0–1)
EOS PCT: 2 % (ref 0–5)
EOS PCT: 1 % (ref 0–5)
Eosinophils Absolute: 0.3 10*3/uL (ref 0.0–0.7)
Eosinophils Absolute: 0.1 10*3/uL (ref 0.0–0.7)
HCT: 39.1 % (ref 39.0–52.0)
HEMATOCRIT: 38.6 % — AB (ref 39.0–52.0)
HEMOGLOBIN: 12.8 g/dL — AB (ref 13.0–17.0)
Hemoglobin: 13.1 g/dL (ref 13.0–17.0)
LYMPHS ABS: 0.6 10*3/uL — AB (ref 0.7–4.0)
LYMPHS PCT: 14 % (ref 12–46)
Lymphocytes Relative: 5 % — ABNORMAL LOW (ref 12–46)
Lymphs Abs: 1.9 10*3/uL (ref 0.7–4.0)
MCH: 30.2 pg (ref 26.0–34.0)
MCH: 29.9 pg (ref 26.0–34.0)
MCHC: 33.5 g/dL (ref 30.0–36.0)
MCHC: 33.2 g/dL (ref 30.0–36.0)
MCV: 90.1 fL (ref 78.0–100.0)
MCV: 90.2 fL (ref 78.0–100.0)
MONO ABS: 0.9 10*3/uL (ref 0.1–1.0)
MONOS PCT: 5 % (ref 3–12)
Monocytes Absolute: 0.7 10*3/uL (ref 0.1–1.0)
Monocytes Relative: 7 % (ref 3–12)
NEUTROS ABS: 10.5 10*3/uL — AB (ref 1.7–7.7)
Neutro Abs: 12.3 10*3/uL — ABNORMAL HIGH (ref 1.7–7.7)
Neutrophils Relative %: 77 % (ref 43–77)
Neutrophils Relative %: 89 % — ABNORMAL HIGH (ref 43–77)
PLATELETS: 328 10*3/uL (ref 150–400)
Platelets: 299 10*3/uL (ref 150–400)
RBC: 4.34 MIL/uL (ref 4.22–5.81)
RBC: 4.28 MIL/uL (ref 4.22–5.81)
RDW: 13 % (ref 11.5–15.5)
RDW: 13.3 % (ref 11.5–15.5)
WBC: 13.7 10*3/uL — AB (ref 4.0–10.5)
WBC: 13.7 10*3/uL — ABNORMAL HIGH (ref 4.0–10.5)

## 2014-03-18 LAB — COMPREHENSIVE METABOLIC PANEL
ALBUMIN: 3.7 g/dL (ref 3.5–5.2)
ALK PHOS: 94 U/L (ref 39–117)
ALT: 18 U/L (ref 0–53)
ALT: 16 U/L (ref 0–53)
ANION GAP: 12 (ref 5–15)
AST: 16 U/L (ref 0–37)
AST: 15 U/L (ref 0–37)
Albumin: 3.5 g/dL (ref 3.5–5.2)
Alkaline Phosphatase: 95 U/L (ref 39–117)
Anion gap: 15 (ref 5–15)
BUN: 16 mg/dL (ref 6–23)
BUN: 13 mg/dL (ref 6–23)
CALCIUM: 9.1 mg/dL (ref 8.4–10.5)
CHLORIDE: 105 meq/L (ref 96–112)
CO2: 22 meq/L (ref 19–32)
CO2: 24 mEq/L (ref 19–32)
CREATININE: 0.74 mg/dL (ref 0.50–1.35)
Calcium: 9.1 mg/dL (ref 8.4–10.5)
Chloride: 105 mEq/L (ref 96–112)
Creatinine, Ser: 0.67 mg/dL (ref 0.50–1.35)
GFR calc Af Amer: >90 mL/min (ref >90)
GFR calc non Af Amer: >90 mL/min (ref >90)
GFR, EST NON AFRICAN AMERICAN: 89 mL/min — AB (ref >90)
GLUCOSE: 113 mg/dL — AB (ref 70–99)
Glucose, Bld: 122 mg/dL — ABNORMAL HIGH (ref 70–99)
Potassium: 3.3 mEq/L — ABNORMAL LOW (ref 3.7–5.3)
Potassium: 3 mEq/L — ABNORMAL LOW (ref 3.7–5.3)
SODIUM: 142 meq/L (ref 137–147)
Sodium: 141 mEq/L (ref 137–147)
Total Bilirubin: 0.3 mg/dL (ref 0.3–1.2)
Total Bilirubin: 0.4 mg/dL (ref 0.3–1.2)
Total Protein: 7.3 g/dL (ref 6.0–8.3)
Total Protein: 7 g/dL (ref 6.0–8.3)

## 2014-03-18 LAB — MRSA PCR SCREENING: MRSA by PCR: NEGATIVE

## 2014-03-18 LAB — LIPASE, BLOOD
Lipase: 17 U/L (ref 11–59)
Lipase: 12 U/L (ref 11–59)

## 2014-03-18 LAB — I-STAT CG4 LACTIC ACID, ED: LACTIC ACID, VENOUS: 0.82 mmol/L (ref 0.5–2.2)

## 2014-03-18 LAB — POC OCCULT BLOOD, ED: FECAL OCCULT BLD: NEGATIVE

## 2014-03-18 MED ORDER — ONDANSETRON HCL 4 MG/2ML IJ SOLN
4.0000 mg | Freq: Once | INTRAMUSCULAR | Status: AC
  Administered 2014-03-18: 4 mg via INTRAVENOUS
  Filled 2014-03-18: qty 2

## 2014-03-18 MED ORDER — PANTOPRAZOLE SODIUM 40 MG PO TBEC
40.0000 mg | DELAYED_RELEASE_TABLET | Freq: Two times a day (BID) | ORAL | Status: DC
  Administered 2014-03-18 – 2014-03-19 (×3): 40 mg via ORAL
  Filled 2014-03-18 (×3): qty 1

## 2014-03-18 MED ORDER — PRAVASTATIN SODIUM 40 MG PO TABS
40.0000 mg | ORAL_TABLET | Freq: Every evening | ORAL | Status: DC
  Administered 2014-03-18 – 2014-03-19 (×2): 40 mg via ORAL
  Filled 2014-03-18 (×3): qty 1

## 2014-03-18 MED ORDER — DILTIAZEM HCL ER BEADS 240 MG PO CP24
360.0000 mg | ORAL_CAPSULE | Freq: Every day | ORAL | Status: DC
  Administered 2014-03-18 – 2014-03-20 (×3): 360 mg via ORAL
  Filled 2014-03-18 (×4): qty 1

## 2014-03-18 MED ORDER — HYDROMORPHONE HCL 1 MG/ML IJ SOLN
0.5000 mg | INTRAMUSCULAR | Status: DC | PRN
  Administered 2014-03-19 (×3): 0.5 mg via INTRAVENOUS
  Filled 2014-03-18 (×3): qty 1

## 2014-03-18 MED ORDER — TRAZODONE HCL 50 MG PO TABS
50.0000 mg | ORAL_TABLET | Freq: Every evening | ORAL | Status: DC | PRN
  Filled 2014-03-18: qty 1

## 2014-03-18 MED ORDER — HYDROMORPHONE HCL 1 MG/ML IJ SOLN
0.5000 mg | Freq: Once | INTRAMUSCULAR | Status: AC
  Administered 2014-03-18: 0.5 mg via INTRAVENOUS
  Filled 2014-03-18: qty 1

## 2014-03-18 MED ORDER — CIPROFLOXACIN HCL 500 MG PO TABS: 500.0000 mg | ORAL_TABLET | Freq: Two times a day (BID) | ORAL | Status: DC

## 2014-03-18 MED ORDER — CIPROFLOXACIN IN D5W 400 MG/200ML IV SOLN
400.0000 mg | Freq: Two times a day (BID) | INTRAVENOUS | Status: DC
  Administered 2014-03-18 – 2014-03-20 (×3): 400 mg via INTRAVENOUS
  Filled 2014-03-18 (×5): qty 200

## 2014-03-18 MED ORDER — METRONIDAZOLE IN NACL 5-0.79 MG/ML-% IV SOLN
500.0000 mg | Freq: Four times a day (QID) | INTRAVENOUS | Status: DC
  Administered 2014-03-19 – 2014-03-20 (×6): 500 mg via INTRAVENOUS
  Filled 2014-03-18 (×8): qty 100

## 2014-03-18 MED ORDER — IOHEXOL 300 MG/ML  SOLN
100.0000 mL | Freq: Once | INTRAMUSCULAR | Status: AC | PRN
  Administered 2014-03-18: 100 mL via INTRAVENOUS

## 2014-03-18 MED ORDER — METOPROLOL SUCCINATE ER 25 MG PO TB24
25.0000 mg | ORAL_TABLET | Freq: Two times a day (BID) | ORAL | Status: DC
  Administered 2014-03-18 – 2014-03-20 (×4): 25 mg via ORAL
  Filled 2014-03-18 (×5): qty 1

## 2014-03-18 MED ORDER — MORPHINE SULFATE 4 MG/ML IJ SOLN
4.0000 mg | Freq: Once | INTRAMUSCULAR | Status: AC
  Administered 2014-03-18: 4 mg via INTRAVENOUS
  Filled 2014-03-18: qty 1

## 2014-03-18 MED ORDER — METRONIDAZOLE IN NACL 5-0.79 MG/ML-% IV SOLN
500.0000 mg | Freq: Four times a day (QID) | INTRAVENOUS | Status: DC
  Administered 2014-03-18: 500 mg via INTRAVENOUS
  Filled 2014-03-18 (×3): qty 100

## 2014-03-18 MED ORDER — SODIUM CHLORIDE 0.9 % IV SOLN
Freq: Once | INTRAVENOUS | Status: AC
  Administered 2014-03-18: 15:00:00 via INTRAVENOUS

## 2014-03-18 MED ORDER — TOPIRAMATE 25 MG PO TABS
25.0000 mg | ORAL_TABLET | Freq: Every day | ORAL | Status: DC | PRN
  Administered 2014-03-18: 25 mg via ORAL
  Filled 2014-03-18: qty 1

## 2014-03-18 MED ORDER — POTASSIUM CHLORIDE CRYS ER 20 MEQ PO TBCR
40.0000 meq | EXTENDED_RELEASE_TABLET | Freq: Once | ORAL | Status: AC
  Administered 2014-03-18: 40 meq via ORAL
  Filled 2014-03-18: qty 2

## 2014-03-18 MED ORDER — METRONIDAZOLE 500 MG PO TABS: 500.0000 mg | ORAL_TABLET | Freq: Two times a day (BID) | ORAL | Status: DC

## 2014-03-18 MED ORDER — OXYCODONE-ACETAMINOPHEN 5-325 MG PO TABS: 1.0000 | ORAL_TABLET | ORAL | Status: DC | PRN

## 2014-03-18 MED ORDER — SODIUM CHLORIDE 0.9 % IV SOLN
INTRAVENOUS | Status: DC
  Administered 2014-03-18: 1000 mL via INTRAVENOUS
  Administered 2014-03-19 (×3): via INTRAVENOUS

## 2014-03-18 NOTE — ED Provider Notes (Signed)
CSN: [id number]     Arrival date & time 03/18/14  0147 History   First MD Initiated Contact with Patient 03/18/14 0328     Chief Complaint  Patient presents with  . Diarrhea     (Consider location/radiation/quality/duration/timing/severity/associated sxs/prior Treatment) HPI Comments: Patient here complaining of left lower quadrant pain and his diarrhea since Friday. History of diverticular bleed but denies any bloody stools. No fever or chills. For nausea vomiting. Denies any hematuria or dysuria. Symptoms persistent and nothing makes them better or worse. No treatment used prior to arrival.  Patient is a 73 y.o. male presenting with diarrhea. The history is provided by the patient and a relative.  Diarrhea   Past Medical History  Diagnosis Date  . Hyperlipidemia   . Hypertension   . GERD (gastroesophageal reflux disease)   . PUD (peptic ulcer disease) 2008    antral ulcer  . Esophageal stricture 2008    peptic stricture: dilated.   . Prostate cancer 11/2007  . ED (erectile dysfunction)   . Benign neoplasm of adrenal gland   . Peyronie's disease   . Depression   . Diverticulosis 2002  . Coronary artery disease 2005    2005 Cath with 30% LAD, calcification/plaque of LAD, EF 60%  . Palpitations   . Hemorrhoids 2002  . Arthritis     DDD/ DJD  . Cataract     "beginnings" per pt.  . History of kidney stones 2014    right ureteral lithotripsy 01/2013  . IBS (irritable bowel syndrome)    Past Surgical History  Procedure Laterality Date  . Spinal fusion  99,06  . Hernia repair    . Prostatectomy  12/2007    radical.  for prostate CA  . Tonsillectomy and adenoidectomy    . Nasal septum surgery     Family History  Problem Relation Age of Onset  . Colon cancer Maternal Aunt 73  . Heart attack Father 28  . Aneurysm Father   . Lymphoma Maternal Aunt   . Bone cancer Maternal Grandfather    History  Substance Use Topics  . Smoking status: Current Every Day Smoker -- 0.75  packs/day for 53 years    Types: Cigarettes  . Smokeless tobacco: Never Used  . Alcohol Use: No    Review of Systems  Gastrointestinal: Positive for diarrhea.  All other systems reviewed and are negative.     Allergies  Etodolac; Sulfonamide derivatives; and Pseudoephedrine  Home Medications   Prior to Admission medications   Medication Sig Start Date End Date Taking? Authorizing Provider  dicyclomine (BENTYL) 10 MG capsule Take 1 capsule (10 mg total) by mouth 3 (three) times daily. 11/16/13   Ladene Artist, MD  diltiazem [facility]) 360 MG 24 hr capsule Take 360 mg by mouth daily.    Historical Provider, MD  doxycycline (VIBRAMYCIN) 50 MG capsule Take 2 capsules (100 mg total) by mouth 2 (two) times daily. 01/20/14   Shanker Kristeen Mans, MD  HYDROcodone-acetaminophen (NORCO/VICODIN) 5-325 MG per tablet Take 1 tablet by mouth every 6 (six) hours as needed for moderate pain.    Historical Provider, MD  metoprolol succinate (TOPROL-XL) 25 MG 24 hr tablet Two tablets in the am and 1 tablet in the pm 08/30/13 08/31/14  Larey Dresser, MD  Multiple Vitamin (MULTIVITAMIN WITH MINERALS) TABS tablet Take 1 tablet by mouth daily.    Historical Provider, MD  omeprazole (PRILOSEC) 20 MG capsule Take 20 mg by mouth 2 (two) times daily.  Historical Provider, MD  oxymetazoline (AFRIN) 0.05 % nasal spray Place 2 sprays into the nose at bedtime as needed for congestion.    Historical Provider, MD  pravastatin (PRAVACHOL) 40 MG tablet Take 1 tablet (40 mg total) by mouth every evening. 08/21/13   Larey Dresser, MD  traZODone (DESYREL) 50 MG tablet Take 50 mg by mouth at bedtime as needed for sleep.    Historical Provider, MD   BP 149/61  Pulse 83  Temp(Src) 98 F (36.7 C) (Oral)  Resp 28  Ht 5\' 6"  (1.676 m)  Wt 130 lb (58.968 kg)  BMI 20.99 kg/m2  SpO2 96% Physical Exam  Nursing note and vitals reviewed. Constitutional: He is oriented to person, place, and time. He appears well-developed and  well-nourished.  Non-toxic appearance. No distress.  HENT:  Head: Normocephalic and atraumatic.  Eyes: Conjunctivae, EOM and lids are normal. Pupils are equal, round, and reactive to light.  Neck: Normal range of motion. Neck supple. No tracheal deviation present. No mass present.  Cardiovascular: Normal rate, regular rhythm and normal heart sounds.  Exam reveals no gallop.   No murmur heard. Pulmonary/Chest: Effort normal and breath sounds normal. No stridor. No respiratory distress. He has no decreased breath sounds. He has no wheezes. He has no rhonchi. He has no rales.  Abdominal: Soft. Normal appearance and bowel sounds are normal. He exhibits no distension. There is tenderness in the left lower quadrant. There is guarding. There is no rigidity, no rebound and no CVA tenderness.    Musculoskeletal: Normal range of motion. He exhibits no edema and no tenderness.  Neurological: He is alert and oriented to person, place, and time. He has normal strength. No cranial nerve deficit or sensory deficit. GCS eye subscore is 4. GCS verbal subscore is 5. GCS motor subscore is 6.  Skin: Skin is warm and dry. No abrasion and no rash noted.  Psychiatric: He has a normal mood and affect. His speech is normal and behavior is normal.    ED Course  Procedures (including critical care time) Labs Review Labs Reviewed  CBC WITH DIFFERENTIAL - Abnormal; Notable for the following:    WBC 13.7 (*)    Neutro Abs 10.5 (*)    All other components within normal limits  COMPREHENSIVE METABOLIC PANEL - Abnormal; Notable for the following:    Potassium 3.3 (*)    Glucose, Bld 122 (*)    GFR calc non Af Amer 89 (*)    All other components within normal limits  LIPASE, BLOOD  URINALYSIS, ROUTINE W REFLEX MICROSCOPIC    Imaging Review No results found.   EKG Interpretation None      MDM   Final diagnoses:  None    Patient given IV fluids, opiates, and anti-medics  here and feels better. Patient to  be treated for colitis and will followup with his Dr. next week. Return precautions given.    Leota Jacobsen, MD 03/18/14 [phone]

## 2014-03-18 NOTE — ED Notes (Signed)
Attempted Report 

## 2014-03-18 NOTE — ED Notes (Signed)
Report given.

## 2014-03-18 NOTE — ED Notes (Signed)
Pt. reports diarrhea onset Friday last week with generalized weakness , denies nausea or vomitting , intermittent LLQ pain , history of diverticulitis.

## 2014-03-18 NOTE — H&P (Signed)
Date: 03/18/2014               Patient Name:  [name] MRN: [id number]  DOB: 02-10-1941 Age / Sex: 73 y.o., male   PCP: Rodena Medin, MD              Medical Service: Internal Medicine Teaching Service              Attending Physician: Dr. Michel Bickers, MD    First Contact: Josiah Lobo, MS4 Pager: [phone]  Second Contact: Dr. Duwaine Maxin, DO  Pager: [phone]            After Hours (After 5p/  First Contact Pager: [phone]  weekends / holidays): Second Contact Pager: [phone]   Chief Complaint: abdominal pain and bloody diarrhea   History of Present Illness: [name] is a 73 year old gentleman with a history of diverticular bleed 08/15, hemorrhoids, diverticulosis, hypertension, GERD, CAD, IBS, prostate cancer s/p resection in 11/2007 who presents with LLQ abdominal pain and bloody diarrhea since Friday evening. Patient describes having intermittent pain, rating at 10/10, since onset throughout all of Saturday and up to presentation at the ED on Sunday early morning. Since Friday evening, he has had diarrhea three times throughout the night as he was working. On Saturday, he cannot recall the number of times but notes as "multiple." Every bowel movement resulted in bright blood mixed with stool and "mucus." He has had nausea and subsequent hyperventilation but denies vomiting. Patient estimates that he lost about 0.5 cup of blood overall. He also denies any fevers, night sweats, or chills. He also denies taking aspirin or NSAIDs since his last diverticular bleed.   Given the symptoms, he initially presented to the ED on 10/11 at 1:00 a.m. and CT scan had revealed colitis and patient was discharged on Cipro and Flagyl. After he returned home at 6 a.m., he took Vicodin and slept and then had another bloody bowel movement along with pain and returned later that day at 2:00 p.m. This time, he was started in IV Cipro, Flagyl, fluids, and given 0.5 mg Dilaudid. Hemoglobin at first ED  visit was 13.1 and was 12.8 at return visit.  He did not have time to fill his prescribed antibiotic and did not take of his prescribed medications today. He has been able to eat on Saturday but today has had no appetite and has not taken any of his medications. He denies any drastic weight loss. He denies chest pain, shortness of breath, fatigue, neurological symptoms, or myalgias. He does think this presentation is not as intense as the time he had his diverticular bleed and not due to his hemorrhoids. Patient has not had a bowel movement since this morning.   Meds: Current Facility-Administered Medications  Medication Dose Route Frequency Provider Last Rate Last Dose  . ciprofloxacin (CIPRO) IVPB 400 mg  400 mg Intravenous Q12H Orpah Greek, MD   400 mg at 03/18/14 1556  . metroNIDAZOLE (FLAGYL) IVPB 500 mg  500 mg Intravenous Q6H Orpah Greek, MD 100 mL/hr at 03/18/14 1700 500 mg at 03/18/14 1700    Allergies: Allergies as of 03/18/2014 - Review Complete 03/18/2014  Allergen Reaction Noted  . Etodolac  01/19/2007  . Sulfonamide derivatives Itching   . Pseudoephedrine Palpitations 01/19/2007   Past Medical History  Diagnosis Date  . Hyperlipidemia   . Hypertension   . GERD (gastroesophageal reflux disease)   . PUD (peptic ulcer disease) 2008    antral  ulcer  . Esophageal stricture 2008    peptic stricture: dilated.   . Prostate cancer 11/2007  . ED (erectile dysfunction)   . Benign neoplasm of adrenal gland   . Peyronie's disease   . Depression   . Diverticulosis 2002  . Coronary artery disease 2005    2005 Cath with 30% LAD, calcification/plaque of LAD, EF 60%  . Palpitations   . Hemorrhoids 2002  . Arthritis     DDD/ DJD  . Cataract     "beginnings" per pt.  . History of kidney stones 2014    right ureteral lithotripsy 01/2013  . IBS (irritable bowel syndrome)    Past Surgical History  Procedure Laterality Date  . Spinal fusion  99,06  . Hernia  repair    . Prostatectomy  12/2007    radical.  for prostate CA  . Tonsillectomy and adenoidectomy    . Nasal septum surgery     Family History  Problem Relation Age of Onset  . Colon cancer Maternal Aunt 9  . Heart attack Father 30  . Aneurysm Father   . Lymphoma Maternal Aunt   . Bone cancer Maternal Grandfather    History   Social History  . Marital Status: Married    Spouse Name: N/A    Number of Children: 2  . Years of Education: N/A   Occupational History  . Retired    Social History Main Topics  . Smoking status: Current Every Day Smoker -- 0.75 packs/day for 53 years    Types: Cigarettes  . Smokeless tobacco: Never Used  . Alcohol Use: No  . Drug Use: No  . Sexual Activity: Yes   Other Topics Concern  . Not on file   Social History Narrative  . No narrative on file    Review of Systems: Pertinent items are noted in HPI.  Physical Exam: Blood pressure 159/81, pulse 108, resp. rate 24, SpO2 95.00%. BP 159/81  Pulse 108  Resp 24  SpO2 95%  General Appearance:    Alert, cooperative, no distress  Back:     Symmetric, no curvature, ROM normal, no CVA tenderness  Lungs:     Clear to auscultation bilaterally, respirations unlabored  Chest wall:    No tenderness or deformity  Heart:    Increased rate and rhythm, S1 and S2 normal, no murmur,   rub or gallop  Abdomen:     Soft, nondistended, no organomegaly. Hyperactive bowel   sounds. Tender to palpation in LLQ. Some guarding.  Extremities:   Extremities normal, atraumatic, no cyanosis or edema  Pulses:   2+ and symmetric all extremities  Skin:   Skin color, texture, turgor normal, no rashes or lesions  Lymph nodes:   Cervical, supraclavicular, and axillary nodes normal    Lab results: Basic Metabolic Panel:  Recent Labs  03/18/14 0158 03/18/14 1450  NA 142 141  K 3.3* 3.0*  CL 105 105  CO2 22 24  GLUCOSE 122* 113*  BUN 16 13  CREATININE 0.74 0.67  CALCIUM 9.1 9.1   Liver Function  Tests:  Recent Labs  03/18/14 0158 03/18/14 1450  AST 16 15  ALT 18 16  ALKPHOS 95 94  BILITOT 0.3 0.4  PROT 7.3 7.0  ALBUMIN 3.7 3.5    Recent Labs  03/18/14 0158 03/18/14 1450  LIPASE 17 12   No results found for this basename: AMMONIA,  in the last 72 hours CBC:  Recent Labs  03/18/14 0158 03/18/14 1450  WBC 13.7* 13.7*  NEUTROABS 10.5* 12.3*  HGB 13.1 12.8*  HCT 39.1 38.6*  MCV 90.1 90.2  PLT 328 299   Imaging results:  Ct Abdomen Pelvis W Contrast  03/18/2014   CLINICAL DATA:  Acute onset diarrhea 1 day ago, LEFT lower quadrant pain. History of diverticulitis and inflammatory bowel syndrome.  EXAM: CT ABDOMEN AND PELVIS WITH CONTRAST  TECHNIQUE: Multidetector CT imaging of the abdomen and pelvis was performed using the standard protocol following bolus administration of intravenous contrast.  CONTRAST:  100 cc Omnipaque 300.  COMPARISON:  CT of the abdomen and pelvis January 16, 2014 and CT of the abdomen and pelvis August 11, 2013  FINDINGS: LUNG BASES: Included view of the lung bases are clear. Visualized heart and pericardium are unremarkable.  SOLID ORGANS: 2.8 x 3.1 cm LEFT adrenal mass, relatively unchanged from prior imaging, 55 Hounsfield units on the initial phase, 31 Hounsfield units on delayed. The liver, spleen, pancreas, gallbladder and RIGHT adrenal gland are unremarkable.  GASTROINTESTINAL TRACT: Extensive colonic diverticulosis. Market sigmoid bowel wall thickening of with slight descending colonic pericolonic inflammation. The stomach, small bowel are normal in course and caliber without inflammatory changes. Normal appendix.  KIDNEYS/ URINARY TRACT: Kidneys are orthotopic, demonstrating symmetric enhancement. No nephrolithiasis, hydronephrosis or solid renal masses. Multiple low-density cystic lesions in LEFT kidney largest measuring 9 x 5.9 cm, unchanged. Stable 10 mm cyst or RIGHT interpolar kidney. Low-density cysts versus angiomyolipoma and lower pole  RIGHT kidney with additional too small to characterize hypodensities in the kidneys bilaterally. The unopacified ureters are normal in course and caliber. Delayed imaging through the kidneys demonstrates symmetric prompt contrast excretion within the proximal urinary collecting system. Urinary bladder is partially distended and unremarkable.  PERITONEUM/RETROPERITONEUM: No intraperitoneal free fluid nor free air. Aortoiliac vessels are normal in course and caliber, severe calcific atherosclerosis. No lymphadenopathy by CT size criteria. Internal reproductive organs are unremarkable.  SOFT TISSUE/OSSEOUS STRUCTURES: Nonsuspicious. Small fat containing inguinal hernias. Osteopenia. L4-5 and L5-S1 PLIF/posterior decompression with solid interbody fusion.  IMPRESSION: Colonic diverticulosis with superimposed rectosigmoid wall thickening, concerning for colitis (infectious or inflammatory), acute on chronic long segment of diverticulitis is less favored. No bowel perforation or obstruction.  Similar bilateral renal lesions.  Relatively stable LEFT adrenal mass.   Electronically Signed   By: Elon Alas   On: 03/18/2014 06:05   Dg Abd Acute W/chest  03/18/2014   CLINICAL DATA:  Generalized abdominal pain with bloody diarrhea  EXAM: ACUTE ABDOMEN SERIES (ABDOMEN 2 VIEW & CHEST 1 VIEW)  COMPARISON:  CT abdomen and pelvis March 18, 2014  FINDINGS: PA chest: There is mild bibasilar subsegmental atelectasis. Elsewhere lungs are clear. Heart size and pulmonary vascularity are normal. No adenopathy. Atherosclerotic change is noted in the aorta.  Spine and left lateral decubitus abdomen: Bowel gas pattern is unremarkable. No obstruction or free air. There is postoperative change in the lower lumbar spine. There is intravenous contrast within the left kidney. There is evidence of the distortion of the left collecting system due to multiple large cysts seen on CT earlier in the day. Contrast is also seen in the urinary  bladder.  IMPRESSION: Overall bowel gas pattern unremarkable. No obstruction or free air. Mild subsegmental atelectasis in lung bases. No lung edema or consolidation.   Electronically Signed   By: Lowella Grip M.D.   On: 03/18/2014 15:45   Assessment & Plan by Problem: Principal Problem:   Hematochezia Active Problems:   CIGARETTE SMOKER  Essential hypertension   CAD (coronary artery disease)   Abdominal pain, acute, left lower quadrant   GERD (gastroesophageal reflux disease)   Sinus tachycardia   Hypokalemia  Mr. Placencia is a 73 year old gentleman with a history of suspected diverticular bleed 08/15, hemorrhoids, diverticulosis, CAD, hypertension, and GERD who presents with two day duration of worsening LLQ abdominal pain and bloody diarrhea.  1. Hematochezia: Given acute onset of left lower quadrant pain and associated bloody diarrhea differential includes colitis, diverticular bleed, diverticulitis, ischemic colitis, hemorrhoids, or malignancy. CT imaging shows evidence of colonic diverticulosis with superimposed rectosigmoid wall thickening concerning for colitis as an acute on chronic long segment of diverticulitis is less favored. However, given patient's prior GI bleed was due to a suspected diverticular source (confirmed diverticulosis from colonoscopy in 2013), this suspicion still holds at this time. Diverticulitis considered given his symptoms and WBC of 12.8 but patient has been afebrile since onset of symptoms. FOBT is negative and hemorrhoids less likely given location of pain, ED physician did not any active bleeding from anal region, and patient mentioned that he has not hemorrhoidal bleeding. Malignancy also less likely as patient denies overt weight loss and no family history of colon cancer.  --continue IV flagyl and ciprofloxacin --continue IV fluids @ 75 mL/h --placed GI consult --NPO at midnight if patient needs scope  --f/u CBC AM to see if Hgb drops  --PO Protonix   --Zofran PRN nausea --Dilaudid 0.5 mg PRN pain   2. Sinus Tachycardia: Initial HR at first ED visit was 76 but has been in 100-110 since second ED visit. Likely due to volume loss from hematochezia and recurrent diarrhea and/or setting of infection.  --telemetry  --continue home metoprolol   3. Hypokalemia: 3.3 at first ED visit and return value was 3.0. Likely in the setting of GI losses via diarrhea.  --repeat BMP AM --continue IV fluids    4. Essential Hypertension: elevated at 159/81 on presentation likely in the setting of not taking his medications this morning  -continue home diltiazem and metoprolol  5. Peptic Ulcer Disease: stable --continue Protonix drip   6. CAD: left heart catheterization done in 2005 showed 30% left main stenosis, an extensively calcified LAD with moderate plaquing, and EF was 60%  --continue home pravastatin and metoprolol  7. GERD: stable, takes Prilosec  --continue Protonix   8. Headaches: patient has history of headaches  --Topamax PRN  9. Tobacco Use: ~60 pack per year history, smokes ~1 pack per day --counseled on cessation --nicotine patch PRN   This is a Careers information officer Note.  The care of the patient was discussed with Dr. Redmond Pulling and the assessment and plan was formulated with their assistance.  Please see their note for official documentation of the patient encounter.   Signed: Josiah Lobo, Med Student 03/18/2014, 7:57 PM

## 2014-03-18 NOTE — ED Notes (Signed)
Per EMS, patient seen here yesterday and diagnosed with colitis. Hx of diverticulitis and diverticulosis as well. Pt. Reports severe generalized abdominal pain, blooding diarrhea and urinary incontinence as well.

## 2014-03-18 NOTE — Discharge Instructions (Signed)
Followup with your Dr. next week. Return here at once for fever, vomiting, severe pain Colitis Colitis is inflammation of the colon. Colitis can be a short-term or long-standing (chronic) illness. Crohn's disease and ulcerative colitis are 2 types of colitis which are chronic. They usually require lifelong treatment. CAUSES  There are many different causes of colitis, including:  Viruses.  Germs (bacteria).  Medicine reactions. SYMPTOMS   Diarrhea.  Intestinal bleeding.  Pain.  Fever.  Throwing up (vomiting).  Tiredness (fatigue).  Weight loss.  Bowel blockage. DIAGNOSIS  The diagnosis of colitis is based on examination and stool or blood tests. X-rays, CT scan, and colonoscopy may also be needed. TREATMENT  Treatment may include:  Fluids given through the vein (intravenously).  Bowel rest (nothing to eat or drink for a period of time).  Medicine for pain and diarrhea.  Medicines (antibiotics) that kill germs.  Cortisone medicines.  Surgery. HOME CARE INSTRUCTIONS   Get plenty of rest.  Drink enough water and fluids to keep your urine clear or pale yellow.  Eat a well-balanced diet.  Call your caregiver for follow-up as recommended. SEEK IMMEDIATE MEDICAL CARE IF:   You develop chills.  You have an oral temperature above 102 F (38.9 C), not controlled by medicine.  You have extreme weakness, fainting, or dehydration.  You have repeated vomiting.  You develop severe belly (abdominal) pain or are passing bloody or tarry stools. MAKE SURE YOU:   Understand these instructions.  Will watch your condition.  Will get help right away if you are not doing well or get worse. Document Released: 07/02/2004 Document Revised: 08/17/2011 Document Reviewed: 09/27/2009 [facility] Patient Information 2015 [place], [place]. This information is not intended to replace advice given to you by your health care provider. Make sure you discuss any questions you have with  your health care provider.

## 2014-03-18 NOTE — ED Provider Notes (Addendum)
CSN: [id number]     Arrival date & time 03/18/14  1409 History   First MD Initiated Contact with Patient 03/18/14 1416     Chief Complaint  Patient presents with  . Abdominal Pain     (Consider location/radiation/quality/duration/timing/severity/associated sxs/prior Treatment) HPI Comments: Returns to the ER for increasing abdominal pain with bloody diarrhea. Patient was seen in ER for similar symptoms yesterday, was diagnosed with colitis by CT scan. Patient was started on Cipro and Flagyl and discharged. Patient reports since going home his diarrhea has worsened. He has been having increasing pain not controlled with hydrocodone. Patient continues to see blood in his stool.  Patient is a 73 y.o. male presenting with abdominal pain.  Abdominal Pain Associated symptoms: diarrhea     Past Medical History  Diagnosis Date  . Hyperlipidemia   . Hypertension   . GERD (gastroesophageal reflux disease)   . PUD (peptic ulcer disease) 2008    antral ulcer  . Esophageal stricture 2008    peptic stricture: dilated.   . Prostate cancer 11/2007  . ED (erectile dysfunction)   . Benign neoplasm of adrenal gland   . Peyronie's disease   . Depression   . Diverticulosis 2002  . Coronary artery disease 2005    2005 Cath with 30% LAD, calcification/plaque of LAD, EF 60%  . Palpitations   . Hemorrhoids 2002  . Arthritis     DDD/ DJD  . Cataract     "beginnings" per pt.  . History of kidney stones 2014    right ureteral lithotripsy 01/2013  . IBS (irritable bowel syndrome)    Past Surgical History  Procedure Laterality Date  . Spinal fusion  99,06  . Hernia repair    . Prostatectomy  12/2007    radical.  for prostate CA  . Tonsillectomy and adenoidectomy    . Nasal septum surgery     Family History  Problem Relation Age of Onset  . Colon cancer Maternal Aunt 68  . Heart attack Father 46  . Aneurysm Father   . Lymphoma Maternal Aunt   . Bone cancer Maternal Grandfather    History    Substance Use Topics  . Smoking status: Current Every Day Smoker -- 0.75 packs/day for 53 years    Types: Cigarettes  . Smokeless tobacco: Never Used  . Alcohol Use: No    Review of Systems  Gastrointestinal: Positive for abdominal pain, diarrhea and blood in stool.  All other systems reviewed and are negative.     Allergies  Etodolac; Sulfonamide derivatives; and Pseudoephedrine  Home Medications   Prior to Admission medications   Medication Sig Start Date End Date Taking? Authorizing Provider  ciprofloxacin (CIPRO) 500 MG tablet Take 1 tablet (500 mg total) by mouth 2 (two) times daily. 03/18/14   Leota Jacobsen, MD  dicyclomine (BENTYL) 10 MG capsule Take 1 capsule (10 mg total) by mouth 3 (three) times daily. 11/16/13   Ladene Artist, MD  diltiazem [facility]) 360 MG 24 hr capsule Take 360 mg by mouth daily.    Historical Provider, MD  HYDROcodone-acetaminophen (NORCO/VICODIN) 5-325 MG per tablet Take 1 tablet by mouth every 6 (six) hours as needed for moderate pain.    Historical Provider, MD  metoprolol succinate (TOPROL-XL) 25 MG 24 hr tablet Take 25 mg by mouth 2 (two) times daily.    Historical Provider, MD  metroNIDAZOLE (FLAGYL) 500 MG tablet Take 1 tablet (500 mg total) by mouth 2 (two) times daily. 03/18/14  Leota Jacobsen, MD  Multiple Vitamin (MULTIVITAMIN WITH MINERALS) TABS tablet Take 1 tablet by mouth daily.    Historical Provider, MD  omeprazole (PRILOSEC) 20 MG capsule Take 20 mg by mouth 2 (two) times daily.    Historical Provider, MD  oxyCODONE-acetaminophen (PERCOCET/ROXICET) 5-325 MG per tablet Take 1-2 tablets by mouth every 4 (four) hours as needed for moderate pain or severe pain. 03/18/14   Leota Jacobsen, MD  oxymetazoline (AFRIN) 0.05 % nasal spray Place 2 sprays into the nose at bedtime as needed for congestion.    Historical Provider, MD  pravastatin (PRAVACHOL) 40 MG tablet Take 1 tablet (40 mg total) by mouth every evening. 08/21/13   Larey Dresser, MD  Probiotic Product (PROBIOTIC DAILY PO) Take 1 tablet by mouth daily.    Historical Provider, MD  topiramate (TOPAMAX) 25 MG tablet Take 25 mg by mouth daily as needed (for headachs).    Historical Provider, MD  traZODone (DESYREL) 50 MG tablet Take 50 mg by mouth at bedtime as needed for sleep.    Historical Provider, MD   There were no vitals taken for this visit. Physical Exam  Constitutional: He is oriented to person, place, and time. He appears well-developed and well-nourished. No distress.  HENT:  Head: Normocephalic and atraumatic.  Right Ear: Hearing normal.  Left Ear: Hearing normal.  Nose: Nose normal.  Mouth/Throat: Oropharynx is clear and moist and mucous membranes are normal.  Eyes: Conjunctivae and EOM are normal. Pupils are equal, round, and reactive to light.  Neck: Normal range of motion. Neck supple.  Cardiovascular: Regular rhythm, S1 normal and S2 normal.  Exam reveals no gallop and no friction rub.   No murmur heard. Pulmonary/Chest: Effort normal and breath sounds normal. No respiratory distress. He exhibits no tenderness.  Abdominal: Soft. Normal appearance and bowel sounds are normal. There is no hepatosplenomegaly. There is tenderness in the suprapubic area and left lower quadrant. There is no rebound, no guarding, no tenderness at McBurney's point and negative Murphy's sign. No hernia.    Musculoskeletal: Normal range of motion.  Neurological: He is alert and oriented to person, place, and time. He has normal strength. No cranial nerve deficit or sensory deficit. Coordination normal. GCS eye subscore is 4. GCS verbal subscore is 5. GCS motor subscore is 6.  Skin: Skin is warm, dry and intact. No rash noted. No cyanosis.  Psychiatric: He has a normal mood and affect. His speech is normal and behavior is normal. Thought content normal.    ED Course  Procedures (including critical care time) Labs Review Labs Reviewed  CBC WITH DIFFERENTIAL    COMPREHENSIVE METABOLIC PANEL  LIPASE, BLOOD  URINALYSIS, ROUTINE W REFLEX MICROSCOPIC  I-STAT CG4 LACTIC ACID, ED    Imaging Review Ct Abdomen Pelvis W Contrast  03/18/2014   CLINICAL DATA:  Acute onset diarrhea 1 day ago, LEFT lower quadrant pain. History of diverticulitis and inflammatory bowel syndrome.  EXAM: CT ABDOMEN AND PELVIS WITH CONTRAST  TECHNIQUE: Multidetector CT imaging of the abdomen and pelvis was performed using the standard protocol following bolus administration of intravenous contrast.  CONTRAST:  100 cc Omnipaque 300.  COMPARISON:  CT of the abdomen and pelvis January 16, 2014 and CT of the abdomen and pelvis August 11, 2013  FINDINGS: LUNG BASES: Included view of the lung bases are clear. Visualized heart and pericardium are unremarkable.  SOLID ORGANS: 2.8 x 3.1 cm LEFT adrenal mass, relatively unchanged from prior imaging, 55  Hounsfield units on the initial phase, 31 Hounsfield units on delayed. The liver, spleen, pancreas, gallbladder and RIGHT adrenal gland are unremarkable.  GASTROINTESTINAL TRACT: Extensive colonic diverticulosis. Market sigmoid bowel wall thickening of with slight descending colonic pericolonic inflammation. The stomach, small bowel are normal in course and caliber without inflammatory changes. Normal appendix.  KIDNEYS/ URINARY TRACT: Kidneys are orthotopic, demonstrating symmetric enhancement. No nephrolithiasis, hydronephrosis or solid renal masses. Multiple low-density cystic lesions in LEFT kidney largest measuring 9 x 5.9 cm, unchanged. Stable 10 mm cyst or RIGHT interpolar kidney. Low-density cysts versus angiomyolipoma and lower pole RIGHT kidney with additional too small to characterize hypodensities in the kidneys bilaterally. The unopacified ureters are normal in course and caliber. Delayed imaging through the kidneys demonstrates symmetric prompt contrast excretion within the proximal urinary collecting system. Urinary bladder is partially distended  and unremarkable.  PERITONEUM/RETROPERITONEUM: No intraperitoneal free fluid nor free air. Aortoiliac vessels are normal in course and caliber, severe calcific atherosclerosis. No lymphadenopathy by CT size criteria. Internal reproductive organs are unremarkable.  SOFT TISSUE/OSSEOUS STRUCTURES: Nonsuspicious. Small fat containing inguinal hernias. Osteopenia. L4-5 and L5-S1 PLIF/posterior decompression with solid interbody fusion.  IMPRESSION: Colonic diverticulosis with superimposed rectosigmoid wall thickening, concerning for colitis (infectious or inflammatory), acute on chronic long segment of diverticulitis is less favored. No bowel perforation or obstruction.  Similar bilateral renal lesions.  Relatively stable LEFT adrenal mass.   Electronically Signed   By: Elon Alas   On: 03/18/2014 06:05     EKG Interpretation None      MDM   Final diagnoses:  None   Colitis  Patient presents to the ER for evaluation of worsening symptoms secondary to colitis. Patient was seen yesterday and diagnosed with colitis by CAT scan. She was initiated on oral treatment with Cipro and Flagyl, analgesia. Patient has had increased pain and diarrhea. Patient's hemoglobin is stable since yesterday. Patient will require hospitalization for further management of colitis, as she has not tolerated outpatient management.  Addendum: Discussed briefly with Doctor Fuller Plan, the patient's gastroenterologist. He agrees with current management. Will see the patient in the morning on rounds.  Orpah Greek, MD 03/18/14 Bishop, MD 03/18/14 1700

## 2014-03-18 NOTE — H&P (Signed)
Date: 03/18/2014               Patient Name:  [name] MRN: [id number]  DOB: [birth year] Age / Sex: 73 y.o., male   PCP: Rodena Medin, MD         Medical Service: Internal Medicine Teaching Service         Attending Physician: Dr. Michel Bickers, MD    First Contact: Josiah Lobo MS4 Pager: [phone]  Second Contact: Dr. Duwaine Maxin  Pager: [phone]       After Hours (After 5p/  First Contact Pager: [phone]  weekends / holidays): Second Contact Pager: [phone]   Chief Complaint: abdominal pain and bloody diarrhea  History of Present Illness:  [name] is a 73 year old gentleman with a history of diverticular bleed 08/15, hemorrhoids, diverticulosis, hypertension, GERD, CAD, IBS, prostate cancer s/p resection in 11/2007 who presents with LLQ abdominal pain and bloody diarrhea since for three days.  He describes the pain as 10/10.  He reports constant diarrhea with blood in the stool.  He says he has not had diarrhea while in the ED.   He has had some nausea but denies vomiting, fevers, night sweats, or chills. He also denies taking aspirin or NSAIDs since his last diverticular bleed.   Given the symptoms, he initially presented to the ED on 10/11 at 1:00 a.m. and CT scan had revealed colitis and patient was discharged on Cipro and Flagyl. After he returned home at 6 a.m., he took Vicodin and slept and then had another bloody bowel movement along with pain and returned later that day at 2:00 p.m.  [facility] was asked to admit for further management and pain control.   Meds: Current Facility-Administered Medications  Medication Dose Route Frequency Provider Last Rate Last Dose  . ciprofloxacin (CIPRO) IVPB 400 mg  400 mg Intravenous Q12H Orpah Greek, MD 200 mL/hr at 03/18/14 1556 400 mg at 03/18/14 1556  . metroNIDAZOLE (FLAGYL) IVPB 500 mg  500 mg Intravenous Q6H Orpah Greek, MD       Current Outpatient Prescriptions  Medication Sig Dispense Refill  .  HYDROcodone-acetaminophen (NORCO/VICODIN) 5-325 MG per tablet Take 1 tablet by mouth every 6 (six) hours as needed for moderate pain.      Marland Kitchen oxymetazoline (AFRIN) 0.05 % nasal spray Place 2 sprays into the nose at bedtime as needed for congestion.      . topiramate (TOPAMAX) 25 MG tablet Take 25 mg by mouth daily as needed (for headaches).       . traZODone (DESYREL) 50 MG tablet Take 50 mg by mouth at bedtime as needed for sleep.      . ciprofloxacin (CIPRO) 500 MG tablet Take 1 tablet (500 mg total) by mouth 2 (two) times daily.  20 tablet  0  . dicyclomine (BENTYL) 10 MG capsule Take 1 capsule (10 mg total) by mouth 3 (three) times daily.  270 capsule  3  . diltiazem (TIAZAC) 360 MG 24 hr capsule Take 360 mg by mouth daily.      . metoprolol succinate (TOPROL-XL) 25 MG 24 hr tablet Take 25 mg by mouth 2 (two) times daily.      . metroNIDAZOLE (FLAGYL) 500 MG tablet Take 1 tablet (500 mg total) by mouth 2 (two) times daily.  20 tablet  0  . Multiple Vitamin (MULTIVITAMIN WITH MINERALS) TABS tablet Take 1 tablet by mouth daily.      Marland Kitchen omeprazole (PRILOSEC) 20 MG capsule  Take 20 mg by mouth 2 (two) times daily.      Marland Kitchen oxyCODONE-acetaminophen (PERCOCET/ROXICET) 5-325 MG per tablet Take 1-2 tablets by mouth every 4 (four) hours as needed for moderate pain or severe pain.  15 tablet  0  . pravastatin (PRAVACHOL) 40 MG tablet Take 1 tablet (40 mg total) by mouth every evening.  90 tablet  3  . Probiotic Product (PROBIOTIC DAILY PO) Take 1 tablet by mouth daily.        Allergies: Allergies as of 03/18/2014 - Review Complete 03/18/2014  Allergen Reaction Noted  . Etodolac  01/19/2007  . Sulfonamide derivatives Itching   . Pseudoephedrine Palpitations 01/19/2007   Past Medical History  Diagnosis Date  . Hyperlipidemia   . Hypertension   . GERD (gastroesophageal reflux disease)   . PUD (peptic ulcer disease) 2008    antral ulcer  . Esophageal stricture 2008    peptic stricture: dilated.   .  Prostate cancer 11/2007  . ED (erectile dysfunction)   . Benign neoplasm of adrenal gland   . Peyronie's disease   . Depression   . Diverticulosis 2002  . Coronary artery disease 2005    2005 Cath with 30% LAD, calcification/plaque of LAD, EF 60%  . Palpitations   . Hemorrhoids 2002  . Arthritis     DDD/ DJD  . Cataract     "beginnings" per pt.  . History of kidney stones 2014    right ureteral lithotripsy 01/2013  . IBS (irritable bowel syndrome)    Past Surgical History  Procedure Laterality Date  . Spinal fusion  99,06  . Hernia repair    . Prostatectomy  12/2007    radical.  for prostate CA  . Tonsillectomy and adenoidectomy    . Nasal septum surgery     Family History  Problem Relation Age of Onset  . Colon cancer Maternal Aunt 33  . Heart attack Father 58  . Aneurysm Father   . Lymphoma Maternal Aunt   . Bone cancer Maternal Grandfather    History   Social History  . Marital Status: Married    Spouse Name: N/A    Number of Children: 2  . Years of Education: N/A   Occupational History  . Retired    Social History Main Topics  . Smoking status: Current Every Day Smoker -- 0.75 packs/day for 53 years    Types: Cigarettes  . Smokeless tobacco: Never Used  . Alcohol Use: No  . Drug Use: No  . Sexual Activity: Yes   Other Topics Concern  . Not on file   Social History Narrative  . No narrative on file    Review of Systems: Pertinent items are noted in HPI.  Physical Exam: Blood pressure 159/81, pulse 108, resp. rate 24, SpO2 95.00%. General: resting in bed in NAD Cardiac: tachycardic, regular rhythm, no rubs, murmurs or gallops Pulm: clear to auscultation bilaterally, moving normal volumes of air Abd: soft, nondistended, hyperactive BS present, LLQ TTP, no guarding/rebound/rigidity Ext: warm and well perfused, no pedal edema Neuro: alert and oriented X3, able to move extremities, responding appropriately  Lab results: Basic Metabolic  Panel:  Recent Labs  03/18/14 0158 03/18/14 1450  NA 142 141  K 3.3* 3.0*  CL 105 105  CO2 22 24  GLUCOSE 122* 113*  BUN 16 13  CREATININE 0.74 0.67  CALCIUM 9.1 9.1   Liver Function Tests:  Recent Labs  03/18/14 0158 03/18/14 1450  AST 16 15  ALT 18 16  ALKPHOS 95 94  BILITOT 0.3 0.4  PROT 7.3 7.0  ALBUMIN 3.7 3.5    Recent Labs  03/18/14 0158 03/18/14 1450  LIPASE 17 12   CBC:  Recent Labs  03/18/14 0158 03/18/14 1450  WBC 13.7* 13.7*  NEUTROABS 10.5* 12.3*  HGB 13.1 12.8*  HCT 39.1 38.6*  MCV 90.1 90.2  PLT 328 299   Imaging results:  Ct Abdomen Pelvis W Contrast  03/18/2014   CLINICAL DATA:  Acute onset diarrhea 1 day ago, LEFT lower quadrant pain. History of diverticulitis and inflammatory bowel syndrome.  EXAM: CT ABDOMEN AND PELVIS WITH CONTRAST  TECHNIQUE: Multidetector CT imaging of the abdomen and pelvis was performed using the standard protocol following bolus administration of intravenous contrast.  CONTRAST:  100 cc Omnipaque 300.  COMPARISON:  CT of the abdomen and pelvis January 16, 2014 and CT of the abdomen and pelvis August 11, 2013  FINDINGS: LUNG BASES: Included view of the lung bases are clear. Visualized heart and pericardium are unremarkable.  SOLID ORGANS: 2.8 x 3.1 cm LEFT adrenal mass, relatively unchanged from prior imaging, 55 Hounsfield units on the initial phase, 31 Hounsfield units on delayed. The liver, spleen, pancreas, gallbladder and RIGHT adrenal gland are unremarkable.  GASTROINTESTINAL TRACT: Extensive colonic diverticulosis. Market sigmoid bowel wall thickening of with slight descending colonic pericolonic inflammation. The stomach, small bowel are normal in course and caliber without inflammatory changes. Normal appendix.  KIDNEYS/ URINARY TRACT: Kidneys are orthotopic, demonstrating symmetric enhancement. No nephrolithiasis, hydronephrosis or solid renal masses. Multiple low-density cystic lesions in LEFT kidney largest  measuring 9 x 5.9 cm, unchanged. Stable 10 mm cyst or RIGHT interpolar kidney. Low-density cysts versus angiomyolipoma and lower pole RIGHT kidney with additional too small to characterize hypodensities in the kidneys bilaterally. The unopacified ureters are normal in course and caliber. Delayed imaging through the kidneys demonstrates symmetric prompt contrast excretion within the proximal urinary collecting system. Urinary bladder is partially distended and unremarkable.  PERITONEUM/RETROPERITONEUM: No intraperitoneal free fluid nor free air. Aortoiliac vessels are normal in course and caliber, severe calcific atherosclerosis. No lymphadenopathy by CT size criteria. Internal reproductive organs are unremarkable.  SOFT TISSUE/OSSEOUS STRUCTURES: Nonsuspicious. Small fat containing inguinal hernias. Osteopenia. L4-5 and L5-S1 PLIF/posterior decompression with solid interbody fusion.  IMPRESSION: Colonic diverticulosis with superimposed rectosigmoid wall thickening, concerning for colitis (infectious or inflammatory), acute on chronic long segment of diverticulitis is less favored. No bowel perforation or obstruction.  Similar bilateral renal lesions.  Relatively stable LEFT adrenal mass.   Electronically Signed   By: Elon Alas   On: 03/18/2014 06:05   Dg Abd Acute W/chest  03/18/2014   CLINICAL DATA:  Generalized abdominal pain with bloody diarrhea  EXAM: ACUTE ABDOMEN SERIES (ABDOMEN 2 VIEW & CHEST 1 VIEW)  COMPARISON:  CT abdomen and pelvis March 18, 2014  FINDINGS: PA chest: There is mild bibasilar subsegmental atelectasis. Elsewhere lungs are clear. Heart size and pulmonary vascularity are normal. No adenopathy. Atherosclerotic change is noted in the aorta.  Spine and left lateral decubitus abdomen: Bowel gas pattern is unremarkable. No obstruction or free air. There is postoperative change in the lower lumbar spine. There is intravenous contrast within the left kidney. There is evidence of the  distortion of the left collecting system due to multiple large cysts seen on CT earlier in the day. Contrast is also seen in the urinary bladder.  IMPRESSION: Overall bowel gas pattern unremarkable. No obstruction or free air. Mild subsegmental  atelectasis in lung bases. No lung edema or consolidation.   Electronically Signed   By: Lowella Grip M.D.   On: 03/18/2014 15:45   Assessment & Plan by Problem: 1. Hematochezia: Given acute onset of left lower quadrant pain and associated bloody diarrhea differential includes colitis, diverticular bleed, diverticulitis, ischemic colitis, or hemorrhoidal bleed. CT imaging shows evidence of colonic diverticulosis with superimposed rectosigmoid wall thickening concerning for colitis as an acute on chronic long segment of diverticulitis is less favored. He has diverticulosis on prior colonoscopy and recent hospitalization for similar symptoms though to be due to diverticular bleed.  This could be diverticulitis given pain, leukocytosis, though he has been afebrile.  Doubt ischemia given normal lactic acid and no pain out of proportion to exam.  Hemorrhoid bleed less likely since the patient is describing diarrhea and blood not just blood on toilet paper or small drops of blood.  FOBT is negative and the patient has not had diarrhea or blood per rectum in several hours so the source of bleeding has likely stopped.  His hgb is stable at 12.8. -continue IV flagyl and ciprofloxacin for now -continue IV fluids @ 75 mL/h  -GI consulted by EDP; he follow with Dr. Fuller Plan  -NPO at midnight if patient needs scope  -f/u CBC AM to see if Hgb drops  -PO Protonix  -Zofran PRN nausea  -Dilaudid 0.5 mg PRN pain   2. Sinus Tachycardia: Initial HR at first ED visit was 76 but has been in 100-110 since second ED visit. Likely due to volume loss from hematochezia and recurrent diarrhea and/or setting of infection.  - close monitoring in SDU  - fluids - continue home metoprolol  and diltiazem  3. Hypokalemia: 3.3 at first ED visit and return value was 3.0. Likely related to diarrhea. - Kdur - repeat BMP AM; supplement prn - continue IV fluids   4. Essential Hypertension: elevated at 159/81 on presentation likely in the setting of not taking his medications this morning.  -continue home diltiazem and metoprolol   5. Peptic Ulcer Disease: stable  --continue Protonix   6. CAD: left heart catheterization done in 2005 showed 30% left main stenosis, an extensively calcified LAD with moderate plaquing, and EF was 60%  --continue home pravastatin and metoprolol   7. GERD: stable, takes Prilosec  --continue Protonix   8. Headaches: patient has history of headaches  --Topamax PRN   9. Tobacco Use: ~60 pack per year history, smokes ~1 pack per day  --counseled on cessation  --nicotine patch PRN   VTE ppx: SCDs; no pharmacologic VTE ppx given recent bleed Diet:  NPO past midnight for possible GI intervention Code:  Full  Dispo: Disposition is deferred at this time, awaiting improvement of current medical problems. Anticipated discharge in approximately 1-2 day(s).   The patient does have a current PCP Rodena Medin, MD) and does not need an [facility] hospital follow-up appointment after discharge.  The patient does not know have transportation limitations that hinder transportation to clinic appointments.  Signed: Francesca Oman, DO 03/18/2014, 4:51 PM

## 2014-03-18 NOTE — H&P (Signed)
  I have seen and examined the patient myself, and I have reviewed the note by Josiah Lobo, MS4 and was present during the interview and physical exam.  Please see my separate H&P for additional findings, assessment, and plan.   Signed: Francesca Oman, DO 03/18/2014, 10:28 PM

## 2014-03-18 NOTE — H&P (Deleted)
Date: 03/18/2014               Patient Name:  [name] MRN: [id number]  DOB: 1940/09/25 Age / Sex: 73 y.o., male   PCP: Rodena Medin, MD              Medical Service: Internal Medicine Teaching Service              Attending Physician: Dr. Michel Bickers, MD    First Contact: Josiah Lobo, MS4 Pager: [phone]  Second Contact: Dr. Duwaine Maxin, DO  Pager: [phone]            After Hours (After 5p/  First Contact Pager: [phone]  weekends / holidays): Second Contact Pager: [phone]   Chief Complaint: abdominal pain and bloody diarrhea   History of Present Illness: Mr. Smolinsky is a 73 year old gentleman with a history of diverticular bleed 08/15, hemorrhoids, diverticulosis, hypertension, GERD, CAD, IBS, prostate cancer s/p resection in 11/2007 who presents with LLQ abdominal pain and bloody diarrhea since Friday evening. Patient describes having intermittent pain, rating at 10/10, since onset throughout all of Saturday and up to presentation at the ED on Sunday early morning. Since Friday evening, he has had diarrhea three times throughout the night as he was working. On Saturday, he cannot recall the number of times but notes as "multiple." Every bowel movement resulted in bright blood mixed with stool and "mucus." He has had nausea and subsequent hyperventilation but denies vomiting. Patient estimates that he lost about 0.5 cup of blood overall. He also denies any fevers, night sweats, or chills. He also denies taking aspirin or NSAIDs since his last diverticular bleed.   Given the symptoms, he initially presented to the ED on 10/11 at 1:00 a.m. and CT scan had revealed colitis and patient was discharged on Cipro and Flagyl. After he returned home at 6 a.m., he took Vicodin and slept and then had another bloody bowel movement along with pain and returned later that day at 2:00 p.m. This time, he was started in IV Cipro, Flagyl, fluids, and given 0.5 mg Dilaudid. Hemoglobin at first ED  visit was 13.1 and was 12.8 at return visit.  He did not have time to fill his prescribed antibiotic and did not take of his prescribed medications today. He has been able to eat on Saturday but today has had no appetite and has not taken any of his medications. He denies any drastic weight loss. He denies chest pain, shortness of breath, fatigue, neurological symptoms, or myalgias. He does think this presentation is not as intense as the time he had his diverticular bleed and not due to his hemorrhoids. Patient has not had a bowel movement since this morning.   Meds: Current Facility-Administered Medications  Medication Dose Route Frequency Provider Last Rate Last Dose  . ciprofloxacin (CIPRO) IVPB 400 mg  400 mg Intravenous Q12H Orpah Greek, MD   400 mg at 03/18/14 1556  . metroNIDAZOLE (FLAGYL) IVPB 500 mg  500 mg Intravenous Q6H Orpah Greek, MD 100 mL/hr at 03/18/14 1700 500 mg at 03/18/14 1700    Allergies: Allergies as of 03/18/2014 - Review Complete 03/18/2014  Allergen Reaction Noted  . Etodolac  01/19/2007  . Sulfonamide derivatives Itching   . Pseudoephedrine Palpitations 01/19/2007   Past Medical History  Diagnosis Date  . Hyperlipidemia   . Hypertension   . GERD (gastroesophageal reflux disease)   . PUD (peptic ulcer disease) 2008    antral  ulcer  . Esophageal stricture 2008    peptic stricture: dilated.   . Prostate cancer 11/2007  . ED (erectile dysfunction)   . Benign neoplasm of adrenal gland   . Peyronie's disease   . Depression   . Diverticulosis 2002  . Coronary artery disease 2005    2005 Cath with 30% LAD, calcification/plaque of LAD, EF 60%  . Palpitations   . Hemorrhoids 2002  . Arthritis     DDD/ DJD  . Cataract     "beginnings" per pt.  . History of kidney stones 2014    right ureteral lithotripsy 01/2013  . IBS (irritable bowel syndrome)    Past Surgical History  Procedure Laterality Date  . Spinal fusion  99,06  . Hernia  repair    . Prostatectomy  12/2007    radical.  for prostate CA  . Tonsillectomy and adenoidectomy    . Nasal septum surgery     Family History  Problem Relation Age of Onset  . Colon cancer Maternal Aunt 66  . Heart attack Father 41  . Aneurysm Father   . Lymphoma Maternal Aunt   . Bone cancer Maternal Grandfather    History   Social History  . Marital Status: Married    Spouse Name: N/A    Number of Children: 2  . Years of Education: N/A   Occupational History  . Retired    Social History Main Topics  . Smoking status: Current Every Day Smoker -- 0.75 packs/day for 53 years    Types: Cigarettes  . Smokeless tobacco: Never Used  . Alcohol Use: No  . Drug Use: No  . Sexual Activity: Yes   Other Topics Concern  . Not on file   Social History Narrative  . No narrative on file    Review of Systems: Pertinent items are noted in HPI.  Physical Exam: Blood pressure 159/81, pulse 108, resp. rate 24, SpO2 95.00%. BP 159/81  Pulse 108  Resp 24  SpO2 95%  General Appearance:    Alert, cooperative, no distress  Back:     Symmetric, no curvature, ROM normal, no CVA tenderness  Lungs:     Clear to auscultation bilaterally, respirations unlabored  Chest wall:    No tenderness or deformity  Heart:    Increased rate and rhythm, S1 and S2 normal, no murmur,   rub or gallop  Abdomen:     Soft, nondistended, no organomegaly. Hyperactive bowel   sounds. Tender to palpation in LLQ. Some guarding.  Extremities:   Extremities normal, atraumatic, no cyanosis or edema  Pulses:   2+ and symmetric all extremities  Skin:   Skin color, texture, turgor normal, no rashes or lesions  Lymph nodes:   Cervical, supraclavicular, and axillary nodes normal    Lab results: Basic Metabolic Panel:  Recent Labs  03/18/14 0158 03/18/14 1450  NA 142 141  K 3.3* 3.0*  CL 105 105  CO2 22 24  GLUCOSE 122* 113*  BUN 16 13  CREATININE 0.74 0.67  CALCIUM 9.1 9.1   Liver Function  Tests:  Recent Labs  03/18/14 0158 03/18/14 1450  AST 16 15  ALT 18 16  ALKPHOS 95 94  BILITOT 0.3 0.4  PROT 7.3 7.0  ALBUMIN 3.7 3.5    Recent Labs  03/18/14 0158 03/18/14 1450  LIPASE 17 12   No results found for this basename: AMMONIA,  in the last 72 hours CBC:  Recent Labs  03/18/14 0158 03/18/14 1450  WBC 13.7* 13.7*  NEUTROABS 10.5* 12.3*  HGB 13.1 12.8*  HCT 39.1 38.6*  MCV 90.1 90.2  PLT 328 299   Imaging results:  Ct Abdomen Pelvis W Contrast  03/18/2014   CLINICAL DATA:  Acute onset diarrhea 1 day ago, LEFT lower quadrant pain. History of diverticulitis and inflammatory bowel syndrome.  EXAM: CT ABDOMEN AND PELVIS WITH CONTRAST  TECHNIQUE: Multidetector CT imaging of the abdomen and pelvis was performed using the standard protocol following bolus administration of intravenous contrast.  CONTRAST:  100 cc Omnipaque 300.  COMPARISON:  CT of the abdomen and pelvis January 16, 2014 and CT of the abdomen and pelvis August 11, 2013  FINDINGS: LUNG BASES: Included view of the lung bases are clear. Visualized heart and pericardium are unremarkable.  SOLID ORGANS: 2.8 x 3.1 cm LEFT adrenal mass, relatively unchanged from prior imaging, 55 Hounsfield units on the initial phase, 31 Hounsfield units on delayed. The liver, spleen, pancreas, gallbladder and RIGHT adrenal gland are unremarkable.  GASTROINTESTINAL TRACT: Extensive colonic diverticulosis. Market sigmoid bowel wall thickening of with slight descending colonic pericolonic inflammation. The stomach, small bowel are normal in course and caliber without inflammatory changes. Normal appendix.  KIDNEYS/ URINARY TRACT: Kidneys are orthotopic, demonstrating symmetric enhancement. No nephrolithiasis, hydronephrosis or solid renal masses. Multiple low-density cystic lesions in LEFT kidney largest measuring 9 x 5.9 cm, unchanged. Stable 10 mm cyst or RIGHT interpolar kidney. Low-density cysts versus angiomyolipoma and lower pole  RIGHT kidney with additional too small to characterize hypodensities in the kidneys bilaterally. The unopacified ureters are normal in course and caliber. Delayed imaging through the kidneys demonstrates symmetric prompt contrast excretion within the proximal urinary collecting system. Urinary bladder is partially distended and unremarkable.  PERITONEUM/RETROPERITONEUM: No intraperitoneal free fluid nor free air. Aortoiliac vessels are normal in course and caliber, severe calcific atherosclerosis. No lymphadenopathy by CT size criteria. Internal reproductive organs are unremarkable.  SOFT TISSUE/OSSEOUS STRUCTURES: Nonsuspicious. Small fat containing inguinal hernias. Osteopenia. L4-5 and L5-S1 PLIF/posterior decompression with solid interbody fusion.  IMPRESSION: Colonic diverticulosis with superimposed rectosigmoid wall thickening, concerning for colitis (infectious or inflammatory), acute on chronic long segment of diverticulitis is less favored. No bowel perforation or obstruction.  Similar bilateral renal lesions.  Relatively stable LEFT adrenal mass.   Electronically Signed   By: Elon Alas   On: 03/18/2014 06:05   Dg Abd Acute W/chest  03/18/2014   CLINICAL DATA:  Generalized abdominal pain with bloody diarrhea  EXAM: ACUTE ABDOMEN SERIES (ABDOMEN 2 VIEW & CHEST 1 VIEW)  COMPARISON:  CT abdomen and pelvis March 18, 2014  FINDINGS: PA chest: There is mild bibasilar subsegmental atelectasis. Elsewhere lungs are clear. Heart size and pulmonary vascularity are normal. No adenopathy. Atherosclerotic change is noted in the aorta.  Spine and left lateral decubitus abdomen: Bowel gas pattern is unremarkable. No obstruction or free air. There is postoperative change in the lower lumbar spine. There is intravenous contrast within the left kidney. There is evidence of the distortion of the left collecting system due to multiple large cysts seen on CT earlier in the day. Contrast is also seen in the urinary  bladder.  IMPRESSION: Overall bowel gas pattern unremarkable. No obstruction or free air. Mild subsegmental atelectasis in lung bases. No lung edema or consolidation.   Electronically Signed   By: Lowella Grip M.D.   On: 03/18/2014 15:45   Assessment & Plan by Problem: Principal Problem:   Hematochezia Active Problems:   CIGARETTE SMOKER  Essential hypertension   CAD (coronary artery disease)   Abdominal pain, acute, left lower quadrant   GERD (gastroesophageal reflux disease)   Sinus tachycardia   Hypokalemia  Mr. Klem is a 73 year old gentleman with a history of suspected diverticular bleed 08/15, hemorrhoids, diverticulosis, CAD, hypertension, and GERD who presents with two day duration of worsening LLQ abdominal pain and bloody diarrhea.  1. Hematochezia: Given acute onset of left lower quadrant pain and associated bloody diarrhea differential includes colitis, diverticular bleed, diverticulitis, ischemic colitis, hemorrhoids, or malignancy. CT imaging shows evidence of colonic diverticulosis with superimposed rectosigmoid wall thickening concerning for colitis as an acute on chronic long segment of diverticulitis is less favored. However, given patient's prior GI bleed was due to a suspected diverticular source (confirmed diverticulosis from colonoscopy in 2013), this suspicion still holds at this time. Diverticulitis considered given his symptoms and WBC of 13.7 but patient has been afebrile since onset of symptoms. 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 FOBT is negative and hemorrhoids less likely given location of pain, ED physician did not any active bleeding from anal region, and patient mentioned that he has not hemorrhoidal bleeding. Malignancy also less likely as patient denies overt weight loss and although patient's maternal aunt has colon cancer.  --continue IV flagyl and  ciprofloxacin --continue IV fluids @ 75 mL/h --placed GI consult --NPO at midnight if patient needs scope  --f/u CBC AM to see if Hgb drops  --PO Protonix  --Zofran PRN nausea --Dilaudid 0.5 mg PRN pain   2. Sinus Tachycardia: Initial HR at first ED visit was 76 but has been in 100-110 since second ED visit. Likely due to volume loss from hematochezia and recurrent diarrhea and/or setting of infection.  --telemetry  --continue home metoprolol   3. Hypokalemia: 3.3 at first ED visit and return value was 3.0. Likely in the setting of GI losses via diarrhea.  --repeat BMP AM --continue IV fluids    4. Essential Hypertension: elevated at 159/81 on presentation likely in the setting of not taking his medications this morning  -continue home diltiazem and metoprolol  5. Peptic Ulcer Disease: stable --continue Protonix drip   6. CAD: left heart catheterization done in 2005 showed 30% left main stenosis, an extensively calcified LAD with moderate plaquing, and EF was 60%  --continue home pravastatin and metoprolol  7. GERD: stable, takes Prilosec  --continue Protonix   8. Headaches: patient has history of headaches  --Topamax PRN  9. Tobacco Use: ~60 pack per year history, smokes ~1 pack per day --counseled on cessation --nicotine patch PRN   This is a Careers information officer Note.  The care of the patient was discussed with Dr. Redmond Pulling and the assessment and plan was formulated with their assistance.  Please see their note for official documentation of the patient encounter.   Signed: Josiah Lobo, Med Student 03/18/2014, 7:57 PM

## 2014-03-19 ENCOUNTER — Encounter (HOSPITAL_COMMUNITY): Payer: Self-pay | Admitting: Gastroenterology

## 2014-03-19 ENCOUNTER — Encounter (HOSPITAL_COMMUNITY)
Admission: EM | Disposition: A | Discharge status: Home / Self Care | Payer: Self-pay | Source: Home / Self Care | Attending: Internal Medicine

## 2014-03-19 DIAGNOSIS — R1032 Left lower quadrant pain: Secondary | ICD-10-CM

## 2014-03-19 DIAGNOSIS — K8050 Calculus of bile duct without cholangitis or cholecystitis without obstruction: Secondary | ICD-10-CM

## 2014-03-19 HISTORY — PX: FLEXIBLE SIGMOIDOSCOPY: SHX5431

## 2014-03-19 LAB — URINALYSIS, ROUTINE W REFLEX MICROSCOPIC
Bilirubin Urine: NEGATIVE
GLUCOSE, UA: NEGATIVE mg/dL
HGB URINE DIPSTICK: NEGATIVE
KETONES UR: 15 mg/dL — AB
Leukocytes, UA: NEGATIVE
Nitrite: NEGATIVE
PROTEIN: NEGATIVE mg/dL
Specific Gravity, Urine: 1.039 — ABNORMAL HIGH (ref 1.005–1.030)
UROBILINOGEN UA: 0.2 mg/dL (ref 0.0–1.0)
pH: 5 (ref 5.0–8.0)

## 2014-03-19 LAB — COMPREHENSIVE METABOLIC PANEL
ALT: 17 U/L (ref 0–53)
ANION GAP: 12 (ref 5–15)
AST: 22 U/L (ref 0–37)
Albumin: 3.1 g/dL — ABNORMAL LOW (ref 3.5–5.2)
Alkaline Phosphatase: 79 U/L (ref 39–117)
BILIRUBIN TOTAL: 0.5 mg/dL (ref 0.3–1.2)
BUN: 9 mg/dL (ref 6–23)
CALCIUM: 8.9 mg/dL (ref 8.4–10.5)
CHLORIDE: 109 meq/L (ref 96–112)
CO2: 22 meq/L (ref 19–32)
CREATININE: 0.73 mg/dL (ref 0.50–1.35)
GFR calc Af Amer: >90 mL/min (ref >90)
GFR, EST NON AFRICAN AMERICAN: 89 mL/min — AB (ref >90)
Glucose, Bld: 107 mg/dL — ABNORMAL HIGH (ref 70–99)
Potassium: 3.9 mEq/L (ref 3.7–5.3)
Sodium: 143 mEq/L (ref 137–147)
Total Protein: 6.3 g/dL (ref 6.0–8.3)

## 2014-03-19 LAB — CBC
HCT: 36 % — ABNORMAL LOW (ref 39.0–52.0)
Hemoglobin: 11.8 g/dL — ABNORMAL LOW (ref 13.0–17.0)
MCH: 29.9 pg (ref 26.0–34.0)
MCHC: 32.8 g/dL (ref 30.0–36.0)
MCV: 91.4 fL (ref 78.0–100.0)
Platelets: 276 10*3/uL (ref 150–400)
RBC: 3.94 MIL/uL — AB (ref 4.22–5.81)
RDW: 13.3 % (ref 11.5–15.5)
WBC: 8.7 10*3/uL (ref 4.0–10.5)

## 2014-03-19 SURGERY — SIGMOIDOSCOPY, FLEXIBLE
Anesthesia: Moderate Sedation

## 2014-03-19 MED ORDER — MIDAZOLAM HCL 5 MG/ML IJ SOLN
INTRAMUSCULAR | Status: AC
  Filled 2014-03-19: qty 2

## 2014-03-19 MED ORDER — FENTANYL CITRATE 0.05 MG/ML IJ SOLN
INTRAMUSCULAR | Status: AC
  Filled 2014-03-19: qty 2

## 2014-03-19 MED ORDER — FENTANYL CITRATE 0.05 MG/ML IJ SOLN
INTRAMUSCULAR | Status: DC | PRN
  Administered 2014-03-19: 25 ug via INTRAVENOUS

## 2014-03-19 MED ORDER — DIPHENHYDRAMINE HCL 50 MG/ML IJ SOLN
INTRAMUSCULAR | Status: AC
  Filled 2014-03-19: qty 1

## 2014-03-19 MED ORDER — INFLUENZA VAC SPLIT QUAD 0.5 ML IM SUSY
0.5000 mL | PREFILLED_SYRINGE | INTRAMUSCULAR | Status: AC
  Administered 2014-03-20: 0.5 mL via INTRAMUSCULAR
  Filled 2014-03-19: qty 0.5

## 2014-03-19 MED ORDER — MIDAZOLAM HCL 10 MG/2ML IJ SOLN
INTRAMUSCULAR | Status: DC | PRN
  Administered 2014-03-19: 2 mg via INTRAVENOUS
  Administered 2014-03-19: 1 mg via INTRAVENOUS

## 2014-03-19 MED ORDER — SODIUM CHLORIDE 0.9 % IV SOLN: INTRAVENOUS | Status: DC

## 2014-03-19 NOTE — Progress Notes (Signed)
Paucity of findings on sigmoidoscopy.  There were scattered diverticula.  There was no colitis.  At best, there may have been mild erythema.  Biopsies were taken.  Recommendations #1 continue broad-spectrum antibiotics while awaiting biopsy results

## 2014-03-19 NOTE — Op Note (Signed)
[facility] [place], [id number]   FLEX SIGMOIDOSCOPY PROCEDURE REPORT  PATIENT: Hunter, Mccarthy  MR#: [id number] BIRTHDATE: [birth year] , 67  yrs. old GENDER: male ENDOSCOPIST: Inda Castle, MD REFERRED GU:RKYH Megan Salon, M.D. PROCEDURE DATE:  March 28, 2014 PROCEDURE:   Sigmoidoscopy with biopsy INDICATIONS:an abnormal CT.  demonstrating marked thickening of the left colon MEDICATIONS: Versed 3 mg IV and Fentanyl 25 mcg IV  DESCRIPTION OF PROCEDURE:    Physical exam was performed.  Informed consent was obtained from the patient after explaining the benefits, risks, and alternatives to procedure.  The patient was connected to monitor and placed in left lateral position. Continuous oxygen was provided by nasal cannula and IV medicine administered through an indwelling cannula.  After administration of sedation and rectal exam, the patients rectum was intubated and the EC-[id number] ([id number])  colonoscope was advanced under direct visualization to the cecum.  The scope was removed slowly by carefully examining the color, texture, anatomy, and integrity mucosa on the way out.  The patient was recovered in endoscopy and discharged home in satisfactory condition.       COLON FINDINGS: There was moderate diverticulosis noted in the sigmoid colon and descending colon and Mucosa looked basically normal.  There was perhaps questionable faint erythema.  No gross abnormalities were seen.  Biopsies were taken.  PREP QUALITY:  COMPLICATIONS: None  ENDOSCOPIC IMPRESSION: 1.   Moderate diverticulosis was noted in the sigmoid colon and descending colon 2.   Mucosa looked basically normal.  There was perhaps questionable faint erythema.  No gross abnormalities were seen.  Biopsies were taken  RECOMMENDATIONS: 1.  Await biopsy results 2.  Continue current meds      _______________________________ eSigned:  Inda Castle, MD March 28, 2014 2:13  PM   CPT CODES: ICD CODES:  The ICD and CPT codes recommended by this software are interpretations from the data that the clinical staff has captured with the software.  The verification of the translation of this report to the ICD and CPT codes and modifiers is the sole responsibility of the health care institution and practicing physician where this report was generated.  [employer]. will not be held responsible for the validity of the ICD and CPT codes included on this report.  AMA assumes no liability for data contained or not contained herein. CPT is a Designer, television/film set of the [employer].   PATIENT NAME:  Hunter, Mccarthy MR#: [id number]

## 2014-03-19 NOTE — Progress Notes (Signed)
Patient ID: [name], male   DOB: 1941-02-07, 74 y.o.   MRN: [id number]        Attending progress note    Date of Admission:  03/18/2014     Principal Problem:   Hematochezia Active Problems:   CIGARETTE SMOKER   Essential hypertension   CAD (coronary artery disease)   Abdominal pain, acute, left lower quadrant   GERD (gastroesophageal reflux disease)   . ciprofloxacin  400 mg Intravenous Q12H  . diltiazem  360 mg Oral Daily  . metoprolol succinate  25 mg Oral BID  . metronidazole  500 mg Intravenous Q6H  . pantoprazole  40 mg Oral BID  . pravastatin  40 mg Oral QPM    I have seen and examined [name] with our team today. He is admitted with recurrent hematochezia and left lower quadrant abdominal pain that he rated 10 out of 10 on admission. I suspect this is due to colonic ischemia rather than infectious diarrhea or diverticulitis. I agree with flexible sigmoidoscopy today.  Michel Bickers, MD [facility] [phone] pager   [phone] cell 03/19/2014, 1:15 PM

## 2014-03-19 NOTE — Progress Notes (Signed)
Subjective: [name] felt his LLQ abdominal pain is 5/10 compared to 10/10 on presentation. He had a bloody BM last night but has not had since this morning. GI saw him today and told him he may have ischemic colitis and he will undergoing a flex sig today.   Objective: Vital signs in last 24 hours: Filed Vitals:   03/19/14 1405 03/19/14 1410 03/19/14 1415 03/19/14 1420  BP: 107/38 100/33  97/34  Pulse: 59 54 54 55  Temp:      TempSrc:      Resp: 16 16 16 14   Height:      Weight:      SpO2: 97% 98% 96% 98%   Weight change:   Intake/Output Summary (Last 24 hours) at 03/19/14 1429 Last data filed at 03/19/14 0801  Gross per 24 hour  Intake 1266.25 ml  Output      0 ml  Net 1266.25 ml   General Appearance:  Alert, cooperative, no distress   Back:  Symmetric, no curvature, ROM normal, no CVA tenderness   Lungs:  Clear to auscultation bilaterally, respirations unlabored   Chest wall:  No tenderness or deformity   Heart:  Regular rate and rhythm, S1 and S2 normal, no murmur, rub or gallops   Abdomen:  Soft, nondistended, no organomegaly. Hyperactive bowel sounds. Tender to palpation in LLQ. Some guarding.   Extremities:  Extremities normal, atraumatic, no cyanosis or edema   Pulses:  2+ and symmetric all extremities    Lab Results: Results for Hunter, Mccarthy (MRN [id number]) as of 03/19/2014 11:24  Ref. Range 03/19/2014 04:01  Sodium Latest Range: 137-147 mEq/L 143  Potassium Latest Range: 3.7-5.3 mEq/L 3.9  Chloride Latest Range: 96-112 mEq/L 109  CO2 Latest Range: 19-32 mEq/L 22  BUN Latest Range: 6-23 mg/dL 9  Creatinine Latest Range: 0.50-1.35 mg/dL 0.73  Calcium Latest Range: 8.4-10.5 mg/dL 8.9  GFR calc non Af Amer Latest Range: >90 mL/min 89 (L)  GFR calc Af Amer Latest Range: >90 mL/min >90  Glucose Latest Range: 70-99 mg/dL 107 (H)  Anion gap Latest Range: 5-15  12  Alkaline Phosphatase Latest Range: 39-117 U/L 79  Albumin Latest Range: 3.5-5.2 g/dL 3.1 (L)  AST  Latest Range: 0-37 U/L 22  ALT Latest Range: 0-53 U/L 17  Total Protein Latest Range: 6.0-8.3 g/dL 6.3  Total Bilirubin Latest Range: 0.3-1.2 mg/dL 0.5  WBC Latest Range: 4.0-10.5 K/uL 8.7  RBC Latest Range: 4.22-5.81 MIL/uL 3.94 (L)  Hemoglobin Latest Range: 13.0-17.0 g/dL 11.8 (L)  HCT Latest Range: 39.0-52.0 % 36.0 (L)  MCV Latest Range: 78.0-100.0 fL 91.4  MCH Latest Range: 26.0-34.0 pg 29.9  MCHC Latest Range: 30.0-36.0 g/dL 32.8  RDW Latest Range: 11.5-15.5 % 13.3  Platelets Latest Range: 150-400 K/uL 276   Micro Results: Recent Results (from the past 240 hour(s))  MRSA PCR SCREENING     Status: None   Collection Time    03/18/14  8:18 PM      Result Value Ref Range Status   MRSA by PCR NEGATIVE  NEGATIVE Final   Comment:            The GeneXpert MRSA Assay (FDA     approved for NASAL specimens     only), is one component of a     comprehensive MRSA colonization     surveillance program. It is not     intended to diagnose MRSA     infection nor to guide or  monitor treatment for     MRSA infections.   Studies/Results: Ct Abdomen Pelvis W Contrast  03/18/2014   CLINICAL DATA:  Acute onset diarrhea 1 day ago, LEFT lower quadrant pain. History of diverticulitis and inflammatory bowel syndrome.  EXAM: CT ABDOMEN AND PELVIS WITH CONTRAST  TECHNIQUE: Multidetector CT imaging of the abdomen and pelvis was performed using the standard protocol following bolus administration of intravenous contrast.  CONTRAST:  100 cc Omnipaque 300.  COMPARISON:  CT of the abdomen and pelvis January 16, 2014 and CT of the abdomen and pelvis August 11, 2013  FINDINGS: LUNG BASES: Included view of the lung bases are clear. Visualized heart and pericardium are unremarkable.  SOLID ORGANS: 2.8 x 3.1 cm LEFT adrenal mass, relatively unchanged from prior imaging, 55 Hounsfield units on the initial phase, 31 Hounsfield units on delayed. The liver, spleen, pancreas, gallbladder and RIGHT adrenal gland are  unremarkable.  GASTROINTESTINAL TRACT: Extensive colonic diverticulosis. Market sigmoid bowel wall thickening of with slight descending colonic pericolonic inflammation. The stomach, small bowel are normal in course and caliber without inflammatory changes. Normal appendix.  KIDNEYS/ URINARY TRACT: Kidneys are orthotopic, demonstrating symmetric enhancement. No nephrolithiasis, hydronephrosis or solid renal masses. Multiple low-density cystic lesions in LEFT kidney largest measuring 9 x 5.9 cm, unchanged. Stable 10 mm cyst or RIGHT interpolar kidney. Low-density cysts versus angiomyolipoma and lower pole RIGHT kidney with additional too small to characterize hypodensities in the kidneys bilaterally. The unopacified ureters are normal in course and caliber. Delayed imaging through the kidneys demonstrates symmetric prompt contrast excretion within the proximal urinary collecting system. Urinary bladder is partially distended and unremarkable.  PERITONEUM/RETROPERITONEUM: No intraperitoneal free fluid nor free air. Aortoiliac vessels are normal in course and caliber, severe calcific atherosclerosis. No lymphadenopathy by CT size criteria. Internal reproductive organs are unremarkable.  SOFT TISSUE/OSSEOUS STRUCTURES: Nonsuspicious. Small fat containing inguinal hernias. Osteopenia. L4-5 and L5-S1 PLIF/posterior decompression with solid interbody fusion.  IMPRESSION: Colonic diverticulosis with superimposed rectosigmoid wall thickening, concerning for colitis (infectious or inflammatory), acute on chronic long segment of diverticulitis is less favored. No bowel perforation or obstruction.  Similar bilateral renal lesions.  Relatively stable LEFT adrenal mass.   Electronically Signed   By: Elon Alas   On: 03/18/2014 06:05   Dg Abd Acute W/chest  03/18/2014   CLINICAL DATA:  Generalized abdominal pain with bloody diarrhea  EXAM: ACUTE ABDOMEN SERIES (ABDOMEN 2 VIEW & CHEST 1 VIEW)  COMPARISON:  CT abdomen and  pelvis March 18, 2014  FINDINGS: PA chest: There is mild bibasilar subsegmental atelectasis. Elsewhere lungs are clear. Heart size and pulmonary vascularity are normal. No adenopathy. Atherosclerotic change is noted in the aorta.  Spine and left lateral decubitus abdomen: Bowel gas pattern is unremarkable. No obstruction or free air. There is postoperative change in the lower lumbar spine. There is intravenous contrast within the left kidney. There is evidence of the distortion of the left collecting system due to multiple large cysts seen on CT earlier in the day. Contrast is also seen in the urinary bladder.  IMPRESSION: Overall bowel gas pattern unremarkable. No obstruction or free air. Mild subsegmental atelectasis in lung bases. No lung edema or consolidation.   Electronically Signed   By: Lowella Grip M.D.   On: 03/18/2014 15:45   Medications:  Scheduled Meds: . [facility] HOLD] ciprofloxacin  400 mg Intravenous Q12H  . [facility] HOLD] diltiazem  360 mg Oral Daily  . [facility] HOLD] metoprolol succinate  25 mg Oral BID  . [  [facility] HOLD] metronidazole  500 mg Intravenous Q6H  . [[facility] HOLD] pantoprazole  40 mg Oral BID  . [facility] HOLD] pravastatin  40 mg Oral QPM   Continuous Infusions: . sodium chloride 150 mL/hr at 03/19/14 0952  . sodium chloride     PRN Meds:.[[facility] HOLD]  HYDROmorphone (DILAUDID) injection, [[facility] HOLD] topiramate, [[facility] HOLD] traZODone Assessment/Plan: Principal Problem:   Hematochezia Active Problems:   CIGARETTE SMOKER   Essential hypertension   CAD (coronary artery disease)   Abdominal pain, acute, left lower quadrant   GERD (gastroesophageal reflux disease)  [name] is a 73 year old gentleman with a history of suspected diverticular bleed 08/15, hemorrhoids, diverticulosis, CAD, hypertension, and GERD who presents with two day duration of worsening LLQ abdominal pain and bloody diarrhea.   1. Hematochezia: Given acute onset of left lower quadrant pain and associated bloody  diarrhea, and upon further review of his imaging, there seems to be extensive atherosclerotic disease evidenced by calcifications in his aortoiliac vessels, extensive smoking history, patient's presentation is likely due to ischemic colitis. Patient presented with abdominal pain and diarrhea in 03/2008 and flexible sigmoidoscopy had revealed sigmoid erythema and biopsy was taken which showed ischemic bowel. A diverticular source of bleeding cannot be excluded though painless bleeding would be expected. Diverticulitis seems less likely given patient has been afebrile and WBC is not elevated.  FOBT was negative on day of admission and hemorrhoids less likely given location of pain, ED physician did not any active bleeding from anal region, and patient mentioned that he has not hemorrhoidal bleeding. Malignancy also less likely as patient denies overt weight loss. Since admission, patient has one bloody bowel movement but his hemoglobin has remained stable from 13.1 to 12.8 to 11.8. GI has been consulted and are suspicious for ischemic colitis and would like to pursue a diagnostic flexible sigmoidiscopy  --continue IV flagyl and ciprofloxacin but may discontinue if sigmoidoscopy does reveal ischemic colitis  --increase IV fluids to 150 mL/h  --appreciated GI recs and input --f/u CBC AM to see if Hgb drops  --PO Protonix  --Zofran PRN nausea  --Dilaudid 0.5 mg PRN pain   2. Sinus Tachycardia: resolved, in the 60s overnight. Initial HR at first ED visit was 76 but had been in 100-110 since the second ED visit. Likely due to volume loss from hematochezia and recurrent diarrhea and/or setting of infection.  --telemetry  --continue home metoprolol   3. Hypokalemia: resolved after fluids, at 3.9. 3.3 at first ED visit and return value was 3.0. Likely in the setting of GI  losses via diarrhea.  --repeat BMP AM  --continue IV fluids   4. Essential Hypertension: elevated at 159/81 on presentation likely in the  setting of not taking his medications this morning  -continue home diltiazem and metoprolol   5. Peptic Ulcer Disease: stable  --continue Protonix drip   6. CAD: left heart catheterization done in 2005 showed 30% left main stenosis, an extensively calcified LAD with moderate plaquing, and EF was 60%  --continue home pravastatin and metoprolol   7. GERD: stable, takes Prilosec  --continue Protonix   8. Headaches: patient has history of headaches  --Topamax PRN   9. Tobacco Use: ~60 pack per year history, smokes ~1 pack per day  --counseled on cessation  --nicotine patch PRN   This is a Careers information officer Note.  The care of the patient was discussed with Dr. Megan Salon and the assessment and plan formulated with their assistance.  Please see  their attached note for official documentation of the daily encounter.   LOS: 1 day   Josiah Lobo, Med Student 03/19/2014, 2:29 PM

## 2014-03-19 NOTE — Progress Notes (Signed)
  I have seen and examined the patient, and reviewed the daily progress note by Josiah Lobo, MS 4 and discussed the care of the patient with them. Please see my progress note from 03/19/2014 for further details regarding assessment and plan.    Signed:  Blain Pais, MD PGY3, IMTS 03/19/2014, 6:09 PM

## 2014-03-19 NOTE — Progress Notes (Signed)
I have seen the patient and reviewed the daily progress note by Josiah Lobo  MS 4 and discussed the care of the patient with them.  See below for documentation of my findings, assessment, and plans.  Subjective: His abdominal pain improved somewhat but is still intermittent with the low left lower quadrant. He had a BM with bright red blood but none this morning.   Objective: Vital signs in last 24 hours: Filed Vitals:   03/19/14 1410 03/19/14 1415 03/19/14 1420 03/19/14 1513  BP: 100/33  97/34 111/54  Pulse: 54 54 55 56  Temp:    98 F (36.7 C)  TempSrc:    Oral  Resp: 16 16 14 13   Height:      Weight:      SpO2: 98% 96% 98% 95%   Weight change:   Intake/Output Summary (Last 24 hours) at 03/19/14 1514 Last data filed at 03/19/14 1350  Gross per 24 hour  Intake 1506.25 ml  Output      0 ml  Net 1506.25 ml   Vitals reviewed. General: resting in bed, in NAD HEENT: no scleral icterus Cardiac: RRR, no rubs, murmurs or gallops Pulm: clear to auscultation bilaterally, no wheezes, rales, or rhonchi Abd: soft, LLQ mildly TTP, nondistended, BS present Ext: warm and well perfused, no pedal edema Neuro: alert and oriented X3, moves all extremities voluntairly  Lab Results: Reviewed and documented in Electronic Record Micro Results: Reviewed and documented in Electronic Record Studies/Results: Reviewed and documented in Electronic Record Medications: I have reviewed the patient's current medications. Scheduled Meds: . ciprofloxacin  400 mg Intravenous Q12H  . diltiazem  360 mg Oral Daily  . metoprolol succinate  25 mg Oral BID  . metronidazole  500 mg Intravenous Q6H  . pantoprazole  40 mg Oral BID  . pravastatin  40 mg Oral QPM   Continuous Infusions: . sodium chloride 150 mL/hr at 03/19/14 0952   PRN Meds:.HYDROmorphone (DILAUDID) injection, topiramate, traZODone Assessment/Plan: 73 year old man with PMH of arteriolosclerosis, hx of ischemic colitis,  presenting with sharp LLQ pain and hematochezia.   Hematochezia: He reported acute LLQ abd pain with bloody diarrhea on presentation. His Hgb remains stable. His diarrhea has resolved and his pain has improved. Differential to include ischemic colitis (very likely as he has extensive atherosclerosis on CT abdomen, and had flex sig with path in 2009 c/w with ischemic colitis), bowel necrosis (unlikely with lactic acid <1.0 on presentation and improving abdominal pain, no s/s of shock), diverticulitis with diverticular bleed (less likely given that he remains afebrile with no leucocytosis), malignancy of lower GI (less likely with no weight loss, night sweats, and acute onset). He underwent flex sig today with findings of scattered diverticula, no colitis, mild erythema.  -GI following, appreciate recs -Continue broad spectrum Abx (IV flagyl, IV cipro) -f/u flex sig path report -Continue supportive tx with IVF, Zofran PRN for nausea, Dilaudid 0.5mg  q4h PRN for pain -Per GI, start clear liquid diet -Continue Protonix 40mg  PO BID  -CBC in am  CAD/atherlosclerosis: Had LHC in 2005 with 30% LM stenosis, extensively calcified LAD with mod plaque, EF of 60%. Also has extensive arteriolosclerosis per abd CT.  -Continue home pravastatin and metoprolol   Hypokalemia: resolved. K of 3.9 today.   HTN: BP well controlled today. Continue home Diltiazem 360mg  24h tab, metoprolol 25mg  24 hr tab.  Peptic ulcer disease: Stable. Continue Protonix.  GERD: Stable, continue PPI (takes Prilosec at home for this).  Headaches:  He has hx of headaches and takes topamax PRN which will be continued.   Tobacco use: Has ~60 pack-year smoking hx, currently smokes about 1 pack per day. Did not want nicotine patch.  -Continue smoking cessation counseling -Offer nicotine patch PRN  DVT prophylaxis: SCDs  FEN:  NS 175ml/hr BMET in am Clear liquid diet   Dispo: Disposition is deferred at this time, awaiting  improvement of current medical problems.  Anticipated discharge in approximately 1-2 day(s).   The patient does have a current PCP Rodena Medin, MD) and does not need an [facility] hospital follow-up appointment after discharge.  The patient does not have transportation limitations that hinder transportation to clinic appointments.  .Services Needed at time of discharge: Y = Yes, Blank = No PT:   OT:   RN:   Equipment:   Other:     LOS: 1 day   Blain Pais, MD PGY3, IMTS 03/19/2014, 3:14 PM

## 2014-03-19 NOTE — Consult Note (Addendum)
[facility] Consult: 8:37 AM 03/19/2014  LOS: 1 day    Referring Provider: Dr Megan Salon.  Primary Care Physician:  Rodena Medin, MD Primary Gastroenterologist:  Dr. Fuller Plan     Reason for Consultation:  Bloody diarrhea, abdominal pain and colitis on CT.    HPI: [name] is a 73 y.o. male. S/p 2009 radical prostatectomy for cancer, CAD, s/p ureteral stone lithotripsy. Stable renal masses. DDD/DJD s/p lumbar fusion. Hx IBS.  Diverticulosis and hemorrhoids noted on the 3 colonoscopies and one flex sig of 2002 - 2013. Flex sig in 2009 showed sigmoid erythema and biopsies proved ischemia. Antral ulcer, gastritis, HH and esophageal stricture (dilated) on EGD in 2008.  Admission 01/16/2014 - 01/20/2014 with hematochezia and LLQ pain.  CT scan showed stable, extensive diverticulosis greatest in sigmoid colon.  Hgb stable ~ 11, c/w baseline of 14 to 15. Presumed to have diverticular bleed but did have sigmoid ischemia in 2009 and known aorto-vascular disease (but no mesenteric vascular disease on CT imaging), so ? of ischemic colitis.  Had been taking Excedrin containing 1400 mg ASA on daily basis, Omeprazole 20 mg BID.   ROV follow up with Dr Fuller Plan on 9/3.  No further bleeding or abdominal pain.  but was c/o weakness and dizziness. Has continued to avoid ASA.   Readmitted yesterday with 3 days bloody diarrhea and LLQ pain. + nausea but no emesis, fevers/chills. Seen in ED earlier that day ~ 1 AM where CT showed ? colitis of rectosigmoid.  WBCs 13.7, Hgb 13. Discharged on Cipro, Flagyl.  Returned to ED at 2 PM with ongoing bloody diarrhea and abdominal pain.  Now on IV Cipro and IV flagyl.  Pt says as of yesterday the diarrhea and bleeding had slowed down in volume and frequency and has not had any stools today.  The pain in  LLQ persists. Overall the current episode marked by more pain and less bleeding than one month ago.    Other ROS  No new meds or change in doses of his Diltiazem or Metoprolol.  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 Smokes ~ 18 cigs per day. No ETOH.  Bruises easily on arms but no nasal, urologic, GI bleeding in general. + neurogenic claudication in legs (with rapid walking or longer sets of stairs). + numbness in heels and metatarsals.  No swelling of limbs. Occasional arrythmia, extra beats: no recent acceleration of this. No chest pain. No DOE, no PND   ENDOSCOPIC STUDIES:  04/2012 Colonoscopy Dr Fuller Plan  INDICATIONS: 1) heme positive stool 2) hematochezia 3) change  in bowel habits  ENDOSCOPIC IMPRESSION:  1) Moderate diverticulosis in the sigmoid to transverse colon  2) Internal and external hemorrhoids  RECOMMENDATIONS:  1) High fiber diet with liberal fluid intake.  2) Given your age, you will not need another colonoscopy for  colon cancer screening or polyp surveillance. These types of tests  usually stop around age 26.   03/2008 Flex Sig Dr  Fuller Plan  For: Diarrhea.  DIVERTICULOSIS: Descending Colon to Sigmoid Colon. Not bleeding. ICD9:  Diverticulosis: 562.10.  MUCOSAL ABNORMALITY: Sigmoid Colon. Erythema present. Comments:  patchy erythema associated with diverticulosis.  Pathology: COLON, SIGMOID, BIOPSIES: HEMORRHAGE AND MUCOSAL EROSION WITH FEATURES CONSISTENT WITH ISCHEMIA.   03/2007 Colonoscopy  Diverticulosis, internal/external rrhoids   01/2001 EGD Dr Leanora Cover  For dysphagia, abd pain  Antral ulcer, esophageal stricture (was dilated), gastritis, [facility]   01/2001 Colonoscopy Dr Velora Heckler.  Diverticulosis, hemorrhoids.    Past Medical History  Diagnosis Date  . Hyperlipidemia   . Hypertension   . GERD (gastroesophageal reflux disease)   . PUD (peptic ulcer disease)  2008    antral ulcer  . Esophageal stricture 2008    peptic stricture: dilated.   . Prostate cancer 11/2007  . ED (erectile dysfunction)   . Benign neoplasm of adrenal gland   . Peyronie's disease   . Depression   . Diverticulosis 2002  . Coronary artery disease 2005    2005 Cath with 30% LAD, calcification/plaque of LAD, EF 60%  . Palpitations   . Hemorrhoids 2002  . Arthritis     DDD/ DJD  . Cataract     "beginnings" per pt.  . History of kidney stones 2014    right ureteral lithotripsy 01/2013  . IBS (irritable bowel syndrome)     Past Surgical History  Procedure Laterality Date  . Spinal fusion  99,06  . Hernia repair    . Prostatectomy  12/2007    radical.  for prostate CA  . Tonsillectomy and adenoidectomy    . Nasal septum surgery      Prior to Admission medications   Medication Sig Start Date End Date Taking? Authorizing Provider  HYDROcodone-acetaminophen (NORCO/VICODIN) 5-325 MG per tablet Take 1 tablet by mouth every 6 (six) hours as needed for moderate pain.   Yes Historical Provider, MD  oxymetazoline (AFRIN) 0.05 % nasal spray Place 2 sprays into the nose at bedtime as needed for congestion.   Yes Historical Provider, MD  topiramate (TOPAMAX) 25 MG tablet Take 25 mg by mouth daily as needed (for headaches).    Yes Historical Provider, MD  traZODone (DESYREL) 50 MG tablet Take 50 mg by mouth at bedtime as needed for sleep.   Yes Historical Provider, MD  ciprofloxacin (CIPRO) 500 MG tablet Take 1 tablet (500 mg total) by mouth 2 (two) times daily. 03/18/14   Leota Jacobsen, MD  dicyclomine (BENTYL) 10 MG capsule Take 1 capsule (10 mg total) by mouth 3 (three) times daily. 11/16/13   Ladene Artist, MD  diltiazem [facility]) 360 MG 24 hr capsule Take 360 mg by mouth daily.    Historical Provider, MD  metoprolol succinate (TOPROL-XL) 25 MG 24 hr tablet Take 25 mg by mouth 2 (two) times daily.    Historical Provider, MD  metroNIDAZOLE (FLAGYL) 500 MG tablet Take 1  tablet (500 mg total) by mouth 2 (two) times daily. 03/18/14   Leota Jacobsen, MD  Multiple Vitamin (MULTIVITAMIN WITH MINERALS) TABS tablet Take 1 tablet by mouth daily.    Historical Provider, MD  omeprazole (PRILOSEC) 20 MG capsule Take 20 mg by mouth 2 (two) times daily.    Historical Provider, MD  oxyCODONE-acetaminophen (PERCOCET/ROXICET) 5-325 MG per tablet Take 1-2 tablets by mouth every 4 (four) hours as needed for moderate pain or severe pain. 03/18/14   Leota Jacobsen, MD  pravastatin (PRAVACHOL) 40 MG tablet Take 1 tablet (40 mg total)  by mouth every evening. 08/21/13   Larey Dresser, MD  Probiotic Product (PROBIOTIC DAILY PO) Take 1 tablet by mouth daily.    Historical Provider, MD    Scheduled Meds: . ciprofloxacin  400 mg Intravenous Q12H  . diltiazem  360 mg Oral Daily  . metoprolol succinate  25 mg Oral BID  . metronidazole  500 mg Intravenous Q6H  . pantoprazole  40 mg Oral BID  . pravastatin  40 mg Oral QPM   Infusions: . sodium chloride 75 mL/hr at 03/19/14 0400   PRN Meds: HYDROmorphone (DILAUDID) injection, topiramate, traZODone   Allergies as of 03/18/2014 - Review Complete 03/18/2014  Allergen Reaction Noted  . Etodolac  01/19/2007  . Sulfonamide derivatives Itching   . Pseudoephedrine Palpitations 01/19/2007    Family History  Problem Relation Age of Onset  . Colon cancer Maternal Aunt 22  . Heart attack Father 62  . Aneurysm Father   . Lymphoma Maternal Aunt   . Bone cancer Maternal Grandfather     History   Social History  . Marital Status: Married    Spouse Name: N/A    Number of Children: 2  . Years of Education: N/A   Occupational History  . Part time work Sport and exercise psychologist.     Social History Main Topics  . Smoking status: Current Every Day Smoker -- 0.75 packs/day for 53 years    Types: Cigarettes  . Smokeless tobacco: Never Used  . Alcohol Use: No  . Drug Use: No  . Sexual Activity: Yes     REVIEW OF  SYSTEMS: Constitutional:  No weakness. ENT:  No nose bleeds Pulm:  No SOB or cough CV:  Occasional palpitations but no recent accelerations in occurrences, no LE edema. No chest pain  GU:  No hematuria, no frequency. GI:  Per HPI.  No heartburn or dysphagia Heme:  No unusual bruising or non-GI bleedig   Transfusions:  none Neuro:  No headaches, no peripheral tingling or numbness.  Not dizzy, no falls Derm:  No itching, no rash or sores.  Endocrine:  No sweats or chills.  No polyuria or dysuria Immunization:  Reviewed.  No flu shot this season. Travel:  None beyond local counties in last few months.    PHYSICAL EXAM: Vital signs in last 24 hours: Filed Vitals:   03/19/14 0801  BP: 134/50  Pulse: 68  Temp: 98.7 F (37.1 C)  Resp: 14   Wt Readings from Last 3 Encounters:  03/18/14 57.6 kg (126 lb 15.8 oz)  03/18/14 58.968 kg (130 lb)  02/08/14 58.231 kg (128 lb 6 oz)    General: pleasant, does not look ill. Comfortable.  Head:  No asymmetry or swelling Eyes:  No icterus or pallor. Ears:  Not HOH  Nose:  No congestion or discharge Mouth:  Clear, moist.  No blood Neck:  No JVD, no mass, no TMG Lungs:  Clear bil.   No cough or dyspnea Heart: RRR.  No mrg Abdomen:  Soft, ND.  BS present but hypoactive.  Tender without guard or rebound in left abdomen.   Rectal: deferred   Musc/Skeltl: no joint swelling or contracture Extremities:  No CCE  Neurologic:  Oriented x 3.  No limb weakness.  No tremor Skin:  No rash, sores or telangectasia Tattoos:  none Nodes:  No cervical adenopathy.    Psych:  Pleasant, cooperative, relaxed.   Intake/Output from previous day: 10/11 0701 - 10/12 0700 In: 1266.3 [I.V.:666.3; IV Piggyback:600] Out: -  Intake/Output this shift:    LAB RESULTS:  Recent Labs  03/18/14 0158 03/18/14 1450 03/19/14 0401  WBC 13.7* 13.7* 8.7  HGB 13.1 12.8* 11.8*  HCT 39.1 38.6* 36.0*  PLT 328 299 276   BMET Lab Results  Component Value Date   NA  143 03/19/2014   NA 141 03/18/2014   NA 142 03/18/2014   K 3.9 03/19/2014   K 3.0* 03/18/2014   K 3.3* 03/18/2014   CL 109 03/19/2014   CL 105 03/18/2014   CL 105 03/18/2014   CO2 22 03/19/2014   CO2 24 03/18/2014   CO2 22 03/18/2014   GLUCOSE 107* 03/19/2014   GLUCOSE 113* 03/18/2014   GLUCOSE 122* 03/18/2014   BUN 9 03/19/2014   BUN 13 03/18/2014   BUN 16 03/18/2014   CREATININE 0.73 03/19/2014   CREATININE 0.67 03/18/2014   CREATININE 0.74 03/18/2014   CALCIUM 8.9 03/19/2014   CALCIUM 9.1 03/18/2014   CALCIUM 9.1 03/18/2014   LFT  Recent Labs  03/18/14 0158 03/18/14 1450 03/19/14 0401  PROT 7.3 7.0 6.3  ALBUMIN 3.7 3.5 3.1*  AST 16 15 22   ALT 18 16 17   ALKPHOS 95 94 79  BILITOT 0.3 0.4 0.5   PT/INR Lab Results  Component Value Date   INR 0.97 01/16/2014   INR 1.15 10/11/2009   C-Diff No components found with this basename: cdiff   Lipase     Component Value Date/Time   LIPASE 12 03/18/2014 1450     RADIOLOGY STUDIES: Ct Abdomen Pelvis W Contrast 03/18/2014   CLINICAL DATA:  Acute onset diarrhea 1 day ago, LEFT lower quadrant pain. History of diverticulitis and inflammatory bowel syndrome.  EXAM: CT ABDOMEN AND PELVIS WITH CONTRAST  TECHNIQUE: Multidetector CT imaging of the abdomen and pelvis was performed using the standard protocol following bolus administration of intravenous contrast.  CONTRAST:  100 cc Omnipaque 300.  COMPARISON:  CT of the abdomen and pelvis January 16, 2014 and CT of the abdomen and pelvis August 11, 2013  FINDINGS: LUNG BASES: Included view of the lung bases are clear. Visualized heart and pericardium are unremarkable.  SOLID ORGANS: 2.8 x 3.1 cm LEFT adrenal mass, relatively unchanged from prior imaging, 55 Hounsfield units on the initial phase, 31 Hounsfield units on delayed. The liver, spleen, pancreas, gallbladder and RIGHT adrenal gland are unremarkable.  GASTROINTESTINAL TRACT: Extensive colonic diverticulosis. [place] sigmoid bowel  wall thickening of with slight descending colonic pericolonic inflammation. The stomach, small bowel are normal in course and caliber without inflammatory changes. Normal appendix.  KIDNEYS/ URINARY TRACT: Kidneys are orthotopic, demonstrating symmetric enhancement. No nephrolithiasis, hydronephrosis or solid renal masses. Multiple low-density cystic lesions in LEFT kidney largest measuring 9 x 5.9 cm, unchanged. Stable 10 mm cyst or RIGHT interpolar kidney. Low-density cysts versus angiomyolipoma and lower pole RIGHT kidney with additional too small to characterize hypodensities in the kidneys bilaterally. The unopacified ureters are normal in course and caliber. Delayed imaging through the kidneys demonstrates symmetric prompt contrast excretion within the proximal urinary collecting system. Urinary bladder is partially distended and unremarkable.  PERITONEUM/RETROPERITONEUM: No intraperitoneal free fluid nor free air. Aortoiliac vessels are normal in course and caliber, severe calcific atherosclerosis. No lymphadenopathy by CT size criteria. Internal reproductive organs are unremarkable.  SOFT TISSUE/OSSEOUS STRUCTURES: Nonsuspicious. Small fat containing inguinal hernias. Osteopenia. L4-5 and L5-S1 PLIF/posterior decompression with solid interbody fusion.  IMPRESSION: Colonic diverticulosis with superimposed rectosigmoid wall thickening, concerning for colitis (infectious or inflammatory), acute on chronic long  segment of diverticulitis is less favored. No bowel perforation or obstruction.  Similar bilateral renal lesions.  Relatively stable LEFT adrenal mass.   Electronically Signed   By: Elon Alas   On: 03/18/2014 06:05   Dg Abd Acute W/chest 03/18/2014   CLINICAL DATA:  Generalized abdominal pain with bloody diarrhea  EXAM: ACUTE ABDOMEN SERIES (ABDOMEN 2 VIEW & CHEST 1 VIEW)  COMPARISON:  CT abdomen and pelvis March 18, 2014  FINDINGS: PA chest: There is mild bibasilar subsegmental atelectasis.  Elsewhere lungs are clear. Heart size and pulmonary vascularity are normal. No adenopathy. Atherosclerotic change is noted in the aorta.  Spine and left lateral decubitus abdomen: Bowel gas pattern is unremarkable. No obstruction or free air. There is postoperative change in the lower lumbar spine. There is intravenous contrast within the left kidney. There is evidence of the distortion of the left collecting system due to multiple large cysts seen on CT earlier in the day. Contrast is also seen in the urinary bladder.  IMPRESSION: Overall bowel gas pattern unremarkable. No obstruction or free air. Mild subsegmental atelectasis in lung bases. No lung edema or consolidation.   Electronically Signed   By: Lowella Grip M.D.   On: 03/18/2014 15:45     IMPRESSION:   *  Segmental colitis.  On empiric cipro/flagyl for possible infectious etiology.  However with the level of pain, bloody diarrrhea and hx sigmoid ischemia on 2009 bx, suspect this is ischemic colitis.   *  Known diverticulitis with presumed diverticular bleed one month ago.  This may have been ischemic colitis as well.     *  Hypokalemia. Got one dose of oral potassium yesterday.  Potassium improved.     PLAN:     *  Pt d/w Dr Deatra Ina.  Plan to pursuediagnostic flex sig to pinpoint etiology of colitis. Pt agreeable and case set for 2 PM today.  Tap water enema once pre scope.   *  Continue cipro/flagyl for now, but if this is ischemic, we can d/c these.   *  Upped  IVF (NS) to 150 ml/hour. Consider adding some potassium to the IVF.     Azucena Freed  03/19/2014, 8:37 AM Pager: [phone]  GI Attending Note   Chart was reviewed and patient was examined. X-rays and lab were reviewed.    I agree with management and plans.  Presentation and x-ray findings are much more suggestive of ischemic colitis than acute diverticulitis.  Limited sigmoidoscopy may help to differentiate the two.  Primary enteric infection is less likely.   Pending results of sigmoidoscopy may consider CTA.  Sandy Salaam. Deatra Ina, M.D., [facility] Cell [phone]

## 2014-03-19 NOTE — Progress Notes (Signed)
Subjective: [name] felt his LLQ abdominal pain is 5/10 compared to 10/10 on presentation. He had a bloody BM last night but has not had since this morning. GI saw him today and told him he may have ischemic colitis and he will undergoing a flex sig today.   Objective: Vital signs in last 24 hours: Filed Vitals:   03/19/14 0500 03/19/14 0600 03/19/14 0801 03/19/14 0950  BP: 110/46 122/51 134/50 135/63  Pulse: 59 60 68 63  Temp:   98.7 F (37.1 C)   TempSrc:   Oral   Resp: 16 16 14    Height:      Weight:      SpO2: 95% 94% 94%    Weight change:   Intake/Output Summary (Last 24 hours) at 03/19/14 1136 Last data filed at 03/19/14 0801  Gross per 24 hour  Intake 1266.25 ml  Output      0 ml  Net 1266.25 ml   General Appearance:  Alert, cooperative, no distress   Back:  Symmetric, no curvature, ROM normal, no CVA tenderness   Lungs:  Clear to auscultation bilaterally, respirations unlabored   Chest wall:  No tenderness or deformity   Heart:  Regular rate and rhythm, S1 and S2 normal, no murmur, rub or gallops   Abdomen:  Soft, nondistended, no organomegaly. Hyperactive bowel sounds. Tender to palpation in LLQ. Some guarding.   Extremities:  Extremities normal, atraumatic, no cyanosis or edema   Pulses:  2+ and symmetric all extremities    Lab Results: Results for [name], [name] (MRN [id number]) as of 03/19/2014 11:24  Ref. Range 03/19/2014 04:01  Sodium Latest Range: 137-147 mEq/L 143  Potassium Latest Range: 3.7-5.3 mEq/L 3.9  Chloride Latest Range: 96-112 mEq/L 109  CO2 Latest Range: 19-32 mEq/L 22  BUN Latest Range: 6-23 mg/dL 9  Creatinine Latest Range: 0.50-1.35 mg/dL 0.73  Calcium Latest Range: 8.4-10.5 mg/dL 8.9  GFR calc non Af Amer Latest Range: >90 mL/min 89 (L)  GFR calc Af Amer Latest Range: >90 mL/min >90  Glucose Latest Range: 70-99 mg/dL 107 (H)  Anion gap Latest Range: 5-15  12  Alkaline Phosphatase Latest Range: 39-117 U/L 79  Albumin Latest Range:  3.5-5.2 g/dL 3.1 (L)  AST Latest Range: 0-37 U/L 22  ALT Latest Range: 0-53 U/L 17  Total Protein Latest Range: 6.0-8.3 g/dL 6.3  Total Bilirubin Latest Range: 0.3-1.2 mg/dL 0.5  WBC Latest Range: 4.0-10.5 K/uL 8.7  RBC Latest Range: 4.22-5.81 MIL/uL 3.94 (L)  Hemoglobin Latest Range: 13.0-17.0 g/dL 11.8 (L)  HCT Latest Range: 39.0-52.0 % 36.0 (L)  MCV Latest Range: 78.0-100.0 fL 91.4  MCH Latest Range: 26.0-34.0 pg 29.9  MCHC Latest Range: 30.0-36.0 g/dL 32.8  RDW Latest Range: 11.5-15.5 % 13.3  Platelets Latest Range: 150-400 K/uL 276   Micro Results: Recent Results (from the past 240 hour(s))  MRSA PCR SCREENING     Status: None   Collection Time    03/18/14  8:18 PM      Result Value Ref Range Status   MRSA by PCR NEGATIVE  NEGATIVE Final   Comment:            The GeneXpert MRSA Assay (FDA     approved for NASAL specimens     only), is one component of a     comprehensive MRSA colonization     surveillance program. It is not     intended to diagnose MRSA     infection nor to guide or  monitor treatment for     MRSA infections.   Studies/Results: Ct Abdomen Pelvis W Contrast  03/18/2014   CLINICAL DATA:  Acute onset diarrhea 1 day ago, LEFT lower quadrant pain. History of diverticulitis and inflammatory bowel syndrome.  EXAM: CT ABDOMEN AND PELVIS WITH CONTRAST  TECHNIQUE: Multidetector CT imaging of the abdomen and pelvis was performed using the standard protocol following bolus administration of intravenous contrast.  CONTRAST:  100 cc Omnipaque 300.  COMPARISON:  CT of the abdomen and pelvis January 16, 2014 and CT of the abdomen and pelvis August 11, 2013  FINDINGS: LUNG BASES: Included view of the lung bases are clear. Visualized heart and pericardium are unremarkable.  SOLID ORGANS: 2.8 x 3.1 cm LEFT adrenal mass, relatively unchanged from prior imaging, 55 Hounsfield units on the initial phase, 31 Hounsfield units on delayed. The liver, spleen, pancreas, gallbladder and  RIGHT adrenal gland are unremarkable.  GASTROINTESTINAL TRACT: Extensive colonic diverticulosis. Market sigmoid bowel wall thickening of with slight descending colonic pericolonic inflammation. The stomach, small bowel are normal in course and caliber without inflammatory changes. Normal appendix.  KIDNEYS/ URINARY TRACT: Kidneys are orthotopic, demonstrating symmetric enhancement. No nephrolithiasis, hydronephrosis or solid renal masses. Multiple low-density cystic lesions in LEFT kidney largest measuring 9 x 5.9 cm, unchanged. Stable 10 mm cyst or RIGHT interpolar kidney. Low-density cysts versus angiomyolipoma and lower pole RIGHT kidney with additional too small to characterize hypodensities in the kidneys bilaterally. The unopacified ureters are normal in course and caliber. Delayed imaging through the kidneys demonstrates symmetric prompt contrast excretion within the proximal urinary collecting system. Urinary bladder is partially distended and unremarkable.  PERITONEUM/RETROPERITONEUM: No intraperitoneal free fluid nor free air. Aortoiliac vessels are normal in course and caliber, severe calcific atherosclerosis. No lymphadenopathy by CT size criteria. Internal reproductive organs are unremarkable.  SOFT TISSUE/OSSEOUS STRUCTURES: Nonsuspicious. Small fat containing inguinal hernias. Osteopenia. L4-5 and L5-S1 PLIF/posterior decompression with solid interbody fusion.  IMPRESSION: Colonic diverticulosis with superimposed rectosigmoid wall thickening, concerning for colitis (infectious or inflammatory), acute on chronic long segment of diverticulitis is less favored. No bowel perforation or obstruction.  Similar bilateral renal lesions.  Relatively stable LEFT adrenal mass.   Electronically Signed   By: Elon Alas   On: 03/18/2014 06:05   Dg Abd Acute W/chest  03/18/2014   CLINICAL DATA:  Generalized abdominal pain with bloody diarrhea  EXAM: ACUTE ABDOMEN SERIES (ABDOMEN 2 VIEW & CHEST 1 VIEW)   COMPARISON:  CT abdomen and pelvis March 18, 2014  FINDINGS: PA chest: There is mild bibasilar subsegmental atelectasis. Elsewhere lungs are clear. Heart size and pulmonary vascularity are normal. No adenopathy. Atherosclerotic change is noted in the aorta.  Spine and left lateral decubitus abdomen: Bowel gas pattern is unremarkable. No obstruction or free air. There is postoperative change in the lower lumbar spine. There is intravenous contrast within the left kidney. There is evidence of the distortion of the left collecting system due to multiple large cysts seen on CT earlier in the day. Contrast is also seen in the urinary bladder.  IMPRESSION: Overall bowel gas pattern unremarkable. No obstruction or free air. Mild subsegmental atelectasis in lung bases. No lung edema or consolidation.   Electronically Signed   By: Lowella Grip M.D.   On: 03/18/2014 15:45   Medications:  Scheduled Meds: . ciprofloxacin  400 mg Intravenous Q12H  . diltiazem  360 mg Oral Daily  . metoprolol succinate  25 mg Oral BID  . metronidazole  500 mg  Intravenous Q6H  . pantoprazole  40 mg Oral BID  . pravastatin  40 mg Oral QPM   Continuous Infusions: . sodium chloride 150 mL/hr at 03/19/14 0952  . sodium chloride     PRN Meds:.HYDROmorphone (DILAUDID) injection, topiramate, traZODone Assessment/Plan: Principal Problem:   Hematochezia Active Problems:   CIGARETTE SMOKER   Essential hypertension   CAD (coronary artery disease)   Abdominal pain, acute, left lower quadrant   GERD (gastroesophageal reflux disease)   Sinus tachycardia   Hypokalemia  [name] is a 73 year old gentleman with a history of suspected diverticular bleed 08/15, hemorrhoids, diverticulosis, CAD, hypertension, and GERD who presents with two day duration of worsening LLQ abdominal pain and bloody diarrhea.   1. Hematochezia: Given acute onset of left lower quadrant pain and associated bloody diarrhea, and upon further review of his  imaging, there seems to be extensive atherosclerotic disease exemplified by  differential includes colitis, diverticular bleed, diverticulitis, ischemic colitis, hemorrhoids, or malignancy. CT imaging shows evidence of colonic diverticulosis with superimposed rectosigmoid wall thickening concerning for colitis as an acute on chronic long segment of diverticulitis is less favored. However, given patient's prior GI bleed was due to a suspected diverticular source (confirmed diverticulosis from colonoscopy in 2013), this suspicion still holds at this time. Diverticulitis considered given his symptoms and WBC of 12.8 but patient has been afebrile since onset of symptoms. FOBT is negative and hemorrhoids less likely given location of pain, ED physician did not any active bleeding from anal region, and patient mentioned that he has not hemorrhoidal bleeding. Malignancy also less likely as patient denies overt weight loss and no family history of colon cancer.  --continue IV flagyl and ciprofloxacin  --continue IV fluids @ 75 mL/h  --placed GI consult  --NPO at midnight if patient needs scope  --f/u CBC AM to see if Hgb drops  --PO Protonix  --Zofran PRN nausea  --Dilaudid 0.5 mg PRN pain  2. Sinus Tachycardia: Initial HR at first ED visit was 76 but has been in 100-110 since second ED visit. Likely due to volume loss from hematochezia and recurrent diarrhea and/or setting of infection.  --telemetry  --continue home metoprolol  3. Hypokalemia: 3.3 at first ED visit and return value was 3.0. Likely in the setting of GI losses via diarrhea.  --repeat BMP AM  --continue IV fluids  4. Essential Hypertension: elevated at 159/81 on presentation likely in the setting of not taking his medications this morning  -continue home diltiazem and metoprolol  5. Peptic Ulcer Disease: stable  --continue Protonix drip  6. CAD: left heart catheterization done in 2005 showed 30% left main stenosis, an extensively calcified  LAD with moderate plaquing, and EF was 60%  --continue home pravastatin and metoprolol  7. GERD: stable, takes Prilosec  --continue Protonix  8. Headaches: patient has history of headaches  --Topamax PRN  9. Tobacco Use: ~60 pack per year history, smokes ~1 pack per day  --counseled on cessation  --nicotine patch PRN    This is a Careers information officer Note.  The care of the patient was discussed with Dr. Megan Salon and the assessment and plan formulated with their assistance.  Please see their attached note for official documentation of the daily encounter.   LOS: 1 day   Josiah Lobo, Med Student 03/19/2014, 11:36 AM

## 2014-03-19 NOTE — Progress Notes (Signed)
Utilization Review Completed.Ranjit Ashurst T10/05/2014  

## 2014-03-20 ENCOUNTER — Encounter (HOSPITAL_COMMUNITY): Payer: Self-pay | Admitting: Gastroenterology

## 2014-03-20 DIAGNOSIS — K559 Vascular disorder of intestine, unspecified: Secondary | ICD-10-CM | POA: Diagnosis not present

## 2014-03-20 LAB — CBC
HCT: 34.2 % — ABNORMAL LOW (ref 39.0–52.0)
HEMOGLOBIN: 11.3 g/dL — AB (ref 13.0–17.0)
MCH: 30.7 pg (ref 26.0–34.0)
MCHC: 33 g/dL (ref 30.0–36.0)
MCV: 92.9 fL (ref 78.0–100.0)
Platelets: 257 10*3/uL (ref 150–400)
RBC: 3.68 MIL/uL — ABNORMAL LOW (ref 4.22–5.81)
RDW: 13.2 % (ref 11.5–15.5)
WBC: 7.5 10*3/uL (ref 4.0–10.5)

## 2014-03-20 LAB — OCCULT BLOOD, POC DEVICE: FECAL OCCULT BLD: NEGATIVE

## 2014-03-20 MED ORDER — PANTOPRAZOLE SODIUM 40 MG PO TBEC
40.0000 mg | DELAYED_RELEASE_TABLET | Freq: Every day | ORAL | Status: DC
  Administered 2014-03-20: 40 mg via ORAL
  Filled 2014-03-20: qty 1

## 2014-03-20 NOTE — Progress Notes (Signed)
Subjective: [name] felt his LLQ abdominal pain is 1-2/10 compared to 10/10 on presentation. His last BM was yesterday morning which he describes as bloody, loose, and filled with mucus. He says that he is hungry and would like to eat. He understands that his symptoms are likely due to ischemic colitis due to compromised blood flow secondary to severe atherosclerotic disease. He has quit smoking at one point without any cessation aid for ~2.5 years but resumed again. His wife smokes as well and is diagnosed with COPD and he thinks that if he quits smoking then she will too.   Objective: Vital signs in last 24 hours: Filed Vitals:   03/20/14 0000 03/20/14 0400 03/20/14 0813 03/20/14 1002  BP: 118/38 109/35 119/38 143/53  Pulse: 54 52 65 60  Temp: 98.1 F (36.7 C) 98.4 F (36.9 C) 98.4 F (36.9 C)   TempSrc: Oral Oral Oral   Resp: 18 14 18    Height:      Weight:      SpO2: 95% 95% 97%    Weight change:   Intake/Output Summary (Last 24 hours) at 03/20/14 1129 Last data filed at 03/20/14 0855  Gross per 24 hour  Intake 3829.17 ml  Output   1525 ml  Net 2304.17 ml   General Appearance:  Alert, cooperative, no distress   Back:  Symmetric, no curvature, ROM normal, no CVA tenderness   Lungs:  Clear to auscultation bilaterally, respirations unlabored   Chest wall:  No tenderness or deformity   Heart:  Regular rate and rhythm, S1 and S2 normal, no murmur, rub or gallops   Abdomen:  Soft, nondistended, no organomegaly. Hyperactive bowel sounds. Still tender to palpation in LLQ.  Extremities:  Extremities normal, atraumatic, no cyanosis or edema   Pulses:  2+ and symmetric all extremities    Lab Results: Results for [name], [name] (MRN [id number]) as of 03/20/2014 11:32  Ref. Range 03/19/2014 23:26  WBC Latest Range: 4.0-10.5 K/uL 7.5  RBC Latest Range: 4.22-5.81 MIL/uL 3.68 (L)  Hemoglobin Latest Range: 13.0-17.0 g/dL 11.3 (L)  HCT Latest Range: 39.0-52.0 % 34.2 (L)  MCV Latest  Range: 78.0-100.0 fL 92.9  MCH Latest Range: 26.0-34.0 pg 30.7  MCHC Latest Range: 30.0-36.0 g/dL 33.0  RDW Latest Range: 11.5-15.5 % 13.2  Platelets Latest Range: 150-400 K/uL 257   Micro Results: Recent Results (from the past 240 hour(s))  MRSA PCR SCREENING     Status: None   Collection Time    03/18/14  8:18 PM      Result Value Ref Range Status   MRSA by PCR NEGATIVE  NEGATIVE Final   Comment:            The GeneXpert MRSA Assay (FDA     approved for NASAL specimens     only), is one component of a     comprehensive MRSA colonization     surveillance program. It is not     intended to diagnose MRSA     infection nor to guide or     monitor treatment for     MRSA infections.   Studies/Results: Dg Abd Acute W/chest  03/18/2014   CLINICAL DATA:  Generalized abdominal pain with bloody diarrhea  EXAM: ACUTE ABDOMEN SERIES (ABDOMEN 2 VIEW & CHEST 1 VIEW)  COMPARISON:  CT abdomen and pelvis March 18, 2014  FINDINGS: PA chest: There is mild bibasilar subsegmental atelectasis. Elsewhere lungs are clear. Heart size and pulmonary vascularity are normal. No adenopathy. Atherosclerotic  change is noted in the aorta.  Spine and left lateral decubitus abdomen: Bowel gas pattern is unremarkable. No obstruction or free air. There is postoperative change in the lower lumbar spine. There is intravenous contrast within the left kidney. There is evidence of the distortion of the left collecting system due to multiple large cysts seen on CT earlier in the day. Contrast is also seen in the urinary bladder.  IMPRESSION: Overall bowel gas pattern unremarkable. No obstruction or free air. Mild subsegmental atelectasis in lung bases. No lung edema or consolidation.   Electronically Signed   By: Lowella Grip M.D.   On: 03/18/2014 15:45   Medications:  Scheduled Meds: . diltiazem  360 mg Oral Daily  . metoprolol succinate  25 mg Oral BID  . pantoprazole  40 mg Oral Daily  . pravastatin  40 mg Oral  QPM   Continuous Infusions: . sodium chloride 100 mL/hr at 03/19/14 2229   PRN Meds:.HYDROmorphone (DILAUDID) injection, topiramate, traZODone Assessment/Plan: Principal Problem:   Hematochezia Active Problems:   CIGARETTE SMOKER   Essential hypertension   CAD (coronary artery disease)   Abdominal pain, acute, left lower quadrant   GERD (gastroesophageal reflux disease)  [name] is a 73 year old gentleman with a history of suspected diverticular bleed 08/15, hemorrhoids, diverticulosis, CAD, hypertension, and GERD who presents with two day duration of worsening LLQ abdominal pain and bloody diarrhea.   1. Hematochezia: Patient noting less abdominal pain and last episode of hematochezia was yesterday morning. Patient reports of an appetite today. Hemoglobin stable at 11.3 today from 11.8 yesterday. Dr. Deatra Ina performed flexible sigmoidsocopy on 10/12, which showed moderate diverticulosis in the sigmoid and descending colon and noted minimal erythema in the mucosa. Biopsy from the procedure shows ischemic changes of the colon and it is now likely that his presentation was due to ischemic colitis. This is likely from compromised blood flow given his extensive atherosclerotic disease from his extensive smoking history. Infectious colitis or diverticulitis is not likely given that there was no leukocytosis and patient has been afebrile. Diverticulosis also less likely as one would expect painless bleeding. CT imaging shows evidence of colonic diverticulosis with superimposed rectosigmoid wall thickening concerning for colitis as an acute on chronic long segment of diverticulitis is less favored. Given this current presentation, patient's prior GI bleed in 01/2013 may have been possibly due to ischemic colitis as well although no workup was pursued at the time as patient's symptoms resolved.  --appreciate [facility]'s involvement  --discontinued IV flagyl and ciprofloxacin --will discharge patient  today given he is tolerating diet well and has not had a blood BM since yesterday morning and Hgb is stable  --continue IV fluids @ 50 mL/h (increased to 150 yesterday) --advance diet today  --PO Protonix  --Zofran PRN nausea  --Dilaudid 0.5 mg PRN pain   2. Sinus Tachycardia: resolved, 10/13. Initial HR at first ED visit was 76 but has been in 100-110 since second ED visit. Likely due to volume loss from hematochezia and recurrent diarrhea and/or setting of infection.  --telemetry  --continue home metoprolol   3. Hypokalemia: resolved, 10/13. 3.3 at first ED visit and return value was 3.0. Likely in the setting of GI losses via diarrhea.  --repeat BMP AM  --continue IV fluids   4. Essential Hypertension: currently normotensive. Initially elevated at 159/81 on presentation likely in the setting of not taking his medications this morning  -continue home diltiazem and metoprolol   5. Peptic Ulcer Disease:  stable  --continue PO Protonix  6. CAD: left heart catheterization done in 2005 showed 30% left main stenosis, an extensively calcified LAD with moderate plaquing, and EF was 60%  --continue home pravastatin and metoprolol   7. GERD: stable, takes Prilosec  --continue Protonix   8. Headaches: patient has history of headaches  --Topamax PRN   9. Tobacco Use: ~60 pack per year history, smokes ~1 pack per day. Extensive history contributing to his ischemic colitis and atherosclerotic disease  --counseled on cessation  --nicotine patch PRN   This is a Careers information officer Note.  The care of the patient was discussed with Dr. Megan Salon and the assessment and plan formulated with their assistance.  Please see their attached note for official documentation of the daily encounter.   LOS: 2 days   Josiah Lobo, Med Student 03/20/2014, 11:29 AM

## 2014-03-20 NOTE — Progress Notes (Signed)
Daily Rounding Note  03/20/2014, 8:24 AM  LOS: 2 days   SUBJECTIVE:       Last prn Dilaudid at 10:45 last evening. Still with pain in LLQ.  No nausea.  No stool or bleeding since enema pre flex sig yesterday morning. Feels better overall.   OBJECTIVE:         Vital signs in last 24 hours:    Temp:  [98 F (36.7 C)-98.9 F (37.2 C)] 98.4 F (36.9 C) (10/13 0813) Pulse Rate:  [52-66] 65 (10/13 0813) Resp:  [12-18] 18 (10/13 0813) BP: (97-140)/(33-63) 119/38 mmHg (10/13 0813) SpO2:  [89 %-98 %] 97 % (10/13 0813) Last BM Date: 03/19/14 Filed Weights   03/18/14 2005  Weight: 57.6 kg (126 lb 15.8 oz)   General: thin, non-toxic, comfortable   Heart: RRR Chest: clear bil Abdomen: soft, ND.  Active BS.  Tender without guarding or rebound in the LLQ: fairly focal.  Extremities: no CCE Neuro/Psych:  Pleasant, oriented x 3.  Fully alert, no gross deficits.   Intake/Output from previous day: 10/12 0701 - 10/13 0700 In: 3829.2 [P.O.:840; I.V.:2389.2; IV IONGEXBMW:413] Out: 1200 [Urine:1200]  Intake/Output this shift:    Lab Results:  Recent Labs  03/18/14 1450 03/19/14 0401 03/19/14 2326  WBC 13.7* 8.7 7.5  HGB 12.8* 11.8* 11.3*  HCT 38.6* 36.0* 34.2*  PLT 299 276 257   BMET  Recent Labs  03/18/14 0158 03/18/14 1450 03/19/14 0401  NA 142 141 143  K 3.3* 3.0* 3.9  CL 105 105 109  CO2 22 24 22   GLUCOSE 122* 113* 107*  BUN 16 13 9   CREATININE 0.74 0.67 0.73  CALCIUM 9.1 9.1 8.9   LFT  Recent Labs  03/18/14 0158 03/18/14 1450 03/19/14 0401  PROT 7.3 7.0 6.3  ALBUMIN 3.7 3.5 3.1*  AST 16 15 22   ALT 18 16 17   ALKPHOS 95 94 79  BILITOT 0.3 0.4 0.5    Studies/Results: Dg Abd Acute W/chest  03/18/2014   CLINICAL DATA:  Generalized abdominal pain with bloody diarrhea  EXAM: ACUTE ABDOMEN SERIES (ABDOMEN 2 VIEW & CHEST 1 VIEW)  COMPARISON:  CT abdomen and pelvis March 18, 2014  FINDINGS: PA  chest: There is mild bibasilar subsegmental atelectasis. Elsewhere lungs are clear. Heart size and pulmonary vascularity are normal. No adenopathy. Atherosclerotic change is noted in the aorta.  Spine and left lateral decubitus abdomen: Bowel gas pattern is unremarkable. No obstruction or free air. There is postoperative change in the lower lumbar spine. There is intravenous contrast within the left kidney. There is evidence of the distortion of the left collecting system due to multiple large cysts seen on CT earlier in the day. Contrast is also seen in the urinary bladder.  IMPRESSION: Overall bowel gas pattern unremarkable. No obstruction or free air. Mild subsegmental atelectasis in lung bases. No lung edema or consolidation.   Electronically Signed   By: Lowella Grip M.D.   On: 03/18/2014 15:45    ASSESMENT:   * Segmental colitis with left abd pain and bloody diarrhea. On empiric cipro/flagyl day 3 for possible infectious etiology.  10/12 flex sig: sigmoid tics, ? Faint mucosal erythema, biopsied.  Path is pending. Rule out ischemic colitis, rule out diverticular bleed.   CT displays aorto-iliac vascular disease and there was ischemia on biopsy of sigmoid in 2009.    * Known diverticulosis with presumed diverticular bleed one month ago.   *  Minor anemia, stable Hgb.   *  Hx hypertension.  Relatively hypotensive during this admission.   *  Hypokalemia.      PLAN   *  Advance diet to low residue.  Stop Cipro/Flagyl.  IVF to 50 ml/hour.  Home when if pain continues to subside and has no further bleeding.  *  Stop smoking. *  If biopsies confirm ischemia, may want to consider adjusting his Metoprolol and Diltiazem doses. *  Would restart prn vicodin per home routine, see if this is effective for LLQ pain control.   Addendum  11:35 AM Pathology of sigmoid: Colon, biopsy, Sigmoid - BENIGN COLONIC MUCOSA WITH FEATURES OF ISCHEMIC CHANGE. - THERE IS NO EVIDENCE OF DYSPLASIA OR  MALIGNANCY.  Azucena Freed  03/20/2014, 8:24 AM Pager: [phone]  GI Attending Note  I have personally taken an interval history, reviewed the chart, and examined the patient.  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  Would consider further workup with CTA and possible evaluation by vascular surgery.  Sandy Salaam. Deatra Ina, MD, [facility] [phone]

## 2014-03-20 NOTE — H&P (Signed)
  I have seen and examined the patient myself, and I have reviewed the note by Josiah Lobo, MS4 and was present during the interview and physical exam.  Please see my separate H&P for additional findings, assessment, and plan.   Signed: Francesca Oman, DO 03/20/2014, 7:17 AM

## 2014-03-20 NOTE — Discharge Summary (Addendum)
Name: [name] MRN: [id number] DOB: 31-Dec-1940 73 y.o. PCP: Rodena Medin, MD  Date of Admission: 03/18/2014  2:09 PM Date of Discharge: 03/20/2014 Attending Physician: Michel Bickers, MD  Discharge Diagnosis:  Principal Problem:   Ischemic colitis Active Problems:   CIGARETTE SMOKER   Essential hypertension   CAD (coronary artery disease)   Abdominal pain, acute, left lower quadrant   Hematochezia   GERD (gastroesophageal reflux disease)  Discharge Medications:   Medication List    STOP taking these medications       ciprofloxacin 500 MG tablet  Commonly known as:  CIPRO     metroNIDAZOLE 500 MG tablet  Commonly known as:  FLAGYL     oxyCODONE-acetaminophen 5-325 MG per tablet  Commonly known as:  PERCOCET/ROXICET      TAKE these medications       dicyclomine 10 MG capsule  Commonly known as:  BENTYL  Take 1 capsule (10 mg total) by mouth 3 (three) times daily.     diltiazem 360 MG 24 hr capsule  Commonly known as:  TIAZAC  Take 360 mg by mouth daily.     HYDROcodone-acetaminophen 5-325 MG per tablet  Commonly known as:  NORCO/VICODIN  Take 1 tablet by mouth every 6 (six) hours as needed for moderate pain.     metoprolol succinate 25 MG 24 hr tablet  Commonly known as:  TOPROL-XL  Take 25 mg by mouth 2 (two) times daily.     multivitamin with minerals Tabs tablet  Take 1 tablet by mouth daily.     omeprazole 20 MG capsule  Commonly known as:  PRILOSEC  Take 20 mg by mouth 2 (two) times daily.     oxymetazoline 0.05 % nasal spray  Commonly known as:  AFRIN  Place 2 sprays into the nose at bedtime as needed for congestion.     pravastatin 40 MG tablet  Commonly known as:  PRAVACHOL  Take 1 tablet (40 mg total) by mouth every evening.     PROBIOTIC DAILY PO  Take 1 tablet by mouth daily.     topiramate 25 MG tablet  Commonly known as:  TOPAMAX  Take 25 mg by mouth daily as needed (for headaches).     traZODone 50 MG tablet  Commonly known  as:  DESYREL  Take 50 mg by mouth at bedtime as needed for sleep.        Disposition and follow-up:   [name] was discharged from [facility] in Good condition.  At the hospital follow up visit please address:  1. Has patient had another episode of bloody diarrhea since discharge?  2. Please consider adjusting the dosage of his diltiazem and metoprolol given this is a recurrent episode of ischemic colitis.   3. GI suggests further workup with CTA and possible evaluation by vascular surgery.    4.  Labs / imaging needed at time of follow-up: None  5.  Pending labs/ test needing follow-up: None  Follow-up Appointments: Dr. Harlene Salts at 11:15 a.m. on 03/23/14  Discharge Instructions: [name], you were admitted into the hospital because of abdominal pain and bloody diarrhea. [facility] performed a flexible sigmoidoscopy to take a look into a part of your colon and did a biopsy. The procedure showed some redness of your colonic mucosa. The biospy showed changes consistent with ischemic bowel disease likely because of compromised blood flow to your colon due to hardening of your arteries that supply the colon.  Please consider the following recommendations:  1) QUIT smoking. If you need help, feel free to call 1-800-QUIT-NOW  2) Follow up with your regular doctor for a hospital follow-up visit  3) If you have bloody diarrhea again, please seek medical attention.   Consultations: Gastroenterology (Dr. Erskine Emery, [facility])  Procedures Performed:  Ct Abdomen Pelvis W Contrast  03/18/2014   CLINICAL DATA:  Acute onset diarrhea 1 day ago, LEFT lower quadrant pain. History of diverticulitis and inflammatory bowel syndrome.  EXAM: CT ABDOMEN AND PELVIS WITH CONTRAST  TECHNIQUE: Multidetector CT imaging of the abdomen and pelvis was performed using the standard protocol following bolus administration of intravenous contrast.  CONTRAST:  100 cc Omnipaque 300.   COMPARISON:  CT of the abdomen and pelvis January 16, 2014 and CT of the abdomen and pelvis August 11, 2013  FINDINGS: LUNG BASES: Included view of the lung bases are clear. Visualized heart and pericardium are unremarkable.  SOLID ORGANS: 2.8 x 3.1 cm LEFT adrenal mass, relatively unchanged from prior imaging, 55 Hounsfield units on the initial phase, 31 Hounsfield units on delayed. The liver, spleen, pancreas, gallbladder and RIGHT adrenal gland are unremarkable.  GASTROINTESTINAL TRACT: Extensive colonic diverticulosis. [place] sigmoid bowel wall thickening of with slight descending colonic pericolonic inflammation. The stomach, small bowel are normal in course and caliber without inflammatory changes. Normal appendix.  KIDNEYS/ URINARY TRACT: Kidneys are orthotopic, demonstrating symmetric enhancement. No nephrolithiasis, hydronephrosis or solid renal masses. Multiple low-density cystic lesions in LEFT kidney largest measuring 9 x 5.9 cm, unchanged. Stable 10 mm cyst or RIGHT interpolar kidney. Low-density cysts versus angiomyolipoma and lower pole RIGHT kidney with additional too small to characterize hypodensities in the kidneys bilaterally. The unopacified ureters are normal in course and caliber. Delayed imaging through the kidneys demonstrates symmetric prompt contrast excretion within the proximal urinary collecting system. Urinary bladder is partially distended and unremarkable.  PERITONEUM/RETROPERITONEUM: No intraperitoneal free fluid nor free air. Aortoiliac vessels are normal in course and caliber, severe calcific atherosclerosis. No lymphadenopathy by CT size criteria. Internal reproductive organs are unremarkable.  SOFT TISSUE/OSSEOUS STRUCTURES: Nonsuspicious. Small fat containing inguinal hernias. Osteopenia. L4-5 and L5-S1 PLIF/posterior decompression with solid interbody fusion.  IMPRESSION: Colonic diverticulosis with superimposed rectosigmoid wall thickening, concerning for colitis (infectious or  inflammatory), acute on chronic long segment of diverticulitis is less favored. No bowel perforation or obstruction.  Similar bilateral renal lesions.  Relatively stable LEFT adrenal mass.   Electronically Signed   By: Elon Alas   On: 03/18/2014 06:05   Dg Abd Acute W/chest  03/18/2014   CLINICAL DATA:  Generalized abdominal pain with bloody diarrhea  EXAM: ACUTE ABDOMEN SERIES (ABDOMEN 2 VIEW & CHEST 1 VIEW)  COMPARISON:  CT abdomen and pelvis March 18, 2014  FINDINGS: PA chest: There is mild bibasilar subsegmental atelectasis. Elsewhere lungs are clear. Heart size and pulmonary vascularity are normal. No adenopathy. Atherosclerotic change is noted in the aorta.  Spine and left lateral decubitus abdomen: Bowel gas pattern is unremarkable. No obstruction or free air. There is postoperative change in the lower lumbar spine. There is intravenous contrast within the left kidney. There is evidence of the distortion of the left collecting system due to multiple large cysts seen on CT earlier in the day. Contrast is also seen in the urinary bladder.  IMPRESSION: Overall bowel gas pattern unremarkable. No obstruction or free air. Mild subsegmental atelectasis in lung bases. No lung edema or consolidation.   Electronically Signed   By: Gwyndolyn Saxon  Jasmine December M.D.   On: 03/18/2014 15:45   Admission HPI: [name] is a 73 year old gentleman with a history of diverticular bleed 08/15, hemorrhoids, diverticulosis, hypertension, GERD, CAD, IBS, prostate cancer s/p resection in 11/2007 who presents with LLQ abdominal pain and bloody diarrhea since Friday evening. Patient describes having intermittent pain, rating at 10/10, since onset throughout all of Saturday and up to presentation at the ED on Sunday early morning. Since Friday evening, he has had diarrhea three times throughout the night as he was working. On Saturday, he cannot recall the number of times but notes as "multiple." Every bowel movement resulted in  bright blood mixed with stool and "mucus." He has had nausea and subsequent hyperventilation but denies vomiting. Patient estimates that he lost about 0.5 cup of blood overall. He also denies any fevers, night sweats, or chills. He also denies taking aspirin or NSAIDs since his last diverticular bleed.   Given the symptoms, he initially presented to the ED on 10/11 at 1:00 a.m. and CT scan had revealed colitis and patient was discharged on Cipro and Flagyl. After he returned home at 6 a.m., he took Vicodin and slept and then had another bloody bowel movement along with pain and returned later that day at 2:00 p.m. This time, he was started in IV Cipro, Flagyl, fluids, and given 0.5 mg Dilaudid. Hemoglobin at first ED visit was 13.1 and was 12.8 at return visit. He did not have time to fill his prescribed antibiotic and did not take of his prescribed medications today. He has been able to eat on Saturday but today has had no appetite and has not taken any of his medications. He denies any drastic weight loss. He denies chest pain, shortness of breath, fatigue, neurological symptoms, or myalgias. He does think this presentation is not as intense as the time he had his diverticular bleed and not due to his hemorrhoids. Patient has not had a bowel movement since this morning.    Hospital Course by problem list:   [name] is a 73 year old gentleman with a history of suspected diverticular bleed 08/15, hemorrhoids, diverticulosis, CAD, hypertension, and GERD who presents with two day duration of worsening LLQ abdominal pain and bloody diarrhea. Please see HPI for further details.   1. 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 He was admitted after his second visit. CT imaging showed evidence of colonic diverticulosis with superimposed rectosigmoid wall thickening  concerning for colitis as an acute on chronic long segment of diverticulitis is less favored. He was started empirically on ciprofloxacin and Flagyl and given Dilaudid for pain and started on IV fluids. On HD#1, patient had one episode of hematochezia and his hemoglobin was 11.8. [facility] was consulted and performed a flexible sigmoidoscopy, which showed moderate diverticulosis in the sigmoid and descending colon and noted minimal erythema in the mucosa. Biopsy from the procedure shows ischemic changes of the colon and it is likely that his presentation was due to ischemic colitis. This is likely from compromised blood flow given his extensive atherosclerotic disease in his aortoiliac vasculature (noted on CT imaging) secondary to his long smoking history.  On HD#2, antibiotics were discontinued because this was not likely infectious colitis. Patient tolerated a full meal diet and did not have any more episodes of hematochezia and felt his abdominal pain improved. Hemoglobin  on day of discharge was 11.3.  GI recommends considering further work-up with CTA and possible evaluation by vascular surgery.   I will forward this discharge summary to [name] PCP and they can discuss CTA and referral to vascular surgery.  2. Sinus Tachycardia: resolved. Initial HR at first ED visit was 76 but ranged in the 100-110 on HD#1.  This was likely due to volume loss from hematochezia and recurrent diarrhea and/or setting of infection. HR was in the 60s-70s range on day of discharge.   3. Hypokalemia: resolved. 3.3 at first ED visit and return value was 3.0. He was given potassium supplementation and repeat K value was 3.9. This was likely in the setting of GI losses via diarrhea.    4. Essential Hypertension:  Initially elevated at 159/81 on presentation likely in the setting of not taking his medications on the morning of admission. He was continued on home diltiazem and metoprolol and her pressures were in the range of  110s-140s/50s.   5. Peptic Ulcer Disease: He was given Protonix while at the hospital and continued on home omeprazole on discharge.   6. CAD: Left heart catheterization done in 2005 showed 30% left main stenosis, an extensively calcified LAD with moderate plaquing, and EF was 60%. He was continued on home pravastatin and metoprolol.  8. Headaches: Patient has history of headaches but did not have any similar symptoms during hospitalization.  9. Tobacco Use: Patient has an extensive nearly 60 pack per year history and currently smokes ~1 pack per day, which is contributing to his ischemic colitis and atherosclerotic disease. We counseled on cessation and that it was likely contributing to his ischemic bowel disease and patient admitted that he understands and will quit smoking.   Discharge Vitals:   BP 134/58  Pulse 73  Temp(Src) 98.3 F (36.8 C) (Oral)  Resp 21  Ht 5\' 6"  (1.676 m)  Wt 57.6 kg (126 lb 15.8 oz)  BMI 20.51 kg/m2  SpO2 98%  Vitals reviewed.  General: resting in bed, in NAD  HEENT: no scleral icterus  Cardiac: RRR, no rubs, murmurs or gallops  Pulm: clear to auscultation bilaterally, no wheezes, rales, or rhonchi  Abd: soft, LLQ mildly TTP, nondistended, BS present  Ext: warm and well perfused, no pedal edema  Neuro: alert and oriented X3, moves all extremities voluntarily  Discharge Labs:  Results for orders placed during the hospital encounter of 03/18/14 (from the past 24 hour(s))  CBC     Status: Abnormal   Collection Time    03/19/14 11:26 PM      Result Value Ref Range   WBC 7.5  4.0 - 10.5 K/uL   RBC 3.68 (*) 4.22 - 5.81 MIL/uL   Hemoglobin 11.3 (*) 13.0 - 17.0 g/dL   HCT 34.2 (*) 39.0 - 52.0 %   MCV 92.9  78.0 - 100.0 fL   MCH 30.7  26.0 - 34.0 pg   MCHC 33.0  30.0 - 36.0 g/dL   RDW 13.2  11.5 - 15.5 %   Platelets 257  150 - 400 K/uL    Signed: Josiah Lobo, Med Student 03/20/2014, 2:33 PM   Duwaine Maxin, DO IMTS,  PGY2 03/20/2014  Services Ordered on Discharge: None Equipment Ordered on Discharge: None

## 2014-03-20 NOTE — Discharge Instructions (Signed)
[name], you were admitted into the hospital because of abdominal pain and bloody diarrhea. [facility] performed a flexible sigmoidoscopy to take a look into a part of your colon and did a biopsy. The procedure some redness of your colonic mucosa. The biospy showed changes consistent with ischemic bowel disease likely because of compromised blood flow to your colon due to hardening of your arteries that supply the colon.   Please consider the following recommendations: 1) QUIT smoking. If you need help, feel free to call 1-800-QUIT-NOW 2) Follow up with your regular doctor for a hospital follow-up visit 3) If you have bloody diarrhea again, please seek medical attention.

## 2014-03-20 NOTE — Progress Notes (Signed)
Discharge instructions reviewed with patient. Patient verbalized understanding. D/C via wheelchair with daughter.

## 2014-03-20 NOTE — Progress Notes (Signed)
  I have seen and examined the patient, and reviewed the daily progress note by Josiah Lobo, MS4 and discussed the care of the patient with them. Please see my progress note from 03/20/2014 for further details regarding assessment and plan.    Signed:  Francesca Oman, DO 03/20/2014, 9:31 PM

## 2014-03-20 NOTE — Progress Notes (Signed)
Subjective: Mr. Fleener was seen and examined this morning.  His abdominal pain has improved to 1-2/10.  His last BM was yesterday morning.  He says it was loose and there was blood visible.  He has not had BM today.  His appetite is good.    Objective: Vital signs in last 24 hours: Filed Vitals:   03/19/14 2000 03/19/14 2229 03/20/14 0000 03/20/14 0400  BP: 122/56 127/62 118/38 109/35  Pulse: 57 58 54 52  Temp: 98.9 F (37.2 C)  98.1 F (36.7 C) 98.4 F (36.9 C)  TempSrc: Oral  Oral Oral  Resp: 18  18 14   Height:      Weight:      SpO2: 96%  95% 95%   Weight change:   Intake/Output Summary (Last 24 hours) at 03/20/14 0719 Last data filed at 03/20/14 0534  Gross per 24 hour  Intake 3829.17 ml  Output   1200 ml  Net 2629.17 ml   Vitals reviewed. General: resting in bed, in NAD; he is able to sit up on side of the bed to eat breakfast Cardiac: RRR, no rubs, murmurs or gallops Pulm: clear to auscultation bilaterally, no wheezes, rales, or rhonchi Abd: soft, LLQ mildly TTP, nondistended, BS present in all four quadrants Ext: warm and well perfused, no pedal edema Neuro: alert and oriented X3, moves all extremities voluntairly  Lab Results: Reviewed and documented in Electronic Record Micro Results: Reviewed and documented in Electronic Record Studies/Results: Reviewed and documented in Electronic Record Medications: I have reviewed the patient's current medications. Scheduled Meds: . ciprofloxacin  400 mg Intravenous Q12H  . diltiazem  360 mg Oral Daily  . Influenza vac split quadrivalent PF  0.5 mL Intramuscular Tomorrow-1000  . metoprolol succinate  25 mg Oral BID  . metronidazole  500 mg Intravenous Q6H  . pantoprazole  40 mg Oral BID  . pravastatin  40 mg Oral QPM   Continuous Infusions: . sodium chloride 100 mL/hr at 03/19/14 2229   PRN Meds:.HYDROmorphone (DILAUDID) injection, topiramate, traZODone  Assessment/Plan: 73 year old man with PMH of  arteriolosclerosis, hx of ischemic colitis, presenting with sharp LLQ pain and hematochezia.   Ischemic colitis: Mr. Roycroft presented with LLQ pain and bloody diarrhea.  Flexible sigmoidoscopy was performed and biopsy revealed benign colonic mucosa with features of ischemic change.  There was no evidence of dysplasia or malignancy.   Antibiotics were stopped given lack of findings consistent with infectious colitis or diverticulitis.   Since he is hemodynamically stable, has not had a BM since yesterday and is tolerating po, he can be safely discharged home today.   - continue PPI and prn vicodin at discharge - advised him to stop smoking  CAD/atherlosclerosis: Had [place] in 2005 with 30% LM stenosis, extensively calcified LAD with mod plaque, EF of 60%. Also has extensive arteriolosclerosis per abd CT.  -Continue home pravastatin and metoprolol  - advised him to stop smoking  Hypokalemia: This has resolved and was likely related to diarrhea.  HTN: stable. Continue home Diltiazem 360mg  24h tab, metoprolol 25mg  24 hr tab.  Peptic ulcer disease: Stable. Continue Protonix.  GERD: Stable, continue PPI (takes Prilosec at home for this).  Headaches: He has hx of headaches and takes topamax PRN which will be continued.   Tobacco use: Has ~60 pack-year smoking hx, currently smokes about 1 pack per day. Did not want nicotine patch.  - advised him to stop smoking  DVT prophylaxis: SCDs  FEN:  NS 158ml/hr  BMET in am Soft diet   Dispo: He is clinically stable for discharge home today with follow-up with his PCP after discharge.   The patient does have a current PCP Rodena Medin, MD) and does not need an [facility] hospital follow-up appointment after discharge.  The patient does not have transportation limitations that hinder transportation to clinic appointments.  .Services Needed at time of discharge: Y = Yes, Blank = No PT:   OT:   RN:   Equipment:   Other:     LOS: 2 days   Francesca Oman,  DO PGY2, IMTS 03/20/2014, 7:19 AM

## 2014-03-29 ENCOUNTER — Telehealth: Payer: Self-pay | Admitting: Gastroenterology

## 2014-03-29 NOTE — Telephone Encounter (Signed)
[facility] Pt that was in the hospital last week with ischemic colitis. Pt states that he has been having LLQ abdominal pain and diarrhea. States he took Imodium last night and the diarrhea slowed some but he just went to the bathroom and passed a lot of mucous, no blood. Pt has taken another Imodium. States he has not eaten anything today is only taking liquids. Pt states he is unable to go to work tonight and wants to know if there is anything else he can do. Dr. Carlean Purl as doc of the day please advise.

## 2014-03-30 MED ORDER — ONDANSETRON HCL 4 MG PO TABS: 4.0000 mg | ORAL_TABLET | Freq: Three times a day (TID) | ORAL | Status: DC | PRN

## 2014-03-30 NOTE — Telephone Encounter (Signed)
Left message for pt to call back.  Spoke with pt and he states he is still not feeling well at all. States he has not eaten anything since Wednesday. He is not having diarrhea not but he is having problems with nausea. Please advise.

## 2014-03-30 NOTE — Telephone Encounter (Signed)
Spoke with pt and he is aware, script sent to pharmacy. 

## 2014-03-30 NOTE — Telephone Encounter (Signed)
He needs to be very well hydrated, every day for the long term Continue Bentyl tid Zofran 4 mg tid prn nausea If not better please call to be seen next week

## 2014-03-30 NOTE — Telephone Encounter (Signed)
Missed this because computer went down. Please call him back and explain - and see how he is and ask Dr. Fuller Plan for further guidance

## 2014-04-11 ENCOUNTER — Encounter: Payer: Self-pay | Admitting: Gastroenterology

## 2014-04-11 ENCOUNTER — Ambulatory Visit (INDEPENDENT_AMBULATORY_CARE_PROVIDER_SITE_OTHER): Payer: Medicare HMO | Admitting: Gastroenterology

## 2014-04-11 VITALS — BP 118/68 | HR 68 | Ht 66.0 in | Wt 132.6 lb

## 2014-04-11 DIAGNOSIS — K559 Vascular disorder of intestine, unspecified: Secondary | ICD-10-CM | POA: Diagnosis not present

## 2014-04-11 DIAGNOSIS — K589 Irritable bowel syndrome without diarrhea: Secondary | ICD-10-CM

## 2014-04-11 NOTE — Patient Instructions (Signed)
Please change the way you are taking your bentyl to before meals  Start taking metamucil 4 pills a day  Please make sure you are well hydrated at all times.  You know you are well hydrated when your urine is very pale  Follow up with Dr. Fuller Plan in 1 year or earlier if you have questions or concerns  Thank you for choosing Dr. Fuller Plan and [facility]

## 2014-04-11 NOTE — Progress Notes (Signed)
    History of Present Illness: This is a 73 year old male with intermittent left lower quadrant pain and intermittent diarrhea. He has sigmoid colon diverticulosis with sigmoid colon wall thickening, irritable bowel syndrome and recent ischemic colitis. During his most recent hospitalization he underwent flexible sigmoidoscopy showing sigmoid colon diverticulosis and essentially normal-appearing sigmoid colon mucosa. Biopsies however were read as showing ischemic colitis.  Current Medications, Allergies, Past Medical History, Past Surgical History, Family History and Social History were reviewed in [employer] record.  Physical Exam: General: Well developed , well nourished, no acute distress Head: Normocephalic and atraumatic Eyes:  sclerae anicteric, EOMI Ears: Normal auditory acuity Mouth: No deformity or lesions Lungs: Clear throughout to auscultation Heart: Regular rate and rhythm; no murmurs, rubs or bruits Abdomen: Soft, non tender and non distended. No masses, hepatosplenomegaly or hernias noted. Normal Bowel sounds Rectal: Musculoskeletal: Symmetrical with no gross deformities  Pulses:  Normal pulses noted Extremities: No clubbing, cyanosis, edema or deformities noted Neurological: Alert oriented x 4, grossly nonfocal Psychological:  Alert and cooperative. Normal mood and affect  Assessment and Recommendations:   1. IBS. Diverticulosis with muscular hypertrophy and sigmoid wall thickening. Recent ischemic colitis. Possible SCAD, although this was not read on pathology. Maintain a long-term high fiber diet with vigorous hydration. Change benzoyl timing to 10 mg before meals. If he has persistent IBS type symptoms consider increasing Bentyl to 20 mg or changing to a different anti-spasmodic. Consider trial of a 5-ASA drug.

## 2014-06-16 ENCOUNTER — Emergency Department (HOSPITAL_COMMUNITY)
Admission: EM | Admit: 2014-06-16 | Discharge: 2014-06-16 | Disposition: A | Discharge status: Home / Self Care | Payer: Medicare HMO | Attending: Emergency Medicine | Admitting: Emergency Medicine

## 2014-06-16 ENCOUNTER — Emergency Department (HOSPITAL_COMMUNITY): Payer: Medicare HMO

## 2014-06-16 ENCOUNTER — Encounter (HOSPITAL_COMMUNITY): Payer: Self-pay | Admitting: Family Medicine

## 2014-06-16 DIAGNOSIS — K219 Gastro-esophageal reflux disease without esophagitis: Secondary | ICD-10-CM | POA: Diagnosis not present

## 2014-06-16 DIAGNOSIS — Z85858 Personal history of malignant neoplasm of other endocrine glands: Secondary | ICD-10-CM | POA: Insufficient documentation

## 2014-06-16 DIAGNOSIS — Z79899 Other long term (current) drug therapy: Secondary | ICD-10-CM | POA: Insufficient documentation

## 2014-06-16 DIAGNOSIS — F329 Major depressive disorder, single episode, unspecified: Secondary | ICD-10-CM | POA: Diagnosis not present

## 2014-06-16 DIAGNOSIS — Z720 Tobacco use: Secondary | ICD-10-CM | POA: Diagnosis not present

## 2014-06-16 DIAGNOSIS — Z8669 Personal history of other diseases of the nervous system and sense organs: Secondary | ICD-10-CM | POA: Insufficient documentation

## 2014-06-16 DIAGNOSIS — N23 Unspecified renal colic: Secondary | ICD-10-CM | POA: Diagnosis not present

## 2014-06-16 DIAGNOSIS — R109 Unspecified abdominal pain: Secondary | ICD-10-CM

## 2014-06-16 DIAGNOSIS — N201 Calculus of ureter: Secondary | ICD-10-CM | POA: Insufficient documentation

## 2014-06-16 DIAGNOSIS — E785 Hyperlipidemia, unspecified: Secondary | ICD-10-CM | POA: Diagnosis not present

## 2014-06-16 DIAGNOSIS — M1990 Unspecified osteoarthritis, unspecified site: Secondary | ICD-10-CM | POA: Diagnosis not present

## 2014-06-16 DIAGNOSIS — I2510 Atherosclerotic heart disease of native coronary artery without angina pectoris: Secondary | ICD-10-CM | POA: Insufficient documentation

## 2014-06-16 DIAGNOSIS — R110 Nausea: Secondary | ICD-10-CM | POA: Insufficient documentation

## 2014-06-16 DIAGNOSIS — Z8546 Personal history of malignant neoplasm of prostate: Secondary | ICD-10-CM | POA: Insufficient documentation

## 2014-06-16 DIAGNOSIS — I10 Essential (primary) hypertension: Secondary | ICD-10-CM | POA: Insufficient documentation

## 2014-06-16 DIAGNOSIS — K589 Irritable bowel syndrome without diarrhea: Secondary | ICD-10-CM | POA: Diagnosis not present

## 2014-06-16 LAB — CBC WITH DIFFERENTIAL/PLATELET
BASOS ABS: 0.1 10*3/uL (ref 0.0–0.1)
Basophils Relative: 1 % (ref 0–1)
Eosinophils Absolute: 0.2 10*3/uL (ref 0.0–0.7)
Eosinophils Relative: 2 % (ref 0–5)
HEMATOCRIT: 37.9 % — AB (ref 39.0–52.0)
Hemoglobin: 12.5 g/dL — ABNORMAL LOW (ref 13.0–17.0)
Lymphocytes Relative: 14 % (ref 12–46)
Lymphs Abs: 1.3 10*3/uL (ref 0.7–4.0)
MCH: 28.6 pg (ref 26.0–34.0)
MCHC: 33 g/dL (ref 30.0–36.0)
MCV: 86.7 fL (ref 78.0–100.0)
Monocytes Absolute: 0.4 10*3/uL (ref 0.1–1.0)
Monocytes Relative: 4 % (ref 3–12)
Neutro Abs: 7.2 10*3/uL (ref 1.7–7.7)
Neutrophils Relative %: 79 % — ABNORMAL HIGH (ref 43–77)
Platelets: 318 10*3/uL (ref 150–400)
RBC: 4.37 MIL/uL (ref 4.22–5.81)
RDW: 15.6 % — ABNORMAL HIGH (ref 11.5–15.5)
WBC: 9.1 10*3/uL (ref 4.0–10.5)

## 2014-06-16 LAB — COMPREHENSIVE METABOLIC PANEL
ALT: 21 U/L (ref 0–53)
AST: 23 U/L (ref 0–37)
Albumin: 3.8 g/dL (ref 3.5–5.2)
Alkaline Phosphatase: 90 U/L (ref 39–117)
Anion gap: 5 (ref 5–15)
BILIRUBIN TOTAL: 0.4 mg/dL (ref 0.3–1.2)
BUN: 16 mg/dL (ref 6–23)
CHLORIDE: 108 meq/L (ref 96–112)
CO2: 26 mmol/L (ref 19–32)
CREATININE: 0.79 mg/dL (ref 0.50–1.35)
Calcium: 9 mg/dL (ref 8.4–10.5)
GFR calc non Af Amer: 86 mL/min — ABNORMAL LOW (ref >90)
Glucose, Bld: 128 mg/dL — ABNORMAL HIGH (ref 70–99)
POTASSIUM: 3.7 mmol/L (ref 3.5–5.1)
Sodium: 139 mmol/L (ref 135–145)
TOTAL PROTEIN: 7.2 g/dL (ref 6.0–8.3)

## 2014-06-16 LAB — URINALYSIS, ROUTINE W REFLEX MICROSCOPIC
Bilirubin Urine: NEGATIVE
Glucose, UA: NEGATIVE mg/dL
Ketones, ur: NEGATIVE mg/dL
Leukocytes, UA: NEGATIVE
Nitrite: NEGATIVE
PROTEIN: NEGATIVE mg/dL
SPECIFIC GRAVITY, URINE: 1.016 (ref 1.005–1.030)
Urobilinogen, UA: 0.2 mg/dL (ref 0.0–1.0)
pH: 7 (ref 5.0–8.0)

## 2014-06-16 LAB — URINE MICROSCOPIC-ADD ON

## 2014-06-16 MED ORDER — ONDANSETRON 4 MG PO TBDP: ORAL_TABLET | ORAL | Status: DC

## 2014-06-16 MED ORDER — MORPHINE SULFATE 4 MG/ML IJ SOLN
4.0000 mg | Freq: Once | INTRAMUSCULAR | Status: AC
  Administered 2014-06-16: 4 mg via INTRAVENOUS
  Filled 2014-06-16: qty 1

## 2014-06-16 MED ORDER — OXYCODONE-ACETAMINOPHEN 5-325 MG PO TABS: 1.0000 | ORAL_TABLET | Freq: Four times a day (QID) | ORAL | Status: DC | PRN

## 2014-06-16 NOTE — ED Notes (Signed)
Pt reports R flank pain, onset today. 10/10 pain upon arrival to ED. Also states nausea and dysuria. Pt is alert and oriented x4.

## 2014-06-16 NOTE — ED Provider Notes (Signed)
CSN: [id number]     Arrival date & time 06/16/14  1440 History   First MD Initiated Contact with Patient 06/16/14 1704     Chief Complaint  Patient presents with  . Flank Pain     (Consider location/radiation/quality/duration/timing/severity/associated sxs/prior Treatment) Patient is a 74 y.o. male presenting with flank pain. The history is provided by the patient and medical records. No language interpreter was used.  Flank Pain Pertinent negatives include no abdominal pain, chest pain, coughing, diaphoresis, fatigue, fever, headaches, nausea, rash or vomiting.     EION TIMBROOK is a 74 y.o. male  with a hx HTN, GERD, PUD, prostate cancer with hx of Prostatectomy, hemorrhoids, CAD, IBS, ischemic colitis, gallstones, kidney stones presents to the Emergency Department complaining of acute onset right flank pain at 11 AM. Patient reports history of kidney stones and pain today is the same as previous. He reports he sees [facility] for management of this. Patient reports last kidney stone was approximately 1 year ago and he has had to have lithotripsy 1 for large stones. Patient reports a history of ischemic colitis and irritable bowel syndrome however he reports that usually presents with abdominal pain and he has no abdominal pain today. He denies scrotal pain, testicular pain, penile pain, penile discharge.   He reports he took his Vicodin today with moderate improvement in his pain but pain persisted putting him to come to the emergency department. Patient reports associated nausea but denies dysuria, hematuria, urgency, frequency.  He denies fevers, chills, nausea, vomiting, abdominal pain.    Past Medical History  Diagnosis Date  . Hyperlipidemia   . Hypertension   . GERD (gastroesophageal reflux disease)   . PUD (peptic ulcer disease) 2008    antral ulcer  . Esophageal stricture 2008    peptic stricture: dilated.   . Prostate cancer 11/2007  . ED (erectile dysfunction)   . Benign  neoplasm of adrenal gland   . Peyronie's disease   . Depression   . Diverticulosis 2002  . Coronary artery disease 2005    2005 Cath with 30% LAD, calcification/plaque of LAD, EF 60%  . Palpitations   . Hemorrhoids 2002  . Arthritis     DDD/ DJD  . Cataract     "beginnings" per pt.  . History of kidney stones 2014    right ureteral lithotripsy 01/2013  . IBS (irritable bowel syndrome)   . Ischemic colitis   . Gallstones    Past Surgical History  Procedure Laterality Date  . Spinal fusion  99,06  . Hernia repair    . Prostatectomy  12/2007    radical.  for prostate CA  . Tonsillectomy and adenoidectomy    . Nasal septum surgery    . Flexible sigmoidoscopy N/A 03/19/2014    Procedure: FLEXIBLE SIGMOIDOSCOPY;  Surgeon: Inda Castle, MD;  Location: [facility];  Service: Endoscopy;  Laterality: N/A;   Family History  Problem Relation Age of Onset  . Colon cancer Maternal Aunt 66  . Heart attack Father 59  . Aneurysm Father   . Lymphoma Maternal Aunt   . Bone cancer Maternal Grandfather    History  Substance Use Topics  . Smoking status: Current Every Day Smoker -- 0.75 packs/day for 53 years    Types: Cigarettes  . Smokeless tobacco: Never Used  . Alcohol Use: No    Review of Systems  Constitutional: Negative for fever, diaphoresis, appetite change, fatigue and unexpected weight change.  HENT: Negative for  mouth sores.   Eyes: Negative for visual disturbance.  Respiratory: Negative for cough, chest tightness, shortness of breath and wheezing.   Cardiovascular: Negative for chest pain.  Gastrointestinal: Negative for nausea, vomiting, abdominal pain, diarrhea and constipation.  Endocrine: Negative for polydipsia, polyphagia and polyuria.  Genitourinary: Positive for flank pain. Negative for dysuria, urgency, frequency, hematuria, decreased urine volume, discharge, penile swelling, scrotal swelling, penile pain and testicular pain.  Musculoskeletal: Negative for back  pain and neck stiffness.  Skin: Negative for rash.  Allergic/Immunologic: Negative for immunocompromised state.  Neurological: Negative for syncope, light-headedness and headaches.  Hematological: Does not bruise/bleed easily.  Psychiatric/Behavioral: Negative for sleep disturbance. The patient is not nervous/anxious.       Allergies  Pseudoephedrine; Tape; Etodolac; and Sulfonamide derivatives  Home Medications   Prior to Admission medications   Medication Sig Start Date End Date Taking? Authorizing Provider  dicyclomine (BENTYL) 10 MG capsule Take 1 capsule (10 mg total) by mouth 3 (three) times daily. 11/16/13  Yes Ladene Artist, MD  diltiazem [facility]) 360 MG 24 hr capsule Take 360 mg by mouth daily.   Yes Historical Provider, MD  HYDROcodone-acetaminophen (NORCO/VICODIN) 5-325 MG per tablet Take 1 tablet by mouth every 6 (six) hours as needed for moderate pain.   Yes Historical Provider, MD  metoprolol succinate (TOPROL-XL) 25 MG 24 hr tablet Take 25 mg by mouth 2 (two) times daily.   Yes Historical Provider, MD  Multiple Vitamin (MULTIVITAMIN WITH MINERALS) TABS tablet Take 1 tablet by mouth daily.   Yes Historical Provider, MD  omeprazole (PRILOSEC) 20 MG capsule Take 20 mg by mouth 2 (two) times daily.   Yes Historical Provider, MD  oxymetazoline (AFRIN) 0.05 % nasal spray Place 2 sprays into the nose at bedtime as needed for congestion.   Yes Historical Provider, MD  pravastatin (PRAVACHOL) 40 MG tablet Take 1 tablet (40 mg total) by mouth every evening. 08/21/13  Yes Larey Dresser, MD  Probiotic Product (PROBIOTIC DAILY PO) Take 1 tablet by mouth daily.   Yes Historical Provider, MD  traZODone (DESYREL) 50 MG tablet Take 50 mg by mouth at bedtime as needed for sleep.   Yes Historical Provider, MD  ondansetron (ZOFRAN ODT) 4 MG disintegrating tablet 4mg  ODT q4 hours prn nausea/vomit 06/16/14   Kanoelani Dobies, PA-C  ondansetron (ZOFRAN) 4 MG tablet Take 1 tablet (4 mg total)  by mouth every 8 (eight) hours as needed for nausea or vomiting. 03/30/14   Ladene Artist, MD  oxyCODONE-acetaminophen (PERCOCET) 5-325 MG per tablet Take 1-2 tablets by mouth every 6 (six) hours as needed. 06/16/14   Gottfried Standish, PA-C  topiramate (TOPAMAX) 25 MG tablet Take 25 mg by mouth daily as needed (for headaches).     Historical Provider, MD   BP 155/56 mmHg  Pulse 55  Temp(Src) 97.6 F (36.4 C)  Resp 18  SpO2 95% Physical Exam  Constitutional: He appears well-developed and well-nourished. No distress.  HENT:  Head: Normocephalic and atraumatic.  Mouth/Throat: Oropharynx is clear and moist. No oropharyngeal exudate.  Cardiovascular: Normal rate, regular rhythm, normal heart sounds and intact distal pulses.   No murmur heard. Pulmonary/Chest: Effort normal and breath sounds normal. No respiratory distress. He has no wheezes.  Abdominal: Soft. Bowel sounds are normal. He exhibits no distension and no mass. There is no tenderness. There is CVA tenderness (right). There is no rebound and no guarding.  Musculoskeletal: Normal range of motion. He exhibits no edema.  Neurological: He  is alert.  Skin: Skin is warm and dry. No rash noted. He is not diaphoretic.  Psychiatric: He has a normal mood and affect.  Nursing note and vitals reviewed.   ED Course  Procedures (including critical care time) Labs Review Labs Reviewed  CBC WITH DIFFERENTIAL - Abnormal; Notable for the following:    Hemoglobin 12.5 (*)    HCT 37.9 (*)    RDW 15.6 (*)    Neutrophils Relative % 79 (*)    All other components within normal limits  COMPREHENSIVE METABOLIC PANEL - Abnormal; Notable for the following:    Glucose, Bld 128 (*)    GFR calc non Af Amer 86 (*)    All other components within normal limits  URINALYSIS, ROUTINE W REFLEX MICROSCOPIC - Abnormal; Notable for the following:    Hgb urine dipstick MODERATE (*)    All other components within normal limits  URINE MICROSCOPIC-ADD ON     Imaging Review Ct Renal Stone Study  06/16/2014   CLINICAL DATA:  Right flank pain started today.  Mild nausea.  EXAM: CT ABDOMEN AND PELVIS WITHOUT CONTRAST  TECHNIQUE: Multidetector CT imaging of the abdomen and pelvis was performed following the standard protocol without IV contrast.  COMPARISON:  CT 03/18/2014  FINDINGS: Lower chest:  Lung bases are clear.  Hepatobiliary: No focal hepatic lesion on this noncontrast exam. Normal gallbladder.  Pancreas: Pancreas is normal. No ductal dilatation. No pancreatic inflammation.  Spleen: Normal spleen.  Adrenals/urinary tract: Low-density lesion of the left adrenal gland measures 2.8 cm, unchanged from prior, has low attenuation consistent with a benign adenoma. Right adrenal glands normal.  There is a large low-attenuation cystic-appearing lesion within the mid left kidney measuring up to the 8.7 cm which is not changed from prior. There is a 1 mm calculus in lower pole the right kidney.  There is mild perinephric stranding on the right which is new from comparison exam. There is a new calculus within the course of the right ureter measuring 4 mm on image 53, series 2. This calculus is at the S1 vertebral body level calculus is not clearly evident on the CT topogram. No left ureterolithiasis. No bladder calculi.  Stomach/Bowel: The stomach, small bowel, appendix, cecum normal. The colon has multiple diverticula throughout its course. The densest concentration of diverticula is within the sigmoid region. There is no acute inflammation.  Vascular/Lymphatic: Abdominal aorta is normal caliber with atherosclerotic calcification. There is no retroperitoneal or periportal lymphadenopathy. No pelvic lymphadenopathy.  Reproductive: Post prostatectomy. The bilateral inguinal hernias which are fat filled. No pelvic lymphadenopathy.  Musculoskeletal: No aggressive osseous lesion. Posterior lumbar sacral fusion with decompression noted.  IMPRESSION: 1. Obstructing calculus  within the distal right ureter at the S1 level. 2. Tiny nonobstructing calculus within the right kidney. 3. Stable left adrenal adenoma. 4. Extensive colonic diverticulosis without evidence of acute diverticulitis. 5. Atherosclerotic calcification aorta.   Electronically Signed   By: Suzy Bouchard M.D.   On: 06/16/2014 19:05     EKG Interpretation None      MDM   Final diagnoses:  Right flank pain  Renal colic  Right ureteral stone   Marice Potter presents with right flank pain consistent with previous episodes of renal colic.  Patient is well appearing and resting comfortably though he requests further pain control. Labs reassuring without evidence of AKI.  Will obtain a CT renal to assess for stones and hydronephrosis. Will give morphine for pain control.  7:37 PM CT  with 56mm obstruction stone in the right ureter, no significant/severe hydronephrosis.  Pt with complete pain relief. He wishes for discharge home. Patient follows with Dr. Jeffie Pollock at [facility] and reports he will attempt to see him next week.  Patient instructed to return for fevers, intractable vomiting, worsening pain.  Patient tolerating by mouth here in the emergency department and noted vomiting here.  I have personally reviewed patient's vitals, nursing note and any pertinent labs or imaging.  I performed an undressed physical exam.    It has been determined that no acute conditions requiring further emergency intervention are present at this time. The patient/guardian have been advised of the diagnosis and plan. I reviewed all labs and imaging including any potential incidental findings. We have discussed signs and symptoms that warrant return to the ED and they are listed in the discharge instructions.    Vital signs are stable at discharge.   BP 155/56 mmHg  Pulse 55  Temp(Src) 97.6 F (36.4 C)  Resp 18  SpO2 95%  The patient was discussed with and seen by Dr. Alvino Chapel who agrees with the treatment  plan.    Abigail Butts, PA-C 06/16/14 [place] Alvino Chapel, MD 06/16/14 [phone]

## 2014-06-16 NOTE — Discharge Instructions (Signed)
1. Medications: percocet, zofran, usual home medications - you may take 1-2 Percocet every 6 hours as needed for pain, do not combine with Tylenol. 2. Treatment: rest, drink plenty of fluids,  3. Follow Up: Please followup with Dr. Roni Bread in 2 days for discussion of your diagnoses and further evaluation after today's visit; if you do not have a primary care doctor use the resource guide provided to find one; Please return to the ER for fevers, persistent vomiting, intractable pain or other concerning symptoms.

## 2014-06-16 NOTE — ED Notes (Signed)
Pt sts right flank pain that started today. sts some nausea.

## 2014-06-16 NOTE — ED Notes (Signed)
Pt remains monitored by blood pressure and pulse ox. Pts family remains at bedside.  

## 2014-07-09 ENCOUNTER — Telehealth: Payer: Self-pay | Admitting: Cardiology

## 2014-07-09 DIAGNOSIS — R011 Cardiac murmur, unspecified: Secondary | ICD-10-CM

## 2014-07-09 NOTE — Telephone Encounter (Signed)
Request for surgical clearance:        1. What type of surgery is being performed? ureteUreteroscopystone extraction     2.  When is this surgery scheduled? Not scheduled.. Looking towards next week  3. Are there any medications that need to be held prior to surgery and how long?Not that they know of   4. Name of physician performing surgery?  Dr. Jeffie Pollock  5.  What is your office phone and fax number? Fax [phone] (Will fax form as well for clearance)

## 2014-07-09 NOTE — Telephone Encounter (Signed)
Will forward to Dr McLean for review. 

## 2014-07-09 NOTE — Telephone Encounter (Signed)
OK for surgery if no new symptoms

## 2014-07-10 ENCOUNTER — Encounter: Payer: Self-pay | Admitting: *Deleted

## 2014-07-10 NOTE — Telephone Encounter (Signed)
Pt states no new cardiac symptoms.  Pt agreed to echo prior to surgery, will forward to [facility] to contact pt.  Pt advised if echo [facility] he can proceed with surgery.

## 2014-07-10 NOTE — Telephone Encounter (Signed)
-----   Message from Larey Dresser, MD sent at 07/09/2014  9:49 PM EST ----- I'll arrange an echo for him.  As long as echo is ok and he has no new symptoms, ok for procedure.   ----- Message -----    From: Malka So, MD    Sent: 07/09/2014   2:45 PM      To: Larey Dresser, MD  [name],     I saw [name] today for a right ureteral stone that needs ureteroscopy. I noted about a grade 2 systolic murmur on my exam today.  You last saw him in 3/15 and I don't see where he had a murmur at that time and there were no comments about valvular heart disease.   He remains asymptomatic, but I just wanted to make sure we didn't need to get him seen first.   I have asked my scheduler to get clearance from you for the procedure.  Thanks,  Jenny Reichmann

## 2014-07-11 ENCOUNTER — Other Ambulatory Visit (HOSPITAL_COMMUNITY): Payer: Commercial Managed Care - HMO

## 2014-07-12 ENCOUNTER — Other Ambulatory Visit (HOSPITAL_COMMUNITY): Payer: Commercial Managed Care - HMO

## 2014-07-13 ENCOUNTER — Ambulatory Visit (HOSPITAL_COMMUNITY): Payer: Medicare HMO | Attending: Cardiology | Admitting: Radiology

## 2014-07-13 DIAGNOSIS — I10 Essential (primary) hypertension: Secondary | ICD-10-CM | POA: Diagnosis not present

## 2014-07-13 DIAGNOSIS — Z720 Tobacco use: Secondary | ICD-10-CM | POA: Diagnosis not present

## 2014-07-13 DIAGNOSIS — R011 Cardiac murmur, unspecified: Secondary | ICD-10-CM | POA: Diagnosis not present

## 2014-07-13 HISTORY — PX: TRANSTHORACIC ECHOCARDIOGRAM: SHX275

## 2014-07-13 NOTE — Progress Notes (Signed)
Echocardiogram performed.  

## 2014-07-16 ENCOUNTER — Telehealth: Payer: Self-pay | Admitting: Cardiology

## 2014-07-16 NOTE — Telephone Encounter (Signed)
New message     Patient calling returning call back to nurse.

## 2014-07-16 NOTE — Telephone Encounter (Signed)
Received request from Nurse fax box, documents faxed for surgical clearance. To: [facility] Fax number: [phone] Attention: 2.8.16/km

## 2014-07-16 NOTE — Telephone Encounter (Signed)
Spoke with patient about recent echo results 

## 2014-07-17 ENCOUNTER — Other Ambulatory Visit: Payer: Self-pay | Admitting: Urology

## 2014-07-17 ENCOUNTER — Encounter (HOSPITAL_BASED_OUTPATIENT_CLINIC_OR_DEPARTMENT_OTHER): Payer: Self-pay | Admitting: *Deleted

## 2014-07-17 NOTE — Progress Notes (Signed)
NPO AFTER MN. ARRIVE AT 6015. NEEDS ISTAT. CURRENT EKG IN CHART AND EPIC. WILL TAKE DILTIAZEM, PRILOSEC, AND TOPROL AM DOS W/ SIPS OF WATER AND IF NEEDED TAKE PAIN RX.

## 2014-07-18 NOTE — H&P (Signed)
Active Problems Problems  1. Calculus of right ureter (N20.1) 2. Dysuria (R30.0) 3. Erectile dysfunction due to arterial insufficiency (N52.01) 4. History of prostate cancer (Z85.46) 5. History of prostate cancer (Z85.46) 6. Increased urinary frequency (R35.0) 7. Lumbago (M54.5) 8. History of Malignant Prostatic Neoplasm T2a 9. Muscle weakness (M62.81) 10. Nocturia (R35.1) 11. Other lack of coordination (R27.8) 12. Peyronie's disease (N48.6) 13. Pyuria (N39.0) 14. Urge incontinence of urine (N39.41) 15. Weak urinary stream (R39.12)  History of Present Illness Billal returns today in f/u for his 78mm right mid ureteral stone. He had a bad day yesterday with severe pain and had to double up on the vicodin and had to take an antiemetic because of the nausea.  He has no voiding complaints. He is feeling better today but had to take a pill this morning. He hasn't seen anything pass.   Past Medical History Problems  1. History of Abdominal pain, RLQ (right lower quadrant) (R10.31) 2. History of Acute cystitis without hematuria (N30.00) 3. History of Arthritis 4. History of Chronic ischemic colitis (K55.1) 5. History of Elevated prostate specific antigen (PSA) (R97.2) 6. History of balanitis (Z87.438) 7. History of cardiac disorder (Z86.79) 8. History of depression (Z86.59) 9. History of diverticulitis of colon (Z87.19) 10. History of esophageal reflux (Z87.19) 11. History of hypercholesterolemia (Z86.39) 12. History of hypertension (Z86.79) 13. History of irritable bowel syndrome (Z87.19) 14. History of Incomplete bladder emptying (R33.9) 15. History of Kidney stone on right side (N20.0) 16. History of Male stress incontinence (N39.3) 17. History of Malignant Prostatic Neoplasm T2a 18. History of Nodular prostate with lower urinary tract symptoms (N40.3) 19. History of Peptic Ulcer 20. History of Rotator Cuff Tendonitis  Surgical History Problems  1. History of Coronary  Angiography, W/O Concomitant Left Heart Catheteriz 2. History of Lithotripsy 3. History of Lumbar Vertebral Fusion 4. History of Operation For Correction Of Male Urinary Incontinence 5. History of Prostatect Retropubic Radical W/ Nerve Sparing Laparoscopic 6. History of Tonsillectomy 7. History of Umbilical Hernia Repair  Current Meds 1. Dicyclomine HCl - 10 MG Oral Capsule;  Therapy: [id number] to Recorded 2. Hydrocodone-Acetaminophen 5-325 MG Oral Tablet;  Therapy: (Recorded:21Jan2015) to Recorded 3. Metoprolol Tartrate TABS;  Therapy: (Recorded:26May2010) to Recorded 4. Multi-Vitamin TABS;  Therapy: (Recorded:17Apr2009) to Recorded 5. Omeprazole 20 MG Oral Capsule Delayed Release;  Therapy: [id number] to Recorded 6. Taztia XT 360 MG Oral Capsule Extended Release 24 Hour;  Therapy: (Recorded:21Jan2015) to Recorded 7. TraZODone HCl - 50 MG Oral Tablet;  Therapy: 08Aug2013 to Recorded  Allergies Medication  1. PredniSONE TABS 2. Sudafed TABS 3. Sulfa Drugs  Family History Problems  1. Family history of Acute Myocardial Infarction : Father 2. Family history of Aortic Aneurysm : Father 3. Family history of Death In The Family Father : Father   AneurysmAge 44 4. Family history of Death In The Family Mother : Mother   EmphysemaAge 53 5. Family history of Emphysema : Mother 6. Family history of Family Health Status Number Of Children   One son and one daughter  Social History Problems  1. Activities Of Daily Living 2. Denied: Alcohol Use 3. Denied: Caffeine Use 4. Current every day smoker (F17.200) 5. Exercise Habits   Daily walking 6. Living Independently With Spouse   He is her caregiver 7. Marital History - Currently Married 8. Retired From Work 34. Self-reliant In Usual Daily Activities 10. Tobacco Use   Smokes 1 ppd for 23yrs  past and social history reviewed and updated.   Review  of Systems  Genitourinary: no hematuria.  Constitutional: no fever.   Cardiovascular: no chest pain.  Respiratory: no shortness of breath.    Vitals Vital Signs [Data Includes: Last 1 Day]  Recorded: [id number] 01:18PM  Blood Pressure: 139 / 66 Temperature: 97.1 F Heart Rate: 52  Physical Exam Pulmonary: No respiratory distress and normal respiratory rhythm and effort.  Cardiovascular: Heart rate and rhythm are normal . No peripheral edema. A grade 2/6 systolic murmur was heard.    Results/Data Urine [Data Includes: Last 1 Day]   [id number]  COLOR YELLOW   APPEARANCE CLEAR   SPECIFIC GRAVITY 1.025   pH 5.0   GLUCOSE NEG mg/dL  BILIRUBIN NEG   KETONE TRACE mg/dL  BLOOD NEG   PROTEIN TRACE mg/dL  UROBILINOGEN 0.2 mg/dL  NITRITE NEG   LEUKOCYTE ESTERASE NEG    The following images/tracing/specimen were independently visualized:  CT urogram today shows no significant change in the position of the right mid ureteral stone but there is increased hydronephrosis.  The following clinical lab reports were reviewed:  UA reviewed.    Assessment Assessed  1. Calculus of right ureter (N20.1) 2. Heart murmur (R01.1)  The hasn't moved and he remains symptomatic and will need treatment.  He has a probable aortic murmur on exam today which is new on my exam and not noted at his last cardiology visit.   Plan Calculus of right ureter  1. AU CT-STONE PROTOCOL; Status:In Progress - Specimen/Data Collected;   Done:  [id number] 12:00AM Health Maintenance  2. UA With REFLEX; [Do Not Release]; Status:Complete;   Done: [id number] 01:02PM  I am going to set him up for ureteroscopic stone extraction with possible laser and stenting. I reviewed the risks of the procedure including bleeding, infection, ureteral injury, need for a stent and secondary procedures, thrombotic events and anesthetic risks. I will get him cleared by Dr. Aundra Dubin because of the murmur found on exam.   Discussion/Summary CC: Dr. Loralie Champagne.

## 2014-07-19 ENCOUNTER — Ambulatory Visit (HOSPITAL_BASED_OUTPATIENT_CLINIC_OR_DEPARTMENT_OTHER): Payer: Medicare HMO | Admitting: Anesthesiology

## 2014-07-19 ENCOUNTER — Ambulatory Visit (HOSPITAL_BASED_OUTPATIENT_CLINIC_OR_DEPARTMENT_OTHER)
Admission: RE | Admit: 2014-07-19 | Discharge: 2014-07-19 | Disposition: A | Discharge status: Home / Self Care | Payer: Medicare HMO | Source: Ambulatory Visit | Attending: Urology | Admitting: Urology

## 2014-07-19 ENCOUNTER — Encounter (HOSPITAL_BASED_OUTPATIENT_CLINIC_OR_DEPARTMENT_OTHER): Payer: Self-pay | Admitting: *Deleted

## 2014-07-19 ENCOUNTER — Encounter (HOSPITAL_BASED_OUTPATIENT_CLINIC_OR_DEPARTMENT_OTHER)
Admission: RE | Disposition: A | Discharge status: Home / Self Care | Payer: Self-pay | Source: Ambulatory Visit | Attending: Urology

## 2014-07-19 DIAGNOSIS — Z888 Allergy status to other drugs, medicaments and biological substances status: Secondary | ICD-10-CM | POA: Insufficient documentation

## 2014-07-19 DIAGNOSIS — Z79899 Other long term (current) drug therapy: Secondary | ICD-10-CM | POA: Insufficient documentation

## 2014-07-19 DIAGNOSIS — I10 Essential (primary) hypertension: Secondary | ICD-10-CM | POA: Insufficient documentation

## 2014-07-19 DIAGNOSIS — N201 Calculus of ureter: Secondary | ICD-10-CM | POA: Insufficient documentation

## 2014-07-19 DIAGNOSIS — Z8546 Personal history of malignant neoplasm of prostate: Secondary | ICD-10-CM | POA: Diagnosis not present

## 2014-07-19 DIAGNOSIS — F329 Major depressive disorder, single episode, unspecified: Secondary | ICD-10-CM | POA: Diagnosis not present

## 2014-07-19 DIAGNOSIS — I2510 Atherosclerotic heart disease of native coronary artery without angina pectoris: Secondary | ICD-10-CM | POA: Diagnosis not present

## 2014-07-19 DIAGNOSIS — Z882 Allergy status to sulfonamides status: Secondary | ICD-10-CM | POA: Diagnosis not present

## 2014-07-19 DIAGNOSIS — R011 Cardiac murmur, unspecified: Secondary | ICD-10-CM | POA: Insufficient documentation

## 2014-07-19 DIAGNOSIS — Z720 Tobacco use: Secondary | ICD-10-CM | POA: Insufficient documentation

## 2014-07-19 DIAGNOSIS — E780 Pure hypercholesterolemia: Secondary | ICD-10-CM | POA: Diagnosis not present

## 2014-07-19 DIAGNOSIS — K219 Gastro-esophageal reflux disease without esophagitis: Secondary | ICD-10-CM | POA: Diagnosis not present

## 2014-07-19 HISTORY — DX: Other intervertebral disc degeneration, lumbosacral region without mention of lumbar back pain or lower extremity pain: M51.379

## 2014-07-19 HISTORY — DX: Calculus of ureter: N20.1

## 2014-07-19 HISTORY — PX: CYSTOSCOPY W/ RETROGRADES: SHX1426

## 2014-07-19 HISTORY — DX: Unspecified rotator cuff tear or rupture of left shoulder, not specified as traumatic: M75.102

## 2014-07-19 HISTORY — DX: Diverticulosis of large intestine without perforation or abscess without bleeding: K57.30

## 2014-07-19 HISTORY — DX: Benign neoplasm of left adrenal gland: D35.02

## 2014-07-19 HISTORY — DX: Other specific arthropathies, not elsewhere classified, left shoulder: M12.812

## 2014-07-19 HISTORY — PX: CYSTOSCOPY/RETROGRADE/URETEROSCOPY/STONE EXTRACTION WITH BASKET: SHX5317

## 2014-07-19 HISTORY — DX: Other specific arthropathies, not elsewhere classified, right shoulder: M12.811

## 2014-07-19 HISTORY — DX: Unspecified rotator cuff tear or rupture of right shoulder, not specified as traumatic: M75.101

## 2014-07-19 HISTORY — DX: Personal history of other diseases of the digestive system: Z87.19

## 2014-07-19 HISTORY — DX: Other specified postprocedural states: Z98.890

## 2014-07-19 HISTORY — DX: Other intervertebral disc degeneration, lumbosacral region: M51.37

## 2014-07-19 HISTORY — DX: Dermatitis, unspecified: L30.9

## 2014-07-19 HISTORY — DX: Personal history of malignant neoplasm of prostate: Z85.46

## 2014-07-19 HISTORY — DX: Personal history of peptic ulcer disease: Z87.11

## 2014-07-19 HISTORY — DX: Atrial premature depolarization: I49.1

## 2014-07-19 HISTORY — DX: Unspecified osteoarthritis, unspecified site: M19.90

## 2014-07-19 LAB — POCT I-STAT 4, (NA,K, GLUC, HGB,HCT)
GLUCOSE: 98 mg/dL (ref 70–99)
HCT: 40 % (ref 39.0–52.0)
Hemoglobin: 13.6 g/dL (ref 13.0–17.0)
Potassium: 3.8 mmol/L (ref 3.5–5.1)
Sodium: 144 mmol/L (ref 135–145)

## 2014-07-19 SURGERY — CYSTOSCOPY, WITH CALCULUS REMOVAL USING BASKET
Anesthesia: General | Site: Ureter | Laterality: Right

## 2014-07-19 MED ORDER — ONDANSETRON HCL 4 MG/2ML IJ SOLN
INTRAMUSCULAR | Status: DC | PRN
  Administered 2014-07-19: 4 mg via INTRAVENOUS

## 2014-07-19 MED ORDER — MIDAZOLAM HCL 5 MG/5ML IJ SOLN
INTRAMUSCULAR | Status: DC | PRN
  Administered 2014-07-19: 2 mg via INTRAVENOUS

## 2014-07-19 MED ORDER — ACETAMINOPHEN 10 MG/ML IV SOLN
INTRAVENOUS | Status: DC | PRN
  Administered 2014-07-19: 1000 mg via INTRAVENOUS

## 2014-07-19 MED ORDER — FENTANYL CITRATE 0.05 MG/ML IJ SOLN
25.0000 ug | INTRAMUSCULAR | Status: DC | PRN
  Filled 2014-07-19: qty 1

## 2014-07-19 MED ORDER — DEXAMETHASONE SODIUM PHOSPHATE 4 MG/ML IJ SOLN
INTRAMUSCULAR | Status: DC | PRN
  Administered 2014-07-19: 10 mg via INTRAVENOUS

## 2014-07-19 MED ORDER — HYDROCODONE-ACETAMINOPHEN 5-325 MG PO TABS
1.0000 | ORAL_TABLET | Freq: Four times a day (QID) | ORAL | Status: DC | PRN
Start: 2014-07-19 — End: 2015-08-07

## 2014-07-19 MED ORDER — KETOROLAC TROMETHAMINE 30 MG/ML IJ SOLN
INTRAMUSCULAR | Status: DC | PRN
  Administered 2014-07-19: 30 mg via INTRAVENOUS

## 2014-07-19 MED ORDER — MIDAZOLAM HCL 2 MG/2ML IJ SOLN
INTRAMUSCULAR | Status: AC
  Filled 2014-07-19: qty 2

## 2014-07-19 MED ORDER — FENTANYL CITRATE 0.05 MG/ML IJ SOLN
INTRAMUSCULAR | Status: AC
  Filled 2014-07-19: qty 4

## 2014-07-19 MED ORDER — PROMETHAZINE HCL 25 MG/ML IJ SOLN
6.2500 mg | INTRAMUSCULAR | Status: DC | PRN
  Filled 2014-07-19: qty 1

## 2014-07-19 MED ORDER — IOHEXOL 350 MG/ML SOLN
INTRAVENOUS | Status: DC | PRN
  Administered 2014-07-19: 5 mL

## 2014-07-19 MED ORDER — CIPROFLOXACIN IN D5W 400 MG/200ML IV SOLN
INTRAVENOUS | Status: AC
  Filled 2014-07-19: qty 200

## 2014-07-19 MED ORDER — LACTATED RINGERS IV SOLN
INTRAVENOUS | Status: DC
  Administered 2014-07-19: 11:00:00 via INTRAVENOUS
  Filled 2014-07-19: qty 1000

## 2014-07-19 MED ORDER — PHENYLEPHRINE HCL 10 MG/ML IJ SOLN
INTRAMUSCULAR | Status: DC | PRN
  Administered 2014-07-19: 120 ug via INTRAVENOUS

## 2014-07-19 MED ORDER — LIDOCAINE HCL (CARDIAC) 20 MG/ML IV SOLN
INTRAVENOUS | Status: DC | PRN
  Administered 2014-07-19: 50 mg via INTRAVENOUS

## 2014-07-19 MED ORDER — SODIUM CHLORIDE 0.9 % IR SOLN
Status: DC | PRN
  Administered 2014-07-19: 4000 mL

## 2014-07-19 MED ORDER — CIPROFLOXACIN IN D5W 400 MG/200ML IV SOLN
400.0000 mg | INTRAVENOUS | Status: AC
  Administered 2014-07-19: 400 mg via INTRAVENOUS
  Filled 2014-07-19: qty 200

## 2014-07-19 MED ORDER — KETOROLAC TROMETHAMINE 30 MG/ML IJ SOLN
30.0000 mg | Freq: Once | INTRAMUSCULAR | Status: DC | PRN
Start: 2014-07-19 — End: 2014-07-19
  Filled 2014-07-19: qty 1

## 2014-07-19 MED ORDER — PROPOFOL 10 MG/ML IV BOLUS
INTRAVENOUS | Status: DC | PRN
  Administered 2014-07-19: 150 mg via INTRAVENOUS

## 2014-07-19 SURGICAL SUPPLY — 39 items
BAG DRAIN URO-CYSTO SKYTR STRL (DRAIN) ×3 IMPLANT
BAG DRN UROCATH (DRAIN) ×1
BASKET LASER NITINOL 1.9FR (BASKET) IMPLANT
BASKET ZERO TIP NITINOL 2.4FR (BASKET) ×2 IMPLANT
BSKT STON RTRVL 120 1.9FR (BASKET)
BSKT STON RTRVL ZERO TP 2.4FR (BASKET) ×1
CANISTER SUCT LVC 12 LTR MEDI- (MISCELLANEOUS) ×2 IMPLANT
CATH URET 5FR 28IN CONE TIP (BALLOONS)
CATH URET 5FR 28IN OPEN ENDED (CATHETERS) IMPLANT
CATH URET 5FR 70CM CONE TIP (BALLOONS) IMPLANT
CLOTH BEACON ORANGE TIMEOUT ST (SAFETY) ×3 IMPLANT
DRAPE CAMERA CLOSED 9X96 (DRAPES) ×3 IMPLANT
ELECT REM PT RETURN 9FT ADLT (ELECTROSURGICAL)
ELECTRODE REM PT RTRN 9FT ADLT (ELECTROSURGICAL) IMPLANT
FIBER LASER FLEXIVA 1000 (UROLOGICAL SUPPLIES) IMPLANT
FIBER LASER FLEXIVA 200 (UROLOGICAL SUPPLIES) IMPLANT
FIBER LASER FLEXIVA 365 (UROLOGICAL SUPPLIES) IMPLANT
FIBER LASER FLEXIVA 550 (UROLOGICAL SUPPLIES) IMPLANT
GLOVE BIO SURGEON STRL SZ 6.5 (GLOVE) ×1 IMPLANT
GLOVE BIO SURGEONS STRL SZ 6.5 (GLOVE) ×1
GLOVE BIOGEL PI IND STRL 6.5 (GLOVE) IMPLANT
GLOVE BIOGEL PI INDICATOR 6.5 (GLOVE) ×2
GLOVE SURG SS PI 8.0 STRL IVOR (GLOVE) ×3 IMPLANT
GOWN PREVENTION PLUS LG XLONG (DISPOSABLE) IMPLANT
GOWN STRL REIN XL XLG (GOWN DISPOSABLE) ×1 IMPLANT
GOWN STRL REUS W/TWL LRG LVL3 (GOWN DISPOSABLE) ×2 IMPLANT
GOWN STRL REUS W/TWL XL LVL3 (GOWN DISPOSABLE) ×2 IMPLANT
GUIDEWIRE 0.038 PTFE COATED (WIRE) IMPLANT
GUIDEWIRE ANG ZIPWIRE 038X150 (WIRE) IMPLANT
GUIDEWIRE STR DUAL SENSOR (WIRE) ×3 IMPLANT
IV NS 1000ML (IV SOLUTION) ×3
IV NS 1000ML BAXH (IV SOLUTION) IMPLANT
IV NS IRRIG 3000ML ARTHROMATIC (IV SOLUTION) ×3 IMPLANT
KIT BALLIN UROMAX 15FX10 (LABEL) IMPLANT
KIT BALLN UROMAX 15FX4 (MISCELLANEOUS) IMPLANT
KIT BALLN UROMAX 26 75X4 (MISCELLANEOUS)
PACK CYSTO (CUSTOM PROCEDURE TRAY) ×3 IMPLANT
SET HIGH PRES BAL DIL (LABEL)
SHEATH ACCESS URETERAL 38CM (SHEATH) IMPLANT

## 2014-07-19 NOTE — Anesthesia Preprocedure Evaluation (Addendum)
Anesthesia Evaluation  Patient identified by MRN, date of birth, ID band Patient awake    Reviewed: Allergy & Precautions, NPO status , Patient's Chart, lab work & pertinent test results  Airway Mallampati: II  TM Distance: >3 FB Neck ROM: Full    Dental no notable dental hx. (+) Caps, Dental Advisory Given,    Pulmonary Current Smoker,  breath sounds clear to auscultation  Pulmonary exam normal       Cardiovascular Exercise Tolerance: Poor hypertension, Pt. on medications and Pt. on home beta blockers Rhythm:Regular Rate:Normal  Coronary artery disease CARDIOLOGIST-- DR Rayann Heman NON-OBSTRUCTIVE CAD--- 2005 Cath with 30% LAD, calcification/plaque of LAD, EF 60%     Neuro/Psych negative neurological ROS  negative psych ROS   GI/Hepatic Neg liver ROS, GERD-  Medicated,  Endo/Other  negative endocrine ROS  Renal/GU negative Renal ROS  negative genitourinary   Musculoskeletal negative musculoskeletal ROS (+)   Abdominal   Peds negative pediatric ROS (+)  Hematology negative hematology ROS (+)   Anesthesia Other Findings   Reproductive/Obstetrics negative OB ROS                           Anesthesia Physical Anesthesia Plan  ASA: III  Anesthesia Plan: General   Post-op Pain Management:    Induction: Intravenous  Airway Management Planned: LMA  Additional Equipment:   Intra-op Plan:   Post-operative Plan: Extubation in OR  Informed Consent: I have reviewed the patients History and Physical, chart, labs and discussed the procedure including the risks, benefits and alternatives for the proposed anesthesia with the patient or authorized representative who has indicated his/her understanding and acceptance.   Dental advisory given  Plan Discussed with: CRNA and Surgeon  Anesthesia Plan Comments:         Anesthesia Quick Evaluation

## 2014-07-19 NOTE — Anesthesia Procedure Notes (Signed)
Procedure Name: LMA Insertion Date/Time: 07/19/2014 11:55 AM Performed by: Wanita Chamberlain Pre-anesthesia Checklist: Patient identified, Emergency Drugs available, Suction available, Patient being monitored and Timeout performed Patient Re-evaluated:Patient Re-evaluated prior to inductionOxygen Delivery Method: Circle system utilized Preoxygenation: Pre-oxygenation with 100% oxygen Intubation Type: IV induction Ventilation: Mask ventilation without difficulty LMA: LMA inserted LMA Size: 4.0 Number of attempts: 1 Airway Equipment and Method: Bite block Placement Confirmation: positive ETCO2 Tube secured with: Tape Dental Injury: Teeth and Oropharynx as per pre-operative assessment

## 2014-07-19 NOTE — Transfer of Care (Signed)
Immediate Anesthesia Transfer of Care Note  Patient: [name]  Procedure(s) Performed: Procedure(s): RIGHT URETEROSCOPY/STONE EXTRACTION WITH BASKET (Right) CYSTOSCOPY WITH RETROGRADE PYELOGRAM (Right)  Patient Location: PACU  Anesthesia Type:General  Level of Consciousness: awake, alert , oriented and patient cooperative  Airway & Oxygen Therapy: Patient Spontanous Breathing and Patient connected to nasal cannula oxygen  Post-op Assessment: Report given to RN and Post -op Vital signs reviewed and stable  Post vital signs: Reviewed and stable  Last Vitals:  Filed Vitals:   07/19/14 1103  BP: 156/62  Pulse: 67  Temp: 36.7 C  Resp: 14    Complications: No apparent anesthesia complications

## 2014-07-19 NOTE — Brief Op Note (Signed)
07/19/2014  12:21 PM  PATIENT:  [name]  74 y.o. male  PRE-OPERATIVE DIAGNOSIS:  right mid ureteral stone  POST-OPERATIVE DIAGNOSIS:  right mid ureteral stone  PROCEDURE:  Procedure(s): RIGHT URETEROSCOPY/STONE EXTRACTION WITH BASKET (Right) CYSTOSCOPY WITH RETROGRADE PYELOGRAM (Right)  SURGEON:  Surgeon(s) and Role:    * Malka So, MD - Primary  PHYSICIAN ASSISTANT:   ASSISTANTS: none   ANESTHESIA:   general  EBL:     BLOOD ADMINISTERED:none  DRAINS: none   LOCAL MEDICATIONS USED:  NONE  SPECIMEN:  Source of Specimen:  right ureteral stone  DISPOSITION OF SPECIMEN:  to family  COUNTS:  YES  TOURNIQUET:  * No tourniquets in log *  DICTATION: .Other Dictation: Dictation Number [id number]  PLAN OF CARE: Discharge to home after PACU  PATIENT DISPOSITION:  PACU - hemodynamically stable.   Delay start of Pharmacological VTE agent (>24hrs) due to surgical blood loss or risk of bleeding: not applicable

## 2014-07-19 NOTE — Discharge Instructions (Addendum)
CYSTOSCOPY HOME CARE INSTRUCTIONS  Activity: Rest for the remainder of the day.  Do not drive or operate equipment today.  You may resume normal activities in one to two days as instructed by your physician.   Meals: Drink plenty of liquids and eat light foods such as gelatin or soup this evening.  You may return to a normal meal plan tomorrow.  Return to Work: You may return to work in one to two days or as instructed by your physician.  Special Instructions / Symptoms: Call your physician if any of these symptoms occur:   -persistent or heavy bleeding  -bleeding which continues after first few urination  -large blood clots that are difficult to pass  -urine stream diminishes or stops completely  -fever equal to or higher than 101 degrees Farenheit.  -cloudy urine with a strong, foul odor  -severe pain  Females should always wipe from front to back after elimination.  You may feel some burning pain when you urinate.  This should disappear with time.  Applying moist heat to the lower abdomen or a hot tub bath may help relieve the pain. \  You may have pain that mimics a kidney stone and if that occurs it is most likely from passage of a small clot or spasm of the ureter.  It is not unusual for this to occur and should be managed with pain meds as needed.      Post Anesthesia Home Care Instructions  Activity: Get plenty of rest for the remainder of the day. A responsible adult should stay with you for 24 hours following the procedure.  For the next 24 hours, DO NOT: -Drive a car -Paediatric nurse -Drink alcoholic beverages -Take any medication unless instructed by your physician -Make any legal decisions or sign important papers.  Meals: Start with liquid foods such as gelatin or soup. Progress to regular foods as tolerated. Avoid greasy, spicy, heavy foods. If nausea and/or vomiting occur, drink only clear liquids until the nausea and/or vomiting subsides. Call your  physician if vomiting continues.  Special Instructions/Symptoms: Your throat may feel dry or sore from the anesthesia or the breathing tube placed in your throat during surgery. If this causes discomfort, gargle with warm salt water. The discomfort should disappear within 24 hours.

## 2014-07-20 ENCOUNTER — Encounter (HOSPITAL_BASED_OUTPATIENT_CLINIC_OR_DEPARTMENT_OTHER): Payer: Self-pay | Admitting: Urology

## 2014-07-20 NOTE — Op Note (Signed)
[name], [name]                  ACCOUNT NO.:  [id number]  MEDICAL RECORD NO.:  [id number]  LOCATION:                                 FACILITY:  PHYSICIAN:  Marshall Cork. Jeffie Pollock, M.D.    DATE OF BIRTH:  [birth year]  DATE OF PROCEDURE:  07/19/2014 DATE OF DISCHARGE:  07/19/2014                              OPERATIVE REPORT   PROCEDURE: 1. Cystoscopy with right retrograde pyelogram and interpretation. 2. Right ureteroscopic stone extraction.  PREOPERATIVE DIAGNOSIS:  Right mid ureteral stone.  POSTOPERATIVE DIAGNOSIS:  Right mid ureteral stone.  SURGEON:  Marshall Cork. Jeffie Pollock, M.D.  ANESTHESIA:  General.  SPECIMEN:  Stone fragments.  DRAINS:  None.  BLOOD LOSS:  None.  COMPLICATIONS:  None.  INDICATIONS:  [name] is a 74 year old white male who has a 4 mm right mid ureteral stone that has not progressed and he is to undergo ureteroscopy.  FINDINGS OF PROCEDURE:  He was taken to the operating room, where he was given Cipro.  A general anesthetic was induced.  He was placed in lithotomy position.  His perineum and genitalia were prepped with Betadine solution.  He was draped in the usual sterile fashion.  Cystoscopy was performed using the 22-French scope and a 12-degree lens. Examination revealed a normal urethra.  The external sphincter was intact.  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  The ureteral orifices were unremarkable.  The bladder wall had mild trabeculation with no mucosal lesions.  The right ureteral orifice was cannulated with 6-French open-end catheter and contrast was instilled.  This revealed a normal caliber ureter up to the filling defect in the mid ureter consistent with a stone.  There was minimal proximal dilation.  Once the retrograde was performed, the sensor guidewire was advanced to the kidney under fluoroscopic guidance.  The open-end catheter and scope were then removed and a 24 cm digital access sheath 12-French inner core was used to  dilate the distal ureter.  This dilated easily.  A 6.5-French semirigid ureteroscope was then passed alongside the wire to the level of the stone.  The stone was then engaged with a Nitinol basket.  It actually broke into 2 fragments and those fragments were each removed to the bladder without need for further treatment with laser.  Once the stone had been removed, final inspection revealed no significant ureteral injury and it was not felt a stent was indicated. The ureteroscope was removed.  The cystoscope was reinserted over the wire and the wire was removed.  The stone fragments were evacuated from the bladder and the bladder was partially drained.  The patient was taken down from lithotomy position.  His anesthetic was reversed.  He was moved to the recovery room in stable condition.  There were no complications.     Marshall Cork. Jeffie Pollock, M.D.     JJW/MEDQ  D:  07/19/2014  T:  07/20/2014  Job:  [id number]

## 2014-07-30 NOTE — Anesthesia Postprocedure Evaluation (Signed)
  Anesthesia Post-op Note  Patient: [name]  Procedure(s) Performed: Procedure(s) (LRB): RIGHT URETEROSCOPY/STONE EXTRACTION WITH BASKET (Right) CYSTOSCOPY WITH RETROGRADE PYELOGRAM (Right)  Patient Location: PACU  Anesthesia Type: General  Level of Consciousness: awake and alert   Airway and Oxygen Therapy: Patient Spontanous Breathing  Post-op Pain: mild  Post-op Assessment: Post-op Vital signs reviewed, Patient's Cardiovascular Status Stable, Respiratory Function Stable, Patent Airway and No signs of Nausea or vomiting  Last Vitals:  Filed Vitals:   07/19/14 1428  BP: 138/62  Pulse: 52  Temp: 36.7 C  Resp: 18    Post-op Vital Signs: stable   Complications: No apparent anesthesia complications

## 2014-08-16 ENCOUNTER — Other Ambulatory Visit: Payer: Self-pay | Admitting: Gastroenterology

## 2014-09-14 ENCOUNTER — Other Ambulatory Visit: Payer: Self-pay | Admitting: Cardiology

## 2014-09-17 ENCOUNTER — Other Ambulatory Visit: Payer: Self-pay | Admitting: *Deleted

## 2014-10-30 ENCOUNTER — Other Ambulatory Visit: Payer: Self-pay

## 2014-10-30 MED ORDER — METOPROLOL SUCCINATE ER 25 MG PO TB24: 25.0000 mg | ORAL_TABLET | Freq: Two times a day (BID) | ORAL | Status: DC

## 2014-10-30 MED ORDER — DILTIAZEM HCL ER BEADS 360 MG PO CP24: 360.0000 mg | ORAL_CAPSULE | Freq: Every day | ORAL | Status: DC

## 2014-11-13 ENCOUNTER — Telehealth: Payer: Self-pay | Admitting: Cardiology

## 2014-11-13 ENCOUNTER — Other Ambulatory Visit: Payer: Self-pay | Admitting: *Deleted

## 2014-11-13 MED ORDER — METOPROLOL SUCCINATE ER 25 MG PO TB24: 25.0000 mg | ORAL_TABLET | Freq: Two times a day (BID) | ORAL | Status: DC

## 2014-11-13 NOTE — Telephone Encounter (Signed)
ERROR  Sent message to Refills.

## 2014-11-14 ENCOUNTER — Telehealth: Payer: Self-pay | Admitting: Internal Medicine

## 2014-11-14 NOTE — Telephone Encounter (Signed)
Patient's doctor has left and Dr. Rennis Harding recommended you to him. Can you take him on as a new patient?

## 2014-11-15 NOTE — Telephone Encounter (Signed)
Ok with me 

## 2014-11-16 NOTE — Telephone Encounter (Signed)
Left msg on  vm

## 2014-12-03 ENCOUNTER — Ambulatory Visit: Payer: Medicare HMO | Admitting: Internal Medicine

## 2014-12-10 ENCOUNTER — Other Ambulatory Visit: Payer: Self-pay | Admitting: Cardiology

## 2014-12-13 ENCOUNTER — Telehealth: Payer: Self-pay | Admitting: Internal Medicine

## 2014-12-13 NOTE — Telephone Encounter (Signed)
New patient would like to talk to you [facility] He want Dr Ronnald Ramp to referrer him to see Dr Silvio Pate.

## 2014-12-13 NOTE — Telephone Encounter (Signed)
Pt never seen by Dr. Ronnald Ramp, must establish before referral or anything else can be done.

## 2014-12-14 ENCOUNTER — Telehealth: Payer: Self-pay | Admitting: Gastroenterology

## 2014-12-14 NOTE — Telephone Encounter (Signed)
I know that he has never been seen, I told him that Dr Ronnald Ramp would not be able to referrer him because because he has not been seen yet, but he insisted that he talk to his nurse, and I told him that you would tell him the same thing I told him, he was adimate about it that why I sent the message back.

## 2014-12-14 NOTE — Telephone Encounter (Signed)
Noted  

## 2014-12-14 NOTE — Telephone Encounter (Signed)
Pt states he has been having diarrhea since Monday and that he has had cdiff in the past. Pt states he thinks he has cdiff and wants an antibiotic called in. Discussed with pt that we would not call in med unless he was seen or gave a specimen. Pt states he has to be at work at 5:30 and cannot come to give specimen. Discussed with pt that perhaps he could try Urgent care. Pt verbalized understanding and instructed to call us back if he needed anything further.

## 2014-12-20 ENCOUNTER — Encounter: Payer: Self-pay | Admitting: Internal Medicine

## 2014-12-20 ENCOUNTER — Other Ambulatory Visit (INDEPENDENT_AMBULATORY_CARE_PROVIDER_SITE_OTHER): Payer: Medicare HMO

## 2014-12-20 ENCOUNTER — Ambulatory Visit (INDEPENDENT_AMBULATORY_CARE_PROVIDER_SITE_OTHER): Payer: Medicare HMO | Admitting: Internal Medicine

## 2014-12-20 VITALS — BP 122/68 | HR 68 | Temp 98.4°F | Resp 16 | Ht 66.0 in

## 2014-12-20 DIAGNOSIS — Z23 Encounter for immunization: Secondary | ICD-10-CM

## 2014-12-20 DIAGNOSIS — R10814 Left lower quadrant abdominal tenderness: Secondary | ICD-10-CM

## 2014-12-20 DIAGNOSIS — K5792 Diverticulitis of intestine, part unspecified, without perforation or abscess without bleeding: Secondary | ICD-10-CM | POA: Diagnosis not present

## 2014-12-20 LAB — CBC WITH DIFFERENTIAL/PLATELET
Basophils Absolute: 0 10*3/uL (ref 0.0–0.1)
Basophils Relative: 0.2 % (ref 0.0–3.0)
Eosinophils Absolute: 0.1 10*3/uL (ref 0.0–0.7)
Eosinophils Relative: 0.8 % (ref 0.0–5.0)
HEMATOCRIT: 42.9 % (ref 39.0–52.0)
Hemoglobin: 14.3 g/dL (ref 13.0–17.0)
Lymphocytes Relative: 15.7 % (ref 12.0–46.0)
Lymphs Abs: 1.8 10*3/uL (ref 0.7–4.0)
MCHC: 33.3 g/dL (ref 30.0–36.0)
MCV: 90 fl (ref 78.0–100.0)
MONO ABS: 0.5 10*3/uL (ref 0.1–1.0)
Monocytes Relative: 4.1 % (ref 3.0–12.0)
NEUTROS PCT: 79.2 % — AB (ref 43.0–77.0)
Neutro Abs: 8.9 10*3/uL — ABNORMAL HIGH (ref 1.4–7.7)
Platelets: 297 10*3/uL (ref 150.0–400.0)
RBC: 4.77 Mil/uL (ref 4.22–5.81)
RDW: 16.1 % — ABNORMAL HIGH (ref 11.5–15.5)
WBC: 11.2 10*3/uL — ABNORMAL HIGH (ref 4.0–10.5)

## 2014-12-20 LAB — BASIC METABOLIC PANEL
BUN: 21 mg/dL (ref 6–23)
CO2: 28 mEq/L (ref 19–32)
Calcium: 9.4 mg/dL (ref 8.4–10.5)
Chloride: 105 mEq/L (ref 96–112)
Creatinine, Ser: 0.78 mg/dL (ref 0.40–1.50)
GFR: 103.09 mL/min (ref >60.00)
Glucose, Bld: 98 mg/dL (ref 70–99)
POTASSIUM: 3.9 meq/L (ref 3.5–5.1)
Sodium: 139 mEq/L (ref 135–145)

## 2014-12-20 LAB — LIPASE: Lipase: 7 U/L — ABNORMAL LOW (ref 11.0–59.0)

## 2014-12-20 LAB — SEDIMENTATION RATE: SED RATE: 16 mm/h (ref 0–22)

## 2014-12-20 LAB — C-REACTIVE PROTEIN: CRP: 0.2 mg/dL — AB (ref 0.5–20.0)

## 2014-12-20 MED ORDER — CIPROFLOXACIN HCL 500 MG PO TABS: 500.0000 mg | ORAL_TABLET | Freq: Two times a day (BID) | ORAL | Status: AC

## 2014-12-20 MED ORDER — METRONIDAZOLE 500 MG PO TABS: 500.0000 mg | ORAL_TABLET | Freq: Three times a day (TID) | ORAL | Status: DC

## 2014-12-20 MED ORDER — CIPROFLOXACIN HCL 500 MG PO TABS: 500.0000 mg | ORAL_TABLET | Freq: Two times a day (BID) | ORAL | Status: DC

## 2014-12-20 NOTE — Progress Notes (Signed)
Pre visit review using our clinic review tool, if applicable. No additional management support is needed unless otherwise documented below in the visit note. 

## 2014-12-20 NOTE — Progress Notes (Signed)
Subjective:  Patient ID: [name], male    DOB: 1941/03/12  Age: 74 y.o. MRN: [id number]  CC: Abdominal Pain   HPI [name] presents for left lower quadrant abdominal pain for about 1-2 weeks. He describes an intermittent achy sensation with constipation.  History [name] has a past medical history of Hyperlipidemia; Hypertension; GERD (gastroesophageal reflux disease); ED (erectile dysfunction); Peyronie's disease; Depression; Hemorrhoids (2002); History of kidney stones (2014); IBS (irritable bowel syndrome); Ischemic colitis; History of esophageal dilatation; History of peptic ulcer; History of duodenal ulcer; Diverticulosis, sigmoid; Coronary artery disease (CARDIOLOGIST-- DR Rayann Heman); History of prostate cancer; Right ureteral stone; SUI (stress urinary incontinence), male; PAC (premature atrial contraction); Adenoma of left adrenal gland; History of gallstones; Frequency of urination; Nocturia; OA (osteoarthritis); DDD (degenerative disc disease), lumbosacral; Rotator cuff tear arthropathy of both shoulders; Wears glasses; and Eczema.   He has past surgical history that includes Nasal septum surgery (YRS AGO); Flexible sigmoidoscopy (N/A, 03/19/2014); RADICAL PROSTATECTOMY/ BILATERAL RETROPERITONEAL LYMPH NODE DISSECTION (12-29-2007); Tonsillectomy and adenoidectomy (as child); Lumbar fusion (03-26-2005); REMOVAL BENIGN RIGHT EYELID LESION (12-19-2007); Cardiovascular stress test (12-23-2012  DR ALLRED); transthoracic echocardiogram (07-13-2014); Cardiac catheterization (07-23-2003); Inguinal hernia repair (Bilateral, 03-17-2010); SLING CYSTOURETHROPEXY (AdVANCE) (10-12-2009); Lumbar laminectomy (1999); Extracorporeal shock wave lithotripsy (Right, 01-30-2013); Esophagogastroduodenoscopy (egd) with esophageal dilation (02-04-2001); Colonoscopy (last one 06-19-2011); Cystoscopy/retrograde/ureteroscopy/stone extraction with basket (Right, 07/19/2014); and Cystoscopy w/ retrogrades (Right,  07/19/2014).   His family history includes Aneurysm in his father; Bone cancer in his maternal grandfather; Colon cancer (age of onset: 48) in his maternal aunt; Heart attack (age of onset: 80) in his father; Lymphoma in his maternal aunt.He reports that he has been smoking Cigarettes.  He has a 53 pack-year smoking history. He has never used smokeless tobacco. He reports that he does not drink alcohol or use illicit drugs.  Outpatient Prescriptions Prior to Visit  Medication Sig Dispense Refill  . dicyclomine (BENTYL) 10 MG capsule Take one capsule by mouth before meals three times a day 270 capsule 1  . diltiazem (TIAZAC) 360 MG 24 hr capsule Take 1 capsule (360 mg total) by mouth daily. 90 capsule 0  . HYDROcodone-acetaminophen (NORCO/VICODIN) 5-325 MG per tablet Take 1 tablet by mouth every 6 (six) hours as needed for moderate pain. 30 tablet 0  . metoprolol succinate (TOPROL-XL) 25 MG 24 hr tablet Take 1 tablet (25 mg total) by mouth 2 (two) times daily. 180 tablet 0  . Multiple Vitamin (MULTIVITAMIN WITH MINERALS) TABS tablet Take 1 tablet by mouth daily.    Marland Kitchen omeprazole (PRILOSEC) 20 MG capsule Take 20 mg by mouth 2 (two) times daily.    Marland Kitchen oxymetazoline (AFRIN) 0.05 % nasal spray Place 2 sprays into the nose at bedtime as needed for congestion.    . pravastatin (PRAVACHOL) 40 MG tablet TAKE 1 TABLET (40 MG TOTAL) EVERY EVENING. 90 tablet 0  . Probiotic Product (PROBIOTIC DAILY PO) Take 1 tablet by mouth daily.    Marland Kitchen topiramate (TOPAMAX) 25 MG tablet Take 25 mg by mouth daily as needed (for headaches).     . traZODone (DESYREL) 50 MG tablet TAKE 1 TABLET BY MOUTH EVERY NIGHT AT BEDTIME 90 tablet 2  . traZODone (DESYREL) 50 MG tablet Take 50 mg by mouth at bedtime as needed for sleep.     No facility-administered medications prior to visit.    ROS Review of Systems  Constitutional: Negative.  Negative for fever, chills, diaphoresis, appetite change and fatigue.  HENT: Negative.  Eyes:  Negative.   Respiratory: Negative.   Cardiovascular: Negative.  Negative for chest pain, palpitations and leg swelling.  Gastrointestinal: Positive for abdominal pain and constipation. Negative for nausea, vomiting, diarrhea, blood in stool, abdominal distention, anal bleeding and rectal pain.  Endocrine: Negative.   Genitourinary: Negative.  Negative for dysuria, urgency, hematuria, flank pain, decreased urine volume, discharge, difficulty urinating and testicular pain.  Musculoskeletal: Negative.   Skin: Negative.  Negative for rash.  Allergic/Immunologic: Negative.   Neurological: Negative.  Negative for dizziness and weakness.  Hematological: Negative.   Psychiatric/Behavioral: Negative.     Objective:  BP 122/68 mmHg  Pulse 68  Temp(Src) 98.4 F (36.9 C) (Oral)  Resp 16  Ht 5\' 6"  (1.676 m)  SpO2 95%  Physical Exam  Constitutional: He is oriented to person, place, and time.  Non-toxic appearance. He does not have a sickly appearance. He does not appear ill. No distress.  HENT:  Mouth/Throat: Oropharynx is clear and moist. No oropharyngeal exudate.  Eyes: Conjunctivae are normal. Right eye exhibits no discharge. Left eye exhibits no discharge. No scleral icterus.  Neck: Normal range of motion. Neck supple. No JVD present. No tracheal deviation present. No thyromegaly present.  Cardiovascular: Normal rate, regular rhythm, normal heart sounds and intact distal pulses.  Exam reveals no gallop and no friction rub.   No murmur heard. Pulmonary/Chest: Effort normal and breath sounds normal. No stridor. No respiratory distress. He has no wheezes. He has no rales. He exhibits no tenderness.  Abdominal: Soft. Normal appearance. He exhibits no shifting dullness, no distension, no pulsatile liver, no fluid wave, no abdominal bruit, no ascites, no pulsatile midline mass and no mass. Bowel sounds are increased. There is no splenomegaly or hepatomegaly. There is tenderness in the left lower  quadrant. There is no rigidity, no rebound, no guarding, no CVA tenderness, no tenderness at McBurney's point and negative Murphy's sign. No hernia. Hernia confirmed negative in the ventral area, confirmed negative in the right inguinal area and confirmed negative in the left inguinal area.  Musculoskeletal: Normal range of motion. He exhibits no edema or tenderness.  Lymphadenopathy:    He has no cervical adenopathy.  Neurological: He is oriented to person, place, and time.  Skin: Skin is warm and dry. No rash noted. He is not diaphoretic. No erythema. No pallor.    Lab Results  Component Value Date   WBC 11.2* 12/20/2014   HGB 14.3 12/20/2014   HCT 42.9 12/20/2014   PLT 297.0 12/20/2014   GLUCOSE 98 12/20/2014   CHOL 127 11/21/2013   TRIG 94.0 11/21/2013   HDL 40.70 11/21/2013   LDLDIRECT 134.5 08/22/2007   LDLCALC 68 11/21/2013   ALT 21 06/16/2014   AST 23 06/16/2014   NA 139 12/20/2014   K 3.9 12/20/2014   CL 105 12/20/2014   CREATININE 0.78 12/20/2014   BUN 21 12/20/2014   CO2 28 12/20/2014   TSH 0.65 12/09/2011   PSA 6.69* 08/22/2007   INR 0.97 01/16/2014    Assessment & Plan:   [name] was seen today for abdominal pain.  Diagnoses and all orders for this visit:  Abdominal tenderness, left lower quadrant- his exam is benign. His labs do not show any suspicion for ischemic colitis. Plain film of the abdomen is pending. His white cell count is slightly elevated but his sedimentation rate and CRP are normal. I am concerned he has acute diverticulitis and will treat with broad-spectrum antibiotics with Cipro and Flagyl. Orders: -  Lipase; Future -     Basic metabolic panel; Future -     CBC with Differential/Platelet; Future -     Sedimentation rate; Future -     C-reactive protein; Future -     DG Abd Acute W/Chest; Future  Need for prophylactic vaccination with combined diphtheria-tetanus-pertussis (DTP) vaccine Orders: -     Tdap vaccine greater than or equal  to 7yo IM  Diverticulitis of intestine without perforation or abscess without bleeding Orders: -     Discontinue: ciprofloxacin (CIPRO) 500 MG tablet; Take 1 tablet (500 mg total) by mouth 2 (two) times daily. -     metroNIDAZOLE (FLAGYL) 500 MG tablet; Take 1 tablet (500 mg total) by mouth 3 (three) times daily. -     ciprofloxacin (CIPRO) 500 MG tablet; Take 1 tablet (500 mg total) by mouth 2 (two) times daily.  I am having Mr. Herbold start on metroNIDAZOLE. I am also having him maintain his omeprazole, multivitamin with minerals, oxymetazoline, topiramate, Probiotic Product (PROBIOTIC DAILY PO), HYDROcodone-acetaminophen, dicyclomine, pravastatin, diltiazem, metoprolol succinate, traZODone, and ciprofloxacin.  Meds ordered this encounter  Medications  . DISCONTD: ciprofloxacin (CIPRO) 500 MG tablet    Sig: Take 1 tablet (500 mg total) by mouth 2 (two) times daily.    Dispense:  14 tablet    Refill:  1  . metroNIDAZOLE (FLAGYL) 500 MG tablet    Sig: Take 1 tablet (500 mg total) by mouth 3 (three) times daily.    Dispense:  21 tablet    Refill:  1  . ciprofloxacin (CIPRO) 500 MG tablet    Sig: Take 1 tablet (500 mg total) by mouth 2 (two) times daily.    Dispense:  14 tablet    Refill:  1     Follow-up: Return in about 1 week (around 12/27/2014).  Scarlette Calico, MD

## 2014-12-20 NOTE — Patient Instructions (Signed)

## 2014-12-25 ENCOUNTER — Telehealth: Payer: Self-pay | Admitting: Gastroenterology

## 2014-12-25 NOTE — Telephone Encounter (Signed)
Patient with soft stools.  He is currently being treated with cipro and flagyl for diverticulitis prescribed by his primary care last week.  He is having loose urgent stools that "come with no warning".  His abdominal tenderness from last week is only slightly improved.  He is scheduled to see Arta Bruce, PA tomorrow at 2:15.  He has Salli Quarry and is advised he will need to contact his primary care for a referral.  He will call me back if he is not able to obtain the referral or Dr. Ronnald Ramp is going to see him again in the office.

## 2014-12-26 ENCOUNTER — Ambulatory Visit (INDEPENDENT_AMBULATORY_CARE_PROVIDER_SITE_OTHER): Payer: Commercial Managed Care - HMO | Admitting: Physician Assistant

## 2014-12-26 ENCOUNTER — Other Ambulatory Visit (INDEPENDENT_AMBULATORY_CARE_PROVIDER_SITE_OTHER): Payer: Commercial Managed Care - HMO

## 2014-12-26 ENCOUNTER — Encounter: Payer: Self-pay | Admitting: Physician Assistant

## 2014-12-26 VITALS — BP 140/60 | HR 72 | Ht 65.75 in | Wt 130.2 lb

## 2014-12-26 DIAGNOSIS — K5732 Diverticulitis of large intestine without perforation or abscess without bleeding: Secondary | ICD-10-CM | POA: Diagnosis not present

## 2014-12-26 DIAGNOSIS — R1032 Left lower quadrant pain: Secondary | ICD-10-CM

## 2014-12-26 LAB — CBC WITH DIFFERENTIAL/PLATELET
BASOS ABS: 0 10*3/uL (ref 0.0–0.1)
Basophils Relative: 0.4 % (ref 0.0–3.0)
Eosinophils Absolute: 0.2 10*3/uL (ref 0.0–0.7)
Eosinophils Relative: 2.2 % (ref 0.0–5.0)
HCT: 45.3 % (ref 39.0–52.0)
HEMOGLOBIN: 15.1 g/dL (ref 13.0–17.0)
LYMPHS PCT: 21.5 % (ref 12.0–46.0)
Lymphs Abs: 2 10*3/uL (ref 0.7–4.0)
MCHC: 33.3 g/dL (ref 30.0–36.0)
MCV: 90.6 fl (ref 78.0–100.0)
MONO ABS: 0.5 10*3/uL (ref 0.1–1.0)
MONOS PCT: 5.6 % (ref 3.0–12.0)
Neutro Abs: 6.6 10*3/uL (ref 1.4–7.7)
Neutrophils Relative %: 70.3 % (ref 43.0–77.0)
Platelets: 295 10*3/uL (ref 150.0–400.0)
RBC: 5 Mil/uL (ref 4.22–5.81)
RDW: 16.2 % — AB (ref 11.5–15.5)
WBC: 9.3 10*3/uL (ref 4.0–10.5)

## 2014-12-26 LAB — BASIC METABOLIC PANEL
BUN: 19 mg/dL (ref 6–23)
CHLORIDE: 104 meq/L (ref 96–112)
CO2: 30 meq/L (ref 19–32)
Calcium: 9.4 mg/dL (ref 8.4–10.5)
Creatinine, Ser: 0.74 mg/dL (ref 0.40–1.50)
GFR: 109.54 mL/min (ref >60.00)
GLUCOSE: 93 mg/dL (ref 70–99)
Potassium: 4.2 mEq/L (ref 3.5–5.1)
SODIUM: 138 meq/L (ref 135–145)

## 2014-12-26 LAB — SEDIMENTATION RATE: Sed Rate: 9 mm/hr (ref 0–22)

## 2014-12-26 NOTE — Progress Notes (Signed)
Patient ID: [name], male   DOB: 1941/04/10, 74 y.o.   MRN: [id number]     History of Present Illness: [name] is a pleasant 74 year old male known to Dr. Fuller Plan with a history of diverticulosis, irritable bowel syndrome, and ischemic colitis. Colonoscopy in January 2013 showed moderate diverticulosis in the sigmoid to transverse colon. Sigmoidoscopy in October 2015 again showed moderate diverticulosis in the sigmoid and descending colon. The mucosa looked basically normal with perhaps questionable faint erythema. I abscesses however reread as showing ischemic colitis. His past medical history includes hyperlipidemia, hypertension, erectile dysfunction, GERD, depression, kidney stones, IBS, esophageal strictures requiring dilation, ischemic colitis, peptic ulcer, duodenal ulcer, coronary artery disease, prostate cancer, status post radical prostatectomy in 2009, urinary incontinence, degenerative disc disease, incontinence.  He was evaluated by his primary care provider on July 14 after having experienced left lower quadrant pain for 1 or 2 weeks duration. He describes the pain as intermittent and achy in sensation but it has become more pronounced over the past several days. His left lower quadrant pain radiates into the suprapubic area and radiates into the left low back. White blood cell count showed an elevated white count but his sedimentation rate and CRP are normal. He was empirically treated with Cipro and Flagyl. He states that he felt very dizzy with the Flagyl so after 1 day he discontinued the Flagyl but he has been taking the Cipro. He has not noticed any improvement of his left lower quadrant pain. He reports that his stools have been somewhat pasty and he has been experiencing some anal seepage. He complains of excess gas and flatulence. He has had no fever or night sweats but had some chills last night. He denies rectal pain itching or burning and denies a sensation of incomplete bleed  evacuation. He has had no pneumaturia.   Past Medical History  Diagnosis Date  . Hyperlipidemia   . Hypertension   . GERD (gastroesophageal reflux disease)   . ED (erectile dysfunction)   . Peyronie's disease   . Depression   . Hemorrhoids 2002  . History of kidney stones 2014    right ureteral lithotripsy 01/2013  . IBS (irritable bowel syndrome)   . Ischemic colitis   . History of esophageal dilatation     FOR STRICTURE 2008  . History of peptic ulcer   . History of duodenal ulcer   . Diverticulosis, sigmoid   . Coronary artery disease CARDIOLOGIST-- DR Rayann Heman    NON-OBSTRUCTIVE CAD--- 2005 Cath with 30% LAD, calcification/plaque of LAD, EF 60%  . History of prostate cancer     S/P RADICAL PROSTATECTOMY 2009  . Right ureteral stone   . SUI (stress urinary incontinence), male   . PAC (premature atrial contraction)   . Adenoma of left adrenal gland     stable per ct  . History of gallstones   . Frequency of urination   . Nocturia   . OA (osteoarthritis)     RIGHT SHOULDER/ HIPS  . DDD (degenerative disc disease), lumbosacral   . 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 Wears glasses   . Eczema   . Encopresis     Past Surgical History  Procedure Laterality Date  . Nasal septum surgery  YRS AGO  . Flexible sigmoidoscopy N/A 03/19/2014    Procedure: FLEXIBLE SIGMOIDOSCOPY;  Surgeon: Inda Castle, MD;  Location: [place];  Service: Endoscopy;  Laterality: N/A;  . Radical prostatectomy/ bilateral retroperitoneal lymph  node dissection  12-29-2007  . Tonsillectomy and adenoidectomy  as child  . Lumbar fusion  03-26-2005    Revision laminectomy L5 - S1,  Laminectomy and Decompression L4-L5, fusion L4 -- S1  . Removal benign right eyelid lesion  12-19-2007  . Cardiovascular stress test  12-23-2012  DR ALLRED    Low risk lexiscan study with small mild intensity, fixed inferior defect consistent with thinning, no ischemia/  normal LVF and wall motion, ef 66%   . Transthoracic echocardiogram  07-13-2014    mild LVH/  ef 60-655,  grade I diastolic dysfunction/  trivial AR, MR and TR/  mild LAE  . Cardiac catheterization  07-23-2003    Non-obstructive CAD/  30% narrowing ostium LM,  30-40% eccentric calcified plaque LAD, 20% narrowing RCA/  preserved LVF  . Inguinal hernia repair Bilateral 03-17-2010  . Sling cystourethropexy (advance)  10-12-2009  . Lumbar laminectomy  1999    L5  -- S1  . Extracorporeal shock wave lithotripsy Right 01-30-2013  . Esophagogastroduodenoscopy (egd) with esophageal dilation  02-04-2001  . Colonoscopy  last one 06-19-2011  . Cystoscopy/retrograde/ureteroscopy/stone extraction with basket Right 07/19/2014    Procedure: RIGHT URETEROSCOPY/STONE EXTRACTION WITH BASKET;  Surgeon: Malka So, MD;  Location: [facility];  Service: Urology;  Laterality: Right;  . Cystoscopy w/ retrogrades Right 07/19/2014    Procedure: CYSTOSCOPY WITH RETROGRADE PYELOGRAM;  Surgeon: Malka So, MD;  Location: [facility];  Service: Urology;  Laterality: Right;   Family History  Problem Relation Age of Onset  . Colon cancer Maternal Aunt 27  . Heart attack Father 66  . Aneurysm Father   . Lymphoma Maternal Aunt   . Bone cancer Maternal Grandfather    History  Substance Use Topics  . Smoking status: Current Every Day Smoker -- 1.00 packs/day for 53 years    Types: Cigarettes  . Smokeless tobacco: Never Used  . Alcohol Use: No   Current Outpatient Prescriptions  Medication Sig Dispense Refill  . ciprofloxacin (CIPRO) 500 MG tablet Take 1 tablet (500 mg total) by mouth 2 (two) times daily. 14 tablet 1  . dicyclomine (BENTYL) 10 MG capsule Take one capsule by mouth before meals three times a day 270 capsule 1  . diltiazem (TIAZAC) 360 MG 24 hr capsule Take 1 capsule (360 mg total) by mouth daily. 90 capsule 0  . HYDROcodone-acetaminophen (NORCO/VICODIN) 5-325 MG per tablet Take 1 tablet by mouth every 6  (six) hours as needed for moderate pain. 30 tablet 0  . metoprolol succinate (TOPROL-XL) 25 MG 24 hr tablet Take 1 tablet (25 mg total) by mouth 2 (two) times daily. 180 tablet 0  . Multiple Vitamin (MULTIVITAMIN WITH MINERALS) TABS tablet Take 1 tablet by mouth daily.    Marland Kitchen omeprazole (PRILOSEC) 20 MG capsule Take 20 mg by mouth 2 (two) times daily.    Marland Kitchen oxymetazoline (AFRIN) 0.05 % nasal spray Place 2 sprays into the nose at bedtime as needed for congestion.    . pravastatin (PRAVACHOL) 40 MG tablet TAKE 1 TABLET (40 MG TOTAL) EVERY EVENING. 90 tablet 0  . Probiotic Product (PROBIOTIC DAILY PO) Take 1 tablet by mouth daily.    . psyllium (REGULOID) 0.52 G capsule Take 0.52 g by mouth 2 (two) times daily.    Marland Kitchen topiramate (TOPAMAX) 25 MG tablet Take 25 mg by mouth daily as needed (for headaches).     . traZODone (DESYREL) 50 MG tablet TAKE 1 TABLET BY MOUTH  EVERY NIGHT AT BEDTIME 90 tablet 2  . metroNIDAZOLE (FLAGYL) 500 MG tablet Take 1 tablet (500 mg total) by mouth 3 (three) times daily. (Patient not taking: Reported on 12/26/2014) 21 tablet 1   No current facility-administered medications for this visit.   Allergies  Allergen Reactions  . Pseudoephedrine Palpitations    "Heart races"  . Tape Other (See Comments)    tears skin, can use paper tape  . Sulfa Antibiotics Itching  . Oxycodone Other (See Comments)    "over sedates"  . Lodine [Etodolac] Diarrhea      Review of Systems: Gen: Denies any fever,  sweats, anorexia, fatigue, weakness, malaise, weight loss, and sleep disorder CV: Denies chest pain, angina, palpitations, syncope, orthopnea, PND, peripheral edema, and claudication. Resp: Denies dyspnea at rest, dyspnea with exercise, cough, sputum, wheezing, coughing up blood, and pleurisy. GI: Denies vomiting blood, jaundice, and fecal incontinence.   Denies dysphagia or odynophagia. Has left lower quadrant abdominal pain and pasty stools. GU : Denies urinary burning, blood in  urine, urinary frequency, urinary hesitancy, nocturnal urination, and urinary incontinence. MS: Denies joint pain, limitation of movement, and swelling, stiffness, low back pain, extremity pain. Denies muscle weakness, cramps, atrophy.  Derm: Denies rash, itching, dry skin, hives, moles, warts, or unhealing ulcers.  Psych: Denies depression, anxiety, memory loss, suicidal ideation, hallucinations, paranoia, and confusion. Heme: Denies bruising, bleeding, and enlarged lymph nodes. Neuro:  Denies any headaches,  Paresthesia. Felt dizzy on Flagyl. Endo:  Denies any problems with DM, thyroid, adrenal  LAB RESULTS: CBC 12/20/2014 white count 11.2, hemoglobin 14.3, hematocrit 42.9, platelets 297,000. Sedimentation rate 16. CRP 0.2.  Procedures: Flex sig 03/19/14 ENDOSCOPIC IMPRESSION: 1. Moderate diverticulosis was noted in the sigmoid colon and descending colon 2. Mucosa looked basically normal. There was perhaps questionable faint erythema. No gross abnormalities were seen. Biopsies were taken RECOMMENDATIONS: 1. Await biopsy results 2. Continue current meds  Colonoscopy 06/19/2011 showed moderate diverticulosis in the sigmoid to transverse colon with internal and external hemorrhoids.  Physical Exam: General: Pleasant, well developed male in no acute distress Head: Normocephalic and atraumatic Eyes:  sclerae anicteric, conjunctiva pink  Ears: Normal auditory acuity Lungs: Clear throughout to auscultation Heart: Regular rate and rhythm Abdomen: Soft, non distended, mild to moderate tenderness left lower quadrant and suprapubic area with guarding but no rebound No masses, no hepatomegaly. Normal bowel sounds Rectal: Deferred per patient Musculoskeletal: Symmetrical with no gross deformities  Extremities: No edema  Neurological: Alert oriented x 4, grossly nonfocal Psychological:  Alert and cooperative. Normal mood and affect  Assessment and Recommendations: 74 year old male with a  known history of diverticular disease and ischemic colitis presenting with a three-week history of left lower quadrant pain that has been becoming more pronounced. Patient has tried Cipro and Flagyl but has not noted relief of his discomfort and has had difficulty tolerating the Flagyl. Will schedule patient for CT of the abdomen and pelvis to evaluate for diverticulitis, possible abscess, or recurrent ischemic colitis. If patient is noted to have diverticulitis, we will consider a course of Augmentin along with florastor as to help prevent recurrent C. difficile. In the meantime, patient has been instructed to adhere to a low residue, low-fat diet. A repeat CBC, ESR, and basic metabolic panel will be obtained as well. Further recommendations will be made pending the findings of the above.        Mariadelosang Wynns, Vita Barley PA-C 12/26/2014,

## 2014-12-26 NOTE — Patient Instructions (Signed)
Your physician has requested that you go to the basement for lab work before leaving today.  Start a low fat, low residue diet.   You have been scheduled for a CT scan of the abdomen and pelvis at [facility] ([address]---this is in the same building as Press [facility]).   You are scheduled on 12/27/2014 at 1:00pm. You should arrive 15 minutes prior to your appointment time for registration. Please follow the written instructions below on the day of your exam:  WARNING: IF YOU ARE ALLERGIC TO IODINE/X-RAY DYE, PLEASE NOTIFY RADIOLOGY IMMEDIATELY AT [phone]! YOU WILL BE GIVEN A 13 HOUR PREMEDICATION PREP.  1) Do not eat or drink anything after 9:00am (4 hours prior to your test) 2) You have been given 2 bottles of oral contrast to drink. The solution may taste               better if refrigerated, but do NOT add ice or any other liquid to this solution. Shake             well before drinking.    Drink 1 bottle of contrast @ 11:00am (2 hours prior to your exam)  Drink 1 bottle of contrast @ 12:00pm (1 hour prior to your exam)  You may take any medications as prescribed with a small amount of water except for the following: Metformin, Glucophage, Glucovance, Avandamet, Riomet, Fortamet, Actoplus Met, Janumet, Glumetza or Metaglip. The above medications must be held the day of the exam AND 48 hours after the exam.  The purpose of you drinking the oral contrast is to aid in the visualization of your intestinal tract. The contrast solution may cause some diarrhea. Before your exam is started, you will be given a small amount of fluid to drink. Depending on your individual set of symptoms, you may also receive an intravenous injection of x-ray contrast/dye. Plan on being at [facility] for 30 minutes or long, depending on the type of exam you are having performed.  This test typically takes 30-45 minutes to complete.  If you have any questions regarding your exam or if  you need to reschedule, you may call the CT department at [phone] between the hours of 8:00 am and 5:00 pm, Monday-Friday.  ________________________________________________________________________

## 2014-12-27 ENCOUNTER — Ambulatory Visit (INDEPENDENT_AMBULATORY_CARE_PROVIDER_SITE_OTHER)
Admission: RE | Admit: 2014-12-27 | Discharge: 2014-12-27 | Disposition: A | Discharge status: Home / Self Care | Payer: Commercial Managed Care - HMO | Source: Ambulatory Visit | Attending: Physician Assistant | Admitting: Physician Assistant

## 2014-12-27 DIAGNOSIS — K5732 Diverticulitis of large intestine without perforation or abscess without bleeding: Secondary | ICD-10-CM | POA: Diagnosis not present

## 2014-12-27 DIAGNOSIS — R1032 Left lower quadrant pain: Secondary | ICD-10-CM

## 2014-12-27 DIAGNOSIS — K5730 Diverticulosis of large intestine without perforation or abscess without bleeding: Secondary | ICD-10-CM | POA: Diagnosis not present

## 2014-12-27 DIAGNOSIS — N200 Calculus of kidney: Secondary | ICD-10-CM | POA: Diagnosis not present

## 2014-12-27 MED ORDER — IOHEXOL 300 MG/ML  SOLN
100.0000 mL | Freq: Once | INTRAMUSCULAR | Status: AC | PRN
  Administered 2014-12-27: 100 mL via INTRAVENOUS

## 2014-12-27 NOTE — Progress Notes (Signed)
Reviewed and agree with management plan.  Sreekar Broyhill T. Latoria Dry, MD FACG 

## 2015-01-01 ENCOUNTER — Telehealth: Payer: Self-pay | Admitting: Physician Assistant

## 2015-01-01 ENCOUNTER — Other Ambulatory Visit: Payer: Self-pay

## 2015-01-01 DIAGNOSIS — R197 Diarrhea, unspecified: Secondary | ICD-10-CM

## 2015-01-01 NOTE — Telephone Encounter (Signed)
Patient notified. Order in [facility]

## 2015-01-01 NOTE — Telephone Encounter (Signed)
Hunter Rubin,  Do you want to do any stool studies? Specifically C-diff by PCR?

## 2015-01-01 NOTE — Telephone Encounter (Signed)
c diff

## 2015-01-03 ENCOUNTER — Other Ambulatory Visit: Payer: Commercial Managed Care - HMO

## 2015-01-03 DIAGNOSIS — R197 Diarrhea, unspecified: Secondary | ICD-10-CM | POA: Diagnosis not present

## 2015-01-05 LAB — CLOSTRIDIUM DIFFICILE BY PCR: CDIFFPCR: POSITIVE — AB

## 2015-01-07 MED ORDER — VANCOMYCIN HCL 125 MG PO CAPS: 125.0000 mg | ORAL_CAPSULE | Freq: Four times a day (QID) | ORAL | Status: DC

## 2015-01-07 MED ORDER — SACCHAROMYCES BOULARDII 250 MG PO CAPS: 500.0000 mg | ORAL_CAPSULE | Freq: Two times a day (BID) | ORAL | Status: DC

## 2015-01-11 ENCOUNTER — Telehealth: Payer: Self-pay | Admitting: Physician Assistant

## 2015-01-11 NOTE — Telephone Encounter (Signed)
Patient is taking his antibiotics as ordered. This AM, he had 2 loose black stools. He has not taken any Pepto bismol or iron tablets. He is not having any pain. He does report continued anal leakage. Please, advise.

## 2015-01-11 NOTE — Telephone Encounter (Signed)
Has c diff. Stools can look dark. Can check stool for occult blood, but would wait a few days unless he starts having a lot of black stools.

## 2015-01-11 NOTE — Telephone Encounter (Signed)
Patient given recommendations. 

## 2015-01-17 ENCOUNTER — Telehealth: Payer: Self-pay | Admitting: Physician Assistant

## 2015-01-17 MED ORDER — VANCOMYCIN 50 MG/ML ORAL SOLUTION: 250.0000 mg | Freq: Four times a day (QID) | ORAL | Status: DC

## 2015-01-17 NOTE — Telephone Encounter (Signed)
Spoke with patient who took 1/2 his course of Vancomycin and threw the other 1/2 away by mistake.  Was concerned about replacing this because of the cost.  Told him I would send in the Vancomycin liquid which was significantly cheaper. Patient acknowledged and understood.

## 2015-01-18 ENCOUNTER — Other Ambulatory Visit: Payer: Self-pay

## 2015-01-18 ENCOUNTER — Telehealth: Payer: Self-pay | Admitting: Physician Assistant

## 2015-01-18 MED ORDER — VANCOMYCIN 50 MG/ML ORAL SOLUTION: 250.0000 mg | Freq: Four times a day (QID) | ORAL | Status: DC

## 2015-01-21 ENCOUNTER — Telehealth: Payer: Self-pay

## 2015-01-21 NOTE — Telephone Encounter (Signed)
See 01/21/15 phone note

## 2015-01-21 NOTE — Telephone Encounter (Signed)
Note converted per Dr. Fuller Plan.

## 2015-01-21 NOTE — Telephone Encounter (Signed)
-----   Message from Ladene Artist, MD sent at 01/19/2015  3:45 PM EDT ----- Please convert Dr Ulyses Amor to a permanent note for the chart. Thx   ----- Message -----    From: Carol Ada, MD    Sent: 01/19/2015  10:30 AM      To: Ladene Artist, MD, Vita Barley Hvozdovic, PA-C  I called in his Vancomycin 125 mg QID x 5 days to [facility] as he was told he was going to get the liquid form.  He missed 1.5-2 days of treatment.

## 2015-01-29 ENCOUNTER — Telehealth: Payer: Self-pay | Admitting: Internal Medicine

## 2015-01-29 NOTE — Telephone Encounter (Signed)
Patient needs refill for omeprazole (PRILOSEC) 20 MG capsule [[id number] 22 day supply sent to [facility]

## 2015-01-30 ENCOUNTER — Encounter: Payer: Self-pay | Admitting: Physician Assistant

## 2015-01-30 ENCOUNTER — Ambulatory Visit (INDEPENDENT_AMBULATORY_CARE_PROVIDER_SITE_OTHER): Payer: Commercial Managed Care - HMO | Admitting: Physician Assistant

## 2015-01-30 VITALS — BP 136/64 | HR 64 | Ht 64.5 in | Wt 131.4 lb

## 2015-01-30 DIAGNOSIS — K648 Other hemorrhoids: Secondary | ICD-10-CM | POA: Diagnosis not present

## 2015-01-30 DIAGNOSIS — K5730 Diverticulosis of large intestine without perforation or abscess without bleeding: Secondary | ICD-10-CM

## 2015-01-30 DIAGNOSIS — A047 Enterocolitis due to Clostridium difficile: Secondary | ICD-10-CM

## 2015-01-30 DIAGNOSIS — A0472 Enterocolitis due to Clostridium difficile, not specified as recurrent: Secondary | ICD-10-CM

## 2015-01-30 MED ORDER — OMEPRAZOLE 20 MG PO CPDR: 20.0000 mg | DELAYED_RELEASE_CAPSULE | Freq: Two times a day (BID) | ORAL | Status: DC

## 2015-01-30 NOTE — Patient Instructions (Signed)
Finish Vancomycin.   Continue Florastor for 2-4 weeks after completion of vancomycin.   Benefiber 1 heaping tablespoon in 6oz of water before breakfast and lunch.

## 2015-01-30 NOTE — Telephone Encounter (Signed)
Called pt no answer LMOM rx has been fax to [place]...Johny Chess

## 2015-01-31 ENCOUNTER — Encounter: Payer: Self-pay | Admitting: Physician Assistant

## 2015-01-31 NOTE — Progress Notes (Signed)
Patient ID: [name], male   DOB: 1941/01/30, 74 y.o.   MRN: [id number]     History of Present Illness: [name] is a pleasant 74 year old gentleman known to Dr. Fuller Plan. He has a history of diverticulosis, IBS, and ischemic colitis. Colonoscopy in January 2013 showed moderate diverticulosis in the sigmoid to transverse colon. Sigmoidoscopy in October 2015 again showed moderate diverticulosis in the sigmoid and descending colon. His past medical history includes hyperlipidemia, hypertension, erectile dysfunction, GERD, depression, kidney stones, IBS, esophageal strictures requiring dilation, ischemic colitis, peptic ulcer, duodenal ulcer, coronary artery disease, prostate cancer, status post radical prostatectomy in 2009, urinary incontinence, and degenerative disc disease. He was last seen here on July 20. He had been evaluated by his primary care provider on July 14 with a one to two-week history of left lower quadrant pain. His white blood cell count showed an elevated white count but his sedimentation rate and CRP were normal. He was empirically treated by his PCP with Cipro and Flagyl. He reported that he felt very dizzy with the Flagyl so after 1 day he discontinued the Flagyl but continue taking the Cipro. He had not noticed improvement of his left lower quadrant pain and was reporting pasty stools, anal seepage, and excess gas and flatulence. CT of the abdomen and pelvis was obtained on 12/27/2014 and revealed sigmoid colonic diverticulosis without evidence of acute diverticulitis, as well as a nonobstructing 2 mm stone in the inferior pole of the right kidney. It was felt his discomfort was likely due to spastic diverticular disease and he was given a trial of Bentyl 10 mg 3 times a day along with Benefiber and [employer]. He continued to have loose stools and a stool for C. difficile was obtained which revealed C. difficile. He was started on vancomycin. He returns today. He is having no further  diarrhea but he states his stools are very soft. He complains of anal seepage. He has sensation of incomplete evacuation. He has had no bright red blood per rectum or melena. He denies abdominal pain, nausea, vomiting, fever, or chills.   Past Medical History  Diagnosis Date  . Hyperlipidemia   . Hypertension   . GERD (gastroesophageal reflux disease)   . ED (erectile dysfunction)   . Peyronie's disease   . Depression   . Hemorrhoids 2002  . History of kidney stones 2014    right ureteral lithotripsy 01/2013  . IBS (irritable bowel syndrome)   . Ischemic colitis   . History of esophageal dilatation     FOR STRICTURE 2008  . History of peptic ulcer   . History of duodenal ulcer   . Diverticulosis, sigmoid   . Coronary artery disease CARDIOLOGIST-- DR Rayann Heman    NON-OBSTRUCTIVE CAD--- 2005 Cath with 30% LAD, calcification/plaque of LAD, EF 60%  . History of prostate cancer     S/P RADICAL PROSTATECTOMY 2009  . Right ureteral stone   . SUI (stress urinary incontinence), male   . PAC (premature atrial contraction)   . Adenoma of left adrenal gland     stable per ct  . History of gallstones   . Frequency of urination   . Nocturia   . OA (osteoarthritis)     RIGHT SHOULDER/ HIPS  . DDD (degenerative disc disease), lumbosacral   . 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 Wears glasses   . Eczema   . Encopresis     Past Surgical History  Procedure Laterality Date  .  Nasal septum surgery  YRS AGO  . Flexible sigmoidoscopy N/A 03/19/2014    Procedure: FLEXIBLE SIGMOIDOSCOPY;  Surgeon: Inda Castle, MD;  Location: [facility];  Service: Endoscopy;  Laterality: N/A;  . Radical prostatectomy/ bilateral retroperitoneal lymph node dissection  12-29-2007  . Tonsillectomy and adenoidectomy  as child  . Lumbar fusion  03-26-2005    Revision laminectomy L5 - S1,  Laminectomy and Decompression L4-L5, fusion L4 -- S1  . Removal benign right eyelid lesion  12-19-2007  .  Cardiovascular stress test  12-23-2012  DR ALLRED    Low risk lexiscan study with small mild intensity, fixed inferior defect consistent with thinning, no ischemia/  normal LVF and wall motion, ef 66%  . Transthoracic echocardiogram  07-13-2014    mild LVH/  ef 60-655,  grade I diastolic dysfunction/  trivial AR, MR and TR/  mild LAE  . Cardiac catheterization  07-23-2003    Non-obstructive CAD/  30% narrowing ostium LM,  30-40% eccentric calcified plaque LAD, 20% narrowing RCA/  preserved LVF  . Inguinal hernia repair Bilateral 03-17-2010  . Sling cystourethropexy (advance)  10-12-2009  . Lumbar laminectomy  1999    L5  -- S1  . Extracorporeal shock wave lithotripsy Right 01-30-2013  . Esophagogastroduodenoscopy (egd) with esophageal dilation  02-04-2001  . Colonoscopy  last one 06-19-2011  . Cystoscopy/retrograde/ureteroscopy/stone extraction with basket Right 07/19/2014    Procedure: RIGHT URETEROSCOPY/STONE EXTRACTION WITH BASKET;  Surgeon: Malka So, MD;  Location: [facility];  Service: Urology;  Laterality: Right;  . Cystoscopy w/ retrogrades Right 07/19/2014    Procedure: CYSTOSCOPY WITH RETROGRADE PYELOGRAM;  Surgeon: Malka So, MD;  Location: [facility];  Service: Urology;  Laterality: Right;   Family History  Problem Relation Age of Onset  . Colon cancer Maternal Aunt 35  . Heart attack Father 83  . Aneurysm Father   . Lymphoma Maternal Aunt   . Bone cancer Maternal Grandfather    Social History  Substance Use Topics  . Smoking status: Current Every Day Smoker -- 1.00 packs/day for 53 years    Types: Cigarettes  . Smokeless tobacco: Never Used  . Alcohol Use: No   Current Outpatient Prescriptions  Medication Sig Dispense Refill  . dicyclomine (BENTYL) 10 MG capsule Take one capsule by mouth before meals three times a day 270 capsule 1  . diltiazem (TIAZAC) 360 MG 24 hr capsule Take 1 capsule (360 mg total) by mouth daily. 90 capsule 0    . HYDROcodone-acetaminophen (NORCO/VICODIN) 5-325 MG per tablet Take 1 tablet by mouth every 6 (six) hours as needed for moderate pain. 30 tablet 0  . metoprolol succinate (TOPROL-XL) 25 MG 24 hr tablet Take 1 tablet (25 mg total) by mouth 2 (two) times daily. 180 tablet 0  . Multiple Vitamin (MULTIVITAMIN WITH MINERALS) TABS tablet Take 1 tablet by mouth daily.    Marland Kitchen omeprazole (PRILOSEC) 20 MG capsule Take 1 capsule (20 mg total) by mouth 2 (two) times daily. 180 capsule 3  . oxymetazoline (AFRIN) 0.05 % nasal spray Place 2 sprays into the nose at bedtime as needed for congestion.    . pravastatin (PRAVACHOL) 40 MG tablet TAKE 1 TABLET (40 MG TOTAL) EVERY EVENING. 90 tablet 0  . Probiotic Product (PROBIOTIC DAILY PO) Take 1 tablet by mouth daily.    . psyllium (REGULOID) 0.52 G capsule Take 0.52 g by mouth 2 (two) times daily.    Marland Kitchen saccharomyces boulardii (FLORASTOR) 250 MG  capsule Take 2 capsules (500 mg total) by mouth 2 (two) times daily. 120 capsule 0  . topiramate (TOPAMAX) 25 MG tablet Take 25 mg by mouth daily as needed (for headaches).     . traZODone (DESYREL) 50 MG tablet TAKE 1 TABLET BY MOUTH EVERY NIGHT AT BEDTIME 90 tablet 2  . vancomycin (VANCOCIN) 50 mg/mL oral solution Take 5 mLs (250 mg total) by mouth 4 (four) times daily. 140 mL 0   No current facility-administered medications for this visit.   Allergies  Allergen Reactions  . Pseudoephedrine Palpitations    "Heart races"  . Tape Other (See Comments)    tears skin, can use paper tape  . Sulfa Antibiotics Itching  . Oxycodone Other (See Comments)    "over sedates"  . Lodine [Etodolac] Diarrhea     Review of Systems: Per history of present illness otherwise negative.  LAB RESULTS: Blood work 12/26/2014 revealed a CBC with WBC 9.3, hemoglobin 15.1, hematocrit 45.3, platelets 295,000. Sedimentation rate 9 Stool for C. difficile on 01/03/2015 positive  Studies:      CLINICAL DATA: Patient with left lower  quadrant pain. History of diverticulitis. Headache and duodenum ulcers.  EXAM: CT ABDOMEN AND PELVIS WITH CONTRAST  TECHNIQUE: Multidetector CT imaging of the abdomen and pelvis was performed using the standard protocol following bolus administration of intravenous contrast.  CONTRAST: 155mL OMNIPAQUE IOHEXOL 300 MG/ML SOLN  COMPARISON: CT abdomen pelvis 07/09/2014  FINDINGS: Lower chest: No consolidative or nodular pulmonary opacities. Normal heart size. Coronary arterial vascular calcifications.  Hepatobiliary: Liver is normal in size and contour. No focal hepatic lesion is identified. Gallbladder is unremarkable. No intrahepatic or extrahepatic biliary ductal dilatation.  Pancreas: Unremarkable  Spleen: Unremarkable  Adrenals/Urinary Tract: Stable left adrenal adenoma. Right adrenal gland is unremarkable. Re- demonstrated multiple cysts within the left kidney. Kidneys enhance symmetrically with contrast. No hydronephrosis. Unchanged 2 mm stone inferior pole right kidney. Urinary bladder is grossly unremarkable.  Stomach/Bowel: Sigmoid colonic diverticulosis. No CT evidence to suggest acute diverticulitis. No free fluid or free intraperitoneal air.  Vascular/Lymphatic: Peripheral calcified atherosclerotic plaque involving the abdominal aorta which is otherwise normal in caliber. No retroperitoneal lymphadenopathy identified.  Other: Fat containing left inguinal hernia. Status post prostatectomy.  Musculoskeletal: L4-S1 posterior spinal fusion. No aggressive or acute appearing osseous lesions.  IMPRESSION: Sigmoid colonic diverticulosis without evidence for acute diverticulitis.  Nonobstructing 2 mm stone inferior pole right kidney.   Electronically Signed  By: Lovey Newcomer M.D.  On: 12/27/2014 14:02       Physical Exam: BP 136/64 mmHg  Pulse 64  Ht 5' 4.5" (1.638 m)  Wt 131 lb 6 oz (59.591 kg)  BMI 22.21 kg/m2 General:  Pleasant, well developed , male in no acute distress Head: Normocephalic and atraumatic Eyes:  sclerae anicteric, conjunctiva pink  Ears: Normal auditory acuity Lungs: Clear throughout to auscultation Heart: Regular rate and rhythm Abdomen: Soft, non distended, mild left lower quadrant tenderness to palpation but no rebound or guarding, No masses, no hepatomegaly. Normal bowel sounds Musculoskeletal: Symmetrical with no gross deformities  Extremities: No edema  Neurological: Alert oriented x 4, grossly nonfocal Psychological:  Alert and cooperative. Normal mood and affect  Assessment and Recommendations: #1. C. difficile diarrhea. Patient has been instructed to finish his vancomycin 125 mg 4 times a day. He has 3 or 4 more days. He has been instructed to continue [employer] 250 mg one capsule twice daily for 2-4 weeks after completion of his vancomycin.  #2.  Left lower quadrant pain. This is been intermittent and is likely due to some spastic diverticular disease. He has been instructed to try Benefiber 1 heaping tablespoon in 6 ounces of water before breakfast and lunch to try to bulk his stool.  #3. Anal seepage. Since likely due to his internal hemorrhoids. Again patient has been instructed to use a bulking agent such as Benefiber.  Patient has been instructed to contact us immediately if he experiences recurrent diarrhea so that a stool sample for C. difficile can be obtained. Otherwise, he will adhere to a high-fiber low-fat diet and we'll follow up in 2 months, sooner if needed.        Deysi Soldo, Deloris Ping 01/31/2015,

## 2015-02-01 NOTE — Progress Notes (Signed)
Reviewed and agree with management plan.  Marcos Ruelas T. Noel Rodier, MD FACG 

## 2015-02-07 ENCOUNTER — Telehealth: Payer: Self-pay | Admitting: Internal Medicine

## 2015-02-07 NOTE — Telephone Encounter (Signed)
Lauren from [employer] stated there have been a claim filed to patient, because his referral was denied because the order was not put in. Any questions call Ander Purpura [phone] To appeal claim, Claim # [id number]  Fax # [phone]

## 2015-02-28 DIAGNOSIS — Z79891 Long term (current) use of opiate analgesic: Secondary | ICD-10-CM | POA: Diagnosis not present

## 2015-02-28 DIAGNOSIS — M25512 Pain in left shoulder: Secondary | ICD-10-CM | POA: Diagnosis not present

## 2015-02-28 DIAGNOSIS — G894 Chronic pain syndrome: Secondary | ICD-10-CM | POA: Diagnosis not present

## 2015-02-28 DIAGNOSIS — Z79899 Other long term (current) drug therapy: Secondary | ICD-10-CM | POA: Diagnosis not present

## 2015-02-28 DIAGNOSIS — M25511 Pain in right shoulder: Secondary | ICD-10-CM | POA: Diagnosis not present

## 2015-02-28 DIAGNOSIS — Z6821 Body mass index (BMI) 21.0-21.9, adult: Secondary | ICD-10-CM | POA: Diagnosis not present

## 2015-02-28 DIAGNOSIS — I10 Essential (primary) hypertension: Secondary | ICD-10-CM | POA: Diagnosis not present

## 2015-03-01 ENCOUNTER — Ambulatory Visit: Payer: Commercial Managed Care - HMO | Admitting: Cardiology

## 2015-03-06 ENCOUNTER — Ambulatory Visit: Payer: Commercial Managed Care - HMO | Admitting: Cardiology

## 2015-03-30 ENCOUNTER — Other Ambulatory Visit: Payer: Self-pay | Admitting: Cardiology

## 2015-04-01 ENCOUNTER — Ambulatory Visit: Payer: Commercial Managed Care - HMO | Admitting: Gastroenterology

## 2015-04-15 ENCOUNTER — Telehealth: Payer: Self-pay | Admitting: Internal Medicine

## 2015-04-15 NOTE — Telephone Encounter (Signed)
Patient wants to make sure that 04/18/2015 visit to dr stark. It appears that a [place] in on there, but he wants to make sure based on issues that he has had in the past

## 2015-04-18 ENCOUNTER — Ambulatory Visit (INDEPENDENT_AMBULATORY_CARE_PROVIDER_SITE_OTHER): Payer: Commercial Managed Care - HMO | Admitting: Gastroenterology

## 2015-04-18 ENCOUNTER — Encounter: Payer: Self-pay | Admitting: Gastroenterology

## 2015-04-18 VITALS — BP 150/74 | HR 84 | Ht 64.5 in | Wt 136.5 lb

## 2015-04-18 DIAGNOSIS — K589 Irritable bowel syndrome without diarrhea: Secondary | ICD-10-CM

## 2015-04-18 DIAGNOSIS — A047 Enterocolitis due to Clostridium difficile: Secondary | ICD-10-CM | POA: Diagnosis not present

## 2015-04-18 DIAGNOSIS — A0472 Enterocolitis due to Clostridium difficile, not specified as recurrent: Secondary | ICD-10-CM

## 2015-04-18 NOTE — Progress Notes (Signed)
    History of Present Illness: This is a 74 year old male returning for follow-up after treatment for C. difficile. He has ongoing variability in his bowel habits from a bowel movement every other day to 2 or 3 looser stools per day. He has occasional LLQ pain. He is no longer taking fiber supplements as previously recommended. Completed a course of vancomycin about 6 weeks ago. His persistent diarrhea has completely resolved.  Current Medications, Allergies, Past Medical History, Past Surgical History, Family History and Social History were reviewed in [employer] record.  Physical Exam: General: Well developed, well nourished, no acute distress Head: Normocephalic and atraumatic Eyes:  sclerae anicteric, EOMI Ears: Normal auditory acuity Mouth: No deformity or lesions Lungs: Clear throughout to auscultation Heart: Regular rate and rhythm; no murmurs, rubs or bruits Abdomen: Soft, non tender and non distended. No masses, hepatosplenomegaly or hernias noted. Normal Bowel sounds Musculoskeletal: Symmetrical with no gross deformities  Pulses:  Normal pulses noted Extremities: No clubbing, cyanosis, edema or deformities noted Neurological: Alert oriented x 4, grossly nonfocal Psychological:  Alert and cooperative. Normal mood and affect  Assessment and Recommendations:  1. C. difficile diarrhea, resolved. Patient completed vancomycin several weeks ago. Discontinue Florastor after completing current monthly supply.  2. Variable bowel habits and intermittent left lower quadrant pain c/w IBS. Resume Metamucil tablets 4 per day or Benefiber 1 heaping tablespoon in 8 ounces of water daily. Continue dicyclomine 10 mg 3 times a day before meals.

## 2015-04-18 NOTE — Patient Instructions (Signed)
Stop your Florastor after you have finished your current prescription.   Use Benefiber daily.   Thank you for choosing me and [facility].  Pricilla Riffle. Dagoberto Ligas., MD., Marval Regal

## 2015-05-04 ENCOUNTER — Telehealth: Payer: Self-pay

## 2015-05-04 NOTE — Telephone Encounter (Signed)
Pt is on list for AWV for 2016

## 2015-05-06 NOTE — Telephone Encounter (Signed)
Call to schedule AWV and LVM for the patient to call back to educate and schedule for wellness visit.

## 2015-05-22 NOTE — Telephone Encounter (Signed)
auth # [id number]

## 2015-06-21 ENCOUNTER — Encounter: Payer: Self-pay | Admitting: *Deleted

## 2015-06-21 ENCOUNTER — Ambulatory Visit (INDEPENDENT_AMBULATORY_CARE_PROVIDER_SITE_OTHER): Payer: Medicare HMO | Admitting: Cardiology

## 2015-06-21 ENCOUNTER — Encounter: Payer: Self-pay | Admitting: Cardiology

## 2015-06-21 VITALS — BP 164/80 | HR 66 | Ht 66.0 in | Wt 138.0 lb

## 2015-06-21 DIAGNOSIS — I10 Essential (primary) hypertension: Secondary | ICD-10-CM

## 2015-06-21 DIAGNOSIS — I2510 Atherosclerotic heart disease of native coronary artery without angina pectoris: Secondary | ICD-10-CM

## 2015-06-21 DIAGNOSIS — R29898 Other symptoms and signs involving the musculoskeletal system: Secondary | ICD-10-CM

## 2015-06-21 DIAGNOSIS — R002 Palpitations: Secondary | ICD-10-CM

## 2015-06-21 DIAGNOSIS — E785 Hyperlipidemia, unspecified: Secondary | ICD-10-CM

## 2015-06-21 NOTE — Patient Instructions (Signed)
Medication Instructions:  No changes today  Labwork: Your physician recommends that you return for a FASTING lipid profile/liver profile in 2 months.   Testing/Procedures: Your physician has requested that you have a lower  extremity arterial duplex. This test is an ultrasound of the arteries in the legs. It looks at arterial blood flow in the legs . Allow one hour for Lower  Arterial scans. There are no restrictions or special instructions   Follow-Up: Your physician wants you to follow-up in: 1 year with Dr Aundra Dubin. (January 2018).  You will receive a reminder letter in the mail two months in advance. If you don't receive a letter, please call our office to schedule the follow-up appointment.   Any Other Special Instructions Will Be Listed Below (If Applicable). Your physician has requested that you regularly monitor and record your blood pressure readings at home. Please use the same machine at the same time of day to check your readings and record them. I will call you in 2-3 weeks to get the readings.       If you need a refill on your cardiac medications before your next appointment, please call your pharmacy.

## 2015-06-22 NOTE — Progress Notes (Signed)
Patient ID: [name], male   DOB: 02-14-1941, 75 y.o.   MRN: [id number] PCP: Dr. Joaquim Lai  75 yo with history of HTN, hyperlipidemia, nonobstructive CAD, and palpitations due to PACs/PVCs presents for followup. He has been on Toprol XL and diltiazem CD for the palpitations.  Palpitations have been relatively quiescent.  No exertional dyspnea.  No syncope or lightheadedness.  He continues to be under a lot of stress taking care of his disabled wife.  He does tire easily with exertion.  He had a Lexiscan Cardiolite in 7/14 for atypical chest pain that was low risk.  He continues to smoke.  His legs have felt weak long-term, no pain.  Pulses are good.  Stopping statin makes no difference.   He is now taking pravastatin.  He has chronic daily headaches and takes Excedrin.   Labs (11/10): TSH normal, LDL 80, HDL 43  Labs (11/12): K 4.1, creatinine 0.8, LDL 69, HDL 47 Labs (7/13): Mg 2.3, TSH normal, LDL 64, HDL 54, K 4, creatinine 0.9 Labs (7/16): K 4.2, creatinine 0.74, HCT 45.3  ECG: NSR, normal  Allergies (verified):  1) ! Sudafed  2) ! Lodine  3) ! Sulfa   Past Medical History:  1. Nonobstructive coronary artery disease: The patient's left heart catheterization done in 2005 showed 30% left main stenosis, an extensively calcified LAD with moderate plaquing, and EF was 60%. There was no obstructive disease.  Lexiscan Cardiolite in 7/14 showed a small, fixed inferior defect with no ischemia, EF 66% (low risk study).  Echo (2/16) with EF 60-65%, mild LVH, normal RV size and systolic function.  2. Hypertension.  3. PAC/PVCs. [place] (7/13) with frequent PACs (5.4% total beats), no atrial fibrillation noted.  4. Peptic ulcer disease.  5. Gastroesophageal reflux disease with history of stricture.  6. Hypertension.  7. Hyperlipidemia.  8. Tobacco abuse. The patient smokes pack a day. He has been off Chantix in the past. However, he did not want to use Chantix due to fear for side effects  9.  Diverticulosis.  10. Prostate cancer status post radical prostatectomy in July 2009.  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 Depression Dx 3-09  13. Diastolic dysfunction: Echo (2/10) with EF 60%, mild focal basal septal hypertrophy, moderate diastolic dysfunction.  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 Lumber degenerative disc disease 16. C difficile colitis 17. Nephrolithiasis 18. H/o ischemic colitis 19. Chronic daily headache 20. H/o ischemic colitis  Family History:  colon ca--aunt  prostate ca--no  MI--F at age 91   Social History:  Married, wife is disabled  2 kids  fully retired  Patient currently smokes 1ppd  Alcohol Use - no  Illicit Drug Use - no  Patient does not get regular exercise but is active   ROS: All systems reviewed and negative except as per HPI.    Current Outpatient Prescriptions  Medication Sig Dispense Refill  . dicyclomine (BENTYL) 10 MG capsule Take one capsule by mouth before meals three times a day 270 capsule 1  . diltiazem (TIAZAC) 360 MG 24 hr capsule Take 360 mg by mouth daily.    Marland Kitchen HYDROcodone-acetaminophen (NORCO/VICODIN) 5-325 MG per tablet Take 1 tablet by mouth every 6 (six) hours as needed for moderate pain. 30 tablet 0  . metoprolol succinate (TOPROL-XL) 25 MG 24 hr tablet Take 25 mg by mouth 2 (two) times daily.    . Multiple Vitamin (MULTIVITAMIN WITH MINERALS) TABS tablet Take 1 tablet by mouth daily.    Marland Kitchen omeprazole (  PRILOSEC) 20 MG capsule Take 1 capsule (20 mg total) by mouth 2 (two) times daily. 180 capsule 3  . oxymetazoline (AFRIN) 0.05 % nasal spray Place 2 sprays into the nose at bedtime as needed for congestion.    . pravastatin (PRAVACHOL) 40 MG tablet Take 40 mg by mouth every evening.    . topiramate (TOPAMAX) 25 MG tablet Take 25 mg by mouth daily as needed (for headaches).     . traZODone (DESYREL) 50 MG tablet TAKE 1 TABLET BY MOUTH EVERY NIGHT AT BEDTIME 90 tablet 2   No current facility-administered medications for this visit.   BP  164/80 mmHg  Pulse 66  Ht 5\' 6"  (1.676 m)  Wt 138 lb (62.596 kg)  BMI 22.28 kg/m2 General: NAD, thin Neck: No JVD, no thyromegaly or thyroid nodule.  Lungs: Clear to auscultation bilaterally with normal respiratory effort. CV: Nondisplaced PMI.  Heart regular S1/S2 with occasional premature beat, no S3/S4, no murmur.  No peripheral edema.  No carotid bruit.  Normal pedal pulses.  Abdomen: Soft, nontender, no hepatosplenomegaly, no distention.  Neurologic: Alert and oriented x 3.  Psych: Normal affect. Extremities: No clubbing or cyanosis.   Assessment/Plan: 1. Palpitations:  Due to PACS/PVCs.  This seems to have mostly resolved on Toprol XL and diltiazem CD.   2. Hyperlipidemia: He has nonobstructive CAD and is on pravastatin now (started about 2 wks ago).  Need to check lipids/LFTs in 2 months. 3. CAD: Nonobstructive CAD on last cath.  No recent chest pain.  Low risk Cardiolite in 7/14.  Continue ASA 81 and statin.   4. Smoking: I again encouraged him to stop smoking.  I do not think he is ready.  5. Leg weakness: Probably from degenerative disc disease.  I can palpate his pedal pulses.  He has never had ABIs done, I will arrange.   6. HTN: BP high today but says that it has been normal at other checks.  Will have him check BP at home for 2 weeks, then will call for readings.   Loralie Champagne 06/22/2015

## 2015-06-25 ENCOUNTER — Other Ambulatory Visit: Payer: Self-pay | Admitting: Cardiology

## 2015-06-25 DIAGNOSIS — R29898 Other symptoms and signs involving the musculoskeletal system: Secondary | ICD-10-CM

## 2015-07-03 ENCOUNTER — Ambulatory Visit (HOSPITAL_COMMUNITY)
Admission: RE | Admit: 2015-07-03 | Discharge: 2015-07-03 | Disposition: A | Discharge status: Home / Self Care | Payer: Medicare HMO | Source: Ambulatory Visit | Attending: Cardiology | Admitting: Cardiology

## 2015-07-03 DIAGNOSIS — E785 Hyperlipidemia, unspecified: Secondary | ICD-10-CM | POA: Insufficient documentation

## 2015-07-03 DIAGNOSIS — I10 Essential (primary) hypertension: Secondary | ICD-10-CM | POA: Insufficient documentation

## 2015-07-03 DIAGNOSIS — I739 Peripheral vascular disease, unspecified: Secondary | ICD-10-CM | POA: Diagnosis not present

## 2015-07-03 DIAGNOSIS — R29898 Other symptoms and signs involving the musculoskeletal system: Secondary | ICD-10-CM | POA: Insufficient documentation

## 2015-07-05 ENCOUNTER — Other Ambulatory Visit: Payer: Self-pay | Admitting: *Deleted

## 2015-07-05 MED ORDER — DILTIAZEM HCL ER BEADS 360 MG PO CP24: 360.0000 mg | ORAL_CAPSULE | Freq: Every day | ORAL | Status: DC

## 2015-07-09 ENCOUNTER — Telehealth: Payer: Self-pay | Admitting: *Deleted

## 2015-07-09 NOTE — Telephone Encounter (Signed)
Copied from Dr Claris Gladden 06/21/15 office note:  HTN: BP high today but says that it has been normal at other checks. Will have him check BP at home for 2 weeks, then will call for readings.   07/09/15 LMTCB for pt to get BP readings.

## 2015-07-15 NOTE — Telephone Encounter (Signed)
LMTCB

## 2015-07-16 NOTE — Telephone Encounter (Signed)
LMTCB

## 2015-07-19 NOTE — Telephone Encounter (Signed)
LMTCB today, have been unable to get in touch with patient after numerous attempts.

## 2015-07-24 ENCOUNTER — Telehealth: Payer: Self-pay | Admitting: *Deleted

## 2015-07-24 ENCOUNTER — Telehealth: Payer: Self-pay | Admitting: Cardiology

## 2015-07-24 MED ORDER — DICYCLOMINE HCL 10 MG PO CAPS: ORAL_CAPSULE | ORAL | Status: DC

## 2015-07-24 MED ORDER — OMEPRAZOLE 20 MG PO CPDR: 20.0000 mg | DELAYED_RELEASE_CAPSULE | Freq: Two times a day (BID) | ORAL | Status: DC

## 2015-07-24 NOTE — Telephone Encounter (Signed)
Receive call pt states he no longer using [facility] needing 2 rx's sent to [facility]. Verified medications sending omprazole & Dicyclomine to [facility]...Johny Chess

## 2015-07-24 NOTE — Telephone Encounter (Signed)
*  STAT* If patient is at the pharmacy, call can be transferred to refill team.   1. Which medications need to be refilled? (please list name of each medication and dose if known) pravastatin 40, metoprolol 25  2. Which pharmacy/location (including street and city if local pharmacy) is medication to be sent to?[place]  3. Do they need a 30 day or 90 day supply? [place]

## 2015-07-25 MED ORDER — PRAVASTATIN SODIUM 40 MG PO TABS: 40.0000 mg | ORAL_TABLET | Freq: Every evening | ORAL | Status: DC

## 2015-07-25 MED ORDER — METOPROLOL SUCCINATE ER 25 MG PO TB24: 25.0000 mg | ORAL_TABLET | Freq: Two times a day (BID) | ORAL | Status: DC

## 2015-07-25 NOTE — Telephone Encounter (Signed)
Pt's Rx was sent to pt's pharmacy as requested. Confirmation received.  °

## 2015-08-07 ENCOUNTER — Ambulatory Visit (INDEPENDENT_AMBULATORY_CARE_PROVIDER_SITE_OTHER): Payer: Medicare HMO | Admitting: Internal Medicine

## 2015-08-07 ENCOUNTER — Encounter: Payer: Self-pay | Admitting: Internal Medicine

## 2015-08-07 ENCOUNTER — Other Ambulatory Visit (INDEPENDENT_AMBULATORY_CARE_PROVIDER_SITE_OTHER): Payer: Medicare HMO

## 2015-08-07 VITALS — BP 148/78 | HR 61 | Temp 99.2°F | Resp 16 | Ht 66.0 in | Wt 138.0 lb

## 2015-08-07 DIAGNOSIS — I2510 Atherosclerotic heart disease of native coronary artery without angina pectoris: Secondary | ICD-10-CM | POA: Diagnosis not present

## 2015-08-07 DIAGNOSIS — I10 Essential (primary) hypertension: Secondary | ICD-10-CM

## 2015-08-07 DIAGNOSIS — J301 Allergic rhinitis due to pollen: Secondary | ICD-10-CM | POA: Insufficient documentation

## 2015-08-07 DIAGNOSIS — C61 Malignant neoplasm of prostate: Secondary | ICD-10-CM | POA: Diagnosis not present

## 2015-08-07 DIAGNOSIS — Z23 Encounter for immunization: Secondary | ICD-10-CM | POA: Diagnosis not present

## 2015-08-07 DIAGNOSIS — R040 Epistaxis: Secondary | ICD-10-CM

## 2015-08-07 LAB — CBC WITH DIFFERENTIAL/PLATELET
BASOS ABS: 0.1 10*3/uL (ref 0.0–0.1)
BASOS PCT: 0.7 % (ref 0.0–3.0)
EOS ABS: 0.3 10*3/uL (ref 0.0–0.7)
Eosinophils Relative: 3.2 % (ref 0.0–5.0)
HEMATOCRIT: 41.1 % (ref 39.0–52.0)
HEMOGLOBIN: 13.8 g/dL (ref 13.0–17.0)
LYMPHS PCT: 17 % (ref 12.0–46.0)
Lymphs Abs: 1.7 10*3/uL (ref 0.7–4.0)
MCHC: 33.6 g/dL (ref 30.0–36.0)
MCV: 91 fl (ref 78.0–100.0)
Monocytes Absolute: 0.7 10*3/uL (ref 0.1–1.0)
Monocytes Relative: 6.4 % (ref 3.0–12.0)
Neutro Abs: 7.5 10*3/uL (ref 1.4–7.7)
Neutrophils Relative %: 72.7 % (ref 43.0–77.0)
Platelets: 313 10*3/uL (ref 150.0–400.0)
RBC: 4.52 Mil/uL (ref 4.22–5.81)
RDW: 14 % (ref 11.5–15.5)
WBC: 10.3 10*3/uL (ref 4.0–10.5)

## 2015-08-07 LAB — BASIC METABOLIC PANEL
BUN: 17 mg/dL (ref 6–23)
CHLORIDE: 104 meq/L (ref 96–112)
CO2: 27 meq/L (ref 19–32)
Calcium: 9.7 mg/dL (ref 8.4–10.5)
Creatinine, Ser: 0.73 mg/dL (ref 0.40–1.50)
GFR: 111.09 mL/min (ref >60.00)
GLUCOSE: 99 mg/dL (ref 70–99)
POTASSIUM: 4 meq/L (ref 3.5–5.1)
SODIUM: 139 meq/L (ref 135–145)

## 2015-08-07 LAB — LIPID PANEL
CHOL/HDL RATIO: 3
Cholesterol: 142 mg/dL (ref 0–200)
HDL: 45.7 mg/dL (ref >39.00)
LDL Cholesterol: 70 mg/dL (ref 0–99)
NONHDL: 96.35
TRIGLYCERIDES: 133 mg/dL (ref 0.0–149.0)
VLDL: 26.6 mg/dL (ref 0.0–40.0)

## 2015-08-07 LAB — TSH: TSH: 1.26 u[IU]/mL (ref 0.35–4.50)

## 2015-08-07 MED ORDER — CETIRIZINE HCL 10 MG PO TABS: 10.0000 mg | ORAL_TABLET | Freq: Every day | ORAL | Status: DC

## 2015-08-07 MED ORDER — METHYLPREDNISOLONE ACETATE 80 MG/ML IJ SUSP
120.0000 mg | Freq: Once | INTRAMUSCULAR | Status: AC
  Administered 2015-08-07: 120 mg via INTRAMUSCULAR

## 2015-08-07 NOTE — Progress Notes (Signed)
Subjective:  Patient ID: [name], male    DOB: Jun 26, 1940  Age: 75 y.o. MRN: [id number]  CC: Allergic Rhinitis ; Hypertension; and Hyperlipidemia   HPI PANFILO SIBAJA presents for a follow-up on hypertension and high cholesterol.  He also complains of a several week history of worsening nasal congestion and sneezing with intermittent epistaxis. He tells me that years ago he saw an ENT surgeon because he had an ulcer and an opening in his septum. He has been trying Nasacort and Flonase without much symptom relief.  His blood pressure has been well controlled with a combination of metoprolol and diltiazem. He denies any episodes of headache, blurred vision, chest pain, shortness of breath, or edema.  Outpatient Prescriptions Prior to Visit  Medication Sig Dispense Refill  . dicyclomine (BENTYL) 10 MG capsule Take one capsule by mouth before meals three times a day 270 capsule 1  . diltiazem (TIAZAC) 360 MG 24 hr capsule Take 1 capsule (360 mg total) by mouth daily. 90 capsule 3  . metoprolol succinate (TOPROL-XL) 25 MG 24 hr tablet Take 1 tablet (25 mg total) by mouth 2 (two) times daily. 180 tablet 3  . Multiple Vitamin (MULTIVITAMIN WITH MINERALS) TABS tablet Take 1 tablet by mouth daily.    Marland Kitchen omeprazole (PRILOSEC) 20 MG capsule Take 1 capsule (20 mg total) by mouth 2 (two) times daily. 90 capsule 1  . pravastatin (PRAVACHOL) 40 MG tablet Take 1 tablet (40 mg total) by mouth every evening. 90 tablet 3  . topiramate (TOPAMAX) 25 MG tablet Take 25 mg by mouth daily as needed (for headaches).     . traZODone (DESYREL) 50 MG tablet TAKE 1 TABLET BY MOUTH EVERY NIGHT AT BEDTIME 90 tablet 2  . HYDROcodone-acetaminophen (NORCO/VICODIN) 5-325 MG per tablet Take 1 tablet by mouth every 6 (six) hours as needed for moderate pain. 30 tablet 0  . oxymetazoline (AFRIN) 0.05 % nasal spray Place 2 sprays into the nose at bedtime as needed for congestion.     No facility-administered medications prior  to visit.    ROS Review of Systems  Constitutional: Negative.  Negative for fever, diaphoresis, activity change, appetite change and fatigue.  HENT: Positive for congestion, nosebleeds, postnasal drip, rhinorrhea and sneezing. Negative for facial swelling, sinus pressure, sore throat, tinnitus and trouble swallowing.   Eyes: Negative.   Respiratory: Negative.  Negative for cough, choking, chest tightness, shortness of breath and stridor.   Cardiovascular: Negative.  Negative for chest pain, palpitations and leg swelling.  Gastrointestinal: Negative.  Negative for nausea, vomiting, abdominal pain, diarrhea, constipation and blood in stool.  Endocrine: Negative.   Genitourinary: Negative.  Negative for hematuria.  Musculoskeletal: Negative.  Negative for myalgias, back pain, joint swelling, arthralgias and neck pain.  Skin: Negative.  Negative for color change and rash.  Allergic/Immunologic: Negative.   Neurological: Negative.  Negative for dizziness.  Hematological: Negative.  Negative for adenopathy. Does not bruise/bleed easily.  Psychiatric/Behavioral: Negative.     Objective:  BP 148/78 mmHg  Pulse 61  Temp(Src) 99.2 F (37.3 C) (Oral)  Resp 16  Ht 5\' 6"  (1.676 m)  Wt 138 lb (62.596 kg)  BMI 22.28 kg/m2  SpO2 96%  BP Readings from Last 3 Encounters:  08/07/15 148/78  06/21/15 164/80  04/18/15 150/74    Wt Readings from Last 3 Encounters:  08/07/15 138 lb (62.596 kg)  06/21/15 138 lb (62.596 kg)  04/18/15 136 lb 8 oz (61.916 kg)    Physical  Exam  Constitutional: He is oriented to person, place, and time. No distress.  HENT:  Nose: Mucosal edema present. No rhinorrhea, sinus tenderness or nasal septal hematoma. No epistaxis. Right sinus exhibits no maxillary sinus tenderness and no frontal sinus tenderness. Left sinus exhibits no maxillary sinus tenderness and no frontal sinus tenderness.  Mouth/Throat: Oropharynx is clear and moist and mucous membranes are normal.  Mucous membranes are not pale, not dry and not cyanotic. No oral lesions. No trismus in the jaw. No uvula swelling. No oropharyngeal exudate, posterior oropharyngeal edema, posterior oropharyngeal erythema or tonsillar abscesses.  Nasal mucosa is is erythematous and slightly swollen but there are no discrete lesions  Eyes: Conjunctivae are normal. Right eye exhibits no discharge. Left eye exhibits no discharge. No scleral icterus.  Neck: Normal range of motion. Neck supple. No JVD present. No tracheal deviation present. No thyromegaly present.  Cardiovascular: Normal rate, regular rhythm, normal heart sounds and intact distal pulses.  Exam reveals no gallop and no friction rub.   No murmur heard. Pulmonary/Chest: Effort normal and breath sounds normal. No stridor. No respiratory distress. He has no wheezes. He has no rales. He exhibits no tenderness.  Abdominal: Soft. Bowel sounds are normal. He exhibits no distension and no mass. There is no tenderness. There is no rebound and no guarding.  Musculoskeletal: Normal range of motion. He exhibits no edema.  Lymphadenopathy:    He has no cervical adenopathy.  Neurological: He is oriented to person, place, and time.  Skin: Skin is warm and dry. No rash noted. He is not diaphoretic. No erythema. No pallor.  Psychiatric: He has a normal mood and affect. His behavior is normal. Judgment and thought content normal.  Vitals reviewed.   Lab Results  Component Value Date   WBC 10.3 08/07/2015   HGB 13.8 08/07/2015   HCT 41.1 08/07/2015   PLT 313.0 08/07/2015   GLUCOSE 99 08/07/2015   CHOL 142 08/07/2015   TRIG 133.0 08/07/2015   HDL 45.70 08/07/2015   LDLDIRECT 134.5 08/22/2007   LDLCALC 70 08/07/2015   ALT 21 06/16/2014   AST 23 06/16/2014   NA 139 08/07/2015   K 4.0 08/07/2015   CL 104 08/07/2015   CREATININE 0.73 08/07/2015   BUN 17 08/07/2015   CO2 27 08/07/2015   TSH 1.26 08/07/2015   PSA 6.69* 08/22/2007   INR 0.97 01/16/2014     No results found.  Assessment & Plan:   Harwell was seen today for allergic rhinitis , hypertension and hyperlipidemia.  Diagnoses and all orders for this visit:  Essential hypertension- his blood pressure is well-controlled, electrolytes and renal function are stable. -     CBC with Differential/Platelet; Future -     Basic metabolic panel; Future -     TSH; Future  Coronary artery disease involving native coronary artery of native heart without angina pectoris - he's had no recent episodes of chest pain or shortness of breath, he is achieved his LDL goal, will continued to treat his risk factors with statin therapy, blood pressure control, and an aspirin a day. -     Lipid panel; Future  ADENOCARCINOMA, PROSTATE- he tells me that his prostate cancer is followed by urology and did not want have a PSA done today. -     Cancel: PSA; Future  Epistaxis- will refer to ENT to consider rhinoscopy to evaluate for secondary causes of epistaxis -     Ambulatory referral to ENT  Allergic rhinitis due to  pollen- he is having a flareup of his nasal allergies so I gave him an injection of Depo-Medrol and asked him to start using plain Zyrtec -     cetirizine (ZYRTEC) 10 MG tablet; Take 1 tablet (10 mg total) by mouth daily. -     methylPREDNISolone acetate (DEPO-MEDROL) injection 120 mg; Inject 1.5 mLs (120 mg total) into the muscle once.  Need for influenza vaccination -     Flu Vaccine QUAD 36+ mos IM  Need for vaccination with 13-polyvalent pneumococcal conjugate vaccine -     Pneumococcal conjugate vaccine 13-valent   I have discontinued Mr. Reek's oxymetazoline and HYDROcodone-acetaminophen. I am also having him start on cetirizine. Additionally, I am having him maintain his multivitamin with minerals, topiramate, traZODone, diltiazem, omeprazole, dicyclomine, pravastatin, and metoprolol succinate. We administered methylPREDNISolone acetate.  Meds ordered this encounter  Medications   . cetirizine (ZYRTEC) 10 MG tablet    Sig: Take 1 tablet (10 mg total) by mouth daily.    Dispense:  30 tablet    Refill:  11  . methylPREDNISolone acetate (DEPO-MEDROL) injection 120 mg    Sig:      Follow-up: Return in about 4 months (around 12/07/2015).  Scarlette Calico, MD

## 2015-08-07 NOTE — Patient Instructions (Signed)
Allergic Rhinitis Allergic rhinitis is when the mucous membranes in the nose respond to allergens. Allergens are particles in the air that cause your body to have an allergic reaction. This causes you to release allergic antibodies. Through a chain of events, these eventually cause you to release histamine into the blood stream. Although meant to protect the body, it is this release of histamine that causes your discomfort, such as frequent sneezing, congestion, and an itchy, runny nose.  CAUSES Seasonal allergic rhinitis (hay fever) is caused by pollen allergens that may come from grasses, trees, and weeds. Year-round allergic rhinitis (perennial allergic rhinitis) is caused by allergens such as house dust mites, pet dander, and mold spores. SYMPTOMS  Nasal stuffiness (congestion).  Itchy, runny nose with sneezing and tearing of the eyes. DIAGNOSIS Your health care provider can help you determine the allergen or allergens that trigger your symptoms. If you and your health care provider are unable to determine the allergen, skin or blood testing may be used. Your health care provider will diagnose your condition after taking your health history and performing a physical exam. Your health care provider may assess you for other related conditions, such as asthma, pink eye, or an ear infection. TREATMENT Allergic rhinitis does not have a cure, but it can be controlled by:  Medicines that block allergy symptoms. These may include allergy shots, nasal sprays, and oral antihistamines.  Avoiding the allergen. Hay fever may often be treated with antihistamines in pill or nasal spray forms. Antihistamines block the effects of histamine. There are over-the-counter medicines that may help with nasal congestion and swelling around the eyes. Check with your health care provider before taking or giving this medicine. If avoiding the allergen or the medicine prescribed do not work, there are many new medicines  your health care provider can prescribe. Stronger medicine may be used if initial measures are ineffective. Desensitizing injections can be used if medicine and avoidance does not work. Desensitization is when a patient is given ongoing shots until the body becomes less sensitive to the allergen. Make sure you follow up with your health care provider if problems continue. HOME CARE INSTRUCTIONS It is not possible to completely avoid allergens, but you can reduce your symptoms by taking steps to limit your exposure to them. It helps to know exactly what you are allergic to so that you can avoid your specific triggers. SEEK MEDICAL CARE IF:  You have a fever.  You develop a cough that does not stop easily (persistent).  You have shortness of breath.  You start wheezing.  Symptoms interfere with normal daily activities.   This information is not intended to replace advice given to you by your health care provider. Make sure you discuss any questions you have with your health care provider.   Document Released: 02/17/2001 Document Revised: 06/15/2014 Document Reviewed: 01/30/2013 Elsevier Interactive Patient Education 2016 [employer] Inc[employer]  

## 2015-08-07 NOTE — Progress Notes (Signed)
Pre visit review using our clinic review tool, if applicable. No additional management support is needed unless otherwise documented below in the visit note. 

## 2015-08-19 ENCOUNTER — Other Ambulatory Visit: Payer: Medicare HMO

## 2015-08-19 ENCOUNTER — Telehealth: Payer: Self-pay | Admitting: Cardiology

## 2015-08-19 NOTE — Telephone Encounter (Signed)
New message: -Pt wants to know if he needs to come for lab work today? He said he had lab work about 2 weeks ago at his primary doctor(Dr Scarlette Calico at [place]).Please call asap,his lab is scheduled for today.

## 2015-08-19 NOTE — Telephone Encounter (Signed)
Looks good.     ----- Message -----     From: Emmaline Life, RN     Sent: 08/19/2015 11:57 AM      To: Larey Dresser, MD        For your review. Completed with PCP

## 2015-08-19 NOTE — Telephone Encounter (Signed)
Spoke with patient and advised that at last office visit on 1/13 Dr. Aundra Dubin ordered fasting lipid/liver for today, however I do see where patient had this completed at Dr. Ronnald Ramp' office (PCP) on 3/1.  I advised I will forward results to Dr. Aundra Dubin for his review and cancel lab appointment for today.  He verbalized understanding and agreement and thanked me for the call.

## 2015-09-23 ENCOUNTER — Telehealth: Payer: Self-pay

## 2015-09-23 NOTE — Telephone Encounter (Signed)
PA initiated via CoverMyMeds Key [id number]

## 2015-09-24 NOTE — Telephone Encounter (Signed)
Paperwork completed and faxed back to insurance company for review

## 2015-09-24 NOTE — Telephone Encounter (Signed)
Additional clinical information received via fax. Placed on MD's desk for signature

## 2015-09-25 NOTE — Telephone Encounter (Signed)
APPROVED through 06/07/2016 

## 2015-09-30 ENCOUNTER — Ambulatory Visit (INDEPENDENT_AMBULATORY_CARE_PROVIDER_SITE_OTHER): Payer: Medicare HMO | Admitting: Internal Medicine

## 2015-09-30 ENCOUNTER — Other Ambulatory Visit (INDEPENDENT_AMBULATORY_CARE_PROVIDER_SITE_OTHER): Payer: Medicare HMO

## 2015-09-30 ENCOUNTER — Encounter: Payer: Self-pay | Admitting: Internal Medicine

## 2015-09-30 VITALS — BP 140/84 | HR 89 | Temp 98.4°F | Resp 20 | Wt 138.0 lb

## 2015-09-30 DIAGNOSIS — R002 Palpitations: Secondary | ICD-10-CM

## 2015-09-30 DIAGNOSIS — R200 Anesthesia of skin: Secondary | ICD-10-CM | POA: Insufficient documentation

## 2015-09-30 DIAGNOSIS — I10 Essential (primary) hypertension: Secondary | ICD-10-CM

## 2015-09-30 DIAGNOSIS — R270 Ataxia, unspecified: Secondary | ICD-10-CM | POA: Insufficient documentation

## 2015-09-30 DIAGNOSIS — R208 Other disturbances of skin sensation: Secondary | ICD-10-CM | POA: Diagnosis not present

## 2015-09-30 DIAGNOSIS — J441 Chronic obstructive pulmonary disease with (acute) exacerbation: Secondary | ICD-10-CM

## 2015-09-30 DIAGNOSIS — K219 Gastro-esophageal reflux disease without esophagitis: Secondary | ICD-10-CM

## 2015-09-30 LAB — BASIC METABOLIC PANEL
BUN: 19 mg/dL (ref 6–23)
CALCIUM: 9.8 mg/dL (ref 8.4–10.5)
CHLORIDE: 104 meq/L (ref 96–112)
CO2: 29 meq/L (ref 19–32)
CREATININE: 0.7 mg/dL (ref 0.40–1.50)
GFR: 116.56 mL/min (ref >60.00)
Glucose, Bld: 93 mg/dL (ref 70–99)
Potassium: 4.3 mEq/L (ref 3.5–5.1)
SODIUM: 139 meq/L (ref 135–145)

## 2015-09-30 LAB — CBC WITH DIFFERENTIAL/PLATELET
BASOS ABS: 0 10*3/uL (ref 0.0–0.1)
Basophils Relative: 0.4 % (ref 0.0–3.0)
Eosinophils Absolute: 0 10*3/uL (ref 0.0–0.7)
Eosinophils Relative: 0.4 % (ref 0.0–5.0)
HCT: 43.6 % (ref 39.0–52.0)
Hemoglobin: 14.8 g/dL (ref 13.0–17.0)
LYMPHS ABS: 1.3 10*3/uL (ref 0.7–4.0)
Lymphocytes Relative: 13.1 % (ref 12.0–46.0)
MCHC: 34 g/dL (ref 30.0–36.0)
MCV: 91.8 fl (ref 78.0–100.0)
MONO ABS: 0.6 10*3/uL (ref 0.1–1.0)
Monocytes Relative: 6.2 % (ref 3.0–12.0)
NEUTROS ABS: 7.8 10*3/uL — AB (ref 1.4–7.7)
NEUTROS PCT: 79.9 % — AB (ref 43.0–77.0)
PLATELETS: 281 10*3/uL (ref 150.0–400.0)
RBC: 4.75 Mil/uL (ref 4.22–5.81)
RDW: 15.8 % — ABNORMAL HIGH (ref 11.5–15.5)
WBC: 9.8 10*3/uL (ref 4.0–10.5)

## 2015-09-30 LAB — CORTISOL: CORTISOL PLASMA: 4.7 ug/dL

## 2015-09-30 LAB — FOLATE: Folate: >23.7 ng/mL (ref >5.9)

## 2015-09-30 LAB — VITAMIN B12: VITAMIN B 12: 649 pg/mL (ref 211–911)

## 2015-09-30 MED ORDER — GLYCOPYRROLATE-FORMOTEROL 9-4.8 MCG/ACT IN AERO: 2.0000 | INHALATION_SPRAY | Freq: Two times a day (BID) | RESPIRATORY_TRACT | Status: DC

## 2015-09-30 NOTE — Progress Notes (Signed)
Subjective:  Patient ID: [name], male    DOB: [birth year]  Age: 75 y.o. MRN: [id number]  CC: Cough   HPI TAMMER NETTLES presents for a one-month history of feeling poorly, he describes intermittent episodes of dizziness and orthostasis. He has had a few palpitations and complains of insomnia, diffuse headache, ataxia and feeling weak all over. He has not passed out nor is he felt presyncopal. In addition to that he has had a nonproductive cough for about 4 days. He has had no chest pain, dyspnea on exertion, edema, paresthesias, or hemoptysis.  Outpatient Prescriptions Prior to Visit  Medication Sig Dispense Refill  . cetirizine (ZYRTEC) 10 MG tablet Take 1 tablet (10 mg total) by mouth daily. 30 tablet 11  . dicyclomine (BENTYL) 10 MG capsule Take one capsule by mouth before meals three times a day 270 capsule 1  . diltiazem (TIAZAC) 360 MG 24 hr capsule Take 1 capsule (360 mg total) by mouth daily. 90 capsule 3  . metoprolol succinate (TOPROL-XL) 25 MG 24 hr tablet Take 1 tablet (25 mg total) by mouth 2 (two) times daily. 180 tablet 3  . Multiple Vitamin (MULTIVITAMIN WITH MINERALS) TABS tablet Take 1 tablet by mouth daily.    Marland Kitchen omeprazole (PRILOSEC) 20 MG capsule Take 1 capsule (20 mg total) by mouth 2 (two) times daily. 90 capsule 1  . pravastatin (PRAVACHOL) 40 MG tablet Take 1 tablet (40 mg total) by mouth every evening. 90 tablet 3  . topiramate (TOPAMAX) 25 MG tablet Take 25 mg by mouth daily as needed (for headaches).     . traZODone (DESYREL) 50 MG tablet TAKE 1 TABLET BY MOUTH EVERY NIGHT AT BEDTIME 90 tablet 2   No facility-administered medications prior to visit.    ROS Review of Systems  Constitutional: Positive for fatigue. Negative for fever, chills, diaphoresis, activity change, appetite change and unexpected weight change.  Eyes: Negative.  Negative for visual disturbance.  Respiratory: Positive for cough, shortness of breath and wheezing. Negative for choking,  chest tightness and stridor.   Cardiovascular: Negative.  Negative for chest pain, palpitations and leg swelling.  Gastrointestinal: Negative.  Negative for nausea, vomiting, abdominal pain, diarrhea and constipation.  Endocrine: Negative.   Genitourinary: Negative.  Negative for difficulty urinating.  Musculoskeletal: Positive for gait problem. Negative for myalgias, back pain, joint swelling and arthralgias.  Skin: Negative.  Negative for pallor and rash.  Allergic/Immunologic: Negative.   Neurological: Positive for dizziness, weakness, numbness and headaches. Negative for seizures, syncope, facial asymmetry and light-headedness.       He complains of chronic numbness and tingling in both feet  Hematological: Negative.  Negative for adenopathy. Does not bruise/bleed easily.  Psychiatric/Behavioral: Negative.     Objective:  BP 140/84 mmHg  Pulse 89  Temp(Src) 98.4 F (36.9 C) (Oral)  Resp 20  Wt 138 lb (62.596 kg)  SpO2 95%  BP Readings from Last 3 Encounters:  09/30/15 140/84  08/07/15 148/78  06/21/15 164/80    Wt Readings from Last 3 Encounters:  09/30/15 138 lb (62.596 kg)  08/07/15 138 lb (62.596 kg)  06/21/15 138 lb (62.596 kg)    Physical Exam  Constitutional: He is oriented to person, place, and time.  Non-toxic appearance. He does not have a sickly appearance. He does not appear ill. No distress.  HENT:  Mouth/Throat: Oropharynx is clear and moist. No oropharyngeal exudate.  Eyes: Conjunctivae are normal. Right eye exhibits no discharge. Left eye exhibits no discharge.  No scleral icterus.  Neck: Normal range of motion. Neck supple. No JVD present. No tracheal deviation present. No thyromegaly present.  Cardiovascular: Normal rate, regular rhythm, normal heart sounds and intact distal pulses.  Exam reveals no gallop and no friction rub.   No murmur heard. EKG ----  Sinus  Rhythm  WITHIN NORMAL LIMITS  Pulmonary/Chest: Effort normal. No stridor. No respiratory  distress. He has no decreased breath sounds. He has wheezes in the right middle field and the left middle field. He has rhonchi in the right middle field and the left middle field. He has no rales. He exhibits no tenderness.  Abdominal: Soft. Bowel sounds are normal. He exhibits no distension and no mass. There is no tenderness. There is no rebound and no guarding.  Musculoskeletal: Normal range of motion. He exhibits no edema.  Lymphadenopathy:    He has no cervical adenopathy.  Neurological: He is alert and oriented to person, place, and time. He has normal reflexes. He displays normal reflexes. No cranial nerve deficit. He exhibits normal muscle tone. Coordination normal.  Skin: Skin is warm and dry. No rash noted. He is not diaphoretic. No erythema. No pallor.  Vitals reviewed.   Lab Results  Component Value Date   WBC 9.8 09/30/2015   HGB 14.8 09/30/2015   HCT 43.6 09/30/2015   PLT 281.0 09/30/2015   GLUCOSE 93 09/30/2015   CHOL 142 08/07/2015   TRIG 133.0 08/07/2015   HDL 45.70 08/07/2015   LDLDIRECT 134.5 08/22/2007   LDLCALC 70 08/07/2015   ALT 21 06/16/2014   AST 23 06/16/2014   NA 139 09/30/2015   K 4.3 09/30/2015   CL 104 09/30/2015   CREATININE 0.70 09/30/2015   BUN 19 09/30/2015   CO2 29 09/30/2015   TSH 1.26 08/07/2015   PSA 6.69* 08/22/2007   INR 0.97 01/16/2014    No results found.  Assessment & Plan:   [place] was seen today for cough.  Diagnoses and all orders for this visit:  Palpitations- his EKG is normal today, I have ordered a cardiac event monitor to see if his palpitations indicate any significant dysrhythmia. -     EKG 12-Lead -     Cardiac event monitor; Future -     Cortisol; Future  Ataxia- B12 and folate are normal, will get an MRI of his brain done to see if there has been a CVA, demyelinating disease, mass, atrophy or bleed. -     MR Brain Wo Contrast; Future -     Vitamin B12; Future -     Folate; Future  Essential hypertension- his  blood pressures well controlled, electrolytes and renal function are stable. -     Basic metabolic panel; Future -     Cortisol; Future  Gastroesophageal reflux disease without esophagitis -     CBC with Differential/Platelet; Future  Numbness of both lower extremities- B12 and folate are normal, will get an MRI of his brain to see if this explains the numbness and if he does not improve will consider referral to neurology. -     Vitamin B12; Future -     Folate; Future  Obstructive chronic bronchitis with exacerbation ([place])- his symptoms may be related to CB/COPD  so I have asked him to start using an anti-cholinergic/LABA combination. I gave him samples of Bevespi and showed him how to use it, he demonstrated proficiency with its use. -     Glycopyrrolate-Formoterol (BEVESPI AEROSPHERE) 9-4.8 MCG/ACT AERO; Inhale 2 puffs  into the lungs 2 (two) times daily.   I am having Mr. Tebbetts start on Glycopyrrolate-Formoterol. I am also having him maintain his multivitamin with minerals, topiramate, traZODone, diltiazem, omeprazole, dicyclomine, pravastatin, metoprolol succinate, and cetirizine.  Meds ordered this encounter  Medications  . Glycopyrrolate-Formoterol (BEVESPI AEROSPHERE) 9-4.8 MCG/ACT AERO    Sig: Inhale 2 puffs into the lungs 2 (two) times daily.    Dispense:  10.7 g    Refill:  11     Follow-up: Return in about 3 weeks (around 10/21/2015).  Scarlette Calico, MD

## 2015-09-30 NOTE — Patient Instructions (Signed)

## 2015-09-30 NOTE — Progress Notes (Signed)
Pre visit review using our clinic review tool, if applicable. No additional management support is needed unless otherwise documented below in the visit note. 

## 2015-10-07 ENCOUNTER — Ambulatory Visit
Admission: RE | Admit: 2015-10-07 | Discharge: 2015-10-07 | Disposition: A | Discharge status: Home / Self Care | Payer: Medicare HMO | Source: Ambulatory Visit | Attending: Internal Medicine | Admitting: Internal Medicine

## 2015-10-07 DIAGNOSIS — R270 Ataxia, unspecified: Secondary | ICD-10-CM

## 2015-10-08 ENCOUNTER — Telehealth: Payer: Self-pay | Admitting: *Deleted

## 2015-10-08 ENCOUNTER — Telehealth: Payer: Self-pay

## 2015-10-08 DIAGNOSIS — J441 Chronic obstructive pulmonary disease with (acute) exacerbation: Secondary | ICD-10-CM

## 2015-10-08 DIAGNOSIS — R270 Ataxia, unspecified: Secondary | ICD-10-CM

## 2015-10-08 DIAGNOSIS — G9389 Other specified disorders of brain: Secondary | ICD-10-CM

## 2015-10-08 MED ORDER — UMECLIDINIUM-VILANTEROL 62.5-25 MCG/INH IN AEPB: 1.0000 | INHALATION_SPRAY | Freq: Every day | RESPIRATORY_TRACT | Status: DC

## 2015-10-08 NOTE — Telephone Encounter (Signed)
changed

## 2015-10-08 NOTE — Telephone Encounter (Signed)
Received fax stating pt plan does not cover the Bevespi 9-4.8 mcg inhaler, Per pt benefit plan alternative would be Anoro or Stioloto. Pls advise of med change...[name]

## 2015-10-08 NOTE — Telephone Encounter (Signed)
Repeat MRI with contrast ordered

## 2015-10-08 NOTE — Telephone Encounter (Signed)
Dr. [place] Crane findings: Sq stroke or tumor in L part of brain cannot tell at this time. R post contrast of images in the brain, suggesting  MRI of head with contrast to see which process it is.  MRI did not explain the numbess per Dr. Nevada Crane     Any additional questions please contact he will be out of his office as of tonight    Dr. Talmadge Chad # [phone]

## 2015-10-09 NOTE — Telephone Encounter (Signed)
Pt request to speak to the assistant concern about this. Please give him a call

## 2015-10-09 NOTE — Telephone Encounter (Signed)
lmovm to call the office back

## 2015-10-10 ENCOUNTER — Other Ambulatory Visit: Payer: Self-pay | Admitting: Internal Medicine

## 2015-10-10 DIAGNOSIS — R270 Ataxia, unspecified: Secondary | ICD-10-CM

## 2015-10-10 DIAGNOSIS — G9389 Other specified disorders of brain: Secondary | ICD-10-CM

## 2015-10-10 NOTE — Progress Notes (Signed)
Had to reorder MRI W CONTRAST ONLY PER GI

## 2015-10-10 NOTE — Telephone Encounter (Signed)
Pt informed repeat MRI was needed

## 2015-10-10 NOTE — Telephone Encounter (Signed)
Patient called back to speak with Gabby. Please give him a call back when you can. Thank you

## 2015-10-17 ENCOUNTER — Encounter: Payer: Self-pay | Admitting: *Deleted

## 2015-10-17 NOTE — Progress Notes (Signed)
Patient ID: [name], male   DOB: 01-24-1941, 75 y.o.   MRN: [id number] Patient did not show up for 10/17/15, 10:30 AM, appointment to have a cardiac event monitor applied.

## 2015-10-18 ENCOUNTER — Ambulatory Visit
Admission: RE | Admit: 2015-10-18 | Discharge: 2015-10-18 | Disposition: A | Discharge status: Home / Self Care | Payer: Medicare HMO | Source: Ambulatory Visit | Attending: Internal Medicine | Admitting: Internal Medicine

## 2015-10-18 DIAGNOSIS — R270 Ataxia, unspecified: Secondary | ICD-10-CM

## 2015-10-18 DIAGNOSIS — G9389 Other specified disorders of brain: Secondary | ICD-10-CM

## 2015-10-18 MED ORDER — GADOBENATE DIMEGLUMINE 529 MG/ML IV SOLN
12.0000 mL | Freq: Once | INTRAVENOUS | Status: AC | PRN
  Administered 2015-10-18: 12 mL via INTRAVENOUS

## 2015-10-20 ENCOUNTER — Other Ambulatory Visit: Payer: Self-pay | Admitting: Internal Medicine

## 2015-10-20 DIAGNOSIS — I63312 Cerebral infarction due to thrombosis of left middle cerebral artery: Secondary | ICD-10-CM | POA: Insufficient documentation

## 2015-10-28 ENCOUNTER — Ambulatory Visit (INDEPENDENT_AMBULATORY_CARE_PROVIDER_SITE_OTHER): Payer: Medicare HMO

## 2015-10-28 DIAGNOSIS — R002 Palpitations: Secondary | ICD-10-CM | POA: Diagnosis not present

## 2015-11-06 ENCOUNTER — Other Ambulatory Visit: Payer: Self-pay | Admitting: Internal Medicine

## 2015-11-06 ENCOUNTER — Telehealth: Payer: Self-pay

## 2015-11-06 NOTE — Telephone Encounter (Signed)
Pt stated monitior was breaking him out and was going to take off Sunday and mail out. Pt states has bene wearing over 9 days. Routing to PCP as [employer]

## 2015-11-06 NOTE — Telephone Encounter (Signed)
ok 

## 2015-11-06 NOTE — Telephone Encounter (Signed)
Can you please call patient back. He has some question about his heart monitor. Thank you.

## 2015-12-10 ENCOUNTER — Encounter: Payer: Self-pay | Admitting: Internal Medicine

## 2015-12-11 ENCOUNTER — Encounter: Payer: Self-pay | Admitting: Neurology

## 2015-12-11 ENCOUNTER — Ambulatory Visit: Payer: Medicare HMO | Admitting: Internal Medicine

## 2015-12-11 ENCOUNTER — Ambulatory Visit (INDEPENDENT_AMBULATORY_CARE_PROVIDER_SITE_OTHER): Payer: Medicare HMO | Admitting: Neurology

## 2015-12-11 VITALS — BP 136/78 | HR 82 | Ht 66.0 in | Wt 134.0 lb

## 2015-12-11 DIAGNOSIS — R208 Other disturbances of skin sensation: Secondary | ICD-10-CM

## 2015-12-11 DIAGNOSIS — G4441 Drug-induced headache, not elsewhere classified, intractable: Secondary | ICD-10-CM | POA: Diagnosis not present

## 2015-12-11 DIAGNOSIS — F17200 Nicotine dependence, unspecified, uncomplicated: Secondary | ICD-10-CM | POA: Diagnosis not present

## 2015-12-11 DIAGNOSIS — G4440 Drug-induced headache, not elsewhere classified, not intractable: Secondary | ICD-10-CM

## 2015-12-11 DIAGNOSIS — R002 Palpitations: Secondary | ICD-10-CM

## 2015-12-11 DIAGNOSIS — R200 Anesthesia of skin: Secondary | ICD-10-CM

## 2015-12-11 DIAGNOSIS — E785 Hyperlipidemia, unspecified: Secondary | ICD-10-CM

## 2015-12-11 DIAGNOSIS — I63312 Cerebral infarction due to thrombosis of left middle cerebral artery: Secondary | ICD-10-CM | POA: Diagnosis not present

## 2015-12-11 DIAGNOSIS — I10 Essential (primary) hypertension: Secondary | ICD-10-CM

## 2015-12-11 MED ORDER — GABAPENTIN 100 MG PO CAPS: ORAL_CAPSULE | ORAL | Status: DC

## 2015-12-11 NOTE — Progress Notes (Signed)
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  Needs monitoring for atrial fibrillation.  Now that he has had CVA, would suggest LINQ monitor for long-term monitoring rather than 30 day monitor (more effective).  If he agrees, needs referral to EP for this.

## 2015-12-11 NOTE — Patient Instructions (Signed)
1.  For headaches, we will start gabapentin.  Take 1 capsule at bedtime for 7 days, then 2 capsules at bedtime.  Contact me in 2 weeks and we can increase dose if needed. 2.  Start aspirin 81mg  daily. 3.  Will check MRA of head and neck 4.  Will check 2D echocardiogram 5.  Contact Dr. Aundra Dubin.  I think you do need a 30 day heart monitor. 6.  Will get nerve study of the feet. 7.  Limit use of Excedrin to no more than 2 days out of the week.

## 2015-12-11 NOTE — Progress Notes (Addendum)
NEUROLOGY CONSULTATION NOTE  [name] MRN: [id number] DOB: 12/02/1940  Referring provider: Dr. Ronnald Ramp Primary care provider: Dr. Ronnald Ramp  Reason for consult:  Stroke  HISTORY OF PRESENT ILLNESS: [name] is a 75 year old right-handed man with hypertension, hyperlipidemia, nonobstructive CAD, palpitations and GERD and history of prostate cancer who presents for stroke.   He has longstanding history of headaches, which are over his crown, and non-throbbing.  It is constant.  There is no associated nausea, visual disturbance, photophobia or phonophobia.  He takes Excedrin daily for many years.  He also takes hydrocodone for shoulder pain.  He also has occasional episodes of lightheadedness and feeling unsteady.    To further assess this, MRI was performed.   MRI of brain without contrast from 10/08/15 and with and without contrast on 10/18/15 were personally reviewed and revealed a subacute left MCA territory infarction.  He has history of two back surgeries.  He reports numbness on the bottom of both feet as well as weakness in the legs.  He has not had falls.  He has some pain in the calves but no back pain.  Vascular studies reportedly negative.  MRI of lumbar spine from 2013 showed postsurgical changes as well as disc protrusion impinging the left L4 nerve root.  B12 was 649, folate over 23.7 and TSH 1.26.    Lipid panel from 08/07/15 showed LDL of 70. Serum glucose has been in the 90s.  He had a 30-day event monitor to assess palpitations.  He only wore it for a week due to discomfort.  It revealed PVCs/PACs but did not reveal atrial fibrillation.  PAST MEDICAL HISTORY: Past Medical History  Diagnosis Date  . Hyperlipidemia   . Hypertension   . GERD (gastroesophageal reflux disease)   . ED (erectile dysfunction)   . Peyronie's disease   . Depression   . Hemorrhoids 2002  . History of kidney stones 2014    right ureteral lithotripsy 01/2013  . IBS (irritable bowel syndrome)   .  Ischemic colitis ([place])   . History of esophageal dilatation     FOR STRICTURE 2008  . History of peptic ulcer   . History of duodenal ulcer   . Diverticulosis, sigmoid   . Coronary artery disease CARDIOLOGIST-- DR Rayann Heman    NON-OBSTRUCTIVE CAD--- 2005 Cath with 30% LAD, calcification/plaque of LAD, EF 60%  . History of prostate cancer     S/P RADICAL PROSTATECTOMY 2009  . Right ureteral stone   . SUI (stress urinary incontinence), male   . PAC (premature atrial contraction)   . Adenoma of left adrenal gland     stable per ct  . History of gallstones   . Frequency of urination   . Nocturia   . OA (osteoarthritis)     RIGHT SHOULDER/ HIPS  . DDD (degenerative disc disease), lumbosacral   . 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 Wears glasses   . Eczema   . Encopresis     PAST SURGICAL HISTORY: Past Surgical History  Procedure Laterality Date  . Nasal septum surgery  YRS AGO  . Flexible sigmoidoscopy N/A 03/19/2014    Procedure: FLEXIBLE SIGMOIDOSCOPY;  Surgeon: Inda Castle, MD;  Location: [facility];  Service: Endoscopy;  Laterality: N/A;  . Radical prostatectomy/ bilateral retroperitoneal lymph node dissection  12-29-2007  . Tonsillectomy and adenoidectomy  as child  . Lumbar fusion  03-26-2005    Revision laminectomy L5 - S1,  Laminectomy and  Decompression L4-L5, fusion L4 -- S1  . Removal benign right eyelid lesion  12-19-2007  . Cardiovascular stress test  12-23-2012  DR ALLRED    Low risk lexiscan study with small mild intensity, fixed inferior defect consistent with thinning, no ischemia/  normal LVF and wall motion, ef 66%  . Transthoracic echocardiogram  07-13-2014    mild LVH/  ef 60-655,  grade I diastolic dysfunction/  trivial AR, MR and TR/  mild LAE  . Cardiac catheterization  07-23-2003    Non-obstructive CAD/  30% narrowing ostium LM,  30-40% eccentric calcified plaque LAD, 20% narrowing RCA/  preserved LVF  . Inguinal hernia repair  Bilateral 03-17-2010  . Sling cystourethropexy (advance)  10-12-2009  . Lumbar laminectomy  1999    L5  -- S1  . Extracorporeal shock wave lithotripsy Right 01-30-2013  . Esophagogastroduodenoscopy (egd) with esophageal dilation  02-04-2001  . Colonoscopy  last one 06-19-2011  . Cystoscopy/retrograde/ureteroscopy/stone extraction with basket Right 07/19/2014    Procedure: RIGHT URETEROSCOPY/STONE EXTRACTION WITH BASKET;  Surgeon: Malka So, MD;  Location: [facility];  Service: Urology;  Laterality: Right;  . Cystoscopy w/ retrogrades Right 07/19/2014    Procedure: CYSTOSCOPY WITH RETROGRADE PYELOGRAM;  Surgeon: Malka So, MD;  Location: [facility];  Service: Urology;  Laterality: Right;    MEDICATIONS: Current Outpatient Prescriptions on File Prior to Visit  Medication Sig Dispense Refill  . dicyclomine (BENTYL) 10 MG capsule Take one capsule by mouth before meals three times a day 270 capsule 1  . diltiazem (TIAZAC) 360 MG 24 hr capsule Take 1 capsule (360 mg total) by mouth daily. 90 capsule 3  . metoprolol succinate (TOPROL-XL) 25 MG 24 hr tablet Take 1 tablet (25 mg total) by mouth 2 (two) times daily. 180 tablet 3  . Multiple Vitamin (MULTIVITAMIN WITH MINERALS) TABS tablet Take 1 tablet by mouth daily.    Marland Kitchen omeprazole (PRILOSEC) 20 MG capsule TAKE ONE CAPSULE BY MOUTH TWICE DAILY 180 capsule 1  . pravastatin (PRAVACHOL) 40 MG tablet Take 1 tablet (40 mg total) by mouth every evening. 90 tablet 3  . umeclidinium-vilanterol (ANORO ELLIPTA) 62.5-25 MCG/INH AEPB Inhale 1 puff into the lungs daily. 30 each 11  . cetirizine (ZYRTEC) 10 MG tablet Take 1 tablet (10 mg total) by mouth daily. 30 tablet 11  . topiramate (TOPAMAX) 25 MG tablet Take 25 mg by mouth daily as needed (for headaches). Reported on 12/11/2015    . traZODone (DESYREL) 50 MG tablet TAKE 1 TABLET BY MOUTH EVERY NIGHT AT BEDTIME (Patient not taking: Reported on 12/11/2015) 90 tablet 2   No  current facility-administered medications on file prior to visit.    ALLERGIES: Allergies  Allergen Reactions  . Pseudoephedrine Palpitations    "Heart races"  . Tape Other (See Comments)    tears skin, can use paper tape  . Sulfa Antibiotics Itching  . Oxycodone Other (See Comments)    "over sedates"  . Lodine [Etodolac] Diarrhea    FAMILY HISTORY: Family History  Problem Relation Age of Onset  . Colon cancer Maternal Aunt 67  . Heart attack Father 57  . Aneurysm Father   . Lymphoma Maternal Aunt   . Bone cancer Maternal Grandfather     SOCIAL HISTORY: Social History   Social History  . Marital Status: Married    Spouse Name: N/A  . Number of Children: 2  . Years of Education: N/A   Occupational History  . Retired  Social History Main Topics  . Smoking status: Current Every Day Smoker -- 1.00 packs/day for 53 years    Types: Cigarettes  . Smokeless tobacco: Never Used  . Alcohol Use: No  . Drug Use: No  . Sexual Activity: Not on file   Other Topics Concern  . Not on file   Social History Narrative    REVIEW OF SYSTEMS: Constitutional: No fevers, chills, or sweats, no generalized fatigue, change in appetite Eyes: No visual changes, double vision, eye pain Ear, nose and throat: No hearing loss, ear pain, nasal congestion, sore throat Cardiovascular: No chest pain, palpitations Respiratory:  No shortness of breath at rest or with exertion, wheezes GastrointestinaI: No nausea, vomiting, diarrhea, abdominal pain, fecal incontinence Genitourinary:  No dysuria, urinary retention or frequency Musculoskeletal:  No neck pain, back pain Integumentary: No rash, pruritus, skin lesions Neurological: as above Psychiatric: No depression, insomnia, anxiety Endocrine: No palpitations, fatigue, diaphoresis, mood swings, change in appetite, change in weight, increased thirst Hematologic/Lymphatic:  No purpura, petechiae. Allergic/Immunologic: no itchy/runny eyes,  nasal congestion, recent allergic reactions, rashes  PHYSICAL EXAM: Filed Vitals:   12/11/15 1440  BP: 136/78  Pulse: 82   General: No acute distress.  Patient appears well-groomed.  Head:  Normocephalic/atraumatic Eyes:  fundi examined but not visualized Neck: supple, no paraspinal tenderness, full range of motion Back: No paraspinal tenderness Heart: regular rate and rhythm Lungs: Clear to auscultation bilaterally. Vascular: No carotid bruits. Neurological Exam: Mental status: alert and oriented to person, place, and time, recent and remote memory intact, fund of knowledge intact, attention and concentration intact, speech fluent and not dysarthric, language intact. Cranial nerves: CN I: not tested CN II: pupils equal, round and reactive to light, visual fields intact CN III, IV, VI:  full range of motion, no nystagmus, no ptosis CN V: facial sensation intact CN VII: upper and lower face symmetric CN VIII: hearing intact CN IX, X: gag intact, uvula midline CN XI: sternocleidomastoid and trapezius muscles intact CN XII: tongue midline Bulk & Tone: normal, no fasciculations. Motor:  5/5 throughout  Sensation:  Pinprick sensation reduced on sole of feet.  Decreased vibration in toes. Deep Tendon Reflexes:  2+ throughout, toes downgoing. Finger to nose testing:  Without dysmetria.  Heel to shin:  Without dysmetria.  Gait:  Normal station and stride.  Able to turn but struggles to tandem walk. Romberg negative.  IMPRESSION: 1.  Left MCA stroke, subacute, likely incidental finding 2.  Chronic headache, likely medication-overuse 3.  Numbness in feet.  Consider polyneuropathy or bilateral radiculopathy 4.  HTN 5.  Hyperlipidemia 6.  Palpitations 7.  Tobacco use  PLAN: 1.  Will start gabapentin for headache, titrating to 200mg  at bedtime.  He will contact me in a couple of weeks with update and we can titrate further if needed. 2.  Must limit Excedrin to no more than 2 days  out of the week.  Use of pain medication for shoulders may complicate treatment of headache. 3.  NCV-EMG of lower extremities 4.  For stroke workup, will check MRA of head and neck, as well as 2D echo.  Given history of subjective palpitations, recommend completing 30 day cardiac monitoring to evaluate for atrial fibrillation.  I advised that they contact Dr. Aundra Dubin, his cardiologist. 5.  Blood pressure control 6.  Statin therapy as managed by PCP.  LDL goal less than 70. 7.  Smoking cessation 9.  Follow up in 3 months. 10.  I also discussed with him  that he should start ASA 81mg  daily for secondary stroke prevention.  Thank you for allowing me to take part in the care of this patient.  Metta Clines, DO  CC:  Scarlette Calico, MD  Loralie Champagne, MD

## 2015-12-12 ENCOUNTER — Telehealth: Payer: Self-pay

## 2015-12-12 ENCOUNTER — Telehealth: Payer: Self-pay | Admitting: *Deleted

## 2015-12-12 DIAGNOSIS — I63312 Cerebral infarction due to thrombosis of left middle cerebral artery: Secondary | ICD-10-CM

## 2015-12-12 DIAGNOSIS — I10 Essential (primary) hypertension: Secondary | ICD-10-CM

## 2015-12-12 NOTE — Telephone Encounter (Signed)
MRA reordered due to [facility] ([address] [place], [place] [address] ph: [phone] fax: [phone]) not being in network. Switched to [facility].

## 2015-12-12 NOTE — Telephone Encounter (Signed)
P.A. Received. Pt scheduled at [facility] for 12/20/15 @  3:45 p.m. Pt was given scheduling's contact number if appointment needed rescheduled.

## 2015-12-12 NOTE — Telephone Encounter (Signed)
Larey Dresser, MD at 12/11/2015 9:54 PM     Status: Signed        Patient has left MCA territory infarct seen on head MRI. Needs monitoring for atrial fibrillation. Now that he has had CVA, would suggest LINQ monitor for long-term monitoring rather than 30 day monitor (more effective). If he agrees, needs referral to EP for this.

## 2015-12-12 NOTE — Telephone Encounter (Signed)
Pt agreed to referral to EP for [facility] monitor, scheduled with Dr Caryl Comes 12/26/15 at [facility].

## 2015-12-19 ENCOUNTER — Ambulatory Visit (INDEPENDENT_AMBULATORY_CARE_PROVIDER_SITE_OTHER): Payer: Medicare HMO | Admitting: Internal Medicine

## 2015-12-19 ENCOUNTER — Encounter: Payer: Self-pay | Admitting: Internal Medicine

## 2015-12-19 VITALS — BP 140/70 | HR 81 | Temp 98.6°F | Resp 20 | Ht 66.0 in | Wt 135.0 lb

## 2015-12-19 DIAGNOSIS — I63312 Cerebral infarction due to thrombosis of left middle cerebral artery: Secondary | ICD-10-CM | POA: Diagnosis not present

## 2015-12-19 DIAGNOSIS — J441 Chronic obstructive pulmonary disease with (acute) exacerbation: Secondary | ICD-10-CM

## 2015-12-19 DIAGNOSIS — I10 Essential (primary) hypertension: Secondary | ICD-10-CM

## 2015-12-19 DIAGNOSIS — Z23 Encounter for immunization: Secondary | ICD-10-CM

## 2015-12-19 DIAGNOSIS — R002 Palpitations: Secondary | ICD-10-CM

## 2015-12-19 DIAGNOSIS — G4700 Insomnia, unspecified: Secondary | ICD-10-CM | POA: Diagnosis not present

## 2015-12-19 MED ORDER — DOXEPIN HCL 3 MG PO TABS: 1.0000 | ORAL_TABLET | Freq: Every evening | ORAL | Status: DC | PRN

## 2015-12-19 NOTE — Progress Notes (Signed)
Subjective:  Patient ID: [name], male    DOB: March 22, 1941  Age: 75 y.o. MRN: [id number]  CC: Hypertension and COPD   HPI [name] presents for follow-up on hypertension and COPD. He complains of fatigue, insomnia (frequent awakenings), intermittent/chronic palpitations, baseline shortness of breath, and mild wheezing. He is getting significant symptom relief with Anoro. He had a normal cardiac event monitor about 2 months ago.  He is status post CVA about 3 or 4 months ago and tells me his symptoms are gradually improving. He has a mild sensation of ataxia but no focal complaints.  Outpatient Prescriptions Prior to Visit  Medication Sig Dispense Refill  . cetirizine (ZYRTEC) 10 MG tablet Take 1 tablet (10 mg total) by mouth daily. 30 tablet 11  . dicyclomine (BENTYL) 10 MG capsule Take one capsule by mouth before meals three times a day 270 capsule 1  . diltiazem (TIAZAC) 360 MG 24 hr capsule Take 1 capsule (360 mg total) by mouth daily. 90 capsule 3  . gabapentin (NEURONTIN) 100 MG capsule Take 1 cap at bedtime for 7 days, then 2 caps at bedtime. 60 capsule 0  . HYDROcodone-acetaminophen (NORCO/VICODIN) 5-325 MG tablet Take 1 tablet by mouth every 6 (six) hours as needed for moderate pain.    . metoprolol succinate (TOPROL-XL) 25 MG 24 hr tablet Take 1 tablet (25 mg total) by mouth 2 (two) times daily. 180 tablet 3  . Multiple Vitamin (MULTIVITAMIN WITH MINERALS) TABS tablet Take 1 tablet by mouth daily.    Marland Kitchen omeprazole (PRILOSEC) 20 MG capsule TAKE ONE CAPSULE BY MOUTH TWICE DAILY 180 capsule 1  . pravastatin (PRAVACHOL) 40 MG tablet Take 1 tablet (40 mg total) by mouth every evening. 90 tablet 3  . topiramate (TOPAMAX) 25 MG tablet Take 25 mg by mouth daily as needed (for headaches). Reported on 12/11/2015    . umeclidinium-vilanterol (ANORO ELLIPTA) 62.5-25 MCG/INH AEPB Inhale 1 puff into the lungs daily. 30 each 11  . traZODone (DESYREL) 50 MG tablet TAKE 1 TABLET BY MOUTH  EVERY NIGHT AT BEDTIME 90 tablet 2   No facility-administered medications prior to visit.    ROS Review of Systems  Constitutional: Positive for fatigue. Negative for fever, chills and unexpected weight change.  HENT: Negative.   Eyes: Negative.   Respiratory: Positive for shortness of breath and wheezing. Negative for cough and stridor.   Cardiovascular: Negative.  Negative for chest pain, palpitations and leg swelling.  Gastrointestinal: Negative.  Negative for nausea, vomiting, abdominal pain, diarrhea and constipation.  Endocrine: Negative.   Genitourinary: Negative.   Musculoskeletal: Positive for gait problem.  Skin: Negative.   Allergic/Immunologic: Negative.   Neurological: Negative.   Hematological: Negative.  Negative for adenopathy. Does not bruise/bleed easily.  Psychiatric/Behavioral: Positive for sleep disturbance. Negative for suicidal ideas, dysphoric mood and decreased concentration.    Objective:  BP 140/70 mmHg  Pulse 81  Temp(Src) 98.6 F (37 C) (Oral)  Resp 20  Ht 5\' 6"  (1.676 m)  Wt 135 lb (61.236 kg)  BMI 21.80 kg/m2  SpO2 96%  BP Readings from Last 3 Encounters:  12/19/15 140/70  12/11/15 136/78  09/30/15 140/84    Wt Readings from Last 3 Encounters:  12/19/15 135 lb (61.236 kg)  12/11/15 134 lb (60.782 kg)  10/07/15 138 lb (62.596 kg)    Physical Exam  Constitutional: He is oriented to person, place, and time. No distress.  HENT:  Mouth/Throat: Oropharynx is clear and moist. No oropharyngeal  exudate.  Eyes: Conjunctivae are normal. Right eye exhibits no discharge. Left eye exhibits no discharge. No scleral icterus.  Neck: Normal range of motion. Neck supple. No JVD present. No tracheal deviation present. No thyromegaly present.  Cardiovascular: Normal rate, regular rhythm, normal heart sounds and intact distal pulses.  Exam reveals no gallop and no friction rub.   No murmur heard. Pulmonary/Chest: Effort normal. No accessory muscle usage  or stridor. No respiratory distress. He has no decreased breath sounds. He has no wheezes. He has rhonchi in the right lower field and the left lower field. He has no rales. He exhibits no tenderness.  Abdominal: Soft. Bowel sounds are normal. He exhibits no distension and no mass. There is no tenderness. There is no rebound and no guarding.  Musculoskeletal: Normal range of motion. He exhibits no edema or tenderness.  Lymphadenopathy:    He has no cervical adenopathy.  Neurological: He is oriented to person, place, and time.  Skin: Skin is warm and dry. No rash noted. He is not diaphoretic. No erythema. No pallor.  Psychiatric: He has a normal mood and affect. His behavior is normal. Judgment and thought content normal.  Vitals reviewed.   Lab Results  Component Value Date   WBC 9.8 09/30/2015   HGB 14.8 09/30/2015   HCT 43.6 09/30/2015   PLT 281.0 09/30/2015   GLUCOSE 93 09/30/2015   CHOL 142 08/07/2015   TRIG 133.0 08/07/2015   HDL 45.70 08/07/2015   LDLDIRECT 134.5 08/22/2007   LDLCALC 70 08/07/2015   ALT 21 06/16/2014   AST 23 06/16/2014   NA 139 09/30/2015   K 4.3 09/30/2015   CL 104 09/30/2015   CREATININE 0.70 09/30/2015   BUN 19 09/30/2015   CO2 29 09/30/2015   TSH 1.26 08/07/2015   PSA 6.69* 08/22/2007   INR 0.97 01/16/2014    Mr Brain W Wo Contrast  10/18/2015  CLINICAL DATA:  Abnormal brain MRI 10/07/2015, difficulty walking, prostate cancer. EXAM: MRI HEAD WITHOUT AND WITH CONTRAST TECHNIQUE: Multiplanar, multiecho pulse sequences of the brain and surrounding structures were obtained without and with intravenous contrast. CONTRAST:  35mL MULTIHANCE GADOBENATE DIMEGLUMINE 529 MG/ML IV SOLN COMPARISON:  Noncontrast exam 10/07/2015. FINDINGS: Redemonstrated is an area of abnormal gray and white matter signal abnormality in the LEFT frontal lobe, anterior LEFT MCA territory. Pseudonormalization of restricted diffusion. Modest T1 shortening precontrast redemonstrated. T1  shortening precontrast. Continued susceptibility on gradient sequence. Slight retraction on FLAIR imaging. Post infusion, marked gyriform enhancement, most consistent with subacute ischemia and reperfusion with blood brain barrier breakdown. No other significant interval change from 05/01. IMPRESSION: Findings most consistent with subacute LEFT MCA territory infarction. Gyriform enhancement within the LEFT frontal cortex indicating blood brain barrier breakdown. No evidence for primary brain tumor or metastatic prostate cancer Electronically Signed   By: Staci Righter M.D.   On: 10/18/2015 15:49    Assessment & Plan:   Finas was seen today for hypertension and copd.  Diagnoses and all orders for this visit:  Essential hypertension- his blood pressures adequately well controlled with metoprolol and diltiazem.  Obstructive chronic bronchitis with exacerbation ([place])- improvement noted with the LABA/LAMA combination  Middle insomnia- will try silenor -     Doxepin HCl (SILENOR) 3 MG TABS; Take 1 tablet (3 mg total) by mouth at bedtime as needed.  Need for prophylactic vaccination against Streptococcus pneumoniae (pneumococcus) -     Pneumococcal polysaccharide vaccine 23-valent greater than or equal to 2yo subcutaneous/IM  Cerebral  infarction due to thrombosis of left middle cerebral artery ([place])- improvement noted, he has an ongoing evaluation with neurology  PALPITATIONS, CHRONIC- recent event monitor was normal, symptoms are stable, this is a benign issue, he tells me that he sees cardiology soon about this   I have discontinued Mr. Weigandt traZODone. I am also having him start on Doxepin HCl. Additionally, I am having him maintain his multivitamin with minerals, topiramate, diltiazem, dicyclomine, pravastatin, metoprolol succinate, cetirizine, umeclidinium-vilanterol, omeprazole, HYDROcodone-acetaminophen, and gabapentin.  Meds ordered this encounter  Medications  . Doxepin HCl (SILENOR) 3  MG TABS    Sig: Take 1 tablet (3 mg total) by mouth at bedtime as needed.    Dispense:  90 tablet    Refill:  3     Follow-up: Return in about 6 months (around 06/20/2016).  Scarlette Calico, MD

## 2015-12-19 NOTE — Patient Instructions (Signed)
Insomnia Insomnia is a sleep disorder that makes it difficult to fall asleep or to stay asleep. Insomnia can cause tiredness (fatigue), low energy, difficulty concentrating, mood swings, and poor performance at work or school.  There are three different ways to classify insomnia:  Difficulty falling asleep.  Difficulty staying asleep.  Waking up too early in the morning. Any type of insomnia can be long-term (chronic) or short-term (acute). Both are common. Short-term insomnia usually lasts for three months or less. Chronic insomnia occurs at least three times a week for longer than three months. CAUSES  Insomnia may be caused by another condition, situation, or substance, such as:  Anxiety.  Certain medicines.  Gastroesophageal reflux disease (GERD) or other gastrointestinal conditions.  Asthma or other breathing conditions.  Restless legs syndrome, sleep apnea, or other sleep disorders.  Chronic pain.  Menopause. This may include hot flashes.  Stroke.  Abuse of alcohol, tobacco, or illegal drugs.  Depression.  Caffeine.   Neurological disorders, such as Alzheimer disease.  An overactive thyroid (hyperthyroidism). The cause of insomnia may not be known. RISK FACTORS Risk factors for insomnia include:  Gender. Women are more commonly affected than men.  Age. Insomnia is more common as you get older.  Stress. This may involve your professional or personal life.  Income. Insomnia is more common in people with lower income.  Lack of exercise.   Irregular work schedule or night shifts.  Traveling between different time zones. SIGNS AND SYMPTOMS If you have insomnia, trouble falling asleep or trouble staying asleep is the main symptom. This may lead to other symptoms, such as:  Feeling fatigued.  Feeling nervous about going to sleep.  Not feeling rested in the morning.  Having trouble concentrating.  Feeling irritable, anxious, or depressed. TREATMENT   Treatment for insomnia depends on the cause. If your insomnia is caused by an underlying condition, treatment will focus on addressing the condition. Treatment may also include:   Medicines to help you sleep.  Counseling or therapy.  Lifestyle adjustments. HOME CARE INSTRUCTIONS   Take medicines only as directed by your health care provider.  Keep regular sleeping and waking hours. Avoid naps.  Keep a sleep diary to help you and your health care provider figure out what could be causing your insomnia. Include:   When you sleep.  When you wake up during the night.  How well you sleep.   How rested you feel the next day.  Any side effects of medicines you are taking.  What you eat and drink.   Make your bedroom a comfortable place where it is easy to fall asleep:  Put up shades or special blackout curtains to block light from outside.  Use a white noise machine to block noise.  Keep the temperature cool.   Exercise regularly as directed by your health care provider. Avoid exercising right before bedtime.  Use relaxation techniques to manage stress. Ask your health care provider to suggest some techniques that may work well for you. These may include:  Breathing exercises.  Routines to release muscle tension.  Visualizing peaceful scenes.  Cut back on alcohol, caffeinated beverages, and cigarettes, especially close to bedtime. These can disrupt your sleep.  Do not overeat or eat spicy foods right before bedtime. This can lead to digestive discomfort that can make it hard for you to sleep.  Limit screen use before bedtime. This includes:  Watching TV.  Using your smartphone, tablet, and computer.  Stick to a routine. This   can help you fall asleep faster. Try to do a quiet activity, brush your teeth, and go to bed at the same time each night.  Get out of bed if you are still awake after 15 minutes of trying to sleep. Keep the lights down, but try reading or  doing a quiet activity. When you feel sleepy, go back to bed.  Make sure that you drive carefully. Avoid driving if you feel very sleepy.  Keep all follow-up appointments as directed by your health care provider. This is important. SEEK MEDICAL CARE IF:   You are tired throughout the day or have trouble in your daily routine due to sleepiness.  You continue to have sleep problems or your sleep problems get worse. SEEK IMMEDIATE MEDICAL CARE IF:   You have serious thoughts about hurting yourself or someone else.   This information is not intended to replace advice given to you by your health care provider. Make sure you discuss any questions you have with your health care provider.   Document Released: 05/22/2000 Document Revised: 02/13/2015 Document Reviewed: 02/23/2014 Elsevier Interactive Patient Education 2016 [employer] Inc.  

## 2015-12-19 NOTE — Progress Notes (Signed)
Pre visit review using our clinic review tool, if applicable. No additional management support is needed unless otherwise documented below in the visit note. 

## 2015-12-20 ENCOUNTER — Ambulatory Visit (HOSPITAL_COMMUNITY)
Admission: RE | Admit: 2015-12-20 | Discharge: 2015-12-20 | Disposition: A | Discharge status: Home / Self Care | Payer: Medicare HMO | Source: Ambulatory Visit | Attending: Neurology | Admitting: Neurology

## 2015-12-20 ENCOUNTER — Ambulatory Visit (HOSPITAL_COMMUNITY): Admission: RE | Admit: 2015-12-20 | Payer: Medicare HMO | Source: Ambulatory Visit

## 2015-12-20 DIAGNOSIS — Z8673 Personal history of transient ischemic attack (TIA), and cerebral infarction without residual deficits: Secondary | ICD-10-CM | POA: Diagnosis present

## 2015-12-20 DIAGNOSIS — I10 Essential (primary) hypertension: Secondary | ICD-10-CM | POA: Insufficient documentation

## 2015-12-20 DIAGNOSIS — I6501 Occlusion and stenosis of right vertebral artery: Secondary | ICD-10-CM | POA: Diagnosis not present

## 2015-12-20 DIAGNOSIS — I6521 Occlusion and stenosis of right carotid artery: Secondary | ICD-10-CM | POA: Diagnosis not present

## 2015-12-20 DIAGNOSIS — I63312 Cerebral infarction due to thrombosis of left middle cerebral artery: Secondary | ICD-10-CM

## 2015-12-20 LAB — CREATININE, SERUM: Creatinine, Ser: 0.72 mg/dL (ref 0.61–1.24)

## 2015-12-20 MED ORDER — GADOBENATE DIMEGLUMINE 529 MG/ML IV SOLN
10.0000 mL | Freq: Once | INTRAVENOUS | Status: AC
  Administered 2015-12-20: 8 mL via INTRAVENOUS

## 2015-12-23 ENCOUNTER — Telehealth: Payer: Self-pay

## 2015-12-23 NOTE — Telephone Encounter (Signed)
-----   Message from Pieter Partridge, DO sent at 12/23/2015  7:12 AM EDT ----- MRA of head and neck look okay

## 2015-12-26 ENCOUNTER — Ambulatory Visit (INDEPENDENT_AMBULATORY_CARE_PROVIDER_SITE_OTHER): Payer: Medicare HMO | Admitting: Internal Medicine

## 2015-12-26 ENCOUNTER — Ambulatory Visit (HOSPITAL_COMMUNITY): Payer: Medicare HMO | Attending: Cardiology

## 2015-12-26 ENCOUNTER — Encounter (INDEPENDENT_AMBULATORY_CARE_PROVIDER_SITE_OTHER): Payer: Self-pay

## 2015-12-26 ENCOUNTER — Encounter: Payer: Self-pay | Admitting: Internal Medicine

## 2015-12-26 ENCOUNTER — Other Ambulatory Visit: Payer: Self-pay

## 2015-12-26 VITALS — BP 154/80 | HR 72 | Ht 66.0 in | Wt 138.2 lb

## 2015-12-26 DIAGNOSIS — I059 Rheumatic mitral valve disease, unspecified: Secondary | ICD-10-CM | POA: Diagnosis not present

## 2015-12-26 DIAGNOSIS — Z720 Tobacco use: Secondary | ICD-10-CM | POA: Diagnosis not present

## 2015-12-26 DIAGNOSIS — Z8249 Family history of ischemic heart disease and other diseases of the circulatory system: Secondary | ICD-10-CM | POA: Insufficient documentation

## 2015-12-26 DIAGNOSIS — I63312 Cerebral infarction due to thrombosis of left middle cerebral artery: Secondary | ICD-10-CM | POA: Diagnosis not present

## 2015-12-26 DIAGNOSIS — J449 Chronic obstructive pulmonary disease, unspecified: Secondary | ICD-10-CM | POA: Insufficient documentation

## 2015-12-26 DIAGNOSIS — I2510 Atherosclerotic heart disease of native coronary artery without angina pectoris: Secondary | ICD-10-CM | POA: Diagnosis not present

## 2015-12-26 DIAGNOSIS — E785 Hyperlipidemia, unspecified: Secondary | ICD-10-CM | POA: Insufficient documentation

## 2015-12-26 DIAGNOSIS — I639 Cerebral infarction, unspecified: Secondary | ICD-10-CM | POA: Diagnosis present

## 2015-12-26 NOTE — Progress Notes (Signed)
ELECTROPHYSIOLOGY CONSULT NOTE  Patient ID: [name], MRN: [id number], DOB/AGE: 1940/07/10 75 y.o. Admit date: (Not on file) Date of Consult: 12/26/2015  Primary Physician: Scarlette Calico, MD Primary Cardiologist: DM  Consulting Physician AJaffe  Chief Complaint: Cryptogenic Stroke    HPI [name] is a 75 y.o. male  Referred for consideration of implantable loop recorder.  He has a history of a stroke 5/17. MRI imaging demonstrated a left MCA infarction. He was given a 30 day event recorder demonstrated PACs and PVCs but no atrial fibrillation. Echocardiogram 2/16 demonstrated normal LV function and a repeat echocardiogram is pending   Past Medical History  Diagnosis Date  . Hyperlipidemia   . Hypertension   . GERD (gastroesophageal reflux disease)   . ED (erectile dysfunction)   . Peyronie's disease   . Depression   . Hemorrhoids 2002  . History of kidney stones 2014    right ureteral lithotripsy 01/2013  . IBS (irritable bowel syndrome)   . Ischemic colitis ([place])   . History of esophageal dilatation     FOR STRICTURE 2008  . History of peptic ulcer   . History of duodenal ulcer   . Diverticulosis, sigmoid   . Coronary artery disease CARDIOLOGIST-- DR Rayann Heman    NON-OBSTRUCTIVE CAD--- 2005 Cath with 30% LAD, calcification/plaque of LAD, EF 60%  . History of prostate cancer     S/P RADICAL PROSTATECTOMY 2009  . Right ureteral stone   . SUI (stress urinary incontinence), male   . PAC (premature atrial contraction)   . Adenoma of left adrenal gland     stable per ct  . History of gallstones   . Frequency of urination   . Nocturia   . OA (osteoarthritis)     RIGHT SHOULDER/ HIPS  . DDD (degenerative disc disease), lumbosacral   . 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 Wears glasses   . Eczema   . Encopresis       Surgical History:  Past Surgical History  Procedure Laterality Date  . Nasal septum surgery  YRS AGO  . Flexible  sigmoidoscopy N/A 03/19/2014    Procedure: FLEXIBLE SIGMOIDOSCOPY;  Surgeon: Inda Castle, MD;  Location: [facility] [facility];  Service: Endoscopy;  Laterality: N/A;  . Radical prostatectomy/ bilateral retroperitoneal lymph node dissection  12-29-2007  . Tonsillectomy and adenoidectomy  as child  . Lumbar fusion  03-26-2005    Revision laminectomy L5 - S1,  Laminectomy and Decompression L4-L5, fusion L4 -- S1  . Removal benign right eyelid lesion  12-19-2007  . Cardiovascular stress test  12-23-2012  DR ALLRED    Low risk lexiscan study with small mild intensity, fixed inferior defect consistent with thinning, no ischemia/  normal LVF and wall motion, ef 66%  . Transthoracic echocardiogram  07-13-2014    mild LVH/  ef 60-655,  grade I diastolic dysfunction/  trivial AR, MR and TR/  mild LAE  . Cardiac catheterization  07-23-2003    Non-obstructive CAD/  30% narrowing ostium LM,  30-40% eccentric calcified plaque LAD, 20% narrowing RCA/  preserved LVF  . Inguinal hernia repair Bilateral 03-17-2010  . Sling cystourethropexy (advance)  10-12-2009  . Lumbar laminectomy  1999    L5  -- S1  . Extracorporeal shock wave lithotripsy Right 01-30-2013  . Esophagogastroduodenoscopy (egd) with esophageal dilation  02-04-2001  . Colonoscopy  last one 06-19-2011  . Cystoscopy/retrograde/ureteroscopy/stone extraction with basket Right 07/19/2014    Procedure:  RIGHT URETEROSCOPY/STONE EXTRACTION WITH BASKET;  Surgeon: Malka So, MD;  Location: [facility];  Service: Urology;  Laterality: Right;  . Cystoscopy w/ retrogrades Right 07/19/2014    Procedure: CYSTOSCOPY WITH RETROGRADE PYELOGRAM;  Surgeon: Malka So, MD;  Location: [facility];  Service: Urology;  Laterality: Right;     Home Meds: Prior to Admission medications   Medication Sig Start Date End Date Taking? Authorizing Provider  cetirizine (ZYRTEC) 10 MG tablet Take 1 tablet (10 mg total) by mouth daily. 08/07/15    Janith Lima, MD  dicyclomine (BENTYL) 10 MG capsule Take one capsule by mouth before meals three times a day 07/24/15   Janith Lima, MD  diltiazem [facility]) 360 MG 24 hr capsule Take 1 capsule (360 mg total) by mouth daily. 07/05/15   Larey Dresser, MD  Doxepin HCl (SILENOR) 3 MG TABS Take 1 tablet (3 mg total) by mouth at bedtime as needed. 12/19/15   Janith Lima, MD  gabapentin (NEURONTIN) 100 MG capsule Take 1 cap at bedtime for 7 days, then 2 caps at bedtime. 12/11/15   Pieter Partridge, DO  HYDROcodone-acetaminophen (NORCO/VICODIN) 5-325 MG tablet Take 1 tablet by mouth every 6 (six) hours as needed for moderate pain.    Historical Provider, MD  metoprolol succinate (TOPROL-XL) 25 MG 24 hr tablet Take 1 tablet (25 mg total) by mouth 2 (two) times daily. 07/25/15   Larey Dresser, MD  Multiple Vitamin (MULTIVITAMIN WITH MINERALS) TABS tablet Take 1 tablet by mouth daily.    Historical Provider, MD  omeprazole (PRILOSEC) 20 MG capsule TAKE ONE CAPSULE BY MOUTH TWICE DAILY 11/06/15   Janith Lima, MD  pravastatin (PRAVACHOL) 40 MG tablet Take 1 tablet (40 mg total) by mouth every evening. 07/25/15   Larey Dresser, MD  topiramate (TOPAMAX) 25 MG tablet Take 25 mg by mouth daily as needed (for headaches). Reported on 12/11/2015    Historical Provider, MD  umeclidinium-vilanterol (ANORO ELLIPTA) 62.5-25 MCG/INH AEPB Inhale 1 puff into the lungs daily. 10/08/15   Janith Lima, MD    Allergies:  Allergies  Allergen Reactions  . Pseudoephedrine Palpitations    "Heart races"  . Tape Itching and Other (See Comments)    tears skin, can use paper tape  . Sulfa Antibiotics Itching  . Sulfacetamide Sodium Itching  . Oxycodone Other (See Comments)    "over sedates"  . Lodine [Etodolac] Diarrhea    Social History   Social History  . Marital Status: Married    Spouse Name: N/A  . Number of Children: 2  . Years of Education: N/A   Occupational History  . Retired    Social History Main  Topics  . Smoking status: Current Every Day Smoker -- 1.00 packs/day for 53 years    Types: Cigarettes  . Smokeless tobacco: Never Used  . Alcohol Use: No  . Drug Use: No  . Sexual Activity: Not on file   Other Topics Concern  . Not on file   Social History Narrative     Family History  Problem Relation Age of Onset  . Colon cancer Maternal Aunt 54  . Heart attack Father 107  . Aneurysm Father   . Lymphoma Maternal Aunt   . Bone cancer Maternal Grandfather      ROS:  Please see the history of present illness.     All other systems reviewed and negative.    Physical Exam: Blood  pressure 154/80, pulse 72, height 5\' 6"  (1.676 m), weight 138 lb 3.2 oz (62.687 kg), SpO2 96 %. General: Well developed, well nourished male in no acute distress. Head: Normocephalic, atraumatic, sclera non-icteric, no xanthomas, nares are without discharge. EENT: normal  Lymph Nodes:  none Neck: Negative for carotid bruits. JVD not elevated. Back:without scoliosis kyphosis Lungs: Clear bilaterally to auscultation without wheezes, rales, or rhonchi. Breathing is unlabored. Heart: RRR with S1 S2. No murmur . No rubs, or gallops appreciated. Abdomen: Soft, non-tender, non-distended with normoactive bowel sounds. No hepatomegaly. No rebound/guarding. No obvious abdominal masses. Msk:  Strength and tone appear normal for age. Extremities: No clubbing or cyanosis. No*  edema.  Distal pedal pulses are 2+ and equal bilaterally. Skin: Warm and Dry Neuro: Alert and oriented X 3. CN III-XII intact Grossly normal sensory and motor function . Psych:  Responds to questions appropriately with a normal affect.      Labs: Cardiac Enzymes No results for input(s): CKTOTAL, CKMB, TROPONINI in the last 72 hours. CBC Lab Results  Component Value Date   WBC 9.8 09/30/2015   HGB 14.8 09/30/2015   HCT 43.6 09/30/2015   MCV 91.8 09/30/2015   PLT 281.0 09/30/2015   PROTIME: No results for input(s): LABPROT, INR  in the last 72 hours. Chemistry   Recent Labs Lab 12/20/15 1600  CREATININE 0.72   Lipids Lab Results  Component Value Date   CHOL 142 08/07/2015   HDL 45.70 08/07/2015   LDLCALC 70 08/07/2015   TRIG 133.0 08/07/2015   BNP No results found for: PROBNP Thyroid Function Tests: No results for input(s): TSH, T4TOTAL, T3FREE, THYROIDAB in the last 72 hours.  Invalid input(s): FREET3 Miscellaneous No results found for: DDIMER  Radiology/Studies:  Mr Virgel Paling Wo Contrast  12/20/2015  CLINICAL DATA:  History of left MCA territory infarct. EXAM: MRA NECK WITHOUT AND WITH CONTRAST MRA HEAD WITHOUT CONTRAST TECHNIQUE: Multiplanar and multiecho pulse sequences of the neck were obtained without and with intravenous contrast. Angiographic images of the neck were obtained using MRA technique without and with intravenous contast.; Angiographic images of the Circle of Willis were obtained using MRA technique without intravenous contrast. CONTRAST:  33mL MULTIHANCE GADOBENATE DIMEGLUMINE 529 MG/ML IV SOLN COMPARISON:  Brain MRI 10/18/2015 FINDINGS: MRA NECK FINDINGS There is mild narrowing of the proximal right internal carotid artery without hemodynamically significant stenosis. The left carotid system is normal. There is moderate narrowing of the right vertebral artery origin. Otherwise, the vertebral arteries are normal. MRA HEAD FINDINGS Intracranial internal carotid arteries: Normal. Anterior cerebral arteries: Congenitally hypoplastic right A1 segment. Otherwise normal anterior cerebral arteries. Middle cerebral arteries: Normal. Posterior communicating arteries: Present bilaterally. Posterior cerebral arteries: Bilateral fetal predominant origins. Basilar artery: Normal. Vertebral arteries: Codominant. Normal. Superior cerebellar arteries: Normal. Anterior inferior cerebellar arteries: Normal. Posterior inferior cerebellar arteries: Normal. IMPRESSION: 1. Normal MRA of the head. 2. Moderate narrowing  of the right vertebral artery origin. 3. Mild atherosclerotic narrowing of the proximal right internal carotid artery without hemodynamically significant stenosis. Electronically Signed   By: Ulyses Jarred M.D.   On: 12/20/2015 19:10   Mr Angiogram Neck W Wo Contrast  12/20/2015  CLINICAL DATA:  History of left MCA territory infarct. EXAM: MRA NECK WITHOUT AND WITH CONTRAST MRA HEAD WITHOUT CONTRAST TECHNIQUE: Multiplanar and multiecho pulse sequences of the neck were obtained without and with intravenous contrast. Angiographic images of the neck were obtained using MRA technique without and with intravenous contast.; Angiographic images of the  Circle of Willis were obtained using MRA technique without intravenous contrast. CONTRAST:  28mL MULTIHANCE GADOBENATE DIMEGLUMINE 529 MG/ML IV SOLN COMPARISON:  Brain MRI 10/18/2015 FINDINGS: MRA NECK FINDINGS There is mild narrowing of the proximal right internal carotid artery without hemodynamically significant stenosis. The left carotid system is normal. There is moderate narrowing of the right vertebral artery origin. Otherwise, the vertebral arteries are normal. MRA HEAD FINDINGS Intracranial internal carotid arteries: Normal. Anterior cerebral arteries: Congenitally hypoplastic right A1 segment. Otherwise normal anterior cerebral arteries. Middle cerebral arteries: Normal. Posterior communicating arteries: Present bilaterally. Posterior cerebral arteries: Bilateral fetal predominant origins. Basilar artery: Normal. Vertebral arteries: Codominant. Normal. Superior cerebellar arteries: Normal. Anterior inferior cerebellar arteries: Normal. Posterior inferior cerebellar arteries: Normal. IMPRESSION: 1. Normal MRA of the head. 2. Moderate narrowing of the right vertebral artery origin. 3. Mild atherosclerotic narrowing of the proximal right internal carotid artery without hemodynamically significant stenosis. Electronically Signed   By: Ulyses Jarred M.D.   On:  12/20/2015 19:10    EKG: SR 15/07/35   Assessment and Plan:   Cryptogenic stroke  Leg pain w exertion-- ABIs normal   Chest pain exertional  myoview neg 2014  Premature atrial and ventricular beats  Fatigue    The patient had a cryptogenic stroke identified in May.  He is not on antiplatlet therapy and I have reached out ot Dr Tomi Likens in this regard.  He is appropirately considered for implantable monitor to look for atrial fib as a cause of Cryptogenic Stroke Have reviewed risks and benefits  He has symptoms of angina and claudication; however, relatively recent evaluation of coronary artery perfusion and very recent ABIs are normal. We'll defer further evaluation to Dr.   He has significant fatigue. His stress level is enormous. He has early a.m. awakening. I wonder whether there isn't a component of depression. I have suggested that he follow-up with his primary care physician regarding this.  In addition, it is not at all clear to me that his ectopy is a cause of his fatigue, as he has no associated palpitations. At this point I would not pursue further therapy   Virl Axe

## 2015-12-26 NOTE — Patient Instructions (Signed)
Medication Instructions: - Your physician recommends that you continue on your current medications as directed. Please refer to the Current Medication list given to you today.  Labwork: - none  Procedures/Testing: - You physician has recommended that you have an implantable loop recorder (LINQ) monitor placed.  Available dates below: Wed 8/2 Mon 8/7 Thurs 8/10 Mon 8/14 Wed 8/23  ** Call Heather, Dr. Olin Pia nurse when ready to schedule- I will be back in the office on Monday [phone] **  Follow-Up: - Your physician recommends that you schedule a follow-up appointment in: 10-14 days (from procedure date) for a wound check appointment with the Device Clnic  Any Additional Special Instructions Will Be Listed Below (If Applicable).     If you need a refill on your cardiac medications before your next appointment, please call your pharmacy.

## 2015-12-27 ENCOUNTER — Telehealth: Payer: Self-pay

## 2015-12-27 NOTE — Telephone Encounter (Signed)
Pt not home. Wife will have him call back when he comes in.

## 2015-12-27 NOTE — Telephone Encounter (Signed)
-----   Message from Pieter Partridge, DO sent at 12/27/2015  7:15 AM EDT ----- Echo looks okay.  At his visit, I advised him to start aspirin 81mg  daily to prevent further stroke.  I want to make sure that he has started taking this.

## 2015-12-30 ENCOUNTER — Telehealth: Payer: Self-pay | Admitting: Internal Medicine

## 2015-12-30 ENCOUNTER — Encounter: Payer: Self-pay | Admitting: *Deleted

## 2015-12-30 NOTE — Telephone Encounter (Signed)
NeW Message  Pt requested to speak w/ RN concerning possible loop recorder. Please call back and discuss.

## 2015-12-30 NOTE — Telephone Encounter (Signed)
I called and spoke with the patient. LINQ implant is scheduled for 01/13/16 at 12 pm. He cannot come earlier in the day for his procedure as he is a caregiver for his wife.  Verbal instructions given to the patient. Letter of instructions mailed.

## 2015-12-30 NOTE — Telephone Encounter (Signed)
Message relayed to patient. Verbalized understanding and denied questions. Pt is taking aspirin 81 mg daily.

## 2015-12-30 NOTE — Telephone Encounter (Signed)
I called and spoke with the patient. He wants to schedule his LINQ implant for 01/13/16. I advised him I will call to schedule and call him back to confirm the time. He is agreeable.

## 2016-01-01 ENCOUNTER — Ambulatory Visit (INDEPENDENT_AMBULATORY_CARE_PROVIDER_SITE_OTHER): Payer: Medicare HMO | Admitting: Neurology

## 2016-01-01 DIAGNOSIS — I63312 Cerebral infarction due to thrombosis of left middle cerebral artery: Secondary | ICD-10-CM | POA: Diagnosis not present

## 2016-01-01 DIAGNOSIS — M5417 Radiculopathy, lumbosacral region: Secondary | ICD-10-CM

## 2016-01-01 NOTE — Procedures (Signed)
[facility]  [address], [address]  [place], [place] [address] Tel: [phone] Fax:  [phone] Test Date:  01/01/2016  Patient: [name] DOB: 11-29-1940 Physician: Narda Amber, DO  Sex: Male Height: 5\' 6"  Ref Phys: Metta Clines  ID#: [id number] Temp: 33.1C Technician: Jerilynn Mages. Dean   Patient Complaints: This is a 75 year old gentleman referred for evaluation of bilateral feet numbness and leg weakness.  NCV & EMG Findings: Extensive electrodiagnostic testing of the right lower extremity and additional studies of the left shows: 1. Bilateral sural and superficial peroneal sensory responses are within normal limits. 2. Left peroneal motor nerve shows reduced amplitude (2.2 mV). Bilateral tibial and right peroneal motor responses are within normal limits. 3. H reflex studies are within normal limits. 4. Chronic motor axon loss changes are seen affecting L4-S1 myotomes bilaterally, without accompanied active denervation.  Impression: 1. 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 2. There is no evidence of a generalized sensorimotor polyneuropathy affecting the lower extremities. However, small fiber neuropathy cannot be excluded by this study.   ___________________________ Narda Amber, DO    Nerve Conduction Studies Anti Sensory Summary Table   Site NR Peak (ms) Norm Peak (ms) P-T Amp (V) Norm P-T Amp  Left Sup Peroneal Anti Sensory (Ant Lat Mall)  12 cm    3.3 <4.6 5.4 >3  Right Sup Peroneal Anti Sensory (Ant Lat Mall)  12 cm    3.7 <4.6 4.9 >3  Left Sural Anti Sensory (Lat Mall)  Calf    3.7 <4.6 6.2 >3  Right Sural Anti Sensory (Lat Mall)  Calf    4.1 <4.6 7.0 >3   Motor Summary Table   Site NR Onset (ms) Norm Onset (ms) O-P Amp (mV) Norm O-P Amp Site1 Site2 Delta-0 (ms) Dist (cm) Vel (m/s) Norm Vel (m/s)  Left Peroneal Motor (Ext Dig Brev)  Ankle    3.6 <6.0 2.2 >2.5 B Fib Ankle 7.6  33.0 43 >40  B Fib    11.2  1.7  Poplt B Fib 2.2 10.0 45 >40  Poplt    13.4  1.7         Right Peroneal Motor (Ext Dig Brev)  Ankle    3.8 <6.0 2.6 >2.5 B Fib Ankle 7.7 32.0 42 >40  B Fib    11.5  2.5  Poplt B Fib 2.4 10.0 42 >40  Poplt    13.9  2.5         Left Peroneal TA Motor (Tib Ant)  Fib Head    2.5 <4.5 4.0 >3 Poplit Fib Head 1.9 10.0 53 >40  Poplit    4.4  4.0         Left Tibial Motor (Abd Hall Brev)  Ankle    4.0 <6.0 7.4 >4 Knee Ankle 9.2 41.0 45 >40  Knee    13.2  3.6         Right Tibial Motor (Abd Hall Brev)  Ankle    3.6 <6.0 8.5 >4 Knee Ankle 9.1 39.0 43 >40  Knee    12.7  5.6          H Reflex Studies   NR H-Lat (ms) Lat Norm (ms) L-R H-Lat (ms) M-Lat (ms) HLat-MLat (ms)  Left Tibial (Gastroc)     34.56 <35 0.41 3.95 30.61  Right Tibial (Gastroc)     34.97 <35 0.41 3.95 31.02   EMG   Side Muscle Ins Act Fibs Psw  Fasc Number Recrt Dur Dur. Amp Amp. Poly Poly. Comment  Left AntTibialis Nml Nml Nml Nml 1- Rapid Some 1+ Some 1+ Nml Nml N/A  Left Gastroc Nml Nml Nml Nml 1- Rapid Some 1+ Some 1+ Nml Nml N/A  Left Flex Dig Long Nml Nml Nml Nml 1- Rapid Some 1+ Some 1+ Nml Nml N/A  Left RectFemoris Nml Nml Nml Nml 1- Mod-R Few 1+ Few 1+ Nml Nml N/A  Left GluteusMed Nml Nml Nml Nml 1- Rapid Some 1+ Some 1+ Nml Nml N/A  Left BicepsFemS Nml Nml Nml Nml 1- Mod-R Few 1+ Few 1+ Nml Nml N/A  Right AntTibialis Nml Nml Nml Nml 1- Rapid Some 1+ Some 1+ Nml Nml N/A  Right Gastroc Nml Nml Nml Nml 1- Rapid Some 1+ Some 1+ Nml Nml N/A  Right Flex Dig Long Nml Nml Nml Nml 1- Rapid Some 1+ Some 1+ Nml Nml N/A  Right RectFemoris Nml Nml Nml Nml 1- Mod-R Few 1+ Few 1+ Nml Nml N/A  Right Iliacus Nml Nml Nml Nml Nml Nml Nml Nml Nml Nml Nml Nml N/A  Right GluteusMed Nml Nml Nml Nml 1- Rapid Some 1+ Some 1+ Nml Nml N/A      Waveforms:

## 2016-01-02 ENCOUNTER — Other Ambulatory Visit: Payer: Self-pay | Admitting: Neurology

## 2016-01-02 ENCOUNTER — Telehealth: Payer: Self-pay

## 2016-01-02 DIAGNOSIS — M5417 Radiculopathy, lumbosacral region: Secondary | ICD-10-CM

## 2016-01-02 NOTE — Telephone Encounter (Signed)
-----   Message from Pieter Partridge, DO sent at 01/02/2016  7:18 AM EDT ----- Nerve test reveals only old changes related to pinched nerves in the back.  His symptoms of numbness in the feet are probably residual from prior back problems.  MRI of back in 2014 didn't show anything significant.  If the problems in the legs have progressed since then, then we can repeat MRI of lumbar spine without contrast.

## 2016-01-02 NOTE — Telephone Encounter (Signed)
Called and spoke with patient. Results were relayed. Pt stated his symptoms have continued to worsen. Will set up MRI. Will get authorization before scheduling, as pt was concerned about [employer] covering some locations.

## 2016-01-06 ENCOUNTER — Ambulatory Visit: Payer: Medicare HMO

## 2016-01-08 ENCOUNTER — Telehealth: Payer: Self-pay | Admitting: Neurology

## 2016-01-08 NOTE — Telephone Encounter (Signed)
Called patient back and informed him that his MRI has been authorized.

## 2016-01-08 NOTE — Telephone Encounter (Signed)
VM-PT left a message he has a question about his MRI/Dawn CB#[phone]

## 2016-01-10 ENCOUNTER — Other Ambulatory Visit: Payer: Medicare HMO

## 2016-01-11 ENCOUNTER — Other Ambulatory Visit: Payer: Self-pay | Admitting: Internal Medicine

## 2016-01-11 ENCOUNTER — Ambulatory Visit
Admission: RE | Admit: 2016-01-11 | Discharge: 2016-01-11 | Disposition: A | Discharge status: Home / Self Care | Payer: Medicare HMO | Source: Ambulatory Visit | Attending: Neurology | Admitting: Neurology

## 2016-01-11 DIAGNOSIS — M5417 Radiculopathy, lumbosacral region: Secondary | ICD-10-CM

## 2016-01-13 ENCOUNTER — Ambulatory Visit (HOSPITAL_COMMUNITY)
Admission: RE | Admit: 2016-01-13 | Discharge: 2016-01-13 | Disposition: A | Discharge status: Home / Self Care | Payer: Medicare HMO | Source: Ambulatory Visit | Attending: Internal Medicine | Admitting: Internal Medicine

## 2016-01-13 ENCOUNTER — Telehealth: Payer: Self-pay

## 2016-01-13 ENCOUNTER — Encounter (HOSPITAL_COMMUNITY)
Admission: RE | Disposition: A | Discharge status: Home / Self Care | Payer: Self-pay | Source: Ambulatory Visit | Attending: Internal Medicine

## 2016-01-13 DIAGNOSIS — F17210 Nicotine dependence, cigarettes, uncomplicated: Secondary | ICD-10-CM | POA: Insufficient documentation

## 2016-01-13 DIAGNOSIS — R350 Frequency of micturition: Secondary | ICD-10-CM | POA: Diagnosis not present

## 2016-01-13 DIAGNOSIS — I491 Atrial premature depolarization: Secondary | ICD-10-CM | POA: Diagnosis not present

## 2016-01-13 DIAGNOSIS — Z8546 Personal history of malignant neoplasm of prostate: Secondary | ICD-10-CM | POA: Diagnosis not present

## 2016-01-13 DIAGNOSIS — Z808 Family history of malignant neoplasm of other organs or systems: Secondary | ICD-10-CM | POA: Insufficient documentation

## 2016-01-13 DIAGNOSIS — Z882 Allergy status to sulfonamides status: Secondary | ICD-10-CM | POA: Diagnosis not present

## 2016-01-13 DIAGNOSIS — K589 Irritable bowel syndrome without diarrhea: Secondary | ICD-10-CM | POA: Diagnosis not present

## 2016-01-13 DIAGNOSIS — N393 Stress incontinence (female) (male): Secondary | ICD-10-CM | POA: Insufficient documentation

## 2016-01-13 DIAGNOSIS — M5137 Other intervertebral disc degeneration, lumbosacral region: Secondary | ICD-10-CM | POA: Diagnosis not present

## 2016-01-13 DIAGNOSIS — Z87442 Personal history of urinary calculi: Secondary | ICD-10-CM | POA: Insufficient documentation

## 2016-01-13 DIAGNOSIS — Z8719 Personal history of other diseases of the digestive system: Secondary | ICD-10-CM | POA: Diagnosis not present

## 2016-01-13 DIAGNOSIS — I10 Essential (primary) hypertension: Secondary | ICD-10-CM | POA: Insufficient documentation

## 2016-01-13 DIAGNOSIS — F329 Major depressive disorder, single episode, unspecified: Secondary | ICD-10-CM | POA: Diagnosis not present

## 2016-01-13 DIAGNOSIS — E785 Hyperlipidemia, unspecified: Secondary | ICD-10-CM | POA: Diagnosis not present

## 2016-01-13 DIAGNOSIS — I638 Other cerebral infarction: Secondary | ICD-10-CM | POA: Diagnosis not present

## 2016-01-13 DIAGNOSIS — R351 Nocturia: Secondary | ICD-10-CM | POA: Insufficient documentation

## 2016-01-13 DIAGNOSIS — N529 Male erectile dysfunction, unspecified: Secondary | ICD-10-CM | POA: Diagnosis not present

## 2016-01-13 DIAGNOSIS — N486 Induration penis plastica: Secondary | ICD-10-CM | POA: Diagnosis not present

## 2016-01-13 DIAGNOSIS — Z8249 Family history of ischemic heart disease and other diseases of the circulatory system: Secondary | ICD-10-CM | POA: Diagnosis not present

## 2016-01-13 DIAGNOSIS — K219 Gastro-esophageal reflux disease without esophagitis: Secondary | ICD-10-CM | POA: Diagnosis not present

## 2016-01-13 DIAGNOSIS — Z800 Family history of malignant neoplasm of digestive organs: Secondary | ICD-10-CM | POA: Insufficient documentation

## 2016-01-13 DIAGNOSIS — I2510 Atherosclerotic heart disease of native coronary artery without angina pectoris: Secondary | ICD-10-CM | POA: Diagnosis not present

## 2016-01-13 DIAGNOSIS — I639 Cerebral infarction, unspecified: Secondary | ICD-10-CM | POA: Diagnosis not present

## 2016-01-13 DIAGNOSIS — I63312 Cerebral infarction due to thrombosis of left middle cerebral artery: Secondary | ICD-10-CM | POA: Diagnosis present

## 2016-01-13 DIAGNOSIS — M1990 Unspecified osteoarthritis, unspecified site: Secondary | ICD-10-CM | POA: Insufficient documentation

## 2016-01-13 HISTORY — PX: EP IMPLANTABLE DEVICE: SHX172B

## 2016-01-13 SURGERY — LOOP RECORDER INSERTION
Anesthesia: LOCAL

## 2016-01-13 MED ORDER — LIDOCAINE-EPINEPHRINE 1 %-1:100000 IJ SOLN
INTRAMUSCULAR | Status: DC | PRN
  Administered 2016-01-13: 20 mL via INTRADERMAL

## 2016-01-13 MED ORDER — LIDOCAINE-EPINEPHRINE 1 %-1:100000 IJ SOLN
INTRAMUSCULAR | Status: AC
  Filled 2016-01-13: qty 1

## 2016-01-13 SURGICAL SUPPLY — 2 items
LOOP REVEAL LINQSYS (Prosthesis & Implant Heart) ×1 IMPLANT
PACK LOOP INSERTION (CUSTOM PROCEDURE TRAY) ×2 IMPLANT

## 2016-01-13 NOTE — H&P (View-Only) (Signed)
ELECTROPHYSIOLOGY CONSULT NOTE  Patient ID: [name], MRN: [id number], DOB/AGE: Mar 01, 1941 75 y.o. Admit date: (Not on file) Date of Consult: 12/26/2015  Primary Physician: Scarlette Calico, MD Primary Cardiologist: DM  Consulting Physician AJaffe  Chief Complaint: Cryptogenic Stroke    HPI [name] is a 75 y.o. male  Referred for consideration of implantable loop recorder.  He has a history of a stroke 5/17. MRI imaging demonstrated a left MCA infarction. He was given a 30 day event recorder demonstrated PACs and PVCs but no atrial fibrillation. Echocardiogram 2/16 demonstrated normal LV function and a repeat echocardiogram is pending   Past Medical History  Diagnosis Date  . Hyperlipidemia   . Hypertension   . GERD (gastroesophageal reflux disease)   . ED (erectile dysfunction)   . Peyronie's disease   . Depression   . Hemorrhoids 2002  . History of kidney stones 2014    right ureteral lithotripsy 01/2013  . IBS (irritable bowel syndrome)   . Ischemic colitis ([place])   . History of esophageal dilatation     FOR STRICTURE 2008  . History of peptic ulcer   . History of duodenal ulcer   . Diverticulosis, sigmoid   . Coronary artery disease CARDIOLOGIST-- DR Rayann Heman    NON-OBSTRUCTIVE CAD--- 2005 Cath with 30% LAD, calcification/plaque of LAD, EF 60%  . History of prostate cancer     S/P RADICAL PROSTATECTOMY 2009  . Right ureteral stone   . SUI (stress urinary incontinence), male   . PAC (premature atrial contraction)   . Adenoma of left adrenal gland     stable per ct  . History of gallstones   . Frequency of urination   . Nocturia   . OA (osteoarthritis)     RIGHT SHOULDER/ HIPS  . DDD (degenerative disc disease), lumbosacral   . 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 Wears glasses   . Eczema   . Encopresis       Surgical History:  Past Surgical History  Procedure Laterality Date  . Nasal septum surgery  YRS AGO  . Flexible  sigmoidoscopy N/A 03/19/2014    Procedure: FLEXIBLE SIGMOIDOSCOPY;  Surgeon: Inda Castle, MD;  Location: [place];  Service: Endoscopy;  Laterality: N/A;  . Radical prostatectomy/ bilateral retroperitoneal lymph node dissection  12-29-2007  . Tonsillectomy and adenoidectomy  as child  . Lumbar fusion  03-26-2005    Revision laminectomy L5 - S1,  Laminectomy and Decompression L4-L5, fusion L4 -- S1  . Removal benign right eyelid lesion  12-19-2007  . Cardiovascular stress test  12-23-2012  DR ALLRED    Low risk lexiscan study with small mild intensity, fixed inferior defect consistent with thinning, no ischemia/  normal LVF and wall motion, ef 66%  . Transthoracic echocardiogram  07-13-2014    mild LVH/  ef 60-655,  grade I diastolic dysfunction/  trivial AR, MR and TR/  mild LAE  . Cardiac catheterization  07-23-2003    Non-obstructive CAD/  30% narrowing ostium LM,  30-40% eccentric calcified plaque LAD, 20% narrowing RCA/  preserved LVF  . Inguinal hernia repair Bilateral 03-17-2010  . Sling cystourethropexy (advance)  10-12-2009  . Lumbar laminectomy  1999    L5  -- S1  . Extracorporeal shock wave lithotripsy Right 01-30-2013  . Esophagogastroduodenoscopy (egd) with esophageal dilation  02-04-2001  . Colonoscopy  last one 06-19-2011  . Cystoscopy/retrograde/ureteroscopy/stone extraction with basket Right 07/19/2014    Procedure:  RIGHT URETEROSCOPY/STONE EXTRACTION WITH BASKET;  Surgeon: Malka So, MD;  Location: [facility];  Service: Urology;  Laterality: Right;  . Cystoscopy w/ retrogrades Right 07/19/2014    Procedure: CYSTOSCOPY WITH RETROGRADE PYELOGRAM;  Surgeon: Malka So, MD;  Location: [facility];  Service: Urology;  Laterality: Right;     Home Meds: Prior to Admission medications   Medication Sig Start Date End Date Taking? Authorizing Provider  cetirizine (ZYRTEC) 10 MG tablet Take 1 tablet (10 mg total) by mouth daily. 08/07/15    Janith Lima, MD  dicyclomine (BENTYL) 10 MG capsule Take one capsule by mouth before meals three times a day 07/24/15   Janith Lima, MD  diltiazem [facility]) 360 MG 24 hr capsule Take 1 capsule (360 mg total) by mouth daily. 07/05/15   Larey Dresser, MD  Doxepin HCl (SILENOR) 3 MG TABS Take 1 tablet (3 mg total) by mouth at bedtime as needed. 12/19/15   Janith Lima, MD  gabapentin (NEURONTIN) 100 MG capsule Take 1 cap at bedtime for 7 days, then 2 caps at bedtime. 12/11/15   Pieter Partridge, DO  HYDROcodone-acetaminophen (NORCO/VICODIN) 5-325 MG tablet Take 1 tablet by mouth every 6 (six) hours as needed for moderate pain.    Historical Provider, MD  metoprolol succinate (TOPROL-XL) 25 MG 24 hr tablet Take 1 tablet (25 mg total) by mouth 2 (two) times daily. 07/25/15   Larey Dresser, MD  Multiple Vitamin (MULTIVITAMIN WITH MINERALS) TABS tablet Take 1 tablet by mouth daily.    Historical Provider, MD  omeprazole (PRILOSEC) 20 MG capsule TAKE ONE CAPSULE BY MOUTH TWICE DAILY 11/06/15   Janith Lima, MD  pravastatin (PRAVACHOL) 40 MG tablet Take 1 tablet (40 mg total) by mouth every evening. 07/25/15   Larey Dresser, MD  topiramate (TOPAMAX) 25 MG tablet Take 25 mg by mouth daily as needed (for headaches). Reported on 12/11/2015    Historical Provider, MD  umeclidinium-vilanterol (ANORO ELLIPTA) 62.5-25 MCG/INH AEPB Inhale 1 puff into the lungs daily. 10/08/15   Janith Lima, MD    Allergies:  Allergies  Allergen Reactions  . Pseudoephedrine Palpitations    "Heart races"  . Tape Itching and Other (See Comments)    tears skin, can use paper tape  . Sulfa Antibiotics Itching  . Sulfacetamide Sodium Itching  . Oxycodone Other (See Comments)    "over sedates"  . Lodine [Etodolac] Diarrhea    Social History   Social History  . Marital Status: Married    Spouse Name: N/A  . Number of Children: 2  . Years of Education: N/A   Occupational History  . Retired    Social History Main  Topics  . Smoking status: Current Every Day Smoker -- 1.00 packs/day for 53 years    Types: Cigarettes  . Smokeless tobacco: Never Used  . Alcohol Use: No  . Drug Use: No  . Sexual Activity: Not on file   Other Topics Concern  . Not on file   Social History Narrative     Family History  Problem Relation Age of Onset  . Colon cancer Maternal Aunt 54  . Heart attack Father 107  . Aneurysm Father   . Lymphoma Maternal Aunt   . Bone cancer Maternal Grandfather      ROS:  Please see the history of present illness.     All other systems reviewed and negative.    Physical Exam: Blood  pressure 154/80, pulse 72, height 5\' 6"  (1.676 m), weight 138 lb 3.2 oz (62.687 kg), SpO2 96 %. General: Well developed, well nourished male in no acute distress. Head: Normocephalic, atraumatic, sclera non-icteric, no xanthomas, nares are without discharge. EENT: normal  Lymph Nodes:  none Neck: Negative for carotid bruits. JVD not elevated. Back:without scoliosis kyphosis Lungs: Clear bilaterally to auscultation without wheezes, rales, or rhonchi. Breathing is unlabored. Heart: RRR with S1 S2. No murmur . No rubs, or gallops appreciated. Abdomen: Soft, non-tender, non-distended with normoactive bowel sounds. No hepatomegaly. No rebound/guarding. No obvious abdominal masses. Msk:  Strength and tone appear normal for age. Extremities: No clubbing or cyanosis. No*  edema.  Distal pedal pulses are 2+ and equal bilaterally. Skin: Warm and Dry Neuro: Alert and oriented X 3. CN III-XII intact Grossly normal sensory and motor function . Psych:  Responds to questions appropriately with a normal affect.      Labs: Cardiac Enzymes No results for input(s): CKTOTAL, CKMB, TROPONINI in the last 72 hours. CBC Lab Results  Component Value Date   WBC 9.8 09/30/2015   HGB 14.8 09/30/2015   HCT 43.6 09/30/2015   MCV 91.8 09/30/2015   PLT 281.0 09/30/2015   PROTIME: No results for input(s): LABPROT, INR  in the last 72 hours. Chemistry   Recent Labs Lab 12/20/15 1600  CREATININE 0.72   Lipids Lab Results  Component Value Date   CHOL 142 08/07/2015   HDL 45.70 08/07/2015   LDLCALC 70 08/07/2015   TRIG 133.0 08/07/2015   BNP No results found for: PROBNP Thyroid Function Tests: No results for input(s): TSH, T4TOTAL, T3FREE, THYROIDAB in the last 72 hours.  Invalid input(s): FREET3 Miscellaneous No results found for: DDIMER  Radiology/Studies:  Mr Virgel Paling Wo Contrast  12/20/2015  CLINICAL DATA:  History of left MCA territory infarct. EXAM: MRA NECK WITHOUT AND WITH CONTRAST MRA HEAD WITHOUT CONTRAST TECHNIQUE: Multiplanar and multiecho pulse sequences of the neck were obtained without and with intravenous contrast. Angiographic images of the neck were obtained using MRA technique without and with intravenous contast.; Angiographic images of the Circle of Willis were obtained using MRA technique without intravenous contrast. CONTRAST:  43mL MULTIHANCE GADOBENATE DIMEGLUMINE 529 MG/ML IV SOLN COMPARISON:  Brain MRI 10/18/2015 FINDINGS: MRA NECK FINDINGS There is mild narrowing of the proximal right internal carotid artery without hemodynamically significant stenosis. The left carotid system is normal. There is moderate narrowing of the right vertebral artery origin. Otherwise, the vertebral arteries are normal. MRA HEAD FINDINGS Intracranial internal carotid arteries: Normal. Anterior cerebral arteries: Congenitally hypoplastic right A1 segment. Otherwise normal anterior cerebral arteries. Middle cerebral arteries: Normal. Posterior communicating arteries: Present bilaterally. Posterior cerebral arteries: Bilateral fetal predominant origins. Basilar artery: Normal. Vertebral arteries: Codominant. Normal. Superior cerebellar arteries: Normal. Anterior inferior cerebellar arteries: Normal. Posterior inferior cerebellar arteries: Normal. IMPRESSION: 1. Normal MRA of the head. 2. Moderate narrowing  of the right vertebral artery origin. 3. Mild atherosclerotic narrowing of the proximal right internal carotid artery without hemodynamically significant stenosis. Electronically Signed   By: Ulyses Jarred M.D.   On: 12/20/2015 19:10   Mr Angiogram Neck W Wo Contrast  12/20/2015  CLINICAL DATA:  History of left MCA territory infarct. EXAM: MRA NECK WITHOUT AND WITH CONTRAST MRA HEAD WITHOUT CONTRAST TECHNIQUE: Multiplanar and multiecho pulse sequences of the neck were obtained without and with intravenous contrast. Angiographic images of the neck were obtained using MRA technique without and with intravenous contast.; Angiographic images of the  Circle of Willis were obtained using MRA technique without intravenous contrast. CONTRAST:  32mL MULTIHANCE GADOBENATE DIMEGLUMINE 529 MG/ML IV SOLN COMPARISON:  Brain MRI 10/18/2015 FINDINGS: MRA NECK FINDINGS There is mild narrowing of the proximal right internal carotid artery without hemodynamically significant stenosis. The left carotid system is normal. There is moderate narrowing of the right vertebral artery origin. Otherwise, the vertebral arteries are normal. MRA HEAD FINDINGS Intracranial internal carotid arteries: Normal. Anterior cerebral arteries: Congenitally hypoplastic right A1 segment. Otherwise normal anterior cerebral arteries. Middle cerebral arteries: Normal. Posterior communicating arteries: Present bilaterally. Posterior cerebral arteries: Bilateral fetal predominant origins. Basilar artery: Normal. Vertebral arteries: Codominant. Normal. Superior cerebellar arteries: Normal. Anterior inferior cerebellar arteries: Normal. Posterior inferior cerebellar arteries: Normal. IMPRESSION: 1. Normal MRA of the head. 2. Moderate narrowing of the right vertebral artery origin. 3. Mild atherosclerotic narrowing of the proximal right internal carotid artery without hemodynamically significant stenosis. Electronically Signed   By: Ulyses Jarred M.D.   On:  12/20/2015 19:10    EKG: SR 15/07/35   Assessment and Plan:   Cryptogenic stroke  Leg pain w exertion-- ABIs normal   Chest pain exertional  myoview neg 2014  Premature atrial and ventricular beats  Fatigue    The patient had a cryptogenic stroke identified in May.  He is not on antiplatlet therapy and I have reached out ot Dr Tomi Likens in this regard.  He is appropirately considered for implantable monitor to look for atrial fib as a cause of Cryptogenic Stroke Have reviewed risks and benefits  He has symptoms of angina and claudication; however, relatively recent evaluation of coronary artery perfusion and very recent ABIs are normal. We'll defer further evaluation to Dr.   He has significant fatigue. His stress level is enormous. He has early a.m. awakening. I wonder whether there isn't a component of depression. I have suggested that he follow-up with his primary care physician regarding this.  In addition, it is not at all clear to me that his ectopy is a cause of his fatigue, as he has no associated palpitations. At this point I would not pursue further therapy   Virl Axe

## 2016-01-13 NOTE — Telephone Encounter (Signed)
-----   Message from Pieter Partridge, DO sent at 01/13/2016  7:11 AM EDT ----- MRI does not show anything structural that would cause numbness in feet.  I think the numbness in feet is probably residual from prior nerve injury from before the back surgery.  There isn't anything new I can find or that is correctable.

## 2016-01-13 NOTE — Telephone Encounter (Signed)
Message left with wife. Pt is having a procedure done today at the hospital. Will have him call back at his earliest convenience.

## 2016-01-13 NOTE — Interval H&P Note (Signed)
History and Physical Interval Note:  01/13/2016 1:47 PM  [name]  has presented today for surgery, with the diagnosis of cryptogenic stroke  The various methods of treatment have been discussed with the patient and family. After consideration of risks, benefits and other options for treatment, the patient has consented to  Procedure(s): Loop Recorder Insertion (N/A) as a surgical intervention .  The patient's history has been reviewed, patient examined, no change in status, stable for surgery.  I have reviewed the patient's chart and labs.  Questions were answered to the patient's satisfaction.     Virl Axe

## 2016-01-14 ENCOUNTER — Telehealth: Payer: Self-pay | Admitting: Cardiology

## 2016-01-14 ENCOUNTER — Encounter (HOSPITAL_COMMUNITY): Payer: Self-pay | Admitting: Internal Medicine

## 2016-01-14 NOTE — Telephone Encounter (Signed)
Pt called and stated that he had a ILR implanted on Monday 01-13-16. He said he got cold and put a sweater on. When he went to bed he removed the sweater and his t-shirt was covered in blood. He said he applied pressure and the bleeding stopped. He wanted to know if he is ok? Call transferred to device tech RN.

## 2016-01-14 NOTE — Telephone Encounter (Signed)
[name] calling about bleeding from ILR implant site last night. He says he held pressure for 30 mins and the bleeding stopped. There hasn't been any bleeding overnight or today. I have advised him to remove old bandage and clean incision and place a clean bandaid over the incision. He verbalizes understanding. ROV with device clinic for wound check 01/22/16.  Walked through manual transmission process with [employer] monitor.

## 2016-01-20 ENCOUNTER — Encounter: Payer: Self-pay | Admitting: Internal Medicine

## 2016-01-20 ENCOUNTER — Ambulatory Visit (INDEPENDENT_AMBULATORY_CARE_PROVIDER_SITE_OTHER): Payer: Medicare HMO | Admitting: *Deleted

## 2016-01-20 DIAGNOSIS — I639 Cerebral infarction, unspecified: Secondary | ICD-10-CM

## 2016-01-20 DIAGNOSIS — Z95818 Presence of other cardiac implants and grafts: Secondary | ICD-10-CM

## 2016-01-20 LAB — CUP PACEART INCLINIC DEVICE CHECK: MDC IDC SESS DTM: 20170814163629

## 2016-01-20 NOTE — Progress Notes (Signed)
ILR wound check appointment. Steri-strips previously removed by patient. Wound without redness or edema. Ecchmosis over device site, fading to purplish-yellow. Incision edges approximated, wound well healed. Battery status: good. R-waves 0.34mV. No symptom, tachy, brady, or AF episodes. 1 pause episode--from implant, false, signal dropout. Pause and brady detection turned off (no hx of syncope). Monthly summary reports and ROV with [facility] in 12 months.

## 2016-01-20 NOTE — Patient Instructions (Signed)
Your physician wants you to follow-up in: 1 year with Dr Klein.  You will receive a reminder letter in the mail two months in advance. If you don't receive a letter, please call our office to schedule the follow-up appointment.  

## 2016-01-22 ENCOUNTER — Ambulatory Visit: Payer: Medicare HMO

## 2016-01-29 ENCOUNTER — Ambulatory Visit (INDEPENDENT_AMBULATORY_CARE_PROVIDER_SITE_OTHER): Payer: Medicare HMO | Admitting: Emergency Medicine

## 2016-01-29 ENCOUNTER — Ambulatory Visit (INDEPENDENT_AMBULATORY_CARE_PROVIDER_SITE_OTHER): Payer: Medicare HMO

## 2016-01-29 ENCOUNTER — Encounter: Payer: Self-pay | Admitting: Emergency Medicine

## 2016-01-29 ENCOUNTER — Ambulatory Visit: Payer: Medicare HMO

## 2016-01-29 VITALS — BP 160/72 | Wt 136.0 lb

## 2016-01-29 DIAGNOSIS — M542 Cervicalgia: Secondary | ICD-10-CM

## 2016-01-29 DIAGNOSIS — S60222A Contusion of left hand, initial encounter: Secondary | ICD-10-CM | POA: Diagnosis not present

## 2016-01-29 DIAGNOSIS — S8002XA Contusion of left knee, initial encounter: Secondary | ICD-10-CM | POA: Diagnosis not present

## 2016-01-29 DIAGNOSIS — S5002XA Contusion of left elbow, initial encounter: Secondary | ICD-10-CM | POA: Diagnosis not present

## 2016-01-29 DIAGNOSIS — S8012XA Contusion of left lower leg, initial encounter: Secondary | ICD-10-CM

## 2016-01-29 DIAGNOSIS — S01501A Unspecified open wound of lip, initial encounter: Secondary | ICD-10-CM | POA: Diagnosis not present

## 2016-01-29 DIAGNOSIS — S46912A Strain of unspecified muscle, fascia and tendon at shoulder and upper arm level, left arm, initial encounter: Secondary | ICD-10-CM | POA: Diagnosis not present

## 2016-01-29 DIAGNOSIS — S62309B Unspecified fracture of unspecified metacarpal bone, initial encounter for open fracture: Secondary | ICD-10-CM

## 2016-01-29 DIAGNOSIS — S46911A Strain of unspecified muscle, fascia and tendon at shoulder and upper arm level, right arm, initial encounter: Secondary | ICD-10-CM

## 2016-01-29 NOTE — Progress Notes (Signed)
Left ulnar gutter applied per Dr. Perfecto Kingdom VO.  Philis Fendt, MS, PA-C 1:50 PM, 01/29/2016

## 2016-01-29 NOTE — Patient Instructions (Signed)
     IF you received an x-ray today, you will receive an invoice from [facility]. Please contact  Radiology at [phone] with questions or concerns regarding your invoice.   IF you received labwork today, you will receive an invoice from [facility]. Please contact [facility] at [phone] with questions or concerns regarding your invoice.   Our billing staff will not be able to assist you with questions regarding bills from these companies.  You will be contacted with the lab results as soon as they are available. The fastest way to get your results is to activate your My Chart account. Instructions are located on the last page of this paperwork. If you have not heard from us regarding the results in 2 weeks, please contact this office.      

## 2016-01-29 NOTE — Progress Notes (Signed)
By signing my name below, I, Moises Blood, attest that this documentation has been prepared under the direction and in the presence of Arlyss Queen, MD. Electronically Signed: Moises Blood, [place]. 01/29/2016 , 12:34 PM .  Patient was seen in room 3 .  Chief Complaint:  Chief Complaint  Patient presents with  . Fall  . Arm Pain    L arm  . Shoulder Pain    Bilateral    HPI: [name] is a 75 y.o. male who reports to [facility] today complaining of left arm pain and bilateral shoulder pain due to a fall which occurred today at 10:00AM. He was walking his dog and as he was walking up a small flight of stairs, his left foot got caught and tripped forward. He hit his face and his left arm on concrete. He denies having similar stroke-like occurrence today. He denies any broken teeth. He denies breaking his glasses.   He notes having bad rotator cuffs and receives injections about every 3 months. He is followed by Dr. Percell Miller. His Tdap is up to date.   Immunization History  Administered Date(s) Administered  . Tdap 12/20/2014   He has a history of a stroke over left cerebral artery, seen on 01/13/16. He bruises easily and takes aspirin 81mg .   Past Medical History:  Diagnosis Date  . Adenoma of left adrenal gland    stable per ct  . Coronary artery disease CARDIOLOGIST-- DR Rayann Heman   NON-OBSTRUCTIVE CAD--- 2005 Cath with 30% LAD, calcification/plaque of LAD, EF 60%  . DDD (degenerative disc disease), lumbosacral   . Depression   . Diverticulosis, sigmoid   . Eczema   . ED (erectile dysfunction)   . Encopresis   . Frequency of urination   . GERD (gastroesophageal reflux disease)   . Hemorrhoids 2002  . History of duodenal ulcer   . History of esophageal dilatation    FOR STRICTURE 2008  . History of gallstones   . History of kidney stones 2014   right ureteral lithotripsy 01/2013  . History of peptic ulcer   . History of prostate cancer    S/P RADICAL PROSTATECTOMY 2009  .  Hyperlipidemia   . Hypertension   . IBS (irritable bowel syndrome)   . Ischemic colitis ([place])   . Nocturia   . OA (osteoarthritis)    RIGHT SHOULDER/ HIPS  . PAC (premature atrial contraction)   . Peyronie's disease   . Right ureteral stone   . 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 SUI (stress urinary incontinence), male   . Wears glasses    Past Surgical History:  Procedure Laterality Date  . CARDIAC CATHETERIZATION  07-23-2003   Non-obstructive CAD/  30% narrowing ostium LM,  30-40% eccentric calcified plaque LAD, 20% narrowing RCA/  preserved LVF  . CARDIOVASCULAR STRESS TEST  12-23-2012  DR ALLRED   Low risk lexiscan study with small mild intensity, fixed inferior defect consistent with thinning, no ischemia/  normal LVF and wall motion, ef 66%  . COLONOSCOPY  last one 06-19-2011  . CYSTOSCOPY W/ RETROGRADES Right 07/19/2014   Procedure: CYSTOSCOPY WITH RETROGRADE PYELOGRAM;  Surgeon: Malka So, MD;  Location: [facility];  Service: Urology;  Laterality: Right;  . CYSTOSCOPY/RETROGRADE/URETEROSCOPY/STONE EXTRACTION WITH BASKET Right 07/19/2014   Procedure: RIGHT URETEROSCOPY/STONE EXTRACTION WITH BASKET;  Surgeon: Malka So, MD;  Location[facility];  Service: Urology;  Laterality: Right;  . EP IMPLANTABLE DEVICE  N/A 01/13/2016   Procedure: Loop Recorder Insertion;  Surgeon: Deboraha Sprang, MD;  Location: [facility];  Service: Cardiovascular;  Laterality: N/A;  . ESOPHAGOGASTRODUODENOSCOPY (EGD) WITH ESOPHAGEAL DILATION  02-04-2001  . EXTRACORPOREAL SHOCK WAVE LITHOTRIPSY Right 01-30-2013  . FLEXIBLE SIGMOIDOSCOPY N/A 03/19/2014   Procedure: FLEXIBLE SIGMOIDOSCOPY;  Surgeon: Inda Castle, MD;  Location: [facility];  Service: Endoscopy;  Laterality: N/A;  . INGUINAL HERNIA REPAIR Bilateral 03-17-2010  . LOOP RECORDER IMPLANT    . LUMBAR FUSION  03-26-2005   Revision laminectomy L5 - S1,  Laminectomy and Decompression  L4-L5, fusion L4 -- S1  . LUMBAR LAMINECTOMY  1999   L5  -- S1  . NASAL SEPTUM SURGERY  YRS AGO  . RADICAL PROSTATECTOMY/ BILATERAL RETROPERITONEAL LYMPH NODE DISSECTION  12-29-2007  . REMOVAL BENIGN RIGHT EYELID LESION  12-19-2007  . SLING CYSTOURETHROPEXY (AdVANCE)  10-12-2009  . TONSILLECTOMY AND ADENOIDECTOMY  as child  . TRANSTHORACIC ECHOCARDIOGRAM  07-13-2014   mild LVH/  ef 60-655,  grade I diastolic dysfunction/  trivial AR, MR and TR/  mild LAE   Social History   Social History  . Marital status: Married    Spouse name: N/A  . Number of children: 2  . Years of education: N/A   Occupational History  . Retired Retired   Social History Main Topics  . Smoking status: Current Every Day Smoker    Packs/day: 1.00    Years: 53.00    Types: Cigarettes  . Smokeless tobacco: Never Used  . Alcohol use No  . Drug use: No  . Sexual activity: Not Asked   Other Topics Concern  . None   Social History Narrative  . None   Family History  Problem Relation Age of Onset  . Heart attack Father 72  . Aneurysm Father   . Bone cancer Maternal Grandfather   . Colon cancer Maternal Aunt 91  . Lymphoma Maternal Aunt    Allergies  Allergen Reactions  . Pseudoephedrine Palpitations and Other (See Comments)    "Heart races"  . Tape Itching and Other (See Comments)    tears skin, can use paper tape  . Sulfa Antibiotics Itching  . Sulfacetamide Sodium Itching  . Lodine [Etodolac] Diarrhea  . Oxycodone Other (See Comments)    "over sedates"   Prior to Admission medications   Medication Sig Start Date End Date Taking? Authorizing Provider  bimatoprost (LUMIGAN) 0.01 % SOLN Place 1 drop into both eyes daily.   Yes Historical Provider, MD  dicyclomine (BENTYL) 10 MG capsule Take one capsule by mouth before meals three times a day Patient taking differently: Take 10 mg by mouth 3 (three) times daily before meals.  07/24/15  Yes Janith Lima, MD  diltiazem (TIAZAC) 360 MG 24 hr  capsule Take 1 capsule (360 mg total) by mouth daily. 07/05/15  Yes Larey Dresser, MD  Doxepin HCl (SILENOR) 3 MG TABS Take 1 tablet (3 mg total) by mouth at bedtime as needed. Patient taking differently: Take 1 tablet by mouth at bedtime as needed (for sleep).  12/19/15  Yes Janith Lima, MD  gabapentin (NEURONTIN) 100 MG capsule Take 200 mg by mouth at bedtime.   Yes Historical Provider, MD  HYDROcodone-acetaminophen (NORCO/VICODIN) 5-325 MG tablet Take 1 tablet by mouth every 6 (six) hours as needed for moderate pain.   Yes Historical Provider, MD  metoprolol succinate (TOPROL-XL) 25 MG 24 hr tablet Take 1 tablet (25 mg total) by  mouth 2 (two) times daily. 07/25/15  Yes Larey Dresser, MD  Multiple Vitamin (MULTIVITAMIN WITH MINERALS) TABS tablet Take 1 tablet by mouth daily.   Yes Historical Provider, MD  omeprazole (PRILOSEC) 20 MG capsule TAKE ONE CAPSULE BY MOUTH TWICE DAILY Patient taking differently: TAKE 20 MG BY MOUTH TWICE DAILY 11/06/15  Yes Janith Lima, MD  pravastatin (PRAVACHOL) 40 MG tablet Take 1 tablet (40 mg total) by mouth every evening. 07/25/15  Yes Larey Dresser, MD  topiramate (TOPAMAX) 25 MG tablet Take 25 mg by mouth daily as needed (for headaches). Reported on 12/11/2015   Yes Historical Provider, MD  umeclidinium-vilanterol (ANORO ELLIPTA) 62.5-25 MCG/INH AEPB Inhale 1 puff into the lungs daily. 10/08/15  Yes Janith Lima, MD     ROS:  Constitutional: negative for fever, chills, night sweats, weight changes, or fatigue  HEENT: negative for vision changes, hearing loss, congestion, rhinorrhea, ST, epistaxis, or sinus pressure Cardiovascular: negative for chest pain or palpitations Respiratory: negative for hemoptysis, wheezing, shortness of breath, or cough Abdominal: negative for abdominal pain, nausea, vomiting, diarrhea, or constipation Dermatological: negative for rash; positive for wound Musc: positive for myalgia, arthralgia Neurologic: negative for  headache, dizziness, or syncope All other systems reviewed and are otherwise negative with the exception to those above and in the HPI.  PHYSICAL EXAM: Vitals:   01/29/16 1151  BP: (!) 160/72   Body mass index is 21.95 kg/m.   General: Alert, no acute distress HEENT:  Normocephalic, atraumatic, oropharynx patent, Bruising over his chin, 2cm laceration through left lower lip Eye: EOMI, PEERLDC Neck: neck is decreased ROM secondary to paracervical pain Cardiovascular:  Regular rate and rhythm, no rubs murmurs or gallops.  No Carotid bruits, radial pulse intact. No pedal edema.  Respiratory: Clear to auscultation bilaterally.  No wheezes, rales, or rhonchi.  No cyanosis, no use of accessory musculature Abdominal: No organomegaly, abdomen is soft and non-tender, positive bowel sounds. No masses. Musculoskeletal: significant swelling over his left hand and abrasion over left elbow, abrasion over left knee Skin: No rashes. Neurologic: Facial musculature symmetric. Psychiatric: Patient acts appropriately throughout our interaction.  Lymphatic: No cervical or submandibular lymphadenopathy Genitourinary/Anorectal: No acute findings  LABS:   EKG/XRAY:   Dg Cervical Spine Complete  Result Date: 01/29/2016 CLINICAL DATA:  Neck pain, status post fall at home. EXAM: CERVICAL SPINE - COMPLETE 4+ VIEW COMPARISON:  None. FINDINGS: Disc space narrowing at C5-6 and C6-7. Mild right neural foraminal narrowing due to uncovertebral spurring from C3-4 through C6-7. Degenerative facet disease bilaterally, right greater than left. No fracture. Normal alignment. Prevertebral soft tissues are normal. IMPRESSION: Degenerative changes.  No acute findings. Electronically Signed   By: Rolm Baptise M.D.   On: 01/29/2016 13:15   Dg Shoulder Right  Result Date: 01/29/2016 CLINICAL DATA:  Right shoulder pain following a fall at home EXAM: RIGHT SHOULDER - 2+ VIEW COMPARISON:  Chest x-ray of August 07, 2014 which  included the right shoulder. FINDINGS: The bones are subjectively osteopenic. There is marked narrowing of the subacromial subdeltoid space which is chronic. There is superior migration of the humeral head with respect to the bony glenoid. There are osteophytes arising from the articular margins of the glenoid as well as from the under surface of the distal clavicle. No acute fracture nor dislocation is observed. There is mild narrowing of the AC joint space. The observed portions of the upper right ribs are normal. IMPRESSION: No acute fracture or dislocation of the  right shoulder. Stable degenerative changes as described. Electronically Signed   By: David  Martinique M.D.   On: 01/29/2016 13:17   Dg Elbow Complete Left (3+view)  Result Date: 01/29/2016 CLINICAL DATA:  Fall at home.  Elbow pain EXAM: LEFT ELBOW - COMPLETE 3+ VIEW COMPARISON:  None. FINDINGS: No acute bony abnormality. Specifically, no fracture, subluxation, or dislocation. Soft tissues are intact. No joint effusion. IMPRESSION: No acute bony abnormality. Electronically Signed   By: Rolm Baptise M.D.   On: 01/29/2016 13:16   Dg Knee 1-2 Views Left  Result Date: 01/29/2016 CLINICAL DATA:  Status post fall at home. Patient reports left knee pain. EXAM: LEFT KNEE - 1-2 VIEW COMPARISON:  None in PACs FINDINGS: The bones are subjectively osteopenic. The joint spaces are well maintained. There is no acute fracture nor dislocation. There is no joint effusion. The prepatellar soft tissues are normal. There is calcification in the wall of the distal superficial femoral artery as well as in the popliteal artery. IMPRESSION: There is no acute or significant chronic bony abnormality of the left knee. Electronically Signed   By: David  [place]ique M.D.   On: 01/29/2016 13:15   Dg Hand 2 View Left  Result Date: 01/29/2016 CLINICAL DATA:  Hand pain post fall at home EXAM: LEFT HAND - 2 VIEW COMPARISON:  None. FINDINGS: There is a fracture through the proximal  aspect of the left fifth metacarpal. Slight displacement. No additional fracture. No subluxation or dislocation. Degenerative changes in the IP joints and first carpometacarpal joint. IMPRESSION: Slightly displaced fracture at the base of the left fifth metacarpal. Electronically Signed   By: Rolm Baptise M.D.   On: 01/29/2016 13:17   Dg Shoulder Left  Result Date: 01/29/2016 CLINICAL DATA:  Shoulder pain post fall at home EXAM: LEFT SHOULDER - 2+ VIEW COMPARISON:  Chest x-ray 08/07/2014 FINDINGS: Mild joint space narrowing in the left AC and glenohumeral joints. No acute bony abnormality. Specifically, no fracture, subluxation, or dislocation. Soft tissues are intact. IMPRESSION: No acute bony abnormality. Electronically Signed   By: Rolm Baptise M.D.   On: 01/29/2016 13:18     ASSESSMENT/PLAN: Patient sustained multiple injuries. He has a laceration of his lower lip and abrasions on the face with bruising of his chin. He has contusions to both shoulders and his left elbow and left knee. He has a fracture at the base of the left fifth metacarpal. Splint was placed for this and referral made to [facility].I personally performed the services described in this documentation, which was scribed in my presence. The recorded information has been reviewed and is accurate.  Gross sideeffects, risk and benefits, and alternatives of medications d/w patient. Patient is aware that all medications have potential sideeffects and we are unable to predict every sideeffect or drug-drug interaction that may occur.  Arlyss Queen MD 01/29/2016 12:22 PM

## 2016-02-11 ENCOUNTER — Telehealth: Payer: Self-pay | Admitting: Internal Medicine

## 2016-02-11 NOTE — Telephone Encounter (Signed)
Patient Name: [name]  DOB: [birth year]    Initial Comment Caller states missed callback from nurse; fell two weeks ago breaking his hand. Was put onABX and just finished it yesterday. had blood inurine yesterday. Wants to know ABX caused it.   Nurse Assessment  Nurse: Mallie Mussel, RN, Alveta Heimlich Date/Time Eilene Ghazi Time): 02/11/2016 2:31:24 PM  Confirm and document reason for call. If symptomatic, describe symptoms. You must click the next button to save text entered. ---Caller states that he had blood in his urine yesterday. It is clear today. He was on Cephalexin and finished it yesterday. Per [employer]'s site, this could be a side effect of the medication. He does not have symptoms at this time. https://www.drugs.com/sfx/cephalexin-side-effects.html  Has the patient traveled out of the country within the last 30 days? ---Not Applicable  Does the patient have any new or worsening symptoms? ---No     Guidelines    Guideline Title Affirmed Question Affirmed Notes       Final Disposition User   Clinical Call Mallie Mussel, RN, Alveta Heimlich

## 2016-02-11 NOTE — Telephone Encounter (Signed)
I don't think an antibiotic would do this

## 2016-02-11 NOTE — Telephone Encounter (Signed)
See next phone note. [employer] Note added.

## 2016-02-11 NOTE — Telephone Encounter (Signed)
Before I contact pt: would abx cause blood in urine.  Will schedule OV if agreed.

## 2016-02-11 NOTE — Telephone Encounter (Signed)
Patient Name: [name] DOB: November 29, 1940 Initial Comment fell two weeks ago breaking his hand. Was put on ABX and just finished it yesterday. had blood in urine yesterday. Wants to know ABX caused it. Nurse Assessment Guidelines Guideline Title Affirmed Question Affirmed Notes Final Disposition User FINAL ATTEMPT MADE - message left Ronnald Ramp, Therapist, sports, [employer]

## 2016-02-12 ENCOUNTER — Ambulatory Visit (INDEPENDENT_AMBULATORY_CARE_PROVIDER_SITE_OTHER): Payer: Medicare HMO | Admitting: *Deleted

## 2016-02-12 DIAGNOSIS — I639 Cerebral infarction, unspecified: Secondary | ICD-10-CM | POA: Diagnosis not present

## 2016-02-14 NOTE — Progress Notes (Signed)
Carelink Summary Report / Loop Recorder 

## 2016-03-13 ENCOUNTER — Ambulatory Visit (INDEPENDENT_AMBULATORY_CARE_PROVIDER_SITE_OTHER): Payer: Medicare HMO | Admitting: *Deleted

## 2016-03-13 DIAGNOSIS — I639 Cerebral infarction, unspecified: Secondary | ICD-10-CM | POA: Diagnosis not present

## 2016-03-14 LAB — CUP PACEART REMOTE DEVICE CHECK: Date Time Interrogation Session: 20170906214101

## 2016-03-14 NOTE — Progress Notes (Signed)
Carelink summary report received. Battery status OK. Normal device function. No new symptom episodes, tachy episodes, brady episodes. No new AF episodes. 1 pause episode, EMD. Monthly summary reports and ROV/PRN

## 2016-03-16 NOTE — Progress Notes (Signed)
Carelink Summary Report / Loop Recorder 

## 2016-03-18 ENCOUNTER — Encounter: Payer: Self-pay | Admitting: Neurology

## 2016-03-18 ENCOUNTER — Ambulatory Visit (INDEPENDENT_AMBULATORY_CARE_PROVIDER_SITE_OTHER): Payer: Medicare HMO | Admitting: Neurology

## 2016-03-18 VITALS — BP 150/64 | HR 91 | Ht 66.0 in | Wt 139.0 lb

## 2016-03-18 DIAGNOSIS — I63312 Cerebral infarction due to thrombosis of left middle cerebral artery: Secondary | ICD-10-CM

## 2016-03-18 DIAGNOSIS — E785 Hyperlipidemia, unspecified: Secondary | ICD-10-CM

## 2016-03-18 DIAGNOSIS — M5417 Radiculopathy, lumbosacral region: Secondary | ICD-10-CM | POA: Diagnosis not present

## 2016-03-18 DIAGNOSIS — F17200 Nicotine dependence, unspecified, uncomplicated: Secondary | ICD-10-CM

## 2016-03-18 DIAGNOSIS — I10 Essential (primary) hypertension: Secondary | ICD-10-CM

## 2016-03-18 MED ORDER — GABAPENTIN 100 MG PO CAPS: 200.0000 mg | ORAL_CAPSULE | Freq: Two times a day (BID) | ORAL | 5 refills | Status: DC

## 2016-03-18 NOTE — Progress Notes (Signed)
NEUROLOGY FOLLOW UP OFFICE NOTE  [name] [id number]  HISTORY OF PRESENT ILLNESS: [name] is a 75 year old right-handed man with hypertension, hyperlipidemia, nonobstructive CAD, palpitations and GERD and history of prostate cancer who follows up for chronic headache, numbness in feet and left MCA stroke.  He is accompanied by his wife who supplements history.  UPDATE: Headache: He is taking gabapentin 100mg  to 200mg  at bedtime. Headaches are improved, now only 2 to 3 days per week. He takes Excedrin, which helps.  He also takes hydrocodone for shoulder and back pain.  Stroke: MRA of head and neck from 12/20/15 showed only moderate narrowing of the right vertebral artery origin but otherwise no significant intracranial or extracranial stenosis or occlusion.  2D echo showed LVEF 55-60% without cardiac source of emboli.  He had a loop recorder implanted.  So far, no atrial fibrillation.  He still has not been taking ASA daily.  Numbness in feet: NCV-EMG of the lower extremities from 01/01/16 showed bilateral multilevel radiculopathies affecting the L4-S1 nerve roots but no evidence for a generalized sensorimotor polyneuropathy.  MRI of lumbar spine from 01/11/16 was personally reviewed and revealed postsurgical changes but no no canal stenosis.    HISTORY: He has longstanding history of headaches, which are over his crown, and non-throbbing.  It is constant.  There is no associated nausea, visual disturbance, photophobia or phonophobia.  He takes Excedrin daily for many years.  He also takes hydrocodone for shoulder pain.  He also has occasional episodes of lightheadedness and feeling unsteady.     To further assess this, MRI was performed.   MRI of brain without contrast from 10/08/15 and with and without contrast on 10/18/15 were personally reviewed and revealed a subacute left MCA territory infarction.   He has history of two back surgeries.  He reports numbness on the bottom of both  feet as well as weakness in the legs.  He has not had falls.  He has some pain in the calves but no back pain.  Vascular studies reportedly negative.  MRI of lumbar spine from 2013 showed postsurgical changes as well as disc protrusion impinging the left L4 nerve root.  B12 was 649, folate over 23.7 and TSH 1.26.    Lipid panel from 08/07/15 showed LDL of 70. Serum glucose has been in the 90s.   He had a 30-day event monitor to assess palpitations.  He only wore it for a week due to discomfort.  It revealed PVCs/PACs but did not reveal atrial fibrillation.  PAST MEDICAL HISTORY: Past Medical History:  Diagnosis Date  . Adenoma of left adrenal gland    stable per ct  . Coronary artery disease CARDIOLOGIST-- DR Rayann Heman   NON-OBSTRUCTIVE CAD--- 2005 Cath with 30% LAD, calcification/plaque of LAD, EF 60%  . DDD (degenerative disc disease), lumbosacral   . Depression   . Diverticulosis, sigmoid   . Eczema   . ED (erectile dysfunction)   . Encopresis   . Frequency of urination   . GERD (gastroesophageal reflux disease)   . Hemorrhoids 2002  . History of duodenal ulcer   . History of esophageal dilatation    FOR STRICTURE 2008  . History of gallstones   . History of kidney stones 2014   right ureteral lithotripsy 01/2013  . History of peptic ulcer   . History of prostate cancer    S/P RADICAL PROSTATECTOMY 2009  . Hyperlipidemia   . Hypertension   . IBS (irritable bowel  syndrome)   . Ischemic colitis ([place])   . Nocturia   . OA (osteoarthritis)    RIGHT SHOULDER/ HIPS  . PAC (premature atrial contraction)   . Peyronie's disease   . Right ureteral stone   . 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 SUI (stress urinary incontinence), male   . Wears glasses     MEDICATIONS: Current Outpatient Prescriptions on File Prior to Visit  Medication Sig Dispense Refill  . bimatoprost (LUMIGAN) 0.01 % SOLN Place 1 drop into both eyes daily.    Marland Kitchen dicyclomine (BENTYL) 10 MG capsule  Take one capsule by mouth before meals three times a day (Patient taking differently: Take 10 mg by mouth 3 (three) times daily before meals. ) 270 capsule 1  . diltiazem (TIAZAC) 360 MG 24 hr capsule Take 1 capsule (360 mg total) by mouth daily. 90 capsule 3  . Doxepin HCl (SILENOR) 3 MG TABS Take 1 tablet (3 mg total) by mouth at bedtime as needed. (Patient taking differently: Take 1 tablet by mouth at bedtime as needed (for sleep). ) 90 tablet 3  . HYDROcodone-acetaminophen (NORCO/VICODIN) 5-325 MG tablet Take 1 tablet by mouth every 6 (six) hours as needed for moderate pain.    . metoprolol succinate (TOPROL-XL) 25 MG 24 hr tablet Take 1 tablet (25 mg total) by mouth 2 (two) times daily. 180 tablet 3  . Multiple Vitamin (MULTIVITAMIN WITH MINERALS) TABS tablet Take 1 tablet by mouth daily.    Marland Kitchen omeprazole (PRILOSEC) 20 MG capsule TAKE ONE CAPSULE BY MOUTH TWICE DAILY (Patient taking differently: TAKE 20 MG BY MOUTH TWICE DAILY) 180 capsule 1  . pravastatin (PRAVACHOL) 40 MG tablet Take 1 tablet (40 mg total) by mouth every evening. 90 tablet 3  . topiramate (TOPAMAX) 25 MG tablet Take 25 mg by mouth daily as needed (for headaches). Reported on 12/11/2015    . umeclidinium-vilanterol (ANORO ELLIPTA) 62.5-25 MCG/INH AEPB Inhale 1 puff into the lungs daily. 30 each 11   No current facility-administered medications on file prior to visit.     ALLERGIES: Allergies  Allergen Reactions  . Pseudoephedrine Palpitations and Other (See Comments)    "Heart races"  . Tape Itching and Other (See Comments)    tears skin, can use paper tape  . Sulfa Antibiotics Itching  . Sulfacetamide Sodium Itching  . Lodine [Etodolac] Diarrhea  . Oxycodone Other (See Comments)    "over sedates"    FAMILY HISTORY: Family History  Problem Relation Age of Onset  . Heart attack Father 6  . Aneurysm Father   . Bone cancer Maternal Grandfather   . Colon cancer Maternal Aunt 60  . Lymphoma Maternal Aunt      SOCIAL HISTORY: Social History   Social History  . Marital status: Married    Spouse name: N/A  . Number of children: 2  . Years of education: N/A   Occupational History  . Retired Retired   Social History Main Topics  . Smoking status: Current Every Day Smoker    Packs/day: 1.00    Years: 53.00    Types: Cigarettes  . Smokeless tobacco: Never Used  . Alcohol use No  . Drug use: No  . Sexual activity: Not on file   Other Topics Concern  . Not on file   Social History Narrative  . No narrative on file    REVIEW OF SYSTEMS: Constitutional: No fevers, chills, or sweats, no generalized fatigue, change in appetite Eyes: No  visual changes, double vision, eye pain Ear, nose and throat: No hearing loss, ear pain, nasal congestion, sore throat Cardiovascular: No chest pain, palpitations Respiratory:  No shortness of breath at rest or with exertion, wheezes GastrointestinaI: No nausea, vomiting, diarrhea, abdominal pain, fecal incontinence Genitourinary:  No dysuria, urinary retention or frequency Musculoskeletal:  Shoulder pain, back pain Integumentary: No rash, pruritus, skin lesions Neurological: as above Psychiatric: No depression, insomnia, anxiety Endocrine: No palpitations, fatigue, diaphoresis, mood swings, change in appetite, change in weight, increased thirst Hematologic/Lymphatic:  No purpura, petechiae. Allergic/Immunologic: no itchy/runny eyes, nasal congestion, recent allergic reactions, rashes  PHYSICAL EXAM: Vitals:   03/18/16 1100  BP: (!) 150/64  Pulse: 91   General: No acute distress.  Patient appears well-groomed.  normal body habitus. Head:  Normocephalic/atraumatic Eyes:  Fundi examined but not visualized Neck: supple, no paraspinal tenderness, full range of motion Heart:  Regular rate and rhythm Lungs:  Clear to auscultation bilaterally Back: No paraspinal tenderness Neurological Exam: alert and oriented to person, place, and time.  Attention span and concentration intact, recent and remote memory intact, fund of knowledge intact.  Speech fluent and not dysarthric, language intact.  CN II-XII intact. Bulk and tone normal, muscle strength 5/5 throughout.  Sensation to light touch  intact.  Deep tendon reflexes 2+ throughout.  Finger to nose testing intact.  Gait normal  IMPRESSION: 1.  Left MCA stroke, subacute, likely incidental finding 2.  headache, likely medication-overuse 3.  Numbness in feet secondary to chronic lumbar radiculopathies 4.  HTN 5.  Hyperlipidemia 6.  Tobacco use  PLAN: 1.  Start ASA 81mg  daily. 2.  Continue statin therapy as managed by PCP (LDL goal should be less than 70) 3.  Optimize blood pressure control as per PCP.  Elevated today.  Should be rechecked with PCP. 4.  Discussed smoking cessation and Mediterranean diet 5.  Increase gabapentin to 200mg  twice daily 6.  Follow up in 6 months.  26 minutes spent face to face with patient, over 50% spent counseling.  Metta Clines, DO  CC:  Scarlette Calico, MD

## 2016-03-18 NOTE — Patient Instructions (Signed)
1.  Start taking aspirin 81mg  daily 2.  We will increase gabapentin 100mg  capsules to 2 capsules twice daily 3.  Limit use of Excedrin to no more than 2 days out of the week 4.  We already discussed quitting smoking 5.  Continue daily walks 6.  Mediterranean diet    Why follow it? Research shows. . Those who follow the Mediterranean diet have a reduced risk of heart disease  . The diet is associated with a reduced incidence of Parkinson's and Alzheimer's diseases . People following the diet may have longer life expectancies and lower rates of chronic diseases  . The Dietary Guidelines for Americans recommends the Mediterranean diet as an eating plan to promote health and prevent disease  What Is the Mediterranean Diet?  . Healthy eating plan based on typical foods and recipes of Mediterranean-style cooking . The diet is primarily a plant based diet; these foods should make up a majority of meals   Starches - Plant based foods should make up a majority of meals - They are an important sources of vitamins, minerals, energy, antioxidants, and fiber - Choose whole grains, foods high in fiber and minimally processed items  - Typical grain sources include wheat, oats, barley, corn, brown rice, bulgar, farro, millet, polenta, couscous  - Various types of beans include chickpeas, lentils, fava beans, black beans, white beans   Fruits  Veggies - Large quantities of antioxidant rich fruits & veggies; 6 or more servings  - Vegetables can be eaten raw or lightly drizzled with oil and cooked  - Vegetables common to the traditional Mediterranean Diet include: artichokes, arugula, beets, broccoli, brussel sprouts, cabbage, carrots, celery, collard greens, cucumbers, eggplant, kale, leeks, lemons, lettuce, mushrooms, okra, onions, peas, peppers, potatoes, pumpkin, radishes, rutabaga, shallots, spinach, sweet potatoes, turnips, zucchini - Fruits common to the Mediterranean Diet include: apples, apricots,  avocados, cherries, clementines, dates, figs, grapefruits, grapes, melons, nectarines, oranges, peaches, pears, pomegranates, strawberries, tangerines  Fats - Replace butter and margarine with healthy oils, such as olive oil, canola oil, and tahini  - Limit nuts to no more than a handful a day  - Nuts include walnuts, almonds, pecans, pistachios, pine nuts  - Limit or avoid candied, honey roasted or heavily salted nuts - Olives are central to the [employer] - can be eaten whole or used in a variety of dishes   Meats Protein - Limiting red meat: no more than a few times a month - When eating red meat: choose lean cuts and keep the portion to the size of deck of cards - Eggs: approx. 0 to 4 times a week  - Fish and lean poultry: at least 2 a week  - Healthy protein sources include, chicken, [place], lean beef, [place] - Increase intake of seafood such as tuna, salmon, trout, mackerel, shrimp, scallops - Avoid or limit high fat processed meats such as sausage and bacon  Dairy - Include moderate amounts of low fat dairy products  - Focus on healthy dairy such as fat free yogurt, skim milk, low or reduced fat cheese - Limit dairy products higher in fat such as whole or 2% milk, cheese, ice cream  Alcohol - Moderate amounts of red wine is ok  - No more than 5 oz daily for women (all ages) and men older than age 60  - No more than 10 oz of wine daily for men younger than 33  Other - Limit sweets and other desserts  - Use herbs and  spices instead of salt to flavor foods  - Herbs and spices common to the traditional Mediterranean Diet include: basil, bay leaves, chives, cloves, cumin, fennel, garlic, lavender, marjoram, mint, oregano, parsley, pepper, rosemary, sage, savory, sumac, tarragon, thyme   It's not just a diet, it's a lifestyle:  . The Mediterranean diet includes lifestyle factors typical of those in the region  . Foods, drinks and meals are best eaten with others and savored . Daily  physical activity is important for overall good health . This could be strenuous exercise like running and aerobics . This could also be more leisurely activities such as walking, housework, yard-work, or taking the stairs . Moderation is the key; a balanced and healthy diet accommodates most foods and drinks . Consider portion sizes and frequency of consumption of certain foods   Meal Ideas & Options:  . Breakfast:  o Whole wheat toast or whole wheat English muffins with peanut butter & hard boiled egg o Steel cut oats topped with apples & cinnamon and skim milk  o Fresh fruit: banana, strawberries, melon, berries, peaches  o Smoothies: strawberries, bananas, greek yogurt, peanut butter o Low fat greek yogurt with blueberries and granola  o Egg white omelet with spinach and mushrooms o Breakfast couscous: whole wheat couscous, apricots, skim milk, cranberries  . Sandwiches:  o Hummus and grilled vegetables (peppers, zucchini, squash) on whole wheat bread   o Grilled chicken on whole wheat pita with lettuce, tomatoes, cucumbers or tzatziki  o Tuna salad on whole wheat bread: tuna salad made with greek yogurt, olives, red peppers, capers, green onions o Garlic rosemary Romito pita: Adeyemi sauted with garlic, rosemary, salt & pepper; add lettuce, cucumber, greek yogurt to pita - flavor with lemon juice and black pepper  . Seafood:  o Mediterranean grilled salmon, seasoned with garlic, basil, parsley, lemon juice and black pepper o Shrimp, lemon, and spinach whole-grain pasta salad made with low fat greek yogurt  o Seared scallops with lemon orzo  o Seared tuna steaks seasoned salt, pepper, coriander topped with tomato mixture of olives, tomatoes, olive oil, minced garlic, parsley, green onions and cappers  . Meats:  o Herbed greek chicken salad with kalamata olives, cucumber, feta  o Red bell peppers stuffed with spinach, bulgur, lean ground beef (or lentils) & topped with feta   o Kebabs:  skewers of chicken, tomatoes, onions, zucchini, squash  o [place] burgers: made with red onions, mint, dill, lemon juice, feta cheese topped with roasted red peppers . Vegetarian o Cucumber salad: cucumbers, artichoke hearts, celery, red onion, feta cheese, tossed in olive oil & lemon juice  o Hummus and whole grain pita points with a greek salad (lettuce, tomato, feta, olives, cucumbers, red onion) o Lentil soup with celery, carrots made with vegetable broth, garlic, salt and pepper  o Tabouli salad: parsley, bulgur, mint, scallions, cucumbers, tomato, radishes, lemon juice, olive oil, salt and pepper. 7.  Continue blood pressure and cholesterol medication 8.  Follow up in 6 months.

## 2016-03-21 ENCOUNTER — Other Ambulatory Visit: Payer: Self-pay | Admitting: Internal Medicine

## 2016-04-13 ENCOUNTER — Encounter: Payer: Self-pay | Admitting: Internal Medicine

## 2016-04-13 ENCOUNTER — Ambulatory Visit (INDEPENDENT_AMBULATORY_CARE_PROVIDER_SITE_OTHER): Payer: Medicare HMO | Admitting: *Deleted

## 2016-04-13 ENCOUNTER — Ambulatory Visit (INDEPENDENT_AMBULATORY_CARE_PROVIDER_SITE_OTHER): Payer: Medicare HMO | Admitting: Internal Medicine

## 2016-04-13 VITALS — BP 140/82 | HR 74 | Temp 98.4°F | Resp 20 | Wt 138.0 lb

## 2016-04-13 DIAGNOSIS — F17200 Nicotine dependence, unspecified, uncomplicated: Secondary | ICD-10-CM

## 2016-04-13 DIAGNOSIS — I10 Essential (primary) hypertension: Secondary | ICD-10-CM | POA: Diagnosis not present

## 2016-04-13 DIAGNOSIS — I639 Cerebral infarction, unspecified: Secondary | ICD-10-CM

## 2016-04-13 DIAGNOSIS — J029 Acute pharyngitis, unspecified: Secondary | ICD-10-CM

## 2016-04-13 MED ORDER — AZITHROMYCIN 250 MG PO TABS: ORAL_TABLET | ORAL | 1 refills | Status: DC

## 2016-04-13 NOTE — Progress Notes (Signed)
Subjective:    Patient ID: [name], male    DOB: 06/20/1940, 75 y.o.   MRN: [id number]  HPI   Here with 2-3 days acute onset fever, severe ST pain, pressure, headache, general weakness and malaise, and greenish d/c, with mild ST and cough, but pt denies chest pain, wheezing, increased sob or doe, orthopnea, PND, increased LE swelling, palpitations, dizziness or syncope. Past Medical History:  Diagnosis Date  . Adenoma of left adrenal gland    stable per ct  . Coronary artery disease CARDIOLOGIST-- DR Rayann Heman   NON-OBSTRUCTIVE CAD--- 2005 Cath with 30% LAD, calcification/plaque of LAD, EF 60%  . DDD (degenerative disc disease), lumbosacral   . Depression   . Diverticulosis, sigmoid   . Eczema   . ED (erectile dysfunction)   . Encopresis   . Frequency of urination   . GERD (gastroesophageal reflux disease)   . Hemorrhoids 2002  . History of duodenal ulcer   . History of esophageal dilatation    FOR STRICTURE 2008  . History of gallstones   . History of kidney stones 2014   right ureteral lithotripsy 01/2013  . History of peptic ulcer   . History of prostate cancer    S/P RADICAL PROSTATECTOMY 2009  . Hyperlipidemia   . Hypertension   . IBS (irritable bowel syndrome)   . Ischemic colitis ([place])   . Nocturia   . OA (osteoarthritis)    RIGHT SHOULDER/ HIPS  . PAC (premature atrial contraction)   . Peyronie's disease   . Right ureteral stone   . 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 SUI (stress urinary incontinence), male   . Wears glasses    Past Surgical History:  Procedure Laterality Date  . CARDIAC CATHETERIZATION  07-23-2003   Non-obstructive CAD/  30% narrowing ostium LM,  30-40% eccentric calcified plaque LAD, 20% narrowing RCA/  preserved LVF  . CARDIOVASCULAR STRESS TEST  12-23-2012  DR ALLRED   Low risk lexiscan study with small mild intensity, fixed inferior defect consistent with thinning, no ischemia/  normal LVF and wall motion, ef 66%  .  COLONOSCOPY  last one 06-19-2011  . CYSTOSCOPY W/ RETROGRADES Right 07/19/2014   Procedure: CYSTOSCOPY WITH RETROGRADE PYELOGRAM;  Surgeon: Malka So, MD;  [facility];  Service: Urology;  Laterality: Right;  . CYSTOSCOPY/RETROGRADE/URETEROSCOPY/STONE EXTRACTION WITH BASKET Right 07/19/2014   Procedure: RIGHT URETEROSCOPY/STONE EXTRACTION WITH BASKET;  Surgeon: Malka So, MD;  Location: [facility];  Service: Urology;  Laterality: Right;  . EP IMPLANTABLE DEVICE N/A 01/13/2016   Procedure: Loop Recorder Insertion;  Surgeon: Deboraha Sprang, MD;  Location: [facility];  Service: Cardiovascular;  Laterality: N/A;  . ESOPHAGOGASTRODUODENOSCOPY (EGD) WITH ESOPHAGEAL DILATION  02-04-2001  . EXTRACORPOREAL SHOCK WAVE LITHOTRIPSY Right 01-30-2013  . FLEXIBLE SIGMOIDOSCOPY N/A 03/19/2014   Procedure: FLEXIBLE SIGMOIDOSCOPY;  Surgeon: Inda Castle, MD;  Location: [facility];  Service: Endoscopy;  Laterality: N/A;  . INGUINAL HERNIA REPAIR Bilateral 03-17-2010  . LOOP RECORDER IMPLANT    . LUMBAR FUSION  03-26-2005   Revision laminectomy L5 - S1,  Laminectomy and Decompression L4-L5, fusion L4 -- S1  . LUMBAR LAMINECTOMY  1999   L5  -- S1  . NASAL SEPTUM SURGERY  YRS AGO  . RADICAL PROSTATECTOMY/ BILATERAL RETROPERITONEAL LYMPH NODE DISSECTION  12-29-2007  . REMOVAL BENIGN RIGHT EYELID LESION  12-19-2007  . SLING CYSTOURETHROPEXY (AdVANCE)  10-12-2009  . TONSILLECTOMY AND  ADENOIDECTOMY  as child  . TRANSTHORACIC ECHOCARDIOGRAM  07-13-2014   mild LVH/  ef 60-655,  grade I diastolic dysfunction/  trivial AR, MR and TR/  mild LAE    reports that he has been smoking Cigarettes.  He has a 53.00 pack-year smoking history. He has never used smokeless tobacco. He reports that he does not drink alcohol or use drugs. family history includes Aneurysm in his father; Bone cancer in his maternal grandfather; Colon cancer (age of onset: 25) in his maternal aunt;  Heart attack (age of onset: 24) in his father; Lymphoma in his maternal aunt. Allergies  Allergen Reactions  . Pseudoephedrine Palpitations and Other (See Comments)    "Heart races"  . Tape Itching and Other (See Comments)    tears skin, can use paper tape  . Sulfa Antibiotics Itching  . Sulfacetamide Sodium Itching  . Lodine [Etodolac] Diarrhea  . Oxycodone Other (See Comments)    "over sedates"   Current Outpatient Prescriptions on File Prior to Visit  Medication Sig Dispense Refill  . bimatoprost (LUMIGAN) 0.01 % SOLN Place 1 drop into both eyes daily.    Marland Kitchen dicyclomine (BENTYL) 10 MG capsule TAKE 1 CAPSULE BY MOUTH THREE TIMES DAILY BEFORE MEALS 270 capsule 0  . dicyclomine (BENTYL) 10 MG capsule TAKE 1 CAPSULE BY MOUTH THREE TIMES DAILY BEFORE MEALS 270 capsule 0  . diltiazem (TIAZAC) 360 MG 24 hr capsule Take 1 capsule (360 mg total) by mouth daily. 90 capsule 3  . Doxepin HCl (SILENOR) 3 MG TABS Take 1 tablet (3 mg total) by mouth at bedtime as needed. (Patient taking differently: Take 1 tablet by mouth at bedtime as needed (for sleep). ) 90 tablet 3  . gabapentin (NEURONTIN) 100 MG capsule Take 2 capsules (200 mg total) by mouth 2 (two) times daily. 120 capsule 5  . HYDROcodone-acetaminophen (NORCO/VICODIN) 5-325 MG tablet Take 1 tablet by mouth every 6 (six) hours as needed for moderate pain.    . metoprolol succinate (TOPROL-XL) 25 MG 24 hr tablet Take 1 tablet (25 mg total) by mouth 2 (two) times daily. 180 tablet 3  . Multiple Vitamin (MULTIVITAMIN WITH MINERALS) TABS tablet Take 1 tablet by mouth daily.    Marland Kitchen omeprazole (PRILOSEC) 20 MG capsule TAKE ONE CAPSULE BY MOUTH TWICE DAILY (Patient taking differently: TAKE 20 MG BY MOUTH TWICE DAILY) 180 capsule 1  . pravastatin (PRAVACHOL) 40 MG tablet Take 1 tablet (40 mg total) by mouth every evening. 90 tablet 3  . topiramate (TOPAMAX) 25 MG tablet Take 25 mg by mouth daily as needed (for headaches). Reported on 12/11/2015    .  umeclidinium-vilanterol (ANORO ELLIPTA) 62.5-25 MCG/INH AEPB Inhale 1 puff into the lungs daily. 30 each 11   No current facility-administered medications on file prior to visit.    Review of Systems All otherwise neg per pt     Objective:   Physical Exam BP 140/82   Pulse 74   Temp 98.4 F (36.9 C) (Oral)   Resp 20   Wt 138 lb (62.6 kg)   SpO2 97%   BMI 22.27 kg/m  VS noted, mild ill appaering Constitutional: Pt appears in no apparent distress HENT: Head: NCAT.  Right Ear: External ear normal.  Left Ear: External ear normal.  Eyes: . Pupils are equal, round, and reactive to light. Conjunctivae and EOM are normal Bilat tm's with mild erythema.  Max sinus areas non tender.  Pharynx with mild erythema, + exudate Neck: Normal range of  motion. Neck supple. with bilat submandibular tender LA Cardiovascular: Normal rate and regular rhythm.   Pulmonary/Chest: Effort normal and breath sounds without rales or wheezing.  Neurological: Pt is alert. Not confused , motor grossly intact Skin: Skin is warm. No rash, no LE edema Psychiatric: Pt behavior is normal. No agitation.     Assessment & Plan:

## 2016-04-13 NOTE — Progress Notes (Signed)
Carelink Summary Report / Loop Recorder 

## 2016-04-13 NOTE — Patient Instructions (Signed)
Please take all new medication as prescribed - the antibiotic  You can also take Delsym OTC for cough, and/or Mucinex (or it's generic off brand) for congestion, and tylenol as needed for pain.  Please continue all other medications as before, and refills have been done if requested.  Please have the pharmacy call with any other refills you may need.  Please keep your appointments with your specialists as you may have planned    

## 2016-04-13 NOTE — Progress Notes (Signed)
Pre visit review using our clinic review tool, if applicable. No additional management support is needed unless otherwise documented below in the visit note. 

## 2016-04-19 LAB — CUP PACEART REMOTE DEVICE CHECK
Date Time Interrogation Session: 20171006213755
Implantable Pulse Generator Implant Date: 20170807

## 2016-04-19 NOTE — Progress Notes (Signed)
Carelink summary report received. Battery status OK. Normal device function. No new symptom episodes, tachy episodes, brady, or pause episodes. No new AF episodes. Monthly summary reports and ROV/PRN 

## 2016-04-20 NOTE — Assessment & Plan Note (Signed)
Mild to mod, for antibx course,  to f/u any worsening symptoms or concerns 

## 2016-04-20 NOTE — Assessment & Plan Note (Signed)
stable overall by history and exam, recent data reviewed with pt, and pt to continue medical treatment as before,  to f/u any worsening symptoms or concerns BP Readings from Last 3 Encounters:  04/13/16 140/82  03/18/16 (!) 150/64  01/29/16 (!) 160/72

## 2016-04-20 NOTE — Assessment & Plan Note (Signed)
Urged to quit, has plan to quit dec 31

## 2016-05-09 ENCOUNTER — Other Ambulatory Visit: Payer: Self-pay | Admitting: Cardiology

## 2016-05-09 ENCOUNTER — Other Ambulatory Visit: Payer: Self-pay | Admitting: Internal Medicine

## 2016-05-12 ENCOUNTER — Ambulatory Visit (INDEPENDENT_AMBULATORY_CARE_PROVIDER_SITE_OTHER): Payer: Medicare HMO | Admitting: *Deleted

## 2016-05-12 DIAGNOSIS — I639 Cerebral infarction, unspecified: Secondary | ICD-10-CM | POA: Diagnosis not present

## 2016-05-13 NOTE — Progress Notes (Signed)
Carelink Summary Report / Loop Recorder 

## 2016-05-28 LAB — CUP PACEART REMOTE DEVICE CHECK
Date Time Interrogation Session: 20171105221223
MDC IDC PG IMPLANT DT: 20170807

## 2016-05-28 NOTE — Progress Notes (Signed)
Carelink summary report received. Battery status OK. Normal device function. No new symptom episodes, tachy episodes, brady, or pause episodes. No new AF episodes. Monthly summary reports and ROV/PRN 

## 2016-06-04 ENCOUNTER — Telehealth: Payer: Self-pay | Admitting: Neurology

## 2016-06-04 NOTE — Telephone Encounter (Signed)
Spoke with patient's wife. She states that over the last few days the eyelid has started drooping over the eye and it is making it hard for the patient to see. No other  new symptoms. The ophthalmologist told them they were concerned about ptosis or myasthenia. They wanted them to see you for evaluation prior to any treatment.  Patient's wife made aware we do not have sooner appt than 06/24/16, but they are on a cancellation list and we will call if anything opens sooner.  They are also aware to call if any other new symptoms develop.

## 2016-06-04 NOTE — Telephone Encounter (Signed)
Not really sure what to do with this message. Is 06/24/16 too far out for an appt?

## 2016-06-04 NOTE — Telephone Encounter (Signed)
This is first addressed to the ophthalmologist.  Do they not accept his diagnosis?  I can see him for my opinion but this isn't urgent.

## 2016-06-04 NOTE — Telephone Encounter (Signed)
[name] [birth year]. His Right Eye has closed completely and can hardly see out of it. Dr. Radford Pax can stitch the lid up for him, but would like Dr. Tomi Likens to check and see what is causing it. Inez Catalina (wife) # is [phone]. He is scheduled for 06/24/16 and is on a wait list but his wife would like to have him seen sooner if possible. Thank you

## 2016-06-05 ENCOUNTER — Emergency Department (HOSPITAL_COMMUNITY)
Admission: EM | Admit: 2016-06-05 | Discharge: 2016-06-05 | Disposition: A | Discharge status: Home / Self Care | Payer: Medicare HMO | Attending: Emergency Medicine | Admitting: Emergency Medicine

## 2016-06-05 ENCOUNTER — Ambulatory Visit (INDEPENDENT_AMBULATORY_CARE_PROVIDER_SITE_OTHER): Payer: Medicare HMO | Admitting: Neurology

## 2016-06-05 ENCOUNTER — Encounter (HOSPITAL_COMMUNITY): Payer: Self-pay

## 2016-06-05 ENCOUNTER — Emergency Department (HOSPITAL_COMMUNITY): Payer: Medicare HMO

## 2016-06-05 ENCOUNTER — Encounter: Payer: Self-pay | Admitting: Neurology

## 2016-06-05 ENCOUNTER — Other Ambulatory Visit (INDEPENDENT_AMBULATORY_CARE_PROVIDER_SITE_OTHER): Payer: Medicare HMO

## 2016-06-05 VITALS — BP 134/70 | HR 62 | Ht 64.0 in | Wt 136.0 lb

## 2016-06-05 DIAGNOSIS — Z8673 Personal history of transient ischemic attack (TIA), and cerebral infarction without residual deficits: Secondary | ICD-10-CM | POA: Diagnosis not present

## 2016-06-05 DIAGNOSIS — I2510 Atherosclerotic heart disease of native coronary artery without angina pectoris: Secondary | ICD-10-CM | POA: Diagnosis not present

## 2016-06-05 DIAGNOSIS — H02401 Unspecified ptosis of right eyelid: Secondary | ICD-10-CM | POA: Insufficient documentation

## 2016-06-05 DIAGNOSIS — F17210 Nicotine dependence, cigarettes, uncomplicated: Secondary | ICD-10-CM | POA: Diagnosis not present

## 2016-06-05 DIAGNOSIS — H532 Diplopia: Secondary | ICD-10-CM | POA: Insufficient documentation

## 2016-06-05 DIAGNOSIS — H4901 Third [oculomotor] nerve palsy, right eye: Secondary | ICD-10-CM | POA: Diagnosis not present

## 2016-06-05 DIAGNOSIS — Z8546 Personal history of malignant neoplasm of prostate: Secondary | ICD-10-CM | POA: Insufficient documentation

## 2016-06-05 DIAGNOSIS — R791 Abnormal coagulation profile: Secondary | ICD-10-CM | POA: Insufficient documentation

## 2016-06-05 DIAGNOSIS — I10 Essential (primary) hypertension: Secondary | ICD-10-CM | POA: Insufficient documentation

## 2016-06-05 DIAGNOSIS — H02409 Unspecified ptosis of unspecified eyelid: Secondary | ICD-10-CM

## 2016-06-05 DIAGNOSIS — R29810 Facial weakness: Secondary | ICD-10-CM | POA: Diagnosis present

## 2016-06-05 LAB — COMPREHENSIVE METABOLIC PANEL
ALT: 35 U/L (ref 17–63)
ANION GAP: 8 (ref 5–15)
AST: 22 U/L (ref 15–41)
Albumin: 3.9 g/dL (ref 3.5–5.0)
Alkaline Phosphatase: 87 U/L (ref 38–126)
BUN: 22 mg/dL — ABNORMAL HIGH (ref 6–20)
CO2: 27 mmol/L (ref 22–32)
Calcium: 9.6 mg/dL (ref 8.9–10.3)
Chloride: 107 mmol/L (ref 101–111)
Creatinine, Ser: 0.85 mg/dL (ref 0.61–1.24)
Glucose, Bld: 112 mg/dL — ABNORMAL HIGH (ref 65–99)
POTASSIUM: 4.6 mmol/L (ref 3.5–5.1)
SODIUM: 142 mmol/L (ref 135–145)
Total Bilirubin: 0.5 mg/dL (ref 0.3–1.2)
Total Protein: 7 g/dL (ref 6.5–8.1)

## 2016-06-05 LAB — DIFFERENTIAL
BASOS PCT: 0 %
Basophils Absolute: 0 10*3/uL (ref 0.0–0.1)
EOS ABS: 0.1 10*3/uL (ref 0.0–0.7)
Eosinophils Relative: 1 %
Lymphocytes Relative: 14 %
Lymphs Abs: 2.1 10*3/uL (ref 0.7–4.0)
MONO ABS: 0.8 10*3/uL (ref 0.1–1.0)
MONOS PCT: 5 %
Neutro Abs: 12.1 10*3/uL — ABNORMAL HIGH (ref 1.7–7.7)
Neutrophils Relative %: 80 %

## 2016-06-05 LAB — CBC
HEMATOCRIT: 42.6 % (ref 39.0–52.0)
Hemoglobin: 14.6 g/dL (ref 13.0–17.0)
MCH: 31.5 pg (ref 26.0–34.0)
MCHC: 34.3 g/dL (ref 30.0–36.0)
MCV: 92 fL (ref 78.0–100.0)
PLATELETS: 327 10*3/uL (ref 150–400)
RBC: 4.63 MIL/uL (ref 4.22–5.81)
RDW: 14.2 % (ref 11.5–15.5)
WBC: 15.2 10*3/uL — ABNORMAL HIGH (ref 4.0–10.5)

## 2016-06-05 LAB — I-STAT CHEM 8, ED
BUN: 27 mg/dL — ABNORMAL HIGH (ref 6–20)
CREATININE: 0.8 mg/dL (ref 0.61–1.24)
Calcium, Ion: 1.26 mmol/L (ref 1.15–1.40)
Chloride: 105 mmol/L (ref 101–111)
Glucose, Bld: 102 mg/dL — ABNORMAL HIGH (ref 65–99)
HEMATOCRIT: 44 % (ref 39.0–52.0)
HEMOGLOBIN: 15 g/dL (ref 13.0–17.0)
Potassium: 4.3 mmol/L (ref 3.5–5.1)
SODIUM: 142 mmol/L (ref 135–145)
TCO2: 29 mmol/L (ref 0–100)

## 2016-06-05 LAB — PROTIME-INR
INR: 1.01
PROTHROMBIN TIME: 13.3 s (ref 11.4–15.2)

## 2016-06-05 LAB — CREATININE, SERUM: Creatinine, Ser: 0.82 mg/dL (ref 0.40–1.50)

## 2016-06-05 LAB — I-STAT TROPONIN, ED: TROPONIN I, POC: 0 ng/mL (ref 0.00–0.08)

## 2016-06-05 LAB — CBG MONITORING, ED: GLUCOSE-CAPILLARY: 104 mg/dL — AB (ref 65–99)

## 2016-06-05 LAB — APTT: APTT: 26 s (ref 24–36)

## 2016-06-05 MED ORDER — GADOBENATE DIMEGLUMINE 529 MG/ML IV SOLN
13.0000 mL | Freq: Once | INTRAVENOUS | Status: AC | PRN
  Administered 2016-06-05: 13 mL via INTRAVENOUS

## 2016-06-05 NOTE — ED Notes (Signed)
EDP at bedside  

## 2016-06-05 NOTE — Discharge Instructions (Signed)
Read the information below.  Your MRI did not show any acute infarct, mass, or hemorrhage. Please call and follow up with Dr. Tomi Likens of Neurology for further management.  Please call and follow up with your primary provider within one week for re-evaluation.   You may return to the Emergency Department at any time for worsening condition or any new symptoms that concern you. Return to ED if develop fever, chest pain, shortness of breath, new/worsening neurologic symptoms, or any other new/concerning symptoms.

## 2016-06-05 NOTE — ED Notes (Signed)
Patient transported to MRI 

## 2016-06-05 NOTE — Progress Notes (Addendum)
NEUROLOGY FOLLOW UP OFFICE NOTE  [name] [id number]  HISTORY OF PRESENT ILLNESS: [name] is a 75 year old right-handed man with hypertension, hyperlipidemia, nonobstructive CAD, palpitations and GERD and history of prostate cancer whom I see for chronic headache, lumbar radiculopathies and left MCA stroke, presents today for right eyelid droop.  He is accompanied by his son-in-law who supplements the history listed below.  Last week, he woke up one morning and his right eyelid was closed shut.  He has chronic headaches but no new associated headache, no eye pain, no dysphagia, right facial weakness or numbness, vertigo, gait instability, or right sided extremity weakness or numbness.  He saw the ophthalmologist last week who diagnosed him with right ptosis.  As per the ophthalmology note from 05/29/16, he did not exhibit any extraocular muscle weakness.  The ptosis is constant and does not fluctuate.  His son-in-law said he has developed binocular vertical double vision only this week.  Again, he denies eye pain, new headache or other lateralizing signs or symptoms.   PAST MEDICAL HISTORY: Past Medical History:  Diagnosis Date  . Adenoma of left adrenal gland    stable per ct  . Coronary artery disease CARDIOLOGIST-- DR Rayann Heman   NON-OBSTRUCTIVE CAD--- 2005 Cath with 30% LAD, calcification/plaque of LAD, EF 60%  . DDD (degenerative disc disease), lumbosacral   . Depression   . Diverticulosis, sigmoid   . Eczema   . ED (erectile dysfunction)   . Encopresis   . Frequency of urination   . GERD (gastroesophageal reflux disease)   . Hemorrhoids 2002  . History of duodenal ulcer   . History of esophageal dilatation    FOR STRICTURE 2008  . History of gallstones   . History of kidney stones 2014   right ureteral lithotripsy 01/2013  . History of peptic ulcer   . History of prostate cancer    S/P RADICAL PROSTATECTOMY 2009  . Hyperlipidemia   . Hypertension   . IBS (irritable  bowel syndrome)   . Ischemic colitis ([place])   . Nocturia   . OA (osteoarthritis)    RIGHT SHOULDER/ HIPS  . PAC (premature atrial contraction)   . Peyronie's disease   . Right ureteral stone   . 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 SUI (stress urinary incontinence), male   . Wears glasses     MEDICATIONS: Current Outpatient Prescriptions on File Prior to Visit  Medication Sig Dispense Refill  . dicyclomine (BENTYL) 10 MG capsule TAKE 1 CAPSULE BY MOUTH THREE TIMES DAILY BEFORE MEALS 270 capsule 0  . diltiazem (TIAZAC) 360 MG 24 hr capsule TAKE 1 CAPSULE(360 MG) BY MOUTH DAILY 90 capsule 2  . HYDROcodone-acetaminophen (NORCO/VICODIN) 5-325 MG tablet Take 1 tablet by mouth every 6 (six) hours as needed for moderate pain.    . metoprolol succinate (TOPROL-XL) 25 MG 24 hr tablet Take 1 tablet (25 mg total) by mouth 2 (two) times daily. 180 tablet 3  . Multiple Vitamin (MULTIVITAMIN WITH MINERALS) TABS tablet Take 1 tablet by mouth daily.    Marland Kitchen omeprazole (PRILOSEC) 20 MG capsule TAKE 1 CAPSULE BY MOUTH TWICE DAILY 180 capsule 3  . pravastatin (PRAVACHOL) 40 MG tablet Take 1 tablet (40 mg total) by mouth every evening. 90 tablet 3  . umeclidinium-vilanterol (ANORO ELLIPTA) 62.5-25 MCG/INH AEPB Inhale 1 puff into the lungs daily. 30 each 11   No current facility-administered medications on file prior to visit.  ALLERGIES: Allergies  Allergen Reactions  . Pseudoephedrine Palpitations and Other (See Comments)    "Heart races"  . Tape Itching and Other (See Comments)    tears skin, can use paper tape  . Sulfa Antibiotics Itching  . Sulfacetamide Sodium Itching  . Lodine [Etodolac] Diarrhea  . Oxycodone Other (See Comments)    "over sedates"    FAMILY HISTORY: Family History  Problem Relation Age of Onset  . Heart attack Father 80  . Aneurysm Father   . Bone cancer Maternal Grandfather   . Colon cancer Maternal Aunt 41  . Lymphoma Maternal Aunt     SOCIAL  HISTORY: Social History   Social History  . Marital status: Married    Spouse name: N/A  . Number of children: 2  . Years of education: N/A   Occupational History  . Retired Retired   Social History Main Topics  . Smoking status: Current Every Day Smoker    Packs/day: 1.00    Years: 53.00    Types: Cigarettes  . Smokeless tobacco: Never Used  . Alcohol use No  . Drug use: No  . Sexual activity: Not on file   Other Topics Concern  . Not on file   Social History Narrative  . No narrative on file    REVIEW OF SYSTEMS: Constitutional: No fevers, chills, or sweats, no generalized fatigue, change in appetite Eyes: Double vision.  No eye pain Ear, nose and throat: No hearing loss, ear pain, nasal congestion, sore throat Cardiovascular: No chest pain, palpitations Respiratory:  No shortness of breath at rest or with exertion, wheezes GastrointestinaI: No nausea, vomiting, diarrhea, abdominal pain, fecal incontinence Genitourinary:  No dysuria, urinary retention or frequency Musculoskeletal:  No neck pain, back pain Integumentary: No rash, pruritus, skin lesions Neurological: as above Psychiatric: No depression, insomnia, anxiety Endocrine: No palpitations, fatigue, diaphoresis, mood swings, change in appetite, change in weight, increased thirst Hematologic/Lymphatic:  No purpura, petechiae. Allergic/Immunologic: no itchy/runny eyes, nasal congestion, recent allergic reactions, rashes  PHYSICAL EXAM: Vitals:   06/05/16 0928  BP: 134/70  Pulse: 62   General: No acute distress.  Patient appears well-groomed.  normal body habitus. Head:  Normocephalic/atraumatic Eyes:  Fundi examined but not visualized Neck: supple, no paraspinal tenderness, full range of motion Heart:  Regular rate and rhythm Lungs:  Clear to auscultation bilaterally Back: No paraspinal tenderness Neurological Exam: alert and oriented to person, place, and time. Attention span and concentration intact,  recent and remote memory intact, fund of knowledge intact.  Speech fluent and not dysarthric, language intact.  Right ptosis with depression of right eye on primary gaze.  Reports improved diplopia when looking down and to the right.  Pupils equal, round and reactive to light.  Otherwise, CN II-XII intact. Bulk and tone normal, muscle strength 5/5 throughout.  Sensation to light touch  intact.  Deep tendon reflexes 2+ throughout.  Finger to nose testing intact.  Gait normal, Romberg negative.  IMPRESSION: Right third nerve palsy.  In absence of other lateralizing signs and symptoms, less likely to be a brainstem stroke.  Less likely to be secondary to pcomm aneurysm as pupil is spared.  Given the sudden onset of symptoms without any fluctuation, myasthenia gravis is less likely.  More likely to be ischemic.  However, he has had continued worsening symptoms (onset of oculomotor weakness this week) and with his prior neurologic history, we need to urgently rule out an intracranial abnormality such as aneurysm or posterior fossa lesion or  stroke.  PLAN: 1.  We will get urgent MRI and MRA of head.  I want it performed today. ADDENDUM:  There apparently aren't any openings for outpatient MRI.  We will have him go to the ED for MRI/MRA of head.  I think this needs to be done today since he has had worsening symptoms of third nerve palsy over the past week (just ptosis last week, now with diplopia this week).  If imaging is indicative of a pcomm aneurysm, then patient needs to go to the ED for surgical evaluation and treatment.  If imaging reveals a stroke, then we need to continue secondary stroke prevention management.  If imaging is unremarkable, then I would presume an ischemic right third nerve palsy.  If this is the case, then symptoms should improve over time.  We will see what imaging shows.  25 minutes spent face to face with patient, over 50% spent discussing differential diagnoses and plan to  get emergent MRI.  12/29/207:  ADDENDUM #2:  MRI and MRA of the head did not reveal acute stroke, bleed or mass lesion.  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  Metta Clines, DO  CC:  Scarlette Calico, MD

## 2016-06-05 NOTE — ED Notes (Signed)
Pt is very upset upon arriving to room and is asking how long he will have to be here. This RN states she is uncertain how long it will take. Pt then states he doesn't want any stroke work up or testing and needs his MRI. Pt states if he has to wait several hours he is going to walk out.

## 2016-06-05 NOTE — ED Provider Notes (Signed)
[facility] DEPT Provider Note   CSN: [id number] Arrival date & time: 06/05/16  1141     History   Chief Complaint Chief Complaint  Patient presents with  . Facial Droop    HPI [name] is a 75 y.o. male with a PMHx of remote CVA, CAD, adrenal adenoma, prostate CA s/p radical prostatectomy, HTN, HLD, PAC's, and other medical conditions listed below, who presents to the ED sent here by his neurologist for an urgent MRI/MRA. Briefly, patient developed sudden onset right eyelid drooping just prior to Christmas, was seen by ophthalmology on 05/29/16 and was told to go to his neurologist for further evaluation. On Monday 06/01/16 (4 days ago) he developed vertical diplopia. Both of his symptoms are constant, with no known aggravating or alleviating factors, no treatments tried. He was seen by his neurologist Dr. Tomi Likens and referred here for an urgent MRI/MRA. He denies any other complaints at this time, including headache, loss of vision, dizziness or lightheadedness, tinnitus, fevers, chills, chest pain, shortness breath, abdominal pain, n/v/d/c, urinary symptoms, numbness, tingling, other focal weakness, loss of balance, head injuries, eye pain or redness or drainage, or any other complaints.   Per Dr. Georgie Chard notes [facility] neurology):  Plan: 1.  We will get urgent MRI and MRA of head.  I want it performed today. ADDENDUM:  There apparently aren't any openings for outpatient MRI.  We will have him go to the ED for MRI/MRA of head.  I think this needs to be done today since he has had worsening symptoms of third nerve palsy over the past week (just ptosis last week, now with diplopia this week). If imaging is indicative of a pcomm aneurysm, then patient needs to go to the ED for surgical evaluation and treatment. If imaging reveals a stroke, then we need to continue secondary stroke prevention management. 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  If this is the case, then symptoms should improve over time.   The history is provided by the patient and medical records. No language interpreter was used.    Past Medical History:  Diagnosis Date  . Adenoma of left adrenal gland    stable per ct  . Coronary artery disease CARDIOLOGIST-- DR Rayann Heman   NON-OBSTRUCTIVE CAD--- 2005 Cath with 30% LAD, calcification/plaque of LAD, EF 60%  . DDD (degenerative disc disease), lumbosacral   . Depression   . Diverticulosis, sigmoid   . Eczema   . ED (erectile dysfunction)   . Encopresis   . Frequency of urination   . GERD (gastroesophageal reflux disease)   . Hemorrhoids 2002  . History of duodenal ulcer   . History of esophageal dilatation    FOR STRICTURE 2008  . History of gallstones   . History of kidney stones 2014   right ureteral lithotripsy 01/2013  . History of peptic ulcer   . History of prostate cancer    S/P RADICAL PROSTATECTOMY 2009  . Hyperlipidemia   . Hypertension   . IBS (irritable bowel syndrome)   . Ischemic colitis ([place])   . Nocturia   . OA (osteoarthritis)    RIGHT SHOULDER/ HIPS  . PAC (premature atrial contraction)   . Peyronie's disease   . Right ureteral stone   . 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 SUI (stress urinary incontinence), male   . Wears glasses     Patient Active Problem List   Diagnosis Date Noted  .  Acute pharyngitis 04/13/2016  . Middle insomnia 12/19/2015  . Cerebral infarction due to thrombosis of left middle cerebral artery ([place]) 10/20/2015  . Obstructive chronic bronchitis with exacerbation ([place]) 09/30/2015  . Allergic rhinitis due to pollen 08/07/2015  . Ischemic colitis ([place]) 03/20/2014  . GERD (gastroesophageal reflux disease) 03/18/2014  . CAD (coronary artery disease) 12/10/2011  . General medical examination 07/17/2011  . CIGARETTE SMOKER 02/13/2009  . PALPITATIONS, CHRONIC 02/04/2009  . LOW BACK PAIN SYNDROME 10/10/2008  . DIVERTICULOSIS-COLON  04/10/2008  . ADENOCARCINOMA, PROSTATE 11/28/2007  . DEPRESSION 08/22/2007  . ERECTILE DYSFUNCTION 07/13/2006  . Essential hypertension 07/13/2006  . Peyronie's disease 07/13/2006    Past Surgical History:  Procedure Laterality Date  . CARDIAC CATHETERIZATION  07-23-2003   Non-obstructive CAD/  30% narrowing ostium LM,  30-40% eccentric calcified plaque LAD, 20% narrowing RCA/  preserved LVF  . CARDIOVASCULAR STRESS TEST  12-23-2012  DR ALLRED   Low risk lexiscan study with small mild intensity, fixed inferior defect consistent with thinning, no ischemia/  normal LVF and wall motion, ef 66%  . COLONOSCOPY  last one 06-19-2011  . CYSTOSCOPY W/ RETROGRADES Right 07/19/2014   Procedure: CYSTOSCOPY WITH RETROGRADE PYELOGRAM;  Surgeon: Malka So, MD;  Location: [facility];  Service: Urology;  Laterality: Right;  . CYSTOSCOPY/RETROGRADE/URETEROSCOPY/STONE EXTRACTION WITH BASKET Right 07/19/2014   Procedure: RIGHT URETEROSCOPY/STONE EXTRACTION WITH BASKET;  Surgeon: Malka So, MD;  Location: [facility];  Service: Urology;  Laterality: Right;  . EP IMPLANTABLE DEVICE N/A 01/13/2016   Procedure: Loop Recorder Insertion;  Surgeon: Deboraha Sprang, MD;  Location: [facility];  Service: Cardiovascular;  Laterality: N/A;  . ESOPHAGOGASTRODUODENOSCOPY (EGD) WITH ESOPHAGEAL DILATION  02-04-2001  . EXTRACORPOREAL SHOCK WAVE LITHOTRIPSY Right 01-30-2013  . FLEXIBLE SIGMOIDOSCOPY N/A 03/19/2014   Procedure: FLEXIBLE SIGMOIDOSCOPY;  Surgeon: Inda Castle, MD;  Location: [facility];  Service: Endoscopy;  Laterality: N/A;  . INGUINAL HERNIA REPAIR Bilateral 03-17-2010  . LOOP RECORDER IMPLANT    . LUMBAR FUSION  03-26-2005   Revision laminectomy L5 - S1,  Laminectomy and Decompression L4-L5, fusion L4 -- S1  . LUMBAR LAMINECTOMY  1999   L5  -- S1  . NASAL SEPTUM SURGERY  YRS AGO  . RADICAL PROSTATECTOMY/ BILATERAL RETROPERITONEAL LYMPH NODE DISSECTION   12-29-2007  . REMOVAL BENIGN RIGHT EYELID LESION  12-19-2007  . SLING CYSTOURETHROPEXY (AdVANCE)  10-12-2009  . TONSILLECTOMY AND ADENOIDECTOMY  as child  . TRANSTHORACIC ECHOCARDIOGRAM  07-13-2014   mild LVH/  ef 60-655,  grade I diastolic dysfunction/  trivial AR, MR and TR/  mild LAE       Home Medications    Prior to Admission medications   Medication Sig Start Date End Date Taking? Authorizing Provider  dicyclomine (BENTYL) 10 MG capsule TAKE 1 CAPSULE BY MOUTH THREE TIMES DAILY BEFORE MEALS 03/21/16   Janith Lima, MD  diltiazem [facility]) 360 MG 24 hr capsule TAKE 1 CAPSULE(360 MG) BY MOUTH DAILY 05/11/16   Larey Dresser, MD  HYDROcodone-acetaminophen (NORCO/VICODIN) 5-325 MG tablet Take 1 tablet by mouth every 6 (six) hours as needed for moderate pain.    Historical Provider, MD  metoprolol succinate (TOPROL-XL) 25 MG 24 hr tablet Take 1 tablet (25 mg total) by mouth 2 (two) times daily. 07/25/15   Larey Dresser, MD  Multiple Vitamin (MULTIVITAMIN WITH MINERALS) TABS tablet Take 1 tablet by mouth daily.    Historical Provider, MD  omeprazole (PRILOSEC) 20  MG capsule TAKE 1 CAPSULE BY MOUTH TWICE DAILY 05/10/16   Janith Lima, MD  pravastatin (PRAVACHOL) 40 MG tablet Take 1 tablet (40 mg total) by mouth every evening. 07/25/15   Larey Dresser, MD  umeclidinium-vilanterol [facility] ELLIPTA) 62.5-25 MCG/INH AEPB Inhale 1 puff into the lungs daily. 10/08/15   Janith Lima, MD    Family History Family History  Problem Relation Age of Onset  . Heart attack Father 53  . Aneurysm Father   . Bone cancer Maternal Grandfather   . Colon cancer Maternal Aunt 84  . Lymphoma Maternal Aunt     Social History Social History  Substance Use Topics  . Smoking status: Current Every Day Smoker    Packs/day: 1.00    Years: 53.00    Types: Cigarettes  . Smokeless tobacco: Never Used  . Alcohol use No     Allergies   Pseudoephedrine; Tape; Sulfa antibiotics; Sulfacetamide sodium;  Lodine [etodolac]; and Oxycodone   Review of Systems Review of Systems  Constitutional: Negative for chills and fever.  HENT: Negative for tinnitus.   Eyes: Positive for visual disturbance (vertical diplopia). Negative for pain, discharge and redness.  Respiratory: Negative for shortness of breath.   Cardiovascular: Negative for chest pain.  Gastrointestinal: Negative for abdominal pain, constipation, diarrhea, nausea and vomiting.  Genitourinary: Negative for dysuria and hematuria.  Musculoskeletal: Negative for arthralgias and myalgias.  Skin: Negative for color change.  Allergic/Immunologic: Negative for immunocompromised state.  Neurological: Positive for facial asymmetry (R eyelid drooping). Negative for dizziness, weakness, light-headedness, numbness and headaches.  Psychiatric/Behavioral: Negative for confusion.   10 Systems reviewed and are negative for acute change except as noted in the HPI.   Physical Exam Updated Vital Signs BP 169/68 (BP Location: Right Arm)   Pulse 66   Temp 98 F (36.7 C) (Oral)   Resp 18   SpO2 98%   Physical Exam  Constitutional: He is oriented to person, place, and time. Vital signs are normal. He appears well-developed and well-nourished.  Non-toxic appearance. No distress.  Afebrile, nontoxic, NAD, initially upset with staff but then calmed down and was more pleasant  HENT:  Head: Normocephalic and atraumatic.  Mouth/Throat: Uvula is midline, oropharynx is clear and moist and mucous membranes are normal.  No face asymmetry aside from R eyelid droop as mentioned below. Tongue protrusion symmetric, uvula midline without deviation  Eyes: Conjunctivae and EOM are normal. Pupils are equal, round, and reactive to light. Right eye exhibits no discharge. Left eye exhibits no discharge.  PERRL, EOMI although there may be slight difficulty/deficit of R eye looking far laterally but it's difficult to assess due to pt complaining of diplopia and not fully  finishing movement. No nystagmus noted. R eyelid ptosis noted. Conjunctiva clear, no ocular drainage  Neck: Normal range of motion. Neck supple. No spinous process tenderness and no muscular tenderness present. No neck rigidity. Normal range of motion present.  FROM intact without spinous process TTP, no bony stepoffs or deformities, no paraspinous muscle TTP or muscle spasms. No rigidity or meningeal signs. No bruising or swelling.   Cardiovascular: Normal rate, regular rhythm, normal heart sounds and intact distal pulses.  Exam reveals no gallop and no friction rub.   No murmur heard. Pulmonary/Chest: Effort normal and breath sounds normal. No respiratory distress. He has no decreased breath sounds. He has no wheezes. He has no rhonchi. He has no rales.  Abdominal: Soft. Normal appearance and bowel sounds are normal. He exhibits  no distension. There is no tenderness. There is no rigidity, no rebound, no guarding, no CVA tenderness, no tenderness at McBurney's point and negative Murphy's sign.  Musculoskeletal: Normal range of motion.  MAE x4 Strength and sensation grossly intact in all extremities Distal pulses intact Gait steady  Neurological: He is alert and oriented to person, place, and time. He has normal strength. No cranial nerve deficit or sensory deficit. Coordination and gait normal. GCS eye subscore is 4. GCS verbal subscore is 5. GCS motor subscore is 6.  CN 2-12 grossly intact aside from R eyelid ptosis previously mentioned A&O x4 GCS 15 Sensation and strength intact Gait nonataxic Coordination with finger-to-nose WNL as long as he closes his R eye Neg pronator drift  Speech clear, no dysarthria or aphasia  Skin: Skin is warm, dry and intact. No rash noted.  Psychiatric: He has a normal mood and affect.  Nursing note and vitals reviewed.    ED Treatments / Results  Labs (all labs ordered are listed, but only abnormal results are displayed) Labs Reviewed  CBC - Abnormal;  Notable for the following:       Result Value   WBC 15.2 (*)    All other components within normal limits  DIFFERENTIAL - Abnormal; Notable for the following:    Neutro Abs 12.1 (*)    All other components within normal limits  COMPREHENSIVE METABOLIC PANEL - Abnormal; Notable for the following:    Glucose, Bld 112 (*)    BUN 22 (*)    All other components within normal limits  CBG MONITORING, ED - Abnormal; Notable for the following:    Glucose-Capillary 104 (*)    All other components within normal limits  I-STAT CHEM 8, ED - Abnormal; Notable for the following:    BUN 27 (*)    Glucose, Bld 102 (*)    All other components within normal limits  PROTIME-INR  APTT  I-STAT TROPOININ, ED    EKG  EKG Interpretation  Date/Time:  Friday June 05 2016 11:57:20 EST Ventricular Rate:  62 PR Interval:  146 QRS Duration: 78 QT Interval:  372 QTC Calculation: 377 R Axis:   -47 Text Interpretation:  Normal sinus rhythm Left anterior fascicular block Abnormal ECG since last tracing no significant change Confirmed by Eulis Foster  MD, ELLIOTT [phone]) on 06/05/2016 1:39:27 PM       Radiology Ct Head Wo Contrast  Result Date: 06/05/2016 CLINICAL DATA:  Right-sided for 5 days. EXAM: CT HEAD WITHOUT CONTRAST TECHNIQUE: Contiguous axial images were obtained from the base of the skull through the vertex without intravenous contrast. COMPARISON:  Brain MRI, 10/18/2015.  Head CT, 06/27/2011. FINDINGS: Brain: There is a wedge-shaped area of encephalomalacia involving the anterolateral left frontal lobe, new since the prior CT, consistent with an old infarct. There are no parenchymal masses or mass effect. There is no evidence of a recent cortical infarct. There are no extra-axial masses or abnormal fluid collections. There is no intracranial hemorrhage. The ventricles are normal in size, for the patient's age, and configuration. Patchy white matter hypoattenuation is noted consistent with mild chronic  microvascular ischemic change. Vascular: No hyperdense vessel or unexpected calcification. Skull: Normal. Negative for fracture or focal lesion. Sinuses/Orbits: Unremarkable globes orbits. Sinuses and mastoid air cells are clear. Other: None. IMPRESSION: 1. No acute intracranial abnormalities. 2. Old left frontal lobe infarct. Age related volume loss. Mild chronic microvascular ischemic change. Electronically Signed   By: Lajean Manes M.D.   On:  06/05/2016 12:23    Procedures Procedures (including critical care time)  Medications Ordered in ED Medications - No data to display   Initial Impression / Assessment and Plan / ED Course  I have reviewed the triage vital signs and the nursing notes.  Pertinent labs & imaging results that were available during my care of the patient were reviewed by me and considered in my medical decision making (see chart for details).  Clinical Course     75 y.o. male here with R eyelid droop x1 wk and diplopia x4 days, sent here by neurology for MRI/MRA urgently, no openings in outpatient schedule. No other focal neuro deficits on exam aside from the R eyelid ptosis; perhaps slightly less lateral movement in R eye but difficult to assess due to pt complaining of diplopia. Requires closing R eye in order to do finger-nose coordination, but able to complete it if he closes R eye. Gait steady. Labs thus far unremarkable aside from mildly elevated WBC 15.2; CMP, trop, and chem 8 pending still. CT head showing old L stroke, no acute findings. EKG without acute ischemic findings. Will order MRI/MRA per neurology recommendations, then reassess shortly  3:22 PM Trop neg. CMP unremarkable. Chem 8 WNL. MRI/MRA currently in process; orders had to be changed based on what Dr. Tomi Likens had ordered (MR brain w wo contrast and MRA head w wo contrast); MRI tech reporting we don't do MRA with contrast, but will add the MRI with contrast order to the current orders; will continue to  await results.   3:43 PM Patient care to be resumed by Gay Filler, PA-C at shift change sign-out. Patient history has been discussed with midlevel resuming care. Please see their notes for further documentation of pending results and dispo/care.  Plan Per neurology: If imaging is indicative of a pcomm aneurysm, then patient needs to go to the ED for surgical evaluation and treatment. If imaging reveals a stroke, then we need to continue secondary stroke prevention management. If imaging is unremarkable, then I would presume an ischemic right third nerve palsy.  If this is the case, then symptoms should improve over time.  Pt stable at sign-out and updated on transfer of care. Please see Ashley's notes for documentation of results and further care.    Final Clinical Impressions(s) / ED Diagnoses   Final diagnoses:  Ptosis of eyelid, right  Diplopia    New Prescriptions New Prescriptions   No medications on file     [place] Camprubi-Soms, PA-C 06/05/16 [place], MD 06/05/16 1551

## 2016-06-05 NOTE — ED Triage Notes (Addendum)
Pt here from PCP office with right eye droop. He reports the eye has been drooping since before christmas. Pt reports double vision began on Monday. Pt sent here to have MRI. Pt reports hx of stroke.

## 2016-06-05 NOTE — ED Provider Notes (Signed)
3:43pm: Care assumed from [facility], PA-C.   [name] is a 75 y.o. male with remote h/o CVA, CAD, adrenal adenoma, prostate cancer s/p radical prostatectomy, HLD, HTN, PACs presents to ED from neurologist, Dr. Tomi Likens office, for urgent MRI/MRA. Pt has right eyelid ptosis onset prior to christmas and right eye vertical diplopia onset christmas day.  Seen by Dr. Tomi Likens today, no OP MRI available today and prompted to come to ED. No other complaints. No other focal deficits on initial exam except right eye ptosis and complaints of diplopia. CT shows no acute findings. EKG shows no acute ischemic findings. Elevated WBC, labs otherwise re-assuring. At sign out MRI/MRA pending.   Plan per neurology:   If imaging is indicative of a pcomm aneurysm, then patient needs to go to the ED for surgical evaluation and treatment. If imaging reveals a stroke, then we need to continue secondary stroke prevention management. If imaging is unremarkable, then I would presume an ischemic right third nerve palsy.  If this is the case, then symptoms should improve over time.  5:39 PM: MRI/MRA results show no acute abnormalities to explain symptoms - no infarct, hemorrhage, anuerysm, or mass. Chronic microvascular disease.  Blood pressure 161/75, pulse 60, temperature 98.3 F (36.8 C), temperature source Oral, resp. rate 18, SpO2 98 %. On re-evaluation patient has no complaints. Right eyelid ptosis present, otherwise no facial asymmetry. PERRL. EOMI. Heart RRR. Lungs CTABL. Abdomen non-distended.  ?CN 3 palsy.  Discussed results and plan with patient. Follow up with Dr. Tomi Likens and PCP within one week for re-evaluation. Return precautions given. Pt voiced understanding and is agreeable.    Roxanna Mew, PA-C 06/05/16 [place], MD 06/06/16 [phone]

## 2016-06-05 NOTE — ED Provider Notes (Signed)
  Face-to-face evaluation   History:  He presents for evaluation of 2 weeks of droopiness of the right eyelid, and several days of double vision. He saw a neurologist today to evaluate these problems and the patient was sent here for MRI imaging. He denies nausea, vomiting, or fever.  Physical exam: Alert, cooperative. No dysarthria or aphasia. Right eye ptosis.  Medical screening examination/treatment/procedure(s) were conducted as a shared visit with non-physician practitioner(s) and myself.  I personally evaluated the patient during the encounter   Daleen Bo, MD 06/05/16 1551

## 2016-06-05 NOTE — Patient Instructions (Signed)
You have what is called a Third Nerve Palsy.   We will get MRI of brain and blood vessels of head If MRI is unrevealing, then I think you probably had a tiny stroke to that nerve.  There wouldn't be anything different to do other than continuing secondary stroke prevention.

## 2016-06-05 NOTE — ED Notes (Signed)
Pt. Refusing to be placed in a gown. Pt. Seen exiting emergency department for cigarette break. Pt. Informed that the room cannot be held for him if he exits the emergency department. Pt. Upset that he has had to wait so long.

## 2016-06-09 ENCOUNTER — Telehealth: Payer: Self-pay | Admitting: Neurology

## 2016-06-09 ENCOUNTER — Other Ambulatory Visit: Payer: Self-pay

## 2016-06-09 DIAGNOSIS — H02409 Unspecified ptosis of unspecified eyelid: Secondary | ICD-10-CM

## 2016-06-09 NOTE — Telephone Encounter (Signed)
Sharee Pimple from [facility] called in regards to PT/Dawn CB# [phone]

## 2016-06-09 NOTE — Telephone Encounter (Signed)
Called [name] back and she just needed to know when patient needed to be seen.  Informed her first available.

## 2016-06-09 NOTE — Telephone Encounter (Signed)
I spoke with [name] wife Inez [place].  I reviewed the MRI and MRA of head, which did not reveal stroke or mass lesion.  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  In the meantime, I wouldn't get any eyelid surgery now until we can see how much it improves.  As long as he sees double vision, he should not drive unless he wears a patch (in which case he will always need to turn his head to the side of the patch whenever he changes lane, to compensate for blind spot.

## 2016-06-11 ENCOUNTER — Ambulatory Visit (INDEPENDENT_AMBULATORY_CARE_PROVIDER_SITE_OTHER): Payer: Medicare HMO | Admitting: *Deleted

## 2016-06-11 ENCOUNTER — Other Ambulatory Visit: Payer: Medicare HMO

## 2016-06-11 ENCOUNTER — Ambulatory Visit: Payer: Medicare HMO | Admitting: Neurology

## 2016-06-11 DIAGNOSIS — I639 Cerebral infarction, unspecified: Secondary | ICD-10-CM

## 2016-06-11 DIAGNOSIS — H02409 Unspecified ptosis of unspecified eyelid: Secondary | ICD-10-CM | POA: Diagnosis not present

## 2016-06-12 NOTE — Progress Notes (Signed)
Carelink Summary Report / Loop Recorder 

## 2016-06-17 DIAGNOSIS — H4901 Third [oculomotor] nerve palsy, right eye: Secondary | ICD-10-CM | POA: Diagnosis not present

## 2016-06-18 ENCOUNTER — Encounter: Payer: Self-pay | Admitting: Family

## 2016-06-18 ENCOUNTER — Ambulatory Visit (INDEPENDENT_AMBULATORY_CARE_PROVIDER_SITE_OTHER): Payer: Medicare HMO | Admitting: Family

## 2016-06-18 VITALS — BP 162/72 | HR 103 | Temp 98.1°F | Resp 16 | Ht 64.0 in | Wt 135.0 lb

## 2016-06-18 DIAGNOSIS — J441 Chronic obstructive pulmonary disease with (acute) exacerbation: Secondary | ICD-10-CM | POA: Diagnosis not present

## 2016-06-18 MED ORDER — LEVOFLOXACIN 500 MG PO TABS: 500.0000 mg | ORAL_TABLET | Freq: Every day | ORAL | 0 refills | Status: DC

## 2016-06-18 NOTE — Assessment & Plan Note (Signed)
Symptoms and exam are consistent with obstructive chronic bronchitis with exacerbation with increased sputum production and purulence. Start levofloxacin. Sample of Incruse provided. Continue previously prescribed Anoro. Follow up if symptoms worsen or do not improve.

## 2016-06-18 NOTE — Progress Notes (Signed)
Subjective:    Patient ID: [name], male    DOB: 1940-07-16, 76 y.o.   MRN: [id number]  Chief Complaint  Patient presents with  . Cough    cough and congestion x3 days    HPI:  [name] is a 76 y.o. male who  has a past medical history of Adenoma of left adrenal gland; Coronary artery disease (CARDIOLOGIST-- DR Rayann Heman); DDD (degenerative disc disease), lumbosacral; Depression; Diverticulosis, sigmoid; Eczema; ED (erectile dysfunction); Encopresis; Frequency of urination; GERD (gastroesophageal reflux disease); Hemorrhoids (2002); History of duodenal ulcer; History of esophageal dilatation; History of gallstones; History of kidney stones (2014); History of peptic ulcer; History of prostate cancer; Hyperlipidemia; Hypertension; IBS (irritable bowel syndrome); Ischemic colitis ([place]); Nocturia; OA (osteoarthritis); PAC (premature atrial contraction); Peyronie's disease; Right ureteral stone; Rotator cuff tear arthropathy of both shoulders; SUI (stress urinary incontinence), male; and Wears glasses. and presents today for an acute office visit.  This is a new problem. Associated symptoms of cough and congestion has been going on for about 3 days. Describes the cough as a "deep cough". Also endorses some mild chest discomfort mostly when he is coughing. No fevers. Denies any modifying factors or attempted treatments. Indicates antihistamines and decongestants make him feel really bad. Denies any recent antibiotics. Has concern for progression as his wife has COPD.   Allergies  Allergen Reactions  . Pseudoephedrine Palpitations and Other (See Comments)    "Heart races"  . Tape Itching and Other (See Comments)    tears skin, can use paper tape  . Sulfa Antibiotics Itching  . Sulfacetamide Sodium Itching  . Lodine [Etodolac] Diarrhea  . Oxycodone Other (See Comments)    "over sedates"     Outpatient Medications Prior to Visit  Medication Sig Dispense Refill  . dicyclomine (BENTYL)  10 MG capsule TAKE 1 CAPSULE BY MOUTH THREE TIMES DAILY BEFORE MEALS 270 capsule 0  . diltiazem (TIAZAC) 360 MG 24 hr capsule TAKE 1 CAPSULE(360 MG) BY MOUTH DAILY 90 capsule 2  . HYDROcodone-acetaminophen (NORCO/VICODIN) 5-325 MG tablet Take 1 tablet by mouth every 6 (six) hours as needed for moderate pain.    . metoprolol succinate (TOPROL-XL) 25 MG 24 hr tablet Take 1 tablet (25 mg total) by mouth 2 (two) times daily. 180 tablet 3  . Multiple Vitamin (MULTIVITAMIN WITH MINERALS) TABS tablet Take 1 tablet by mouth daily.    Marland Kitchen omeprazole (PRILOSEC) 20 MG capsule TAKE 1 CAPSULE BY MOUTH TWICE DAILY 180 capsule 3  . pravastatin (PRAVACHOL) 40 MG tablet Take 1 tablet (40 mg total) by mouth every evening. 90 tablet 3  . umeclidinium-vilanterol (ANORO ELLIPTA) 62.5-25 MCG/INH AEPB Inhale 1 puff into the lungs daily. 30 each 11   No facility-administered medications prior to visit.      Review of Systems  Constitutional: Negative for chills and fever.  HENT: Positive for congestion and sneezing. Negative for sinus pain, sinus pressure and sore throat.   Respiratory: Positive for cough and wheezing. Negative for chest tightness and shortness of breath.   Cardiovascular: Positive for chest pain.      Objective:    BP (!) 162/72 (BP Location: Left Arm, Patient Position: Sitting, Cuff Size: Normal)   Pulse (!) 103   Temp 98.1 F (36.7 C) (Oral)   Resp 16   Ht 5\' 4"  (1.626 m)   Wt 135 lb (61.2 kg)   SpO2 96%   BMI 23.17 kg/m  Nursing note and vital signs reviewed.  Physical  Exam  Constitutional: He is oriented to person, place, and time. He appears well-developed and well-nourished. No distress.  HENT:  Right Ear: Hearing, tympanic membrane, external ear and ear canal normal.  Left Ear: Hearing, tympanic membrane, external ear and ear canal normal.  Nose: Nose normal. Right sinus exhibits no maxillary sinus tenderness and no frontal sinus tenderness. Left sinus exhibits no maxillary  sinus tenderness and no frontal sinus tenderness.  Mouth/Throat: Uvula is midline, oropharynx is clear and moist and mucous membranes are normal.  Neck: Neck supple.  Cardiovascular: Normal rate, regular rhythm, normal heart sounds and intact distal pulses.   Pulmonary/Chest: Effort normal. No respiratory distress. He has wheezes. He has no rales. He exhibits no tenderness.  Lymphadenopathy:    He has no cervical adenopathy.  Neurological: He is alert and oriented to person, place, and time.  Skin: Skin is warm and dry.  Psychiatric: He has a normal mood and affect. His behavior is normal. Judgment and thought content normal.       Assessment & Plan:   Problem List Items Addressed This Visit      Respiratory   Obstructive chronic bronchitis with exacerbation ([place]) - Primary    Symptoms and exam are consistent with obstructive chronic bronchitis with exacerbation with increased sputum production and purulence. Start levofloxacin. Sample of Incruse provided. Continue previously prescribed Anoro. Follow up if symptoms worsen or do not improve.           I am having Mr. Geidel start on levofloxacin. I am also having him maintain his multivitamin with minerals, pravastatin, metoprolol succinate, umeclidinium-vilanterol, HYDROcodone-acetaminophen, dicyclomine, diltiazem, and omeprazole.   Meds ordered this encounter  Medications  . levofloxacin (LEVAQUIN) 500 MG tablet    Sig: Take 1 tablet (500 mg total) by mouth daily.    Dispense:  7 tablet    Refill:  0    Order Specific Question:   Supervising Provider    Answer:   Pricilla Holm A [id number]     Follow-up: Return if symptoms worsen or fail to improve.  Mauricio Po, FNP

## 2016-06-18 NOTE — Patient Instructions (Addendum)
Thank you for choosing [facility].  SUMMARY AND INSTRUCTIONS:  Medication:  Start the Incruse daily.   Your prescription(s) have been submitted to your pharmacy or been printed and provided for you. Please take as directed and contact our office if you believe you are having problem(s) with the medication(s) or have any questions.  Follow up:  If your symptoms worsen or fail to improve, please contact our office for further instruction, or in case of emergency go directly to the emergency room at the closest medical facility.    General Recommendations:    Please drink plenty of fluids.  Get plenty of rest   Sleep in humidified air  Use saline nasal sprays  Netti pot   OTC Medications:  Decongestants - helps relieve congestion   Flonase (generic fluticasone) or Nasacort (generic triamcinolone) - please make sure to use the "cross-over" technique at a 45 degree angle towards the opposite eye as opposed to straight up the nasal passageway.   Sudafed (generic pseudoephedrine - Note this is the one that is available behind the pharmacy counter); Products with phenylephrine (-PE) may also be used but is often not as effective as pseudoephedrine.   If you have HIGH BLOOD PRESSURE - Coricidin HBP; AVOID any product that is -D as this contains pseudoephedrine which may increase your blood pressure.  Afrin (oxymetazoline) every 6-8 hours for up to 3 days.   Allergies - helps relieve runny nose, itchy eyes and sneezing   Claritin (generic loratidine), Allegra (fexofenidine), or Zyrtec (generic cyrterizine) for runny nose. These medications should not cause drowsiness.  Note - Benadryl (generic diphenhydramine) may be used however may cause drowsiness  Cough -   Delsym or Robitussin (generic dextromethorphan)  Expectorants - helps loosen mucus to ease removal   Mucinex (generic guaifenesin) as directed on the package.  Headaches / General Aches   Tylenol  (generic acetaminophen) - DO NOT EXCEED 3 grams (3,000 mg) in a 24 hour time period  Advil/Motrin (generic ibuprofen)   Sore Throat -   Salt water gargle   Chloraseptic (generic benzocaine) spray or lozenges / Sucrets (generic dyclonine)

## 2016-06-19 LAB — MYASTHENIA GRAVIS PANEL 2
ACETYLCHOLINE REC BINDING: 11.1 nmol/L — AB
ACEYTLCHOLINE REC BLOC AB: 48 %{inhibition} — AB (ref <15)
Acetylcholine Rec Mod Ab: 91 % binding inhibition — ABNORMAL HIGH

## 2016-06-23 ENCOUNTER — Telehealth: Payer: Self-pay | Admitting: Neurology

## 2016-06-23 MED ORDER — PYRIDOSTIGMINE BROMIDE 60 MG PO TABS: 60.0000 mg | ORAL_TABLET | Freq: Three times a day (TID) | ORAL | 2 refills | Status: DC

## 2016-06-23 NOTE — Telephone Encounter (Signed)
I called and spoke with Mr. Deyette.  He still reports double vision.  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  I explained what myasthenia gravis is and that I would like to start Mestinon 60mg  three times daily to see if his double vision improves.  Side effects such as GI issues discussed.  If needed, he can make a sooner appointment to see me to discuss further.  Otherwise, he is already scheduled for follow up in April.

## 2016-06-24 ENCOUNTER — Ambulatory Visit: Payer: Medicare HMO | Admitting: Neurology

## 2016-06-28 LAB — CUP PACEART REMOTE DEVICE CHECK
Implantable Pulse Generator Implant Date: 20170807
MDC IDC SESS DTM: 20171205224701

## 2016-06-28 NOTE — Progress Notes (Signed)
Carelink summary report received. Battery status OK. Normal device function. No new symptom episodes, tachy episodes, brady, or pause episodes. No new AF episodes. Monthly summary reports and ROV/PRN 

## 2016-06-29 ENCOUNTER — Other Ambulatory Visit: Payer: Self-pay | Admitting: Internal Medicine

## 2016-06-29 DIAGNOSIS — K219 Gastro-esophageal reflux disease without esophagitis: Secondary | ICD-10-CM

## 2016-06-29 MED ORDER — OMEPRAZOLE 20 MG PO CPDR: 20.0000 mg | DELAYED_RELEASE_CAPSULE | Freq: Two times a day (BID) | ORAL | 3 refills | Status: DC

## 2016-06-30 ENCOUNTER — Emergency Department (HOSPITAL_COMMUNITY)
Admission: EM | Admit: 2016-06-30 | Discharge: 2016-06-30 | Disposition: A | Discharge status: Home / Self Care | Payer: Medicare HMO | Attending: Emergency Medicine | Admitting: Emergency Medicine

## 2016-06-30 ENCOUNTER — Encounter (HOSPITAL_COMMUNITY): Payer: Self-pay | Admitting: Emergency Medicine

## 2016-06-30 ENCOUNTER — Emergency Department (HOSPITAL_COMMUNITY): Payer: Medicare HMO

## 2016-06-30 DIAGNOSIS — F17210 Nicotine dependence, cigarettes, uncomplicated: Secondary | ICD-10-CM | POA: Insufficient documentation

## 2016-06-30 DIAGNOSIS — I10 Essential (primary) hypertension: Secondary | ICD-10-CM | POA: Diagnosis not present

## 2016-06-30 DIAGNOSIS — R197 Diarrhea, unspecified: Secondary | ICD-10-CM | POA: Diagnosis not present

## 2016-06-30 DIAGNOSIS — I2510 Atherosclerotic heart disease of native coronary artery without angina pectoris: Secondary | ICD-10-CM | POA: Diagnosis not present

## 2016-06-30 DIAGNOSIS — R064 Hyperventilation: Secondary | ICD-10-CM | POA: Insufficient documentation

## 2016-06-30 DIAGNOSIS — R05 Cough: Secondary | ICD-10-CM | POA: Diagnosis not present

## 2016-06-30 DIAGNOSIS — R404 Transient alteration of awareness: Secondary | ICD-10-CM | POA: Diagnosis not present

## 2016-06-30 DIAGNOSIS — R531 Weakness: Secondary | ICD-10-CM | POA: Diagnosis not present

## 2016-06-30 NOTE — ED Notes (Signed)
ED Provider at bedside. 

## 2016-06-30 NOTE — ED Notes (Signed)
Patient was alert, oriented and stable upon discharge. RN went over AVS and patient had no further questions.  

## 2016-06-30 NOTE — ED Triage Notes (Signed)
Per EMS, patient from home, c/o productive cough x8 days. States he was dx with bronchitis at PCP and finished abx without relief of sx. Ambulatory with EMS. Denies chest pain and SOB.

## 2016-06-30 NOTE — Discharge Instructions (Signed)
Make sure that you're eating a low fiber diet to decrease the diarrhea.  It is fine to use Imodium as needed for diarrhea.  Make sure that you're getting plenty of rest, and drinking a lot of fluids.

## 2016-06-30 NOTE — ED Provider Notes (Signed)
[facility] DEPT Provider Note   CSN: [id number] Arrival date & time: 06/30/16  1925     History   Chief Complaint Chief Complaint  Patient presents with  . Cough    HPI [name] is a 76 y.o. male.   He presents for evaluation of intermittent diarrhea for 1 week. He also had a period of hyperventilation associated with sweatiness. He did not pass out. He denies chest or new back pain. Evaluate several weeks ago for a droopy right eyelid with double vision but states that these have almost completely resolved. He saw his PCP, one week ago and was treated with antibiotic, Levaquin, but has since completed that course. He has previously had trouble with diarrhea after using antibiotics. He has no other complaints. There are no other known modifying factors.   HPI  Past Medical History:  Diagnosis Date  . Adenoma of left adrenal gland    stable per ct  . Coronary artery disease CARDIOLOGIST-- DR Rayann Heman   NON-OBSTRUCTIVE CAD--- 2005 Cath with 30% LAD, calcification/plaque of LAD, EF 60%  . DDD (degenerative disc disease), lumbosacral   . Depression   . Diverticulosis, sigmoid   . Eczema   . ED (erectile dysfunction)   . Encopresis   . Frequency of urination   . GERD (gastroesophageal reflux disease)   . Hemorrhoids 2002  . History of duodenal ulcer   . History of esophageal dilatation    FOR STRICTURE 2008  . History of gallstones   . History of kidney stones 2014   right ureteral lithotripsy 01/2013  . History of peptic ulcer   . History of prostate cancer    S/P RADICAL PROSTATECTOMY 2009  . Hyperlipidemia   . Hypertension   . IBS (irritable bowel syndrome)   . Ischemic colitis ([place])   . Nocturia   . OA (osteoarthritis)    RIGHT SHOULDER/ HIPS  . PAC (premature atrial contraction)   . Peyronie's disease   . Right ureteral stone   . 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 SUI (stress urinary incontinence), male   . Wears glasses     Patient  Active Problem List   Diagnosis Date Noted  . Middle insomnia 12/19/2015  . Cerebral infarction due to thrombosis of left middle cerebral artery ([place]) 10/20/2015  . Obstructive chronic bronchitis with exacerbation ([place]) 09/30/2015  . Allergic rhinitis due to pollen 08/07/2015  . Ischemic colitis ([place]) 03/20/2014  . GERD (gastroesophageal reflux disease) 03/18/2014  . CAD (coronary artery disease) 12/10/2011  . General medical examination 07/17/2011  . CIGARETTE SMOKER 02/13/2009  . PALPITATIONS, CHRONIC 02/04/2009  . LOW BACK PAIN SYNDROME 10/10/2008  . DIVERTICULOSIS-COLON 04/10/2008  . ADENOCARCINOMA, PROSTATE 11/28/2007  . DEPRESSION 08/22/2007  . ERECTILE DYSFUNCTION 07/13/2006  . Essential hypertension 07/13/2006  . Peyronie's disease 07/13/2006    Past Surgical History:  Procedure Laterality Date  . CARDIAC CATHETERIZATION  07-23-2003   Non-obstructive CAD/  30% narrowing ostium LM,  30-40% eccentric calcified plaque LAD, 20% narrowing RCA/  preserved LVF  . CARDIOVASCULAR STRESS TEST  12-23-2012  DR ALLRED   Low risk lexiscan study with small mild intensity, fixed inferior defect consistent with thinning, no ischemia/  normal LVF and wall motion, ef 66%  . COLONOSCOPY  last one 06-19-2011  . CYSTOSCOPY W/ RETROGRADES Right 07/19/2014   Procedure: CYSTOSCOPY WITH RETROGRADE PYELOGRAM;  Surgeon: Malka So, MD;  Location: [facility];  Service: Urology;  Laterality: Right;  .  CYSTOSCOPY/RETROGRADE/URETEROSCOPY/STONE EXTRACTION WITH BASKET Right 07/19/2014   Procedure: RIGHT URETEROSCOPY/STONE EXTRACTION WITH BASKET;  Surgeon: Malka So, MD;  Location: [facility];  Service: Urology;  Laterality: Right;  . EP IMPLANTABLE DEVICE N/A 01/13/2016   Procedure: Loop Recorder Insertion;  Surgeon: Deboraha Sprang, MD;  Location: [facility];  Service: Cardiovascular;  Laterality: N/A;  . ESOPHAGOGASTRODUODENOSCOPY (EGD) WITH ESOPHAGEAL DILATION   02-04-2001  . EXTRACORPOREAL SHOCK WAVE LITHOTRIPSY Right 01-30-2013  . FLEXIBLE SIGMOIDOSCOPY N/A 03/19/2014   Procedure: FLEXIBLE SIGMOIDOSCOPY;  Surgeon: Inda Castle, MD;  Location: [facility];  Service: Endoscopy;  Laterality: N/A;  . INGUINAL HERNIA REPAIR Bilateral 03-17-2010  . LOOP RECORDER IMPLANT    . LUMBAR FUSION  03-26-2005   Revision laminectomy L5 - S1,  Laminectomy and Decompression L4-L5, fusion L4 -- S1  . LUMBAR LAMINECTOMY  1999   L5  -- S1  . NASAL SEPTUM SURGERY  YRS AGO  . RADICAL PROSTATECTOMY/ BILATERAL RETROPERITONEAL LYMPH NODE DISSECTION  12-29-2007  . REMOVAL BENIGN RIGHT EYELID LESION  12-19-2007  . SLING CYSTOURETHROPEXY ([facility])  10-12-2009  . TONSILLECTOMY AND ADENOIDECTOMY  as child  . TRANSTHORACIC ECHOCARDIOGRAM  07-13-2014   mild LVH/  ef 60-655,  grade I diastolic dysfunction/  trivial AR, MR and TR/  mild LAE       Home Medications    Prior to Admission medications   Medication Sig Start Date End Date Taking? Authorizing Provider  dicyclomine (BENTYL) 10 MG capsule TAKE 1 CAPSULE BY MOUTH THREE TIMES DAILY BEFORE MEALS 03/21/16   Janith Lima, MD  diltiazem [facility]) 360 MG 24 hr capsule TAKE 1 CAPSULE(360 MG) BY MOUTH DAILY 05/11/16   Larey Dresser, MD  HYDROcodone-acetaminophen (NORCO/VICODIN) 5-325 MG tablet Take 1 tablet by mouth every 6 (six) hours as needed for moderate pain.    Historical Provider, MD  levofloxacin (LEVAQUIN) 500 MG tablet Take 1 tablet (500 mg total) by mouth daily. 06/18/16   Golden Circle, FNP  metoprolol succinate (TOPROL-XL) 25 MG 24 hr tablet Take 1 tablet (25 mg total) by mouth 2 (two) times daily. 07/25/15   Larey Dresser, MD  Multiple Vitamin (MULTIVITAMIN WITH MINERALS) TABS tablet Take 1 tablet by mouth daily.    Historical Provider, MD  omeprazole (PRILOSEC) 20 MG capsule Take 1 capsule (20 mg total) by mouth 2 (two) times daily. 06/29/16   Janith Lima, MD  pravastatin (PRAVACHOL) 40 MG tablet  Take 1 tablet (40 mg total) by mouth every evening. 07/25/15   Larey Dresser, MD  pyridostigmine (MESTINON) 60 MG tablet Take 1 tablet (60 mg total) by mouth 3 (three) times daily. 06/23/16   Pieter Partridge, DO  umeclidinium-vilanterol (ANORO ELLIPTA) 62.5-25 MCG/INH AEPB Inhale 1 puff into the lungs daily. 10/08/15   Janith Lima, MD    Family History Family History  Problem Relation Age of Onset  . Heart attack Father 88  . Aneurysm Father   . Bone cancer Maternal Grandfather   . Colon cancer Maternal Aunt 27  . Lymphoma Maternal Aunt     Social History Social History  Substance Use Topics  . Smoking status: Current Every Day Smoker    Packs/day: 1.00    Years: 53.00    Types: Cigarettes  . Smokeless tobacco: Never Used  . Alcohol use No     Allergies   Pseudoephedrine; Tape; Sulfa antibiotics; Sulfacetamide sodium; Lodine [etodolac]; and Oxycodone   Review of Systems Review  of Systems  All other systems reviewed and are negative.    Physical Exam Updated Vital Signs BP 180/78 (BP Location: Left Arm)   Pulse 71   Temp 98.1 F (36.7 C) (Oral)   Resp 18   Ht 5\' 6"  (1.676 m)   Wt 129 lb 9 oz (58.8 kg)   SpO2 94%   BMI 20.91 kg/m   Physical Exam  Constitutional: He is oriented to person, place, and time. He appears well-developed.  Frail, elderly  HENT:  Head: Normocephalic and atraumatic.  Right Ear: External ear normal.  Left Ear: External ear normal.  Eyes: Conjunctivae and EOM are normal. Pupils are equal, round, and reactive to light.  Neck: Normal range of motion and phonation normal. Neck supple.  Cardiovascular: Normal rate, regular rhythm and normal heart sounds.   Pulmonary/Chest: Effort normal and breath sounds normal. He exhibits no bony tenderness.  Abdominal: Soft. He exhibits no distension. There is no tenderness. There is no guarding.  Musculoskeletal: Normal range of motion.  Neurological: He is alert and oriented to person, place, and time.  No cranial nerve deficit or sensory deficit. He exhibits normal muscle tone. Coordination normal.  Skin: Skin is warm, dry and intact.  Psychiatric: He has a normal mood and affect. His behavior is normal. Judgment and thought content normal.  Nursing note and vitals reviewed.    ED Treatments / Results  Labs (all labs ordered are listed, but only abnormal results are displayed) Labs Reviewed - No data to display  EKG  EKG Interpretation None       Radiology Dg Chest 2 View  Result Date: 06/30/2016 CLINICAL DATA:  Cough and nausea for 1 week. EXAM: CHEST  2 VIEW COMPARISON:  08/07/2014 FINDINGS: The heart size and mediastinal contours are within normal limits. Mild left basilar scarring. No evidence of pulmonary consolidation or edema. No evidence of pleural effusion or pneumothorax. Aortic atherosclerosis. Cardiac loop recorder noted. IMPRESSION: Mild left basilar scarring.  No active lung disease. Electronically Signed   By: Earle Gell M.D.   On: 06/30/2016 20:25    Procedures Procedures (including critical care time)  Medications Ordered in ED Medications - No data to display   Initial Impression / Assessment and Plan / ED Course  I have reviewed the triage vital signs and the nursing notes.  Pertinent labs & imaging results that were available during my care of the patient were reviewed by me and considered in my medical decision making (see chart for details).     Medications - No data to display  Patient Vitals for the past 24 hrs:  BP Temp Temp src Pulse Resp SpO2 Height Weight  06/30/16 2126 180/78 - - 71 18 94 % - -  06/30/16 1936 153/77 98.1 F (36.7 C) Oral 72 18 95 % 5\' 6"  (1.676 m) 129 lb 9 oz (58.8 kg)  06/30/16 1932 - - - - - 94 % - -    10:52 PM Reevaluation with update and discussion. After initial assessment and treatment, an updated evaluation reveals No change in clinical status. Findings discussed with patient and son, all questions were  answered. Nyra Anspaugh L    Final Clinical Impressions(s) / ED Diagnoses   Final diagnoses:  Diarrhea, unspecified type  Hyperventilation    Post antibiotic diarrhea, likely noninfectious. Doubt serious bacterial infection. Metabolic instability or impending vascular collapse.   Nursing Notes Reviewed/ Care Coordinated Applicable Imaging Reviewed Interpretation of Laboratory Data incorporated into ED treatment  The  patient appears reasonably screened and/or stabilized for discharge and I doubt any other medical condition or other [facility] requiring further screening, evaluation, or treatment in the ED at this time prior to discharge.  Plan: Home Medications- continue; Home Treatments- rest; return here if the recommended treatment, does not improve the symptoms; Recommended follow up- PCP prn   New Prescriptions New Prescriptions   No medications on file     Daleen Bo, MD 06/30/16 2257

## 2016-07-01 ENCOUNTER — Telehealth: Payer: Self-pay

## 2016-07-01 NOTE — Telephone Encounter (Signed)
Pt called and stated that he was seen in ED yesterday for diarrhea after completing dose of abx.   Pt stated that his stool was not watery but loose and NOT formed. Pt stated that he was not having SOB, CP, fever or DOE. Pt stated that he has florastor but has not been taking it.   Pt wanted to know if he can wait for his appt tomorrow.  Informed pt that I would forward to PCP.  Instructed pt that if any of the negative symptoms above started to go to ED immediately.

## 2016-07-02 ENCOUNTER — Ambulatory Visit (INDEPENDENT_AMBULATORY_CARE_PROVIDER_SITE_OTHER): Payer: Medicare HMO | Admitting: Internal Medicine

## 2016-07-02 ENCOUNTER — Encounter: Payer: Self-pay | Admitting: Internal Medicine

## 2016-07-02 VITALS — BP 140/70 | HR 87 | Temp 98.2°F | Resp 16 | Ht 66.0 in | Wt 132.0 lb

## 2016-07-02 DIAGNOSIS — R059 Cough, unspecified: Secondary | ICD-10-CM

## 2016-07-02 DIAGNOSIS — J441 Chronic obstructive pulmonary disease with (acute) exacerbation: Secondary | ICD-10-CM

## 2016-07-02 DIAGNOSIS — R05 Cough: Secondary | ICD-10-CM | POA: Diagnosis not present

## 2016-07-02 LAB — POCT EXHALED NITRIC OXIDE: FENO LEVEL (PPB): 12

## 2016-07-02 MED ORDER — INDACATEROL-GLYCOPYRROLATE 27.5-15.6 MCG IN CAPS: 1.0000 | ORAL_CAPSULE | Freq: Two times a day (BID) | RESPIRATORY_TRACT | 11 refills | Status: DC

## 2016-07-02 MED ORDER — PROMETHAZINE-DM 6.25-15 MG/5ML PO SYRP: 5.0000 mL | ORAL_SOLUTION | Freq: Four times a day (QID) | ORAL | 0 refills | Status: DC | PRN

## 2016-07-02 NOTE — Progress Notes (Signed)
Pre visit review using our clinic review tool, if applicable. No additional management support is needed unless otherwise documented below in the visit note. 

## 2016-07-02 NOTE — Progress Notes (Signed)
Subjective:  Patient ID: [name], male    DOB: [birth year]  Age: 76 y.o. MRN: [id number]  CC: Cough   HPI [name] presents for a 3 week hx of NP cough with SOB and wheezing. His cough keeps him awake at night. He has taken a course of levaquin with no improvement.  Outpatient Medications Prior to Visit  Medication Sig Dispense Refill  . dicyclomine (BENTYL) 10 MG capsule TAKE 1 CAPSULE BY MOUTH THREE TIMES DAILY BEFORE MEALS 270 capsule 0  . diltiazem (TIAZAC) 360 MG 24 hr capsule TAKE 1 CAPSULE(360 MG) BY MOUTH DAILY 90 capsule 2  . HYDROcodone-acetaminophen (NORCO/VICODIN) 5-325 MG tablet Take 1 tablet by mouth every 6 (six) hours as needed for moderate pain.    . metoprolol succinate (TOPROL-XL) 25 MG 24 hr tablet Take 1 tablet (25 mg total) by mouth 2 (two) times daily. 180 tablet 3  . Multiple Vitamin (MULTIVITAMIN WITH MINERALS) TABS tablet Take 1 tablet by mouth daily.    Marland Kitchen omeprazole (PRILOSEC) 20 MG capsule Take 1 capsule (20 mg total) by mouth 2 (two) times daily. 180 capsule 3  . pravastatin (PRAVACHOL) 40 MG tablet Take 1 tablet (40 mg total) by mouth every evening. 90 tablet 3  . pyridostigmine (MESTINON) 60 MG tablet Take 1 tablet (60 mg total) by mouth 3 (three) times daily. 90 tablet 2  . levofloxacin (LEVAQUIN) 500 MG tablet Take 1 tablet (500 mg total) by mouth daily. 7 tablet 0  . umeclidinium-vilanterol (ANORO ELLIPTA) 62.5-25 MCG/INH AEPB Inhale 1 puff into the lungs daily. 30 each 11   No facility-administered medications prior to visit.     ROS Review of Systems  Constitutional: Negative for activity change, chills, diaphoresis, fatigue and fever.  HENT: Negative.  Negative for trouble swallowing.   Eyes: Negative.  Negative for visual disturbance.  Respiratory: Positive for cough, shortness of breath and wheezing. Negative for choking and chest tightness.   Cardiovascular: Negative for chest pain, palpitations and leg swelling.  Gastrointestinal:  Negative for abdominal pain, blood in stool, constipation, diarrhea, nausea and vomiting.  Endocrine: Negative.   Genitourinary: Negative.   Musculoskeletal: Negative.  Negative for back pain.  Allergic/Immunologic: Negative.   Neurological: Negative.  Negative for dizziness, weakness and numbness.  Hematological: Negative for adenopathy. Does not bruise/bleed easily.  Psychiatric/Behavioral: Negative.     Objective:  BP 140/70 (BP Location: Left Arm, Patient Position: Sitting, Cuff Size: Normal)   Pulse 87   Temp 98.2 F (36.8 C) (Oral)   Resp 16   Ht 5\' 6"  (1.676 m)   Wt 132 lb (59.9 kg)   SpO2 95%   BMI 21.31 kg/m   BP Readings from Last 3 Encounters:  07/02/16 140/70  06/30/16 180/78  06/18/16 (!) 162/72    Wt Readings from Last 3 Encounters:  07/02/16 132 lb (59.9 kg)  06/30/16 129 lb 9 oz (58.8 kg)  06/18/16 135 lb (61.2 kg)    Physical Exam  Constitutional: He is oriented to person, place, and time.  Non-toxic appearance. He does not have a sickly appearance. He does not appear ill. No distress.  HENT:  Mouth/Throat: Oropharynx is clear and moist. No oropharyngeal exudate.  Eyes: Conjunctivae are normal. Right eye exhibits no discharge. Left eye exhibits no discharge. No scleral icterus.  Neck: Normal range of motion. Neck supple. No JVD present. No tracheal deviation present. No thyromegaly present.  Cardiovascular: Normal rate, regular rhythm, normal heart sounds and intact distal pulses.  Exam reveals no gallop and no friction rub.   No murmur heard. Pulmonary/Chest: Effort normal. No accessory muscle usage. No respiratory distress. He has no decreased breath sounds. He has wheezes in the right middle field, the right lower field, the left middle field and the left lower field. He has rhonchi in the right middle field, the right lower field, the left middle field and the left lower field. He has no rales.  Diffuse inspiratory and expiratory wheezes and rhonchi in  both bases, he has good air movement  Abdominal: Soft. Bowel sounds are normal. He exhibits no distension and no mass. There is no tenderness. There is no rebound and no guarding.  Musculoskeletal: Normal range of motion. He exhibits no edema, tenderness or deformity.  Lymphadenopathy:    He has no cervical adenopathy.  Neurological: He is oriented to person, place, and time.  Skin: Skin is warm and dry. No rash noted. He is not diaphoretic. No erythema. No pallor.  Vitals reviewed.   Lab Results  Component Value Date   WBC 15.2 (H) 06/05/2016   HGB 15.0 06/05/2016   HCT 44.0 06/05/2016   PLT 327 06/05/2016   GLUCOSE 102 (H) 06/05/2016   CHOL 142 08/07/2015   TRIG 133.0 08/07/2015   HDL 45.70 08/07/2015   LDLDIRECT 134.5 08/22/2007   LDLCALC 70 08/07/2015   ALT 35 06/05/2016   AST 22 06/05/2016   NA 142 06/05/2016   K 4.3 06/05/2016   CL 105 06/05/2016   CREATININE 0.80 06/05/2016   BUN 27 (H) 06/05/2016   CO2 27 06/05/2016   TSH 1.26 08/07/2015   PSA 6.69 (H) 08/22/2007   INR 1.01 06/05/2016    Dg Chest 2 View  Result Date: 06/30/2016 CLINICAL DATA:  Cough and nausea for 1 week. EXAM: CHEST  2 VIEW COMPARISON:  08/07/2014 FINDINGS: The heart size and mediastinal contours are within normal limits. Mild left basilar scarring. No evidence of pulmonary consolidation or edema. No evidence of pleural effusion or pneumothorax. Aortic atherosclerosis. Cardiac loop recorder noted. IMPRESSION: Mild left basilar scarring.  No active lung disease. Electronically Signed   By: Earle Gell M.D.   On: 06/30/2016 20:25    Assessment & Plan:   [name] was seen today for cough.  Diagnoses and all orders for this visit:  Cough- I don't think this is a bacterial infection, will treat the COPD and offer a cough suppressant -     promethazine-dextromethorphan (PROMETHAZINE-DM) 6.25-15 MG/5ML syrup; Take 5 mLs by mouth 4 (four) times daily as needed for cough. -     POCT EXHALED NITRIC  OXIDE  Obstructive chronic bronchitis with exacerbation (Emma)- he has a low FeNO score so I don't think he would benefit from any form of steroid therapy, I have asked him to start a LAMA/LABA combination and I gave him a sample of Utibron and showed him how to use it. He demonstrated proficiency with its use. -     Indacaterol-Glycopyrrolate (UTIBRON NEOHALER) 27.5-15.6 MCG CAPS; Place 1 puff into inhaler and inhale 2 (two) times daily.   I have discontinued [name] umeclidinium-vilanterol and levofloxacin. I am also having him start on promethazine-dextromethorphan and Indacaterol-Glycopyrrolate. Additionally, I am having him maintain his multivitamin with minerals, pravastatin, metoprolol succinate, HYDROcodone-acetaminophen, dicyclomine, diltiazem, pyridostigmine, and omeprazole.  Meds ordered this encounter  Medications  . promethazine-dextromethorphan (PROMETHAZINE-DM) 6.25-15 MG/5ML syrup    Sig: Take 5 mLs by mouth 4 (four) times daily as needed for cough.  Dispense:  118 mL    Refill:  0  . Indacaterol-Glycopyrrolate (UTIBRON NEOHALER) 27.5-15.6 MCG CAPS    Sig: Place 1 puff into inhaler and inhale 2 (two) times daily.    Dispense:  60 capsule    Refill:  11     Follow-up: Return in about 4 weeks (around 07/30/2016).  Scarlette Calico, MD

## 2016-07-02 NOTE — Patient Instructions (Signed)
Cough, Adult Coughing is a reflex that clears your throat and your airways. Coughing helps to heal and protect your lungs. It is normal to cough occasionally, but a cough that happens with other symptoms or lasts a long time may be a sign of a condition that needs treatment. A cough may last only 2-3 weeks (acute), or it may last longer than 8 weeks (chronic). What are the causes? Coughing is commonly caused by:  Breathing in substances that irritate your lungs.  A viral or bacterial respiratory infection.  Allergies.  Asthma.  Postnasal drip.  Smoking.  Acid backing up from the stomach into the esophagus (gastroesophageal reflux).  Certain medicines.  Chronic lung problems, including COPD (or rarely, lung cancer).  Other medical conditions such as heart failure.  Follow these instructions at home: Pay attention to any changes in your symptoms. Take these actions to help with your discomfort:  Take medicines only as told by your health care provider. ? If you were prescribed an antibiotic medicine, take it as told by your health care provider. Do not stop taking the antibiotic even if you start to feel better. ? Talk with your health care provider before you take a cough suppressant medicine.  Drink enough fluid to keep your urine clear or pale yellow.  If the air is dry, use a cold steam vaporizer or humidifier in your bedroom or your home to help loosen secretions.  Avoid anything that causes you to cough at work or at home.  If your cough is worse at night, try sleeping in a semi-upright position.  Avoid cigarette smoke. If you smoke, quit smoking. If you need help quitting, ask your health care provider.  Avoid caffeine.  Avoid alcohol.  Rest as needed.  Contact a health care provider if:  You have new symptoms.  You cough up pus.  Your cough does not get better after 2-3 weeks, or your cough gets worse.  You cannot control your cough with suppressant  medicines and you are losing sleep.  You develop pain that is getting worse or pain that is not controlled with pain medicines.  You have a fever.  You have unexplained weight loss.  You have night sweats. Get help right away if:  You cough up blood.  You have difficulty breathing.  Your heartbeat is very fast. This information is not intended to replace advice given to you by your health care provider. Make sure you discuss any questions you have with your health care provider. Document Released: 11/21/2010 Document Revised: 10/31/2015 Document Reviewed: 08/01/2014 Elsevier Interactive Patient Education  2017 [employer]  

## 2016-07-06 ENCOUNTER — Other Ambulatory Visit: Payer: Self-pay | Admitting: Cardiology

## 2016-07-07 ENCOUNTER — Telehealth: Payer: Self-pay | Admitting: Gastroenterology

## 2016-07-07 NOTE — Telephone Encounter (Signed)
Patient reports that he was treated with antibiotics about 3 weeks ago for bronchitis.  He was having loose stools so he took an imodium on Sunday and then was constipated so last night he took a laxative.  He reports that he had a large BM this am, but since he has had "anal leakage".  Patient has a history of cdiff colitis.  He is advised to hold all laxatives or antidiarrheal agents and call back on Friday with an update.   Dr. Fuller Plan if still having loose stools or diarrhea on Friday do you want to do any stool studies?

## 2016-07-07 NOTE — Telephone Encounter (Signed)
Yes, GI pathogen panel if diarrhea persist into tomorrow.

## 2016-07-10 ENCOUNTER — Telehealth: Payer: Self-pay | Admitting: Gastroenterology

## 2016-07-10 DIAGNOSIS — R197 Diarrhea, unspecified: Secondary | ICD-10-CM

## 2016-07-10 NOTE — Telephone Encounter (Signed)
Patient called back to report continued diarrhea.  He will come for a GI pathogen panel per instructions from phone note on 07/07/16 he understands to come to the lab at his convenience

## 2016-07-13 ENCOUNTER — Other Ambulatory Visit: Payer: Medicare HMO

## 2016-07-13 ENCOUNTER — Ambulatory Visit (INDEPENDENT_AMBULATORY_CARE_PROVIDER_SITE_OTHER): Payer: Medicare HMO | Admitting: *Deleted

## 2016-07-13 DIAGNOSIS — I639 Cerebral infarction, unspecified: Secondary | ICD-10-CM | POA: Diagnosis not present

## 2016-07-14 ENCOUNTER — Other Ambulatory Visit: Payer: Medicare HMO

## 2016-07-14 ENCOUNTER — Other Ambulatory Visit: Payer: Self-pay | Admitting: Internal Medicine

## 2016-07-14 DIAGNOSIS — R197 Diarrhea, unspecified: Secondary | ICD-10-CM

## 2016-07-14 DIAGNOSIS — K219 Gastro-esophageal reflux disease without esophagitis: Secondary | ICD-10-CM

## 2016-07-14 MED ORDER — OMEPRAZOLE 20 MG PO CPDR: 20.0000 mg | DELAYED_RELEASE_CAPSULE | Freq: Two times a day (BID) | ORAL | 3 refills | Status: AC

## 2016-07-14 NOTE — Progress Notes (Signed)
Carelink Summary Report / Loop Recorder 

## 2016-07-16 ENCOUNTER — Other Ambulatory Visit: Payer: Self-pay | Admitting: Internal Medicine

## 2016-07-16 DIAGNOSIS — R059 Cough, unspecified: Secondary | ICD-10-CM

## 2016-07-16 DIAGNOSIS — R05 Cough: Secondary | ICD-10-CM

## 2016-07-16 LAB — GASTROINTESTINAL PATHOGEN PANEL PCR
C. DIFFICILE TOX A/B, PCR: DETECTED — AB
CRYPTOSPORIDIUM, PCR: NOT DETECTED
Campylobacter, PCR: NOT DETECTED
E COLI (ETEC) LT/ST, PCR: NOT DETECTED
E COLI (STEC) STX1/STX2, PCR: NOT DETECTED
E coli 0157, PCR: NOT DETECTED
Giardia lamblia, PCR: NOT DETECTED
Norovirus, PCR: NOT DETECTED
ROTAVIRUS, PCR: NOT DETECTED
Salmonella, PCR: NOT DETECTED
Shigella, PCR: NOT DETECTED

## 2016-07-17 ENCOUNTER — Telehealth: Payer: Self-pay | Admitting: Gastroenterology

## 2016-07-17 DIAGNOSIS — H4901 Third [oculomotor] nerve palsy, right eye: Secondary | ICD-10-CM | POA: Diagnosis not present

## 2016-07-17 MED ORDER — METRONIDAZOLE 500 MG PO TABS: 500.0000 mg | ORAL_TABLET | Freq: Three times a day (TID) | ORAL | 0 refills | Status: DC

## 2016-07-17 MED ORDER — SACCHAROMYCES BOULARDII 250 MG PO CAPS: 250.0000 mg | ORAL_CAPSULE | Freq: Two times a day (BID) | ORAL | 1 refills | Status: AC

## 2016-07-17 MED ORDER — VANCOMYCIN HCL 250 MG PO CAPS: ORAL_CAPSULE | ORAL | 0 refills | Status: DC

## 2016-07-17 NOTE — Telephone Encounter (Signed)
Vancomycin 250 mg po qid for 2 weeks, 250 mg bid for 1 week, 250 mg qd for 1 week, 250 mg qod for 1 week and 250 mg every 3rd day for 2 weeks. Florastor bid for 2 months

## 2016-07-17 NOTE — Telephone Encounter (Signed)
Given its a recurrent infection he needs PO Vancomycin Q6H for 14 days for treatment. We can try sending the taper prescription later next week if his insurance is not willing to cover now, also try liquid formulary.  Last C.diff infection in July 2016.

## 2016-07-17 NOTE — Telephone Encounter (Signed)
Spoke with patient and informed him we are sending in flagyl 500 mg three times a day x 14 days. Also that he need to continue Florastor and to call us if he continues to have diarrhea.

## 2016-07-17 NOTE — Telephone Encounter (Signed)
Called [facility] on [address] and spoke with the pharmacist and had him run Vancomycin x 2 weeks and the liquid and he states that its still over $300. He said it's because the patient has not met his deductable this year. Called patient and explained. Patient states he still cannot afford the medication. Pleas advise Dr. Silverio Decamp.

## 2016-07-17 NOTE — Telephone Encounter (Signed)
Ok, please send prescription for Flagyl 500 mg Q8h x 14 days, please check with patient early next week, he will need to contact us if symptoms are not improving. He will also need a taper to be prescribed later after completion of 2 week course of Flagyl.

## 2016-07-17 NOTE — Telephone Encounter (Signed)
Patient with + cdiff and a history.  His last episode he was treated with Vancomycin.  Dr. Hilarie Fredrickson you are MD of the day.  Please advise

## 2016-07-17 NOTE — Telephone Encounter (Signed)
Lab called me overnight.  C difficile toxin positive on this patient.

## 2016-07-17 NOTE — Addendum Note (Signed)
Addended by: Marlon Pel on: 07/17/2016 01:59 PM   Modules accepted: Orders

## 2016-07-17 NOTE — Telephone Encounter (Signed)
Patients wife notified of the results. Rx sent.  She will have him call back if the diarrhea does not resolve.

## 2016-07-17 NOTE — Telephone Encounter (Signed)
Dr. Fuller Plan, patient's states that the Vancomycin rx is too expensive. Can we send something in it's place?

## 2016-07-21 ENCOUNTER — Ambulatory Visit: Payer: Medicare HMO | Admitting: Cardiology

## 2016-07-21 ENCOUNTER — Telehealth: Payer: Self-pay | Admitting: Gastroenterology

## 2016-07-21 NOTE — Telephone Encounter (Signed)
Patient reports nausea with flagyl. He will try taking with a full meal.  He is having some inner ear issues and wonders if this is contributing. He will see his primary care for this

## 2016-07-22 DIAGNOSIS — J310 Chronic rhinitis: Secondary | ICD-10-CM | POA: Diagnosis not present

## 2016-07-22 DIAGNOSIS — Z720 Tobacco use: Secondary | ICD-10-CM | POA: Diagnosis not present

## 2016-07-22 DIAGNOSIS — H903 Sensorineural hearing loss, bilateral: Secondary | ICD-10-CM | POA: Diagnosis not present

## 2016-07-22 DIAGNOSIS — H9113 Presbycusis, bilateral: Secondary | ICD-10-CM | POA: Diagnosis not present

## 2016-07-22 DIAGNOSIS — H6981 Other specified disorders of Eustachian tube, right ear: Secondary | ICD-10-CM | POA: Diagnosis not present

## 2016-07-25 LAB — CUP PACEART REMOTE DEVICE CHECK
Implantable Pulse Generator Implant Date: 20170807
MDC IDC SESS DTM: 20180104231106

## 2016-07-25 NOTE — Progress Notes (Signed)
Carelink summary report received. Battery status OK. Normal device function. No new symptom episodes, tachy episodes, brady, or pause episodes. No new AF episodes. Monthly summary reports and ROV/PRN 

## 2016-07-27 ENCOUNTER — Ambulatory Visit (INDEPENDENT_AMBULATORY_CARE_PROVIDER_SITE_OTHER): Payer: Medicare HMO | Admitting: Internal Medicine

## 2016-07-27 ENCOUNTER — Encounter: Payer: Self-pay | Admitting: Internal Medicine

## 2016-07-27 VITALS — BP 140/58 | HR 72 | Temp 98.7°F | Resp 16 | Ht 66.0 in | Wt 134.0 lb

## 2016-07-27 DIAGNOSIS — I10 Essential (primary) hypertension: Secondary | ICD-10-CM | POA: Diagnosis not present

## 2016-07-27 DIAGNOSIS — G7001 Myasthenia gravis with (acute) exacerbation: Secondary | ICD-10-CM

## 2016-07-27 DIAGNOSIS — A0472 Enterocolitis due to Clostridium difficile, not specified as recurrent: Secondary | ICD-10-CM

## 2016-07-27 MED ORDER — METRONIDAZOLE 500 MG PO TABS: 250.0000 mg | ORAL_TABLET | Freq: Three times a day (TID) | ORAL | 0 refills | Status: DC

## 2016-07-27 NOTE — Progress Notes (Signed)
Pre visit review using our clinic review tool, if applicable. No additional management support is needed unless otherwise documented below in the visit note. 

## 2016-07-27 NOTE — Progress Notes (Signed)
Subjective:  Patient ID: [name], male    DOB: [birth year]  Age: 76 y.o. 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 He has been placed on pyridostigmine but still complains of diffuse muscle weakness and eye muscle weakness. He has also had a few episodes of blurred vision. In addition, he was diagnosed with C. difficile colitis. He has been trying to take metronidazole at 500 mg 3 times a day but that has caused some nausea and vomiting - he wants to know if he can lower the dose. He says his bowel movements have normalized and he is having 3 formed stools a day.  Outpatient Medications Prior to Visit  Medication Sig Dispense Refill  . dicyclomine (BENTYL) 10 MG capsule TAKE 1 CAPSULE BY MOUTH THREE TIMES DAILY BEFORE MEALS 270 capsule 0  . diltiazem (TIAZAC) 360 MG 24 hr capsule TAKE 1 CAPSULE(360 MG) BY MOUTH DAILY 90 capsule 2  . HYDROcodone-acetaminophen (NORCO/VICODIN) 5-325 MG tablet Take 1 tablet by mouth every 6 (six) hours as needed for moderate pain.    . Indacaterol-Glycopyrrolate (UTIBRON NEOHALER) 27.5-15.6 MCG CAPS Place 1 puff into inhaler and inhale 2 (two) times daily. 60 capsule 11  . metoprolol succinate (TOPROL-XL) 25 MG 24 hr tablet Take 1 tablet (25 mg total) by mouth 2 (two) times daily. 180 tablet 3  . omeprazole (PRILOSEC) 20 MG capsule Take 1 capsule (20 mg total) by mouth 2 (two) times daily. 180 capsule 3  . pyridostigmine (MESTINON) 60 MG tablet Take 1 tablet (60 mg total) by mouth 3 (three) times daily. 90 tablet 2  . saccharomyces boulardii (FLORASTOR) 250 MG capsule Take 1 capsule (250 mg total) by mouth 2 (two) times daily. 60 capsule 1  . diltiazem (TIAZAC) 360 MG 24 hr capsule TAKE 1 CAPSULE(360 MG) BY MOUTH DAILY 30 capsule 0  . metroNIDAZOLE (FLAGYL) 500 MG tablet Take 1 tablet (500 mg total) by mouth 3  (three) times daily. 42 tablet 0  . Multiple Vitamin (MULTIVITAMIN WITH MINERALS) TABS tablet Take 1 tablet by mouth daily.    . pravastatin (PRAVACHOL) 40 MG tablet Take 1 tablet (40 mg total) by mouth every evening. 90 tablet 3  . promethazine-dextromethorphan (PROMETHAZINE-DM) 6.25-15 MG/5ML syrup TAKE 5 ML(1 TEASPOONFUL) BY MOUTH FOUR TIMES DAILY AS NEEDED FOR COUGH 118 mL 1   No facility-administered medications prior to visit.     ROS Review of Systems  Constitutional: Negative for activity change, appetite change, diaphoresis, fatigue and unexpected weight change.  HENT: Negative.   Eyes: Negative.   Respiratory: Negative.  Negative for cough, chest tightness, shortness of breath and wheezing.   Cardiovascular: Negative for chest pain, palpitations and leg swelling.  Gastrointestinal: Negative for abdominal pain, constipation, diarrhea, nausea and vomiting.  Endocrine: Negative.  Negative for cold intolerance and heat intolerance.  Genitourinary: Negative.  Negative for difficulty urinating, dysuria, frequency and hematuria.  Musculoskeletal: Negative for arthralgias, back pain, myalgias and neck stiffness.  Skin: Negative.  Negative for color change and rash.  Neurological: Positive for weakness. Negative for dizziness, tremors, numbness and headaches.  Hematological: Negative for adenopathy. Does not bruise/bleed easily.  Psychiatric/Behavioral: Negative.     Objective:  BP (!) 160/58 (BP Location: Left Arm, Patient Position: Sitting, Cuff Size: Normal)   Pulse 72   Temp 98.7  F (37.1 C) (Oral)   Resp 16   Ht 5\' 6"  (1.676 m)   Wt 134 lb (60.8 kg)   SpO2 95%   BMI 21.63 kg/m   BP Readings from Last 3 Encounters:  07/27/16 (!) 160/58  07/02/16 140/70  06/30/16 180/78    Wt Readings from Last 3 Encounters:  07/27/16 134 lb (60.8 kg)  07/02/16 132 lb (59.9 kg)  06/30/16 129 lb 9 oz (58.8 kg)    Physical Exam  Constitutional: He is oriented to person, place, and  time. No distress.  HENT:  Mouth/Throat: Oropharynx is clear and moist. No oropharyngeal exudate.  Eyes: Conjunctivae are normal. Right eye exhibits no discharge. Left eye exhibits no discharge. No scleral icterus.  Neck: Normal range of motion. Neck supple. No JVD present. No tracheal deviation present. No thyromegaly present.  Cardiovascular: Normal rate, regular rhythm, normal heart sounds and intact distal pulses.  Exam reveals no gallop and no friction rub.   No murmur heard. Pulmonary/Chest: Effort normal and breath sounds normal. No stridor. No respiratory distress. He has no wheezes. He has no rales. He exhibits no tenderness.  Abdominal: Soft. Bowel sounds are normal. He exhibits no distension and no mass. There is no tenderness. There is no rebound and no guarding.  Musculoskeletal: Normal range of motion. He exhibits no edema, tenderness or deformity.  Lymphadenopathy:    He has no cervical adenopathy.  Neurological: He is oriented to person, place, and time.  Skin: Skin is warm. No rash noted. He is not diaphoretic. No erythema. No pallor.  Vitals reviewed.   Lab Results  Component Value Date   WBC 15.2 (H) 06/05/2016   HGB 15.0 06/05/2016   HCT 44.0 06/05/2016   PLT 327 06/05/2016   GLUCOSE 102 (H) 06/05/2016   CHOL 142 08/07/2015   TRIG 133.0 08/07/2015   HDL 45.70 08/07/2015   LDLDIRECT 134.5 08/22/2007   LDLCALC 70 08/07/2015   ALT 35 06/05/2016   AST 22 06/05/2016   NA 142 06/05/2016   K 4.3 06/05/2016   CL 105 06/05/2016   CREATININE 0.80 06/05/2016   BUN 27 (H) 06/05/2016   CO2 27 06/05/2016   TSH 1.26 08/07/2015   PSA 6.69 (H) 08/22/2007   INR 1.01 06/05/2016    Dg Chest 2 View  Result Date: 06/30/2016 CLINICAL DATA:  Cough and nausea for 1 week. EXAM: CHEST  2 VIEW COMPARISON:  08/07/2014 FINDINGS: The heart size and mediastinal contours are within normal limits. Mild left basilar scarring. No evidence of pulmonary consolidation or edema. No evidence  of pleural effusion or pneumothorax. Aortic atherosclerosis. Cardiac loop recorder noted. IMPRESSION: Mild left basilar scarring.  No active lung disease. Electronically Signed   By: Earle Gell M.D.   On: 06/30/2016 20:25    Assessment & Plan:   Benett was seen today for hypertension.  Diagnoses and all orders for this visit:  Myasthenia gravis in crisis [facility])- I have encouraged him to stay on pyridostigmine and to give the disease process more time for recovery, in the meantime I have asked him to take a holiday from the statins to see if this helps control his symptoms.  Clostridium difficile colitis- will decrease the dose of metronidazole from 500 mg 3 times a day to 250 mg times a day to mitigate his side effects and to help him complete the treatment course. -     metroNIDAZOLE (FLAGYL) 500 MG tablet; Take 0.5 tablets (250 mg total) by mouth 3 (  three) times daily.  Essential hypertension- his blood pressure is adequately well controlled.   I have discontinued Mr. Baldridge multivitamin with minerals, pravastatin, and promethazine-dextromethorphan. I have also changed his metroNIDAZOLE. Additionally, I am having him maintain his metoprolol succinate, HYDROcodone-acetaminophen, dicyclomine, diltiazem, pyridostigmine, Indacaterol-Glycopyrrolate, omeprazole, and saccharomyces boulardii.  Meds ordered this encounter  Medications  . metroNIDAZOLE (FLAGYL) 500 MG tablet    Sig: Take 0.5 tablets (250 mg total) by mouth 3 (three) times daily.    Dispense:  42 tablet    Refill:  0     Follow-up: Return in about 2 months (around 09/24/2016).  Scarlette Calico, MD

## 2016-07-27 NOTE — Patient Instructions (Signed)
Clostridium Difficile Infection Introduction  Clostridium difficile (C. difficile or C. diff) infection is a condition that causes inflammation of the large intestine (colon). This condition can result in damage to the lining of your colon and may lead to colitis. This infection can be passed from person to person (is contagious). What are the causes? C. diff is a bacterium that is normally found in the colon. This infection is caused when the balance of C. diff is changed and there is an overgrowth of C. diff. This is often caused by antibiotic use. What increases the risk? This condition is more likely to develop in people who:  Take antibiotic medicines.  Take a certain type of medicine called proton pump inhibitors over a long period of time (chronic use).  Are older.  Have had a C. diff infection before.  Have serious underlying conditions, such as colon cancer.  Are in the hospital.  Have a weak defense (immune) system.  Live in a place where there is a lot of contact with others, such as a nursing home.  Have had gastrointestinal (GI) tract surgery. What are the signs or symptoms? Symptoms of this condition include:  Diarrhea. This may be bloody, watery, or yellow or green in color.  Fever.  Fatigue.  Loss of appetite.  Nausea.  Swelling, pain, or tenderness in the abdomen.  Dehydration. Dehydration can cause you to be tired and thirsty, have a dry mouth, and urinate less frequently. How is this diagnosed? This condition is diagnosed with a medical history and physical exam. You may also have tests, including:  A test that checks for C. diff in your stool.  Blood tests.  A sigmoidoscopy or colonoscopy to look at your colon. These procedures involve passing an instrument through your rectum to look at the inside of your colon. How is this treated? Treatment for this condition includes:  Antibiotics that keep C. diff from growing.  Stopping the antibiotics  you were on before the C. diff infection began. Only do this as told by your health care provider.  Fluids through an IV tube, if you are dehydrated.  Surgery to remove the infected part of the colon. This is rare. Follow these instructions at home: Eating and drinking  Drink enough fluid to keep your urine clear or pale yellow. Avoid milk, caffeine, and alcohol.  Follow specific rehydration instructions as told by your health care provider.  Eat small, frequent meals instead of large meals. Medicines  Take your antibiotic medicine as told by your health care provider. Do not stop taking the antibiotic even if you start to feel better unless your health care provider told you to do that.  Take over-the-counter and prescription medicines only as told by your health care provider.  Do not use medicines to help with diarrhea. General instructions  Wash your hands thoroughly before you prepare food and after you use the bathroom. Make sure people who live with you also wash their hands often.  Clean surfaces that you touch with a product that contains chlorine bleach.  Keep all follow-up visits as told by your health care provider. This is important. Contact a health care provider if:  Your symptoms do not get better with treatment.  Your symptoms get worse with treatment.  Your symptoms go away and then return.  You have a fever.  You have new symptoms. Get help right away if:  You have increasing pain or tenderness in your abdomen.  You have stool that is  mostly bloody, or your stool looks dark black and tarry.  You cannot eat or drink without vomiting.  You have signs of dehydration, such as:  Dark urine, very little urine, or no urine.  Cracked lips.  Not making tears when you cry.  Dry mouth.  Sunken eyes.  Sleepiness.  Weakness.  Dizziness. This information is not intended to replace advice given to you by your health care provider. Make sure you  discuss any questions you have with your health care provider. Document Released: 03/04/2005 Document Revised: 10/31/2015 Document Reviewed: 11/26/2014  2017 Elsevier

## 2016-07-28 ENCOUNTER — Telehealth: Payer: Self-pay | Admitting: Neurology

## 2016-07-28 ENCOUNTER — Encounter: Payer: Self-pay | Admitting: Internal Medicine

## 2016-07-28 NOTE — Telephone Encounter (Signed)
PT's spouse called and left a message regarding a medication/Dawn CB# [phone]

## 2016-07-29 NOTE — Telephone Encounter (Signed)
Patient's wife said that patient has not felt well lately.  She had him stop the mestinon Friday night and he has felt better since.  He went to his PCP and they put him back on the mestinon.  The symptoms have started again.   Is there anything else he can take?

## 2016-07-29 NOTE — Telephone Encounter (Signed)
I would first like him to try a lower dose of the Mestinon.  I would like him to try taking 30mg  three times daily. If he still does not feel well on it, then we may need to switch him to a low-dose steroid (prednisone).

## 2016-07-29 NOTE — Telephone Encounter (Signed)
Patient wife Inez Catalina called again and states that patient is having problems burning up and sick on tummy and head feels funny and very weak please call her today at [phone] they need to know what to do

## 2016-07-29 NOTE — Telephone Encounter (Signed)
Patient's wife given instructions and agreed with plan.  

## 2016-07-30 ENCOUNTER — Ambulatory Visit: Payer: Medicare HMO | Admitting: Internal Medicine

## 2016-08-03 ENCOUNTER — Telehealth: Payer: Self-pay | Admitting: Neurology

## 2016-08-03 NOTE — Telephone Encounter (Signed)
Called patient.  Wife said that he had just gotten into shower but will have him call me back.

## 2016-08-03 NOTE — Telephone Encounter (Signed)
[name] 09-28-2040. His # [phone].  Pyridostigmine medication is not working for him. He would like someone to please call him. Thank you

## 2016-08-04 ENCOUNTER — Other Ambulatory Visit: Payer: Self-pay | Admitting: Internal Medicine

## 2016-08-04 DIAGNOSIS — K559 Vascular disorder of intestine, unspecified: Secondary | ICD-10-CM

## 2016-08-04 MED ORDER — DICYCLOMINE HCL 10 MG PO CAPS: ORAL_CAPSULE | ORAL | 1 refills | Status: DC

## 2016-08-04 NOTE — Telephone Encounter (Signed)
Patient has stopped taking the mestinon.  It is making him feel bad.  Is there something else he can try?

## 2016-08-05 NOTE — Telephone Encounter (Signed)
Left message for patient to call me back. 

## 2016-08-05 NOTE — Telephone Encounter (Signed)
I would start prednisone 10mg  daily.  It's a steroid in order to keep the symptoms of myasthenia from worsening.  I recommended taking over the counter calcium-vitamin D supplement.  If he experiences stomach upset, I recommend taking over the counter Prilosec.

## 2016-08-07 ENCOUNTER — Encounter: Payer: Self-pay | Admitting: Cardiology

## 2016-08-07 NOTE — Telephone Encounter (Signed)
Mestinon and prednisone are the two primary medications for myasthenia gravis.  The next step would be immunosuppressive therapy.  I will have to discuss with Dr. Posey Pronto, our neuromuscular specialist, and we can get back to him on Monday

## 2016-08-07 NOTE — Telephone Encounter (Signed)
Patient can't take prednisone.  His face gets red, hot and itchy.  Any other advice?

## 2016-08-08 LAB — CUP PACEART REMOTE DEVICE CHECK
Date Time Interrogation Session: 20180203230829
MDC IDC PG IMPLANT DT: 20170807

## 2016-08-08 NOTE — Progress Notes (Signed)
Carelink summary report received. Battery status OK. Normal device function. No new symptom episodes, tachy episodes, brady, or pause episodes. No new AF episodes. Monthly summary reports and ROV/PRN 

## 2016-08-11 NOTE — Telephone Encounter (Signed)
I spoke with Dr. Posey Pronto.  I would like to refer him to her to evaluate on alternative medications

## 2016-08-11 NOTE — Telephone Encounter (Signed)
Did you decide what you want to give him?

## 2016-08-12 ENCOUNTER — Encounter: Payer: Medicare HMO | Admitting: *Deleted

## 2016-08-12 ENCOUNTER — Telehealth: Payer: Self-pay | Admitting: Neurology

## 2016-08-12 NOTE — Telephone Encounter (Signed)
PT called and said he has been going back and forth with his medication and needs to know what to do/Dawn CB#[phone]

## 2016-08-12 NOTE — Telephone Encounter (Signed)
Patient scheduled to see Dr. Posey Pronto.

## 2016-08-12 NOTE — Telephone Encounter (Signed)
Spoke with patient aware that he will see Dr. Posey Pronto

## 2016-08-13 ENCOUNTER — Encounter: Payer: Self-pay | Admitting: Cardiology

## 2016-08-13 NOTE — Progress Notes (Signed)
Cardiology Office Note   Date:  08/15/2016   ID:  Giomar, Gusler Aug 20, 1940, MRN [id number]  PCP:  Scarlette Calico, MD  Cardiologist:   Minus Breeding, MD    Chief Complaint  Patient presents with  . Chest Pain  . Cerebrovascular Accident      History of Present Illness: [name] is a 76 y.o. male who presents for follow up of chest pain.  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  He has been followed by Dr. Caryl Comes and has an ILR.  He has had nonobstructive disease in the distant past with Lexiscan Myoview in 2014 that was negative.    He is under significant stress because he has to do all of the housework. His wife has medical problems and chronic shoulder pain. He does all the chores including vacuuming. He actually says with this he does not get chest pressure, neck or arm discomfort. He doesn't notice palpitations, presyncope or syncope. He has no PND or orthopnea. He has diffuse leg weakness this is been chronic. He actually says he's recently been diagnosed with myasthenia.  Past Medical History:  Diagnosis Date  . Adenoma of left adrenal gland    stable per ct  . Coronary artery disease CARDIOLOGIST-- DR Rayann Heman   NON-OBSTRUCTIVE CAD--- 2005 Cath with 30% LAD, calcification/plaque of LAD, EF 60%  . DDD (degenerative disc disease), lumbosacral   . Depression   . Diverticulosis, sigmoid   . Eczema   . ED (erectile dysfunction)   . Encopresis   . GERD (gastroesophageal reflux disease)   . Hemorrhoids 2002  . History of duodenal ulcer   . History of esophageal dilatation    FOR STRICTURE 2008  . History of gallstones   . History of kidney stones 2014   right ureteral lithotripsy 01/2013  . History of peptic ulcer   . History of prostate cancer    S/P RADICAL PROSTATECTOMY 2009  . Hyperlipidemia   . Hypertension   . IBS (irritable bowel syndrome)   . Ischemic colitis ([place])   . OA (osteoarthritis)    RIGHT SHOULDER/ HIPS  . PAC (premature atrial contraction)     . Peyronie's disease   . Right ureteral stone   . Rotator cuff tear arthropathy of both shoulders     Past Surgical History:  Procedure Laterality Date  . CARDIAC CATHETERIZATION  07-23-2003   Non-obstructive CAD/  30% narrowing ostium LM,  30-40% eccentric calcified plaque LAD, 20% narrowing RCA/  preserved LVF  . CARDIOVASCULAR STRESS TEST  12-23-2012  DR ALLRED   Low risk lexiscan study with small mild intensity, fixed inferior defect consistent with thinning, no ischemia/  normal LVF and wall motion, ef 66%  . COLONOSCOPY  last one 06-19-2011  . CYSTOSCOPY W/ RETROGRADES Right 07/19/2014   Procedure: CYSTOSCOPY WITH RETROGRADE PYELOGRAM;  Surgeon: Malka So, MD;  Location: [facility];  Service: Urology;  Laterality: Right;  . CYSTOSCOPY/RETROGRADE/URETEROSCOPY/STONE EXTRACTION WITH BASKET Right 07/19/2014   Procedure: RIGHT URETEROSCOPY/STONE EXTRACTION WITH BASKET;  Surgeon: Malka So, MD;  Location: [facility];  Service: Urology;  Laterality: Right;  . EP IMPLANTABLE DEVICE N/A 01/13/2016   Procedure: Loop Recorder Insertion;  Surgeon: Deboraha Sprang, MD;  Location: [facility];  Service: Cardiovascular;  Laterality: N/A;  . ESOPHAGOGASTRODUODENOSCOPY (EGD) WITH ESOPHAGEAL DILATION  02-04-2001  . EXTRACORPOREAL SHOCK WAVE LITHOTRIPSY Right 01-30-2013  . FLEXIBLE SIGMOIDOSCOPY N/A 03/19/2014   Procedure: FLEXIBLE  SIGMOIDOSCOPY;  Surgeon: Inda Castle, MD;  Location: [facility];  Service: Endoscopy;  Laterality: N/A;  . INGUINAL HERNIA REPAIR Bilateral 03-17-2010  . LOOP RECORDER IMPLANT    . LUMBAR FUSION  03-26-2005   Revision laminectomy L5 - S1,  Laminectomy and Decompression L4-L5, fusion L4 -- S1  . LUMBAR LAMINECTOMY  1999   L5  -- S1  . NASAL SEPTUM SURGERY  YRS AGO  . RADICAL PROSTATECTOMY/ BILATERAL RETROPERITONEAL LYMPH NODE DISSECTION  12-29-2007  . REMOVAL BENIGN RIGHT EYELID LESION  12-19-2007  . SLING CYSTOURETHROPEXY  ([facility])  10-12-2009  . TONSILLECTOMY AND ADENOIDECTOMY  as child  . TRANSTHORACIC ECHOCARDIOGRAM  07-13-2014   mild LVH/  ef 60-655,  grade I diastolic dysfunction/  trivial AR, MR and TR/  mild LAE     Current Outpatient Prescriptions  Medication Sig Dispense Refill  . dicyclomine (BENTYL) 10 MG capsule TAKE 1 CAPSULE BY MOUTH THREE TIMES DAILY BEFORE MEALS 270 capsule 1  . diltiazem (TIAZAC) 360 MG 24 hr capsule Take 1 capsule (360 mg total) by mouth daily. 30 capsule 0  . fluticasone (FLONASE) 50 MCG/ACT nasal spray Use  As directed    . HYDROcodone-acetaminophen (NORCO/VICODIN) 5-325 MG tablet Take 1 tablet by mouth every 6 (six) hours as needed for moderate pain.    . Indacaterol-Glycopyrrolate (UTIBRON NEOHALER) 27.5-15.6 MCG CAPS Place 1 puff into inhaler and inhale 2 (two) times daily. 60 capsule 11  . metoprolol succinate (TOPROL-XL) 25 MG 24 hr tablet Take 1 tablet (25 mg total) by mouth 2 (two) times daily. 180 tablet 3  . omeprazole (PRILOSEC) 20 MG capsule Take 1 capsule (20 mg total) by mouth 2 (two) times daily. 180 capsule 3  . saccharomyces boulardii (FLORASTOR) 250 MG capsule Take 1 capsule (250 mg total) by mouth 2 (two) times daily. 60 capsule 1  . chlorthalidone (HYGROTON) 25 MG tablet Take 1 tablet (25 mg total) by mouth daily. 30 tablet 0   No current facility-administered medications for this visit.     Allergies:   Pseudoephedrine; Tape; Sulfa antibiotics; Sulfacetamide sodium; Lodine [etodolac]; and Oxycodone    ROS:  Please see the history of present illness.   Otherwise, review of systems are positive for none.   All other systems are reviewed and negative.    PHYSICAL EXAM: VS:  BP (!) 188/92   Pulse 90   Ht 5\' 6"  (1.676 m)   Wt 133 lb (60.3 kg)   BMI 21.47 kg/m  , BMI Body mass index is 21.47 kg/m. GENERAL:  Well appearing HEENT:  Pupils equal round and reactive, fundi not visualized, oral mucosa unremarkable, drooping eyelid.   NECK:  No jugular  venous distention, waveform within normal limits, carotid upstroke brisk and symmetric, no bruits, no thyromegaly LYMPHATICS:  No cervical, inguinal adenopathy LUNGS:  Clear to auscultation bilaterally BACK:  No CVA tenderness CHEST:  Unremarkable HEART:  PMI not displaced or sustained,S1 and S2 within normal limits, no S3, no S4, no clicks, no rubs, no murmurs ABD:  Flat, positive bowel sounds normal in frequency in pitch, mid positive bruit, no rebound, no guarding, no midline pulsatile mass, no hepatomegaly, no splenomegaly EXT:  2 plus pulses throughout, no edema, no cyanosis no clubbing SKIN:  No rashes no nodules NEURO:  Cranial nerves II through XII grossly intact, motor grossly intact throughout PSYCH:  Cognitively intact, oriented to person place and time    EKG:  EKG is not ordered today.   Recent Labs:  06/05/2016: ALT 35; BUN 27; Creatinine, Ser 0.80; Hemoglobin 15.0; Platelets 327; Potassium 4.3; Sodium 142    Lipid Panel    Component Value Date/Time   CHOL 142 08/07/2015 1159   TRIG 133.0 08/07/2015 1159   HDL 45.70 08/07/2015 1159   CHOLHDL 3 08/07/2015 1159   VLDL 26.6 08/07/2015 1159   LDLCALC 70 08/07/2015 1159   LDLDIRECT 134.5 08/22/2007 1137      Wt Readings from Last 3 Encounters:  08/14/16 133 lb (60.3 kg)  07/27/16 134 lb (60.8 kg)  07/02/16 132 lb (59.9 kg)      Other studies Reviewed: Additional studies/ records that were reviewed today include: Hospital records and loop recording. Review of the above records demonstrates:  Please see elsewhere in the note.     ASSESSMENT AND PLAN:  CRYPTOGENIC STROKE:  He has an ILR.  He's had no recent arrhythmias noted on this and I reviewed the most recent transmission.  PVCs/PACs:  He has no symptoms related to this. No change in therapy is indicated.  CHEST PAIN:  His chest pain is atypical and chronic. He does a lot of exertional activities and has no symptoms. I don't think further cardiovascular  testing is indicated.  HTN:  His blood pressure is not well controlled. I'll start chlorthalidone 25 mg daily. He'll get a basic metabolic profile in 2 weeks. He'll continue the other meds as listed.  Current medicines are reviewed at length with the patient today.  The patient does not have concerns regarding medicines.  The following changes have been made:  See above.  Labs/ tests ordered today include:   Orders Placed This Encounter  Procedures  . Basic Metabolic Panel (BMET)     Disposition:   FU with me six months.      Signed, Minus Breeding, MD  08/15/2016 1:43 PM    [place]

## 2016-08-14 ENCOUNTER — Encounter: Payer: Self-pay | Admitting: Cardiology

## 2016-08-14 ENCOUNTER — Ambulatory Visit (INDEPENDENT_AMBULATORY_CARE_PROVIDER_SITE_OTHER): Payer: Medicare HMO | Admitting: Cardiology

## 2016-08-14 VITALS — BP 188/92 | HR 90 | Ht 66.0 in | Wt 133.0 lb

## 2016-08-14 DIAGNOSIS — I10 Essential (primary) hypertension: Secondary | ICD-10-CM | POA: Diagnosis not present

## 2016-08-14 DIAGNOSIS — I639 Cerebral infarction, unspecified: Secondary | ICD-10-CM

## 2016-08-14 DIAGNOSIS — Z79899 Other long term (current) drug therapy: Secondary | ICD-10-CM

## 2016-08-14 MED ORDER — DILTIAZEM HCL ER BEADS 360 MG PO CP24: 360.0000 mg | ORAL_CAPSULE | Freq: Every day | ORAL | 3 refills | Status: DC

## 2016-08-14 MED ORDER — CHLORTHALIDONE 25 MG PO TABS: 25.0000 mg | ORAL_TABLET | Freq: Every day | ORAL | 3 refills | Status: DC

## 2016-08-14 MED ORDER — CHLORTHALIDONE 25 MG PO TABS: 25.0000 mg | ORAL_TABLET | Freq: Every day | ORAL | 0 refills | Status: DC

## 2016-08-14 MED ORDER — DILTIAZEM HCL ER BEADS 360 MG PO CP24: 360.0000 mg | ORAL_CAPSULE | Freq: Every day | ORAL | 0 refills | Status: AC

## 2016-08-14 NOTE — Patient Instructions (Signed)
Medication Instructions:  START- Chlorthalidone 25 mg daily  Labwork: BMP in 2 Weeks  Testing/Procedures: None Ordered  Follow-Up: Your physician wants you to follow-up in: 6 Months. You will receive a reminder letter in the mail two months in advance. If you don't receive a letter, please call our office to schedule the follow-up appointment.   Any Other Special Instructions Will Be Listed Below (If Applicable).   If you need a refill on your cardiac medications before your next appointment, please call your pharmacy.

## 2016-08-15 ENCOUNTER — Encounter: Payer: Self-pay | Admitting: Cardiology

## 2016-08-24 DIAGNOSIS — Z79899 Other long term (current) drug therapy: Secondary | ICD-10-CM | POA: Diagnosis not present

## 2016-08-24 LAB — BASIC METABOLIC PANEL
BUN: 18 mg/dL (ref 7–25)
CALCIUM: 10.2 mg/dL (ref 8.6–10.3)
CO2: 29 mmol/L (ref 20–31)
CREATININE: 0.95 mg/dL (ref 0.70–1.18)
Chloride: 101 mmol/L (ref 98–110)
Glucose, Bld: 110 mg/dL — ABNORMAL HIGH (ref 65–99)
Potassium: 3.9 mmol/L (ref 3.5–5.3)
Sodium: 139 mmol/L (ref 135–146)

## 2016-08-26 DIAGNOSIS — M7552 Bursitis of left shoulder: Secondary | ICD-10-CM | POA: Diagnosis not present

## 2016-08-26 DIAGNOSIS — G894 Chronic pain syndrome: Secondary | ICD-10-CM | POA: Diagnosis not present

## 2016-08-26 DIAGNOSIS — M542 Cervicalgia: Secondary | ICD-10-CM | POA: Diagnosis not present

## 2016-08-26 DIAGNOSIS — M7551 Bursitis of right shoulder: Secondary | ICD-10-CM | POA: Diagnosis not present

## 2016-09-09 ENCOUNTER — Ambulatory Visit (INDEPENDENT_AMBULATORY_CARE_PROVIDER_SITE_OTHER): Payer: Medicare HMO | Admitting: *Deleted

## 2016-09-09 DIAGNOSIS — I639 Cerebral infarction, unspecified: Secondary | ICD-10-CM | POA: Diagnosis not present

## 2016-09-10 NOTE — Progress Notes (Signed)
Carelink Summary Report / Loop Recorder 

## 2016-09-16 ENCOUNTER — Ambulatory Visit: Payer: Medicare HMO | Admitting: Neurology

## 2016-09-18 LAB — CUP PACEART REMOTE DEVICE CHECK
MDC IDC PG IMPLANT DT: 20170807
MDC IDC SESS DTM: 20180404233625

## 2016-09-24 ENCOUNTER — Encounter: Payer: Self-pay | Admitting: Internal Medicine

## 2016-09-24 ENCOUNTER — Ambulatory Visit (INDEPENDENT_AMBULATORY_CARE_PROVIDER_SITE_OTHER): Payer: Medicare HMO | Admitting: Internal Medicine

## 2016-09-24 VITALS — BP 142/70 | HR 72 | Temp 97.9°F | Resp 16 | Ht 66.0 in | Wt 131.1 lb

## 2016-09-24 DIAGNOSIS — J441 Chronic obstructive pulmonary disease with (acute) exacerbation: Secondary | ICD-10-CM

## 2016-09-24 DIAGNOSIS — Z720 Tobacco use: Secondary | ICD-10-CM | POA: Diagnosis not present

## 2016-09-24 MED ORDER — GLYCOPYRROLATE-FORMOTEROL 9-4.8 MCG/ACT IN AERO: 2.0000 | INHALATION_SPRAY | Freq: Two times a day (BID) | RESPIRATORY_TRACT | 3 refills | Status: DC

## 2016-09-24 NOTE — Progress Notes (Signed)
Pre visit review using our clinic review tool, if applicable. No additional management support is needed unless otherwise documented below in the visit note. 

## 2016-09-24 NOTE — Progress Notes (Signed)
Subjective:  Patient ID: [name], male    DOB: [birth year]  Age: 76 y.o. MRN: [id number]  CC: Cough   HPI [name] presents for concerns about a chronic cough that is rarely productive of clear phlegm with occasional shortness of breath and wheezing. He has been using the UTIBRON inhaler but he doesn't like the mechanics of the capsule device and wants to try a different inhaler.  Outpatient Medications Prior to Visit  Medication Sig Dispense Refill  . chlorthalidone (HYGROTON) 25 MG tablet Take 1 tablet (25 mg total) by mouth daily. 30 tablet 0  . dicyclomine (BENTYL) 10 MG capsule TAKE 1 CAPSULE BY MOUTH THREE TIMES DAILY BEFORE MEALS 270 capsule 1  . diltiazem (TIAZAC) 360 MG 24 hr capsule Take 1 capsule (360 mg total) by mouth daily. 30 capsule 0  . fluticasone (FLONASE) 50 MCG/ACT nasal spray Use  As directed    . HYDROcodone-acetaminophen (NORCO/VICODIN) 5-325 MG tablet Take 1 tablet by mouth every 6 (six) hours as needed for moderate pain.    . metoprolol succinate (TOPROL-XL) 25 MG 24 hr tablet Take 1 tablet (25 mg total) by mouth 2 (two) times daily. 180 tablet 3  . omeprazole (PRILOSEC) 20 MG capsule Take 1 capsule (20 mg total) by mouth 2 (two) times daily. 180 capsule 3  . saccharomyces boulardii (FLORASTOR) 250 MG capsule Take 1 capsule (250 mg total) by mouth 2 (two) times daily. 60 capsule 1  . Indacaterol-Glycopyrrolate (UTIBRON NEOHALER) 27.5-15.6 MCG CAPS Place 1 puff into inhaler and inhale 2 (two) times daily. 60 capsule 11   No facility-administered medications prior to visit.     ROS Review of Systems  Constitutional: Negative for chills, fatigue and fever.  HENT: Negative.  Negative for facial swelling, sore throat and trouble swallowing.   Eyes: Negative.   Respiratory: Positive for cough, shortness of breath and wheezing. Negative for choking, chest tightness and stridor.   Gastrointestinal: Negative for abdominal pain, constipation, diarrhea, nausea  and vomiting.  Endocrine: Negative.   Genitourinary: Negative.  Negative for difficulty urinating.  Musculoskeletal: Negative.  Negative for back pain and neck pain.  Skin: Negative.   Allergic/Immunologic: Negative.   Neurological: Negative.   Hematological: Negative for adenopathy. Does not bruise/bleed easily.  Psychiatric/Behavioral: Negative.     Objective:  BP (!) 142/70 (BP Location: Left Arm, Patient Position: Sitting, Cuff Size: Normal)   Pulse 72   Temp 97.9 F (36.6 C) (Oral)   Resp 16   Ht 5\' 6"  (1.676 m)   Wt 131 lb 1.9 oz (59.5 kg)   SpO2 97%   BMI 21.16 kg/m   BP Readings from Last 3 Encounters:  09/24/16 (!) 142/70  08/14/16 (!) 188/92  07/27/16 (!) 140/58    Wt Readings from Last 3 Encounters:  09/24/16 131 lb 1.9 oz (59.5 kg)  08/14/16 133 lb (60.3 kg)  07/27/16 134 lb (60.8 kg)    Physical Exam  Constitutional: He is oriented to person, place, and time. No distress.  HENT:  Mouth/Throat: Oropharynx is clear and moist. No oropharyngeal exudate.  Eyes: Conjunctivae are normal. Right eye exhibits no discharge. Left eye exhibits no discharge. No scleral icterus.  Neck: Normal range of motion. Neck supple. No JVD present. No tracheal deviation present. No thyromegaly present.  Cardiovascular: Normal rate, regular rhythm, normal heart sounds and intact distal pulses.  Exam reveals no gallop and no friction rub.   No murmur heard. Pulmonary/Chest: Effort normal. No accessory muscle  usage or stridor. No tachypnea. No respiratory distress. He has no decreased breath sounds. He has no wheezes. He has rhonchi in the right middle field and the left middle field. He has no rales. He exhibits no tenderness.  Abdominal: Soft. Bowel sounds are normal. He exhibits no distension and no mass. There is no tenderness. There is no rebound and no guarding.  Musculoskeletal: Normal range of motion. He exhibits no edema, tenderness or deformity.  Lymphadenopathy:    He has no  cervical adenopathy.  Neurological: He is oriented to person, place, and time.  Skin: Skin is warm and dry. No rash noted. He is not diaphoretic. No erythema. No pallor.  Vitals reviewed.   Lab Results  Component Value Date   WBC 15.2 (H) 06/05/2016   HGB 15.0 06/05/2016   HCT 44.0 06/05/2016   PLT 327 06/05/2016   GLUCOSE 110 (H) 08/24/2016   CHOL 142 08/07/2015   TRIG 133.0 08/07/2015   HDL 45.70 08/07/2015   LDLDIRECT 134.5 08/22/2007   LDLCALC 70 08/07/2015   ALT 35 06/05/2016   AST 22 06/05/2016   NA 139 08/24/2016   K 3.9 08/24/2016   CL 101 08/24/2016   CREATININE 0.95 08/24/2016   BUN 18 08/24/2016   CO2 29 08/24/2016   TSH 1.26 08/07/2015   PSA 6.69 (H) 08/22/2007   INR 1.01 06/05/2016    Dg Chest 2 View  Result Date: 06/30/2016 CLINICAL DATA:  Cough and nausea for 1 week. EXAM: CHEST  2 VIEW COMPARISON:  08/07/2014 FINDINGS: The heart size and mediastinal contours are within normal limits. Mild left basilar scarring. No evidence of pulmonary consolidation or edema. No evidence of pleural effusion or pneumothorax. Aortic atherosclerosis. Cardiac loop recorder noted. IMPRESSION: Mild left basilar scarring.  No active lung disease. Electronically Signed   By: Earle Gell M.D.   On: 06/30/2016 20:25    Assessment & Plan:   Emran was seen today for cough.  Diagnoses and all orders for this visit:  Obstructive chronic bronchitis with exacerbation ([place])- I think he should continue using a LAMA/LABA inhaler but will change to Bevespi. I gave him a sample of the inhaler and showed him how to use it. He demonstrated proficiency with its use. Will also screen for lung cancer with a limited CT scan. -     Ambulatory Referral for Lung Cancer Scre -     Glycopyrrolate-Formoterol (BEVESPI AEROSPHERE) 9-4.8 MCG/ACT AERO; Inhale 2 puffs into the lungs 2 (two) times daily.  Tobacco abuse disorder -     Ambulatory Referral for Lung Cancer Scre   I have discontinued Mr. Cefalu  Indacaterol-Glycopyrrolate. I am also having him start on Glycopyrrolate-Formoterol. Additionally, I am having him maintain his metoprolol succinate, HYDROcodone-acetaminophen, omeprazole, saccharomyces boulardii, dicyclomine, fluticasone, diltiazem, and chlorthalidone.  Meds ordered this encounter  Medications  . Glycopyrrolate-Formoterol (BEVESPI AEROSPHERE) 9-4.8 MCG/ACT AERO    Sig: Inhale 2 puffs into the lungs 2 (two) times daily.    Dispense:  32.1 g    Refill:  3     Follow-up: Return in about 3 months (around 12/24/2016).  Scarlette Calico, MD

## 2016-09-24 NOTE — Patient Instructions (Signed)
Chronic Obstructive Pulmonary Disease Chronic obstructive pulmonary disease (COPD) is a common lung condition in which airflow from the lungs is limited. COPD is a general term that can be used to describe many different lung problems that limit airflow, including both chronic bronchitis and emphysema. If you have COPD, your lung function will probably never return to normal, but there are measures you can take to improve lung function and make yourself feel better. What are the causes?  Smoking (common).  Exposure to secondhand smoke.  Genetic problems.  Chronic inflammatory lung diseases or recurrent infections. What are the signs or symptoms?  Shortness of breath, especially with physical activity.  Deep, persistent (chronic) cough with a large amount of thick mucus.  Wheezing.  Rapid breaths (tachypnea).  Gray or bluish discoloration (cyanosis) of the skin, especially in your fingers, toes, or lips.  Fatigue.  Weight loss.  Frequent infections or episodes when breathing symptoms become much worse (exacerbations).  Chest tightness. How is this diagnosed? Your health care provider will take a medical history and perform a physical examination to diagnose COPD. Additional tests for COPD may include:  Lung (pulmonary) function tests.  Chest X-ray.  CT scan.  Blood tests. How is this treated? Treatment for COPD may include:  Inhaler and nebulizer medicines. These help manage the symptoms of COPD and make your breathing more comfortable.  Supplemental oxygen. Supplemental oxygen is only helpful if you have a low oxygen level in your blood.  Exercise and physical activity. These are beneficial for nearly all people with COPD.  Lung surgery or transplant.  Nutrition therapy to gain weight, if you are underweight.  Pulmonary rehabilitation. This may involve working with a team of health care providers and specialists, such as respiratory, occupational, and physical  therapists. Follow these instructions at home:  Take all medicines (inhaled or pills) as directed by your health care provider.  Avoid over-the-counter medicines or cough syrups that dry up your airway (such as antihistamines) and slow down the elimination of secretions unless instructed otherwise by your health care provider.  If you are a smoker, the most important thing that you can do is stop smoking. Continuing to smoke will cause further lung damage and breathing trouble. Ask your health care provider for help with quitting smoking. He or she can direct you to community resources or hospitals that provide support.  Avoid exposure to irritants such as smoke, chemicals, and fumes that aggravate your breathing.  Use oxygen therapy and pulmonary rehabilitation if directed by your health care provider. If you require home oxygen therapy, ask your health care provider whether you should purchase a pulse oximeter to measure your oxygen level at home.  Avoid contact with individuals who have a contagious illness.  Avoid extreme temperature and humidity changes.  Eat healthy foods. Eating smaller, more frequent meals and resting before meals may help you maintain your strength.  Stay active, but balance activity with periods of rest. Exercise and physical activity will help you maintain your ability to do things you want to do.  Preventing infection and hospitalization is very important when you have COPD. Make sure to receive all the vaccines your health care provider recommends, especially the pneumococcal and influenza vaccines. Ask your health care provider whether you need a pneumonia vaccine.  Learn and use relaxation techniques to manage stress.  Learn and use controlled breathing techniques as directed by your health care provider. Controlled breathing techniques include: 1. Pursed lip breathing. Start by breathing in (inhaling)   through your nose for 1 second. Then, purse your lips as  if you were going to whistle and breathe out (exhale) through the pursed lips for 2 seconds. 2. Diaphragmatic breathing. Start by putting one hand on your abdomen just above your waist. Inhale slowly through your nose. The hand on your abdomen should move out. Then purse your lips and exhale slowly. You should be able to feel the hand on your abdomen moving in as you exhale.  Learn and use controlled coughing to clear mucus from your lungs. Controlled coughing is a series of short, progressive coughs. The steps of controlled coughing are: 1. Lean your head slightly forward. 2. Breathe in deeply using diaphragmatic breathing. 3. Try to hold your breath for 3 seconds. 4. Keep your mouth slightly open while coughing twice. 5. Spit any mucus out into a tissue. 6. Rest and repeat the steps once or twice as needed. Contact a health care provider if:  You are coughing up more mucus than usual.  There is a change in the color or thickness of your mucus.  Your breathing is more labored than usual.  Your breathing is faster than usual. Get help right away if:  You have shortness of breath while you are resting.  You have shortness of breath that prevents you from:  Being able to talk.  Performing your usual physical activities.  You have chest pain lasting longer than 5 minutes.  Your skin color is more cyanotic than usual.  You measure low oxygen saturations for longer than 5 minutes with a pulse oximeter. This information is not intended to replace advice given to you by your health care provider. Make sure you discuss any questions you have with your health care provider. Document Released: 03/04/2005 Document Revised: 10/31/2015 Document Reviewed: 01/19/2013 Elsevier Interactive Patient Education  2017 Elsevier Inc.  

## 2016-09-25 ENCOUNTER — Ambulatory Visit: Payer: Medicare HMO | Admitting: Neurology

## 2016-09-30 ENCOUNTER — Telehealth: Payer: Self-pay | Admitting: Internal Medicine

## 2016-09-30 NOTE — Telephone Encounter (Signed)
What was the nature of the "allergic reaction"?

## 2016-09-30 NOTE — Telephone Encounter (Signed)
Called and stated Pt may have allergic reaction to Glycopyrrolate-Formoterol (BEVESPI AEROSPHERE) 9-4.8 MCG/ACT AERO   Wanting to know if they should still dispense this drug.   Reference Number [id number]

## 2016-10-01 ENCOUNTER — Other Ambulatory Visit: Payer: Self-pay | Admitting: Acute Care

## 2016-10-01 DIAGNOSIS — F17210 Nicotine dependence, cigarettes, uncomplicated: Principal | ICD-10-CM

## 2016-10-01 NOTE — Telephone Encounter (Signed)
Called pt and he stated that he was not aware that he was having a reaction. Informed pt that I would contact the pharmacy.   Called pharmacy and informed of same. They will be dispensing the medication to patient.

## 2016-10-05 ENCOUNTER — Telehealth: Payer: Self-pay

## 2016-10-05 NOTE — Telephone Encounter (Signed)
PA not required.   Faxed letter to pharmacy informing of same.

## 2016-10-09 ENCOUNTER — Ambulatory Visit (INDEPENDENT_AMBULATORY_CARE_PROVIDER_SITE_OTHER): Payer: Medicare HMO | Admitting: Neurology

## 2016-10-09 ENCOUNTER — Other Ambulatory Visit: Payer: Medicare HMO

## 2016-10-09 ENCOUNTER — Encounter: Payer: Self-pay | Admitting: Neurology

## 2016-10-09 ENCOUNTER — Ambulatory Visit (INDEPENDENT_AMBULATORY_CARE_PROVIDER_SITE_OTHER): Payer: Medicare HMO | Admitting: *Deleted

## 2016-10-09 VITALS — BP 140/70 | HR 76 | Ht 66.0 in | Wt 132.6 lb

## 2016-10-09 DIAGNOSIS — G7001 Myasthenia gravis with (acute) exacerbation: Secondary | ICD-10-CM | POA: Diagnosis not present

## 2016-10-09 DIAGNOSIS — G629 Polyneuropathy, unspecified: Secondary | ICD-10-CM | POA: Diagnosis not present

## 2016-10-09 DIAGNOSIS — I639 Cerebral infarction, unspecified: Secondary | ICD-10-CM

## 2016-10-09 MED ORDER — PREDNISONE 10 MG PO TABS: 10.0000 mg | ORAL_TABLET | Freq: Every day | ORAL | 5 refills | Status: DC

## 2016-10-09 NOTE — Progress Notes (Signed)
[facility] Clinic Note - Initial Visit   Date: 10/09/16  THIJS BRUNTON MRN: [id number] DOB: May 06, 1941   Dear Dr. Tomi Likens:  Thank you for your kind referral of DANEN LAPAGLIA for consultation of myasthenia gravis. Although his history is well known to you, please allow [place] to reiterate it for the purpose of our medical record. The patient was accompanied to the clinic by daughter who also provides collateral information.     History of Present Illness: [name] is a 76 y.o. right-handed Caucasian male with hypertension, hyperlipidemia, CAD, GERD, current smoker, history of prostate cancer, chronic headache, lumbar radiculopathy, left MCA stroke, and myasthenia gravis referred for my opinion on management of myasthenia.  In mid-December 2017, patient woke up with right sided closure. He saw his ophthalmologist and was diagnosed with right ptosis without associated extraocular muscle weakness.  Later, he developed monocular double vision. He was evaluated by Dr. Tomi Likens who ordered MRI/A brain which returned normal. 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  He was started on Mestinon 60 mg 3 times daily however did not tolerate this due to GI side effects.  He was referred to see me for management options.    Upon further questioning, he reports having 5-year history of bilateral leg fatigue.  He gets tired walking, but does not need to stop to take breaks or rest.  He is still able to complete his tasks, but it takes more effort to do the same amount of work.  He denies difficulty with swallowing or talking.  He has chronic shortness of breath and is scheduled to have a CT of the chest later this month for cancer surveillance.  For the past 5-10 years, he has noticed difficulty with numbness of the feet. He denies any painful  paresthesias or weakness. He has noticed greater difficulty with imbalance, but has not had any falls. He walks unassisted. He denies history diabetes, alcohol, or family history of neuropathy.   Out-side paper records, electronic medical record, and images have been reviewed where available and summarized as:  MRI/A brain 06/05/2016:  1. No acute finding or explanation for symptoms. No mass, acute infarct, or aneurysm. 2. 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 Moderate area of remote left frontal cortex infarct.  Labs 06/11/2016:  AChR binding 11.10*, blocking 48, modulating 91   Labs TSH 1.26, vitamin B12 649, folate >23.7  Lab Results  Component Value Date   TSH 1.26 08/07/2015   Lab Results  Component Value Date   [id number] 09/30/2015     Past Medical History:  Diagnosis Date  . Adenoma of left adrenal gland    stable per ct  . Coronary artery disease CARDIOLOGIST-- DR Rayann Heman   NON-OBSTRUCTIVE CAD--- 2005 Cath with 30% LAD, calcification/plaque of LAD, EF 60%  . DDD (degenerative disc disease), lumbosacral   . Depression   . Diverticulosis, sigmoid   . Eczema   . ED (erectile dysfunction)   . Encopresis   . GERD (gastroesophageal reflux disease)   . Hemorrhoids 2002  . History of duodenal ulcer   . History of esophageal dilatation    FOR STRICTURE 2008  . History of gallstones   . History of kidney stones 2014   right ureteral lithotripsy 01/2013  . History of peptic ulcer   . History of prostate cancer  S/P RADICAL PROSTATECTOMY 2009  . Hyperlipidemia   . Hypertension   . IBS (irritable bowel syndrome)   . Ischemic colitis ([place])   . OA (osteoarthritis)    RIGHT SHOULDER/ HIPS  . PAC (premature atrial contraction)   . Peyronie's disease   . Right ureteral stone   . Rotator cuff tear arthropathy of both shoulders     Past Surgical History:  Procedure Laterality Date  . CARDIAC CATHETERIZATION  07-23-2003    Non-obstructive CAD/  30% narrowing ostium LM,  30-40% eccentric calcified plaque LAD, 20% narrowing RCA/  preserved LVF  . CARDIOVASCULAR STRESS TEST  12-23-2012  DR ALLRED   Low risk lexiscan study with small mild intensity, fixed inferior defect consistent with thinning, no ischemia/  normal LVF and wall motion, ef 66%  . COLONOSCOPY  last one 06-19-2011  . CYSTOSCOPY W/ RETROGRADES Right 07/19/2014   Procedure: CYSTOSCOPY WITH RETROGRADE PYELOGRAM;  Surgeon: Malka So, MD;  Location: [facility];  Service: Urology;  Laterality: Right;  . CYSTOSCOPY/RETROGRADE/URETEROSCOPY/STONE EXTRACTION WITH BASKET Right 07/19/2014   Procedure: RIGHT URETEROSCOPY/STONE EXTRACTION WITH BASKET;  Surgeon: Malka So, MD;  Location: [facility];  Service: Urology;  Laterality: Right;  . EP IMPLANTABLE DEVICE N/A 01/13/2016   Procedure: Loop Recorder Insertion;  Surgeon: Deboraha Sprang, MD;  Location: [facility];  Service: Cardiovascular;  Laterality: N/A;  . ESOPHAGOGASTRODUODENOSCOPY (EGD) WITH ESOPHAGEAL DILATION  02-04-2001  . EXTRACORPOREAL SHOCK WAVE LITHOTRIPSY Right 01-30-2013  . FLEXIBLE SIGMOIDOSCOPY N/A 03/19/2014   Procedure: FLEXIBLE SIGMOIDOSCOPY;  Surgeon: Inda Castle, MD;  Location: [facility];  Service: Endoscopy;  Laterality: N/A;  . INGUINAL HERNIA REPAIR Bilateral 03-17-2010  . LOOP RECORDER IMPLANT    . LUMBAR FUSION  03-26-2005   Revision laminectomy L5 - S1,  Laminectomy and Decompression L4-L5, fusion L4 -- S1  . LUMBAR LAMINECTOMY  1999   L5  -- S1  . NASAL SEPTUM SURGERY  YRS AGO  . RADICAL PROSTATECTOMY/ BILATERAL RETROPERITONEAL LYMPH NODE DISSECTION  12-29-2007  . REMOVAL BENIGN RIGHT EYELID LESION  12-19-2007  . SLING CYSTOURETHROPEXY (AdVANCE)  10-12-2009  . TONSILLECTOMY AND ADENOIDECTOMY  as child  . TRANSTHORACIC ECHOCARDIOGRAM  07-13-2014   mild LVH/  ef 60-655,  grade I diastolic dysfunction/  trivial AR, MR and TR/  mild LAE       Medications:  Outpatient Encounter Prescriptions as of 10/09/2016  Medication Sig  . chlorthalidone (HYGROTON) 25 MG tablet Take 1 tablet (25 mg total) by mouth daily.  Marland Kitchen dicyclomine (BENTYL) 10 MG capsule TAKE 1 CAPSULE BY MOUTH THREE TIMES DAILY BEFORE MEALS  . diltiazem (TIAZAC) 360 MG 24 hr capsule Take 1 capsule (360 mg total) by mouth daily.  . fluticasone (FLONASE) 50 MCG/ACT nasal spray Use  As directed  . Glycopyrrolate-Formoterol (BEVESPI AEROSPHERE) 9-4.8 MCG/ACT AERO Inhale 2 puffs into the lungs 2 (two) times daily.  Marland Kitchen HYDROcodone-acetaminophen (NORCO/VICODIN) 5-325 MG tablet Take 1 tablet by mouth every 6 (six) hours as needed for moderate pain.  . metoprolol succinate (TOPROL-XL) 25 MG 24 hr tablet Take 1 tablet (25 mg total) by mouth 2 (two) times daily.  Marland Kitchen omeprazole (PRILOSEC) 20 MG capsule Take 1 capsule (20 mg total) by mouth 2 (two) times daily.  Marland Kitchen saccharomyces boulardii (FLORASTOR) 250 MG capsule Take 1 capsule (250 mg total) by mouth 2 (two) times daily.  . predniSONE (DELTASONE) 10 MG tablet Take 1 tablet (10 mg total) by mouth daily with  breakfast.   No facility-administered encounter medications on file as of 10/09/2016.      Allergies:  Allergies  Allergen Reactions  . Pseudoephedrine Palpitations and Other (See Comments)    "Heart races"  . Tape Itching and Other (See Comments)    tears skin, can use paper tape  . Sulfa Antibiotics Itching  . Sulfacetamide Sodium Itching  . Lodine [Etodolac] Diarrhea  . Oxycodone Other (See Comments)    "over sedates"    Family History: Family History  Problem Relation Age of Onset  . Heart attack Father 26  . Aneurysm Father   . Bone cancer Maternal Grandfather   . Colon cancer Maternal Aunt 59  . Lymphoma Maternal Aunt     Social History: Social History  Substance Use Topics  . Smoking status: Current Every Day Smoker    Packs/day: 1.00    Years: 53.00    Types: Cigarettes  . Smokeless tobacco: Never  Used  . Alcohol use No   Social History   Social History Narrative   Lives with wife in an apartment.  Has 2 children.  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  Education: high school.    Review of Systems:  CONSTITUTIONAL: No fevers, chills, night sweats, or weight loss.   EYES: No visual changes or eye pain ENT: No hearing changes.  No history of nose bleeds.   RESPIRATORY: +cough, +wheezing and shortness of breath.   CARDIOVASCULAR: Negative for chest pain, and palpitations.   GI: Negative for abdominal discomfort, blood in stools or black stools.  No recent change in bowel habits.   GU:  No history of incontinence.   MUSCLOSKELETAL: +history of joint pain or swelling.  No myalgias.   SKIN: Negative for lesions, rash, and itching.   HEMATOLOGY/ONCOLOGY: Negative for prolonged bleeding, bruising easily, and swollen nodes.  No history of cancer.   ENDOCRINE: Negative for cold or heat intolerance, polydipsia or goiter.   PSYCH:  No depression or anxiety symptoms.   NEURO: As Above.   Vital Signs:  BP 140/70   Pulse 76   Ht 5\' 6"  (1.676 m)   Wt 132 lb 9 oz (60.1 kg)   BMI 21.40 kg/m    General Medical Exam:   General:  Well appearing, comfortable.   Eyes/ENT: see cranial nerve examination.   Neck: No masses appreciated.  Full range of motion without tenderness.  No carotid bruits. Respiratory:  There is expiratory wheezing bilaterally, coarse breath sounds.  Cardiac:  Regular rate and rhythm, no murmur.   Extremities:  No deformities, edema, or skin discoloration.  Skin:  No rashes or lesions.  Neurological Exam: MENTAL STATUS including orientation to time, place, person, recent and remote memory, attention span and concentration, language, and fund of knowledge is normal.  Speech is not dysarthric.  CRANIAL NERVES: II:  No visual field defects.  Unremarkable fundi.   III-IV-VI: Pupils equal round and reactive to light.  Right  up gaze is mildly restricted, although the extraocular movements are intact.   No nystagmus.  Right ptosis (moderate, fatigable with upgaze).   V:  Normal facial sensation.   VII:  Normal facial symmetry and movements.  No pathologic facial reflexes. Orbicularis oris, orbicularis oculi, and buccinator muscles are 5/5 VIII:  Normal hearing and vestibular function.   IX-X:  Normal palatal movement.   XI:  Normal shoulder shrug and head rotation.   XII:  Normal tongue strength and range  of motion, no deviation or fasciculation.  MOTOR: Motor strength is 5/5 throughout, there is no evidence of proximal muscle fatigability. Neck flexion is also 5/5. No atrophy, fasciculations or abnormal movements.  No pronator drift.  Tone is normal.    MSRs:  Right                                                                 Left brachioradialis 2+  brachioradialis 2+  biceps 2+  biceps 2+  triceps 2+  triceps 2+  patellar 3+  patellar 2+  ankle jerk 2+  ankle jerk 0  Hoffman no  Hoffman no  plantar response down  plantar response down   SENSORY:  Vibration is reduced distal to ankles bilaterally. Pinprick and temperature is also reduced below the lower legs and into the feet. There is mild sway with Romberg testing.   COORDINATION/GAIT: Normal finger-to- nose-finger.  Intact rapid alternating movements bilaterally.  Able to rise from a chair without using arms.  Gait mildly wide based and stable, unsteady with turning at times.  Stressed gait is intact. He is unsteady with performing tandem gait.   IMPRESSION/PLAN: 1.  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  - Patient did not tolerate mestinon due to GI side effects  - After long discussion regarding the disease and management options, he was willing to try low dose prednisone.  - I anticipate that he will also need to eventually be on a steroid-sparing agent, so will also  check TMPT  - Start prednisone 10mg  daily.  Side effects of chronic steroid use were discussed  - Start calcium supplements 1200 mg/day and vitamin D intake of 800 international units/day through either diet  - CT chest has been ordered as part of his lung cancer screening.  I will review this to also assess for thymoma  2.  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  Check copper and SPEP with IFE.  TSH, vitamin B12, and folate is within normal limits.  3.  Tobacco use.  Smoking cessation instruction/counseling given:  counseled patient on the dangers of tobacco use, advised patient to stop smoking, and reviewed strategies to maximize success  Return to clinic in 3 months.   The duration of this appointment visit was 60 minutes of face-to-face time with the patient.  Greater than 50% of this time was spent in counseling, explanation of diagnosis, planning of further management, and coordination of care.   Thank you for allowing me to participate in patient's care.  If I can answer any additional questions, I would be pleased to do so.    Sincerely,    Taylah Dubiel K. Posey Pronto, DO

## 2016-10-09 NOTE — Patient Instructions (Addendum)
1.  Start prednisone 10mg  daily.  If you develop side effects, call my office 2.  Start calcium supplements 1200 mg/day and vitamin D intake of 800 international units/day through either diet 3.  Avoid smoking and excessive alcohol  4.  Check labs 5.  Based on your labs, we will decide which medication to start for Myasthenia Gravis 6.  Call with an update in 3 weeks.  Return to clinic in 3 months

## 2016-10-12 NOTE — Progress Notes (Signed)
Carelink Summary Report / Loop Recorder 

## 2016-10-13 LAB — PROTEIN ELECTROPHORESIS, SERUM
Albumin ELP: 3.8 g/dL (ref 3.8–4.8)
Alpha-1-Globulin: 0.4 g/dL — ABNORMAL HIGH (ref 0.2–0.3)
Alpha-2-Globulin: 1 g/dL — ABNORMAL HIGH (ref 0.5–0.9)
Beta 2: 0.2 g/dL (ref 0.2–0.5)
Beta Globulin: 0.4 g/dL (ref 0.4–0.6)
Gamma Globulin: 0.9 g/dL (ref 0.8–1.7)
Total Protein, Serum Electrophoresis: 6.6 g/dL (ref 6.1–8.1)

## 2016-10-13 LAB — IMMUNOFIXATION ELECTROPHORESIS
IgA: <5 mg/dL — ABNORMAL LOW (ref 81–463)
IgG (Immunoglobin G), Serum: 1067 mg/dL (ref 694–1618)
IgM, Serum: 39 mg/dL — ABNORMAL LOW (ref 48–271)

## 2016-10-13 LAB — COPPER, SERUM: COPPER: 96 ug/dL (ref 70–175)

## 2016-10-15 LAB — THIOPURINE METHYLTRANSFERASE (TPMT), RBC: Thiopurine Methyltransferase, RBC: 19 nmol/hr/mL RBC

## 2016-10-16 ENCOUNTER — Other Ambulatory Visit: Payer: Self-pay | Admitting: Cardiology

## 2016-10-16 ENCOUNTER — Ambulatory Visit: Payer: Medicare HMO | Admitting: Neurology

## 2016-10-16 ENCOUNTER — Telehealth: Payer: Self-pay | Admitting: *Deleted

## 2016-10-16 NOTE — Telephone Encounter (Signed)
Gave patient results and instructions.   

## 2016-10-16 NOTE — Telephone Encounter (Signed)
Patient given results and instructions.   

## 2016-10-16 NOTE — Telephone Encounter (Signed)
-----   Message from Alda Berthold, DO sent at 10/16/2016 11:00 AM EDT ----- Please notify patient lab are within normal limits.  Please also see how he is tolerating prednisone and if it is helping.  Thank you.

## 2016-10-16 NOTE — Telephone Encounter (Signed)
-----   Message from Alda Berthold, DO sent at 10/16/2016 11:01 AM EDT ----- Actually, he started prednisone last week and it will be too early to appreciate changes. Please have them call with an update in 2 weeks, so I can decide whether to start azathioprine. Thanks.

## 2016-10-19 ENCOUNTER — Ambulatory Visit (INDEPENDENT_AMBULATORY_CARE_PROVIDER_SITE_OTHER)
Admission: RE | Admit: 2016-10-19 | Discharge: 2016-10-19 | Disposition: A | Discharge status: Home / Self Care | Payer: Medicare HMO | Source: Ambulatory Visit | Attending: Acute Care | Admitting: Acute Care

## 2016-10-19 ENCOUNTER — Encounter: Payer: Self-pay | Admitting: Acute Care

## 2016-10-19 ENCOUNTER — Ambulatory Visit (INDEPENDENT_AMBULATORY_CARE_PROVIDER_SITE_OTHER): Payer: Medicare HMO | Admitting: Acute Care

## 2016-10-19 DIAGNOSIS — F17210 Nicotine dependence, cigarettes, uncomplicated: Secondary | ICD-10-CM | POA: Diagnosis not present

## 2016-10-19 DIAGNOSIS — Z87891 Personal history of nicotine dependence: Secondary | ICD-10-CM

## 2016-10-19 DIAGNOSIS — Z136 Encounter for screening for cardiovascular disorders: Secondary | ICD-10-CM | POA: Diagnosis not present

## 2016-10-19 NOTE — Progress Notes (Addendum)
Shared Decision Making Visit Lung Cancer Screening Program [phone])   Eligibility:  Age 76 y.o.  Pack Years Smoking History Calculation 57 pack year smoker (# packs/per year x # years smoked)  Recent History of coughing up blood  no  Unexplained weight loss? no ( >Than 15 pounds within the last 6 months )  Prior History Lung / other cancer no (Diagnosis within the last 5 years already requiring surveillance chest CT Scans).  Smoking Status Current Smoker  Former Smokers: Years since quit:NA  Quit Date: NA  Visit Components:  Discussion included one or more decision making aids. yes  Discussion included risk/benefits of screening. yes  Discussion included potential follow up diagnostic testing for abnormal scans. yes  Discussion included meaning and risk of over diagnosis. yes  Discussion included meaning and risk of False Positives. yes  Discussion included meaning of total radiation exposure. yes  Counseling Included:  Importance of adherence to annual lung cancer LDCT screening. yes  Impact of comorbidities on ability to participate in the program. yes  Ability and willingness to under diagnostic treatment. yes  Smoking Cessation Counseling:  Current Smokers:   Discussed importance of smoking cessation. yes  Information about tobacco cessation classes and interventions provided to patient. yes  Patient provided with "ticket" for LDCT Scan. yes  Symptomatic Patient. no  CounselingNA  Diagnosis Code: Tobacco Use Z72.0  Asymptomatic Patient yes  Counseling (Intermediate counseling: > three minutes counseling) G8676  Former Smokers:   Discussed the importance of maintaining cigarette abstinence. yes  Diagnosis Code: Personal History of Nicotine Dependence. P95.093  Information about tobacco cessation classes and interventions provided to patient. Yes  Patient provided with "ticket" for LDCT Scan. yes  Written Order for Lung Cancer Screening with  LDCT placed in Epic. Yes (CT Chest Lung Cancer Screening Low Dose W/O CM) [id number] Z12.2-Screening of respiratory organs Z87.891-Personal history of nicotine dependence  I have spent 25 minutes of face to face time with [name] and [name]  discussing the risks and benefits of lung cancer screening. We viewed a power point together that explained in detail the above noted topics. We paused at intervals to allow for questions to be asked and answered to ensure understanding.We discussed that the single most powerful action that he can take to decrease Hunter  risk of developing lung cancer is to quit smoking. We discussed whether or not he  is ready to commit to setting a quit date. He is currently not ready to set a quit date. We discussed options for tools to aid in quitting smoking including nicotine replacement therapy, non-nicotine medications, support groups, Quit Smart classes, and behavior modification. We discussed that often times setting smaller, more achievable goals, such as eliminating 1 cigarette a day for a week and then 2 cigarettes a day for a week can be helpful in slowly decreasing the number of cigarettes smoked. This allows for a sense of accomplishment as well as providing a clinical benefit. I gave him the " Be Stronger Than Your Excuses" card with contact information for community resources, classes, free nicotine replacement therapy, and access to mobile apps, text messaging, and on-line smoking cessation help. I have also given him my card and contact information in the event he needs to contact me. We discussed the time and location of the scan, and that either Doroteo Glassman RN or I will call with the results within 24-48 hours of receiving them. I have offered [name]  a copy of the  power point we viewed  as a resource in the event they need reinforcement of the concepts we discussed today in the office. The patient verbalized understanding of all of  the above and had no further  questions upon leaving the office. They have my contact information in the event they have any further questions.  I spent 4  minutes counseling on smoking cessation and the health risks of continued tobacco abuse.  I explained to the patient that there has been a high incidence of coronary artery disease noted on these exams. I explained that this is a non-gated exam therefore degree or severity cannot be determined. This patient is currently not  on statin therapy. I have asked the patient to follow-up with their PCP regarding any incidental finding of coronary artery disease and management with diet or medication as their PCP  feels is clinically indicated. The patient verbalized understanding of the above and had no further questions upon completion of the visit.      Magdalen Spatz, NP 10/19/2016

## 2016-10-22 ENCOUNTER — Other Ambulatory Visit: Payer: Self-pay | Admitting: Acute Care

## 2016-10-22 DIAGNOSIS — F17210 Nicotine dependence, cigarettes, uncomplicated: Principal | ICD-10-CM

## 2016-10-22 LAB — CUP PACEART REMOTE DEVICE CHECK
Date Time Interrogation Session: 20180505000810
Implantable Pulse Generator Implant Date: 20170807

## 2016-10-27 ENCOUNTER — Telehealth: Payer: Self-pay | Admitting: Neurology

## 2016-10-27 NOTE — Telephone Encounter (Signed)
Left message giving patient instructions to stop prednisone and to call for a follow up appointment.

## 2016-10-27 NOTE — Telephone Encounter (Signed)
Ok to stop prednisone.  Please have him set up a follow-up visit to discuss alternative medications.

## 2016-10-27 NOTE — Telephone Encounter (Signed)
Please advise 

## 2016-10-27 NOTE — Telephone Encounter (Signed)
Caller: Sonia Side   Urgent? Yes  Reason for the call: He has been having some side effects from the Prednisone that he was put on. He said he would like to come off of it. He said he has been feeling sick and weak. He also said he is having a hard time swallowing. He said his face is red and he feels hot. Please Advise. Thanks

## 2016-11-03 ENCOUNTER — Ambulatory Visit (INDEPENDENT_AMBULATORY_CARE_PROVIDER_SITE_OTHER): Payer: Medicare HMO | Admitting: Neurology

## 2016-11-03 ENCOUNTER — Encounter: Payer: Self-pay | Admitting: Neurology

## 2016-11-03 VITALS — BP 160/90 | HR 90 | Ht 66.0 in | Wt 137.1 lb

## 2016-11-03 DIAGNOSIS — G7001 Myasthenia gravis with (acute) exacerbation: Secondary | ICD-10-CM | POA: Diagnosis not present

## 2016-11-03 DIAGNOSIS — H4901 Third [oculomotor] nerve palsy, right eye: Secondary | ICD-10-CM | POA: Diagnosis not present

## 2016-11-03 MED ORDER — PREDNISONE 10 MG PO TABS: 15.0000 mg | ORAL_TABLET | Freq: Every day | ORAL | 5 refills | Status: DC

## 2016-11-03 MED ORDER — AZATHIOPRINE 50 MG PO TABS: ORAL_TABLET | ORAL | 3 refills | Status: DC

## 2016-11-03 NOTE — Patient Instructions (Addendum)
1.  Check labs weekly x 4, then monthly x 3, then every 3 months 2.  Start azathioprine 50mg  daily for two weeks, then increase to 100mg  (2 tablets) daily 3.  Increase prednisone to 15mg  daily  Return to clinic in on August 10th at 11:30am.

## 2016-11-03 NOTE — Progress Notes (Signed)
Follow-up Visit   Date: 11/03/16    [name] MRN: [id number] DOB: 11-19-1940   Interim History: [name] is a 76 y.o. right-handed Caucasian male with hypertension, hyperlipidemia, CAD, GERD, current smoker, history of prostate cancer, chronic headache, lumbar radiculopathy, left MCA stroke, and myasthenia gravis returning to the clinic for follow-up of myasthenia gravis.  The patient was accompanied to the clinic by self.  History of present illness: In mid-December 2017, patient woke up with right sided closure. He saw his ophthalmologist and was diagnosed with right ptosis without associated extraocular muscle weakness.  Later, he developed monocular double vision. He was evaluated by Dr. Tomi Likens who ordered MRI/A brain which returned normal. 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  He was started on Mestinon 60 mg 3 times daily however did not tolerate this due to GI side effects.  He was referred to see me for management options.    Upon further questioning, he reports having 5-year history of bilateral leg fatigue.  He gets tired walking, but does not need to stop to take breaks or rest.  He is still able to complete his tasks, but it takes more effort to do the same amount of work.  He denies difficulty with swallowing or talking.  He has chronic shortness of breath and is scheduled to have a CT of the chest later this month for cancer surveillance.  For the past 5-10 years, he has noticed difficulty with numbness of the feet. He denies any painful paresthesias or weakness. He has noticed greater difficulty with imbalance, but has not had any falls. He walks unassisted.   UPDATE 11/03/2016:  Patient scheduled a sooner appointment because he was unable to tolerate prednisone and states he made him tired and did not appreciate any  improvement with his double vision, but only took it for about 2 weeks.  He continues to drive with one eye closed because of his double vision.  He only notices double vision when driving and seeing the road markings, but does not notice this when watching TV. He also complains of halos around car lights at night time. He also sees an image described as a white mitten in his visual field which is intermittent.  He is scheduled to see his eye doctor today.  No new difficulty with swallowing, talking, shortness of breath, or limb weakness.  He has generalized fatigue which is worse in the morning and states that he does not sleep well, waking up about 4 times every night.   Medications:  Current Outpatient Prescriptions on File Prior to Visit  Medication Sig Dispense Refill  . chlorthalidone (HYGROTON) 25 MG tablet TAKE 1 TABLET(25 MG) BY MOUTH DAILY 30 tablet 5  . dicyclomine (BENTYL) 10 MG capsule TAKE 1 CAPSULE BY MOUTH THREE TIMES DAILY BEFORE MEALS 270 capsule 1  . diltiazem (TIAZAC) 360 MG 24 hr capsule Take 1 capsule (360 mg total) by mouth daily. 30 capsule 0  . fluticasone (FLONASE) 50 MCG/ACT nasal spray Use  As directed    . Glycopyrrolate-Formoterol (BEVESPI AEROSPHERE) 9-4.8 MCG/ACT AERO Inhale 2 puffs into the lungs 2 (two) times daily. 32.1 g 3  . HYDROcodone-acetaminophen (NORCO/VICODIN) 5-325 MG tablet Take 1 tablet by mouth every 6 (six) hours as needed for moderate pain.    . metoprolol succinate (TOPROL-XL) 25 MG 24 hr tablet Take 1 tablet (  25 mg total) by mouth 2 (two) times daily. 180 tablet 3  . omeprazole (PRILOSEC) 20 MG capsule Take 1 capsule (20 mg total) by mouth 2 (two) times daily. 180 capsule 3  . saccharomyces boulardii (FLORASTOR) 250 MG capsule Take 1 capsule (250 mg total) by mouth 2 (two) times daily. 60 capsule 1   No current facility-administered medications on file prior to visit.     Allergies:  Allergies  Allergen Reactions  . Pseudoephedrine Palpitations  and Other (See Comments)    "Heart races"  . Tape Itching and Other (See Comments)    tears skin, can use paper tape  . Sulfa Antibiotics Itching  . Sulfacetamide Sodium Itching  . Lodine [Etodolac] Diarrhea  . Oxycodone Other (See Comments)    "over sedates"    Review of Systems:  CONSTITUTIONAL: No fevers, chills, night sweats, or weight loss.  EYES: +visual changes or eye pain ENT: No hearing changes.  No history of nose bleeds.   RESPIRATORY: No cough, wheezing and shortness of breath.   CARDIOVASCULAR: Negative for chest pain, and palpitations.   GI: Negative for abdominal discomfort, blood in stools or black stools.  No recent change in bowel habits.   GU:  No history of incontinence.   MUSCLOSKELETAL: No history of joint pain or swelling.  No myalgias.   SKIN: Negative for lesions, rash, and itching.   ENDOCRINE: Negative for cold or heat intolerance, polydipsia or goiter.   PSYCH:  + depression or anxiety symptoms.   NEURO: As Above.   Vital Signs:  BP (!) 160/90   Pulse 90   Ht 5\' 6"  (1.676 m)   Wt 137 lb 2 oz (62.2 kg)   SpO2 98%   BMI 22.13 kg/m   Neurological Exam: MENTAL STATUS including orientation to time, place, person, recent and remote memory, attention span and concentration, language, and fund of knowledge is normal.  Speech is not dysarthric.  CRANIAL NERVES: Pupils equal round and reactive to light.  Normal conjugate, extra-ocular eye movements in all directions of gaze, except mildly restricted right upgaze.  Mild right ptosis worse with sustained upgaze. Normal facial sensation.  Face is symmetric. Palate elevates symmetrically.  Tongue is midline.  MOTOR:  Motor strength is 5/5 in all extremities, including neck flexion.  No atrophy, fasciculations or abnormal movements.   COORDINATION/GAIT:  Gait narrow based and stable.   Data: MRI/A brain 06/05/2016:  1. No acute finding or explanation for symptoms. No mass, acute infarct, or aneurysm. 2.  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 Moderate area of remote left frontal cortex infarct.  Labs 06/11/2016:  AChR binding 11.10*, blocking 48, modulating 91  Labs TSH 1.26, vitamin B12 649, folate >23.7 Labs 10/09/2016:  Copper 96, SPEP with IFE no M protein, TMPT normal  CT chest 10/19/2016:  No thymoma  IMPRESSION/PLAN: 1.  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              - Patient did not tolerate mestinon due to GI side effects     - He self discontinued prednisone but only took this for 2 weeks which may be too early to appreciate any benefit   - He is agreeable to increasing prednisone 15mg  daily   - Start azathioprine 50mg  daily x 2 weeks, then increase to 100mg  daily as a steroid-sparing medication.  Risks and benefits discussed   - Check CBC and CMP  weekly x 4, then monthly x3, then every 3 months   - Continuet calcium supplements 1200 mg/day and vitamin D intake of 800 international units/day through either diet  2.   Tobacco use.  Smoking cessation instruction/counseling given:  counseled patient on the dangers of tobacco use, advised patient to stop smoking, and reviewed strategies to maximize success   The duration of this appointment visit was 25 minutes of face-to-face time with the patient.  Greater than 50% of this time was spent in counseling, explanation of diagnosis, planning of further management, and coordination of care.   Thank you for allowing me to participate in patient's care.  If I can answer any additional questions, I would be pleased to do so.    Sincerely,    Eleena Grater K. Posey Pronto, DO

## 2016-11-04 ENCOUNTER — Other Ambulatory Visit (INDEPENDENT_AMBULATORY_CARE_PROVIDER_SITE_OTHER): Payer: Medicare HMO

## 2016-11-04 DIAGNOSIS — G7001 Myasthenia gravis with (acute) exacerbation: Secondary | ICD-10-CM | POA: Diagnosis not present

## 2016-11-04 LAB — CBC WITH DIFFERENTIAL/PLATELET
Basophils Absolute: 0.1 10*3/uL (ref 0.0–0.1)
Basophils Relative: 0.6 % (ref 0.0–3.0)
EOS PCT: 1 % (ref 0.0–5.0)
Eosinophils Absolute: 0.1 10*3/uL (ref 0.0–0.7)
HCT: 45.8 % (ref 39.0–52.0)
Hemoglobin: 15.4 g/dL (ref 13.0–17.0)
LYMPHS ABS: 1.7 10*3/uL (ref 0.7–4.0)
Lymphocytes Relative: 12.1 % (ref 12.0–46.0)
MCHC: 33.6 g/dL (ref 30.0–36.0)
MCV: 95.9 fl (ref 78.0–100.0)
MONO ABS: 0.8 10*3/uL (ref 0.1–1.0)
Monocytes Relative: 5.6 % (ref 3.0–12.0)
NEUTROS PCT: 80.7 % — AB (ref 43.0–77.0)
Neutro Abs: 11.7 10*3/uL — ABNORMAL HIGH (ref 1.4–7.7)
PLATELETS: 305 10*3/uL (ref 150.0–400.0)
RBC: 4.77 Mil/uL (ref 4.22–5.81)
RDW: 14.4 % (ref 11.5–15.5)
WBC: 14.4 10*3/uL — ABNORMAL HIGH (ref 4.0–10.5)

## 2016-11-04 LAB — COMPREHENSIVE METABOLIC PANEL
ALK PHOS: 80 U/L (ref 39–117)
ALT: 27 U/L (ref 0–53)
AST: 19 U/L (ref 0–37)
Albumin: 4.3 g/dL (ref 3.5–5.2)
BILIRUBIN TOTAL: 0.4 mg/dL (ref 0.2–1.2)
BUN: 16 mg/dL (ref 6–23)
CO2: 29 mEq/L (ref 19–32)
Calcium: 10.1 mg/dL (ref 8.4–10.5)
Chloride: 106 mEq/L (ref 96–112)
Creatinine, Ser: 0.85 mg/dL (ref 0.40–1.50)
GFR: 92.89 mL/min (ref >60.00)
GLUCOSE: 109 mg/dL — AB (ref 70–99)
POTASSIUM: 3.9 meq/L (ref 3.5–5.1)
SODIUM: 140 meq/L (ref 135–145)
TOTAL PROTEIN: 7.4 g/dL (ref 6.0–8.3)

## 2016-11-05 ENCOUNTER — Telehealth: Payer: Self-pay | Admitting: *Deleted

## 2016-11-05 NOTE — Telephone Encounter (Signed)
-----   Message from Alda Berthold, DO sent at 11/05/2016  2:55 PM EDT ----- Please notify patient lab are within normal limits.  His white count is a little elevated - does he have any signs of infection such as fever, chills, dysuria?  If not, we will keep an eye on it and recheck in a week.  Thank you.

## 2016-11-05 NOTE — Telephone Encounter (Signed)
Patient given results.  He is having no signs or symptoms of infection at this time.

## 2016-11-09 ENCOUNTER — Ambulatory Visit (INDEPENDENT_AMBULATORY_CARE_PROVIDER_SITE_OTHER): Payer: Medicare HMO | Admitting: *Deleted

## 2016-11-09 DIAGNOSIS — I639 Cerebral infarction, unspecified: Secondary | ICD-10-CM

## 2016-11-09 NOTE — Progress Notes (Signed)
Remote pacemaker transmission.   

## 2016-11-11 ENCOUNTER — Other Ambulatory Visit (INDEPENDENT_AMBULATORY_CARE_PROVIDER_SITE_OTHER): Payer: Medicare HMO

## 2016-11-11 ENCOUNTER — Other Ambulatory Visit: Payer: Self-pay | Admitting: *Deleted

## 2016-11-11 DIAGNOSIS — Z79899 Other long term (current) drug therapy: Secondary | ICD-10-CM | POA: Diagnosis not present

## 2016-11-11 LAB — COMPREHENSIVE METABOLIC PANEL
ALBUMIN: 4.1 g/dL (ref 3.5–5.2)
ALK PHOS: 73 U/L (ref 39–117)
ALT: 50 U/L (ref 0–53)
AST: 28 U/L (ref 0–37)
BUN: 34 mg/dL — AB (ref 6–23)
CHLORIDE: 99 meq/L (ref 96–112)
CO2: 33 mEq/L — ABNORMAL HIGH (ref 19–32)
Calcium: 10.6 mg/dL — ABNORMAL HIGH (ref 8.4–10.5)
Creatinine, Ser: 0.98 mg/dL (ref 0.40–1.50)
GFR: 78.82 mL/min (ref >60.00)
Glucose, Bld: 111 mg/dL — ABNORMAL HIGH (ref 70–99)
POTASSIUM: 4.5 meq/L (ref 3.5–5.1)
SODIUM: 136 meq/L (ref 135–145)
Total Bilirubin: 0.3 mg/dL (ref 0.2–1.2)
Total Protein: 7.3 g/dL (ref 6.0–8.3)

## 2016-11-11 LAB — CBC WITH DIFFERENTIAL/PLATELET
BASOS ABS: 0.1 10*3/uL (ref 0.0–0.1)
BASOS PCT: 0.4 % (ref 0.0–3.0)
EOS ABS: 0.1 10*3/uL (ref 0.0–0.7)
Eosinophils Relative: 0.8 % (ref 0.0–5.0)
HEMATOCRIT: 46.8 % (ref 39.0–52.0)
HEMOGLOBIN: 15.6 g/dL (ref 13.0–17.0)
LYMPHS PCT: 15 % (ref 12.0–46.0)
Lymphs Abs: 2 10*3/uL (ref 0.7–4.0)
MCHC: 33.4 g/dL (ref 30.0–36.0)
MCV: 96 fl (ref 78.0–100.0)
Monocytes Absolute: 0.7 10*3/uL (ref 0.1–1.0)
Monocytes Relative: 5 % (ref 3.0–12.0)
Neutro Abs: 10.8 10*3/uL — ABNORMAL HIGH (ref 1.4–7.7)
Neutrophils Relative %: 78.8 % — ABNORMAL HIGH (ref 43.0–77.0)
Platelets: 361 10*3/uL (ref 150.0–400.0)
RBC: 4.88 Mil/uL (ref 4.22–5.81)
RDW: 14.5 % (ref 11.5–15.5)
WBC: 13.7 10*3/uL — AB (ref 4.0–10.5)

## 2016-11-11 LAB — CUP PACEART REMOTE DEVICE CHECK
Date Time Interrogation Session: 20180604001239
Implantable Pulse Generator Implant Date: 20170807

## 2016-11-12 ENCOUNTER — Other Ambulatory Visit: Payer: Self-pay | Admitting: *Deleted

## 2016-11-12 ENCOUNTER — Telehealth: Payer: Self-pay | Admitting: *Deleted

## 2016-11-12 DIAGNOSIS — Z79899 Other long term (current) drug therapy: Secondary | ICD-10-CM

## 2016-11-12 NOTE — Telephone Encounter (Signed)
-----   Message from Alda Berthold, DO sent at 11/12/2016  8:38 AM EDT ----- Labs are stable, recheck in 1 week.

## 2016-11-12 NOTE — Telephone Encounter (Signed)
Patient given results and instructions.   

## 2016-11-17 ENCOUNTER — Other Ambulatory Visit (INDEPENDENT_AMBULATORY_CARE_PROVIDER_SITE_OTHER): Payer: Medicare HMO

## 2016-11-17 DIAGNOSIS — Z79899 Other long term (current) drug therapy: Secondary | ICD-10-CM

## 2016-11-17 LAB — COMPREHENSIVE METABOLIC PANEL
ALBUMIN: 3.9 g/dL (ref 3.5–5.2)
ALT: 38 U/L (ref 0–53)
AST: 20 U/L (ref 0–37)
Alkaline Phosphatase: 66 U/L (ref 39–117)
BUN: 30 mg/dL — ABNORMAL HIGH (ref 6–23)
CALCIUM: 9.9 mg/dL (ref 8.4–10.5)
CO2: 30 mEq/L (ref 19–32)
Chloride: 102 mEq/L (ref 96–112)
Creatinine, Ser: 0.82 mg/dL (ref 0.40–1.50)
GFR: 96.82 mL/min (ref >60.00)
Glucose, Bld: 126 mg/dL — ABNORMAL HIGH (ref 70–99)
POTASSIUM: 3.6 meq/L (ref 3.5–5.1)
Sodium: 138 mEq/L (ref 135–145)
Total Bilirubin: 0.3 mg/dL (ref 0.2–1.2)
Total Protein: 6.8 g/dL (ref 6.0–8.3)

## 2016-11-17 LAB — CBC WITH DIFFERENTIAL/PLATELET
BASOS PCT: 0.6 % (ref 0.0–3.0)
Basophils Absolute: 0.1 10*3/uL (ref 0.0–0.1)
EOS PCT: 1.1 % (ref 0.0–5.0)
Eosinophils Absolute: 0.1 10*3/uL (ref 0.0–0.7)
HEMATOCRIT: 44.4 % (ref 39.0–52.0)
HEMOGLOBIN: 15 g/dL (ref 13.0–17.0)
Lymphocytes Relative: 11.1 % — ABNORMAL LOW (ref 12.0–46.0)
Lymphs Abs: 1.4 10*3/uL (ref 0.7–4.0)
MCHC: 33.8 g/dL (ref 30.0–36.0)
MCV: 95.6 fl (ref 78.0–100.0)
MONO ABS: 0.7 10*3/uL (ref 0.1–1.0)
Monocytes Relative: 5.7 % (ref 3.0–12.0)
NEUTROS ABS: 10.5 10*3/uL — AB (ref 1.4–7.7)
Neutrophils Relative %: 81.5 % — ABNORMAL HIGH (ref 43.0–77.0)
Platelets: 316 10*3/uL (ref 150.0–400.0)
RBC: 4.64 Mil/uL (ref 4.22–5.81)
RDW: 14.2 % (ref 11.5–15.5)
WBC: 12.8 10*3/uL — AB (ref 4.0–10.5)

## 2016-11-18 ENCOUNTER — Telehealth: Payer: Self-pay | Admitting: *Deleted

## 2016-11-18 ENCOUNTER — Other Ambulatory Visit: Payer: Self-pay | Admitting: *Deleted

## 2016-11-18 DIAGNOSIS — Z79899 Other long term (current) drug therapy: Secondary | ICD-10-CM

## 2016-11-18 NOTE — Telephone Encounter (Signed)
Patient notified and will be back next week for labs again.

## 2016-11-18 NOTE — Telephone Encounter (Signed)
-----   Message from Alda Berthold, DO sent at 11/18/2016  9:21 AM EDT ----- Please notify patient lab are within normal limits, recheck in 1 week Thank you.

## 2016-11-24 ENCOUNTER — Other Ambulatory Visit: Payer: Self-pay | Admitting: *Deleted

## 2016-11-24 ENCOUNTER — Telehealth: Payer: Self-pay | Admitting: *Deleted

## 2016-11-24 ENCOUNTER — Other Ambulatory Visit (INDEPENDENT_AMBULATORY_CARE_PROVIDER_SITE_OTHER): Payer: Medicare HMO

## 2016-11-24 ENCOUNTER — Telehealth: Payer: Self-pay | Admitting: Neurology

## 2016-11-24 DIAGNOSIS — Z79899 Other long term (current) drug therapy: Secondary | ICD-10-CM

## 2016-11-24 LAB — CBC WITH DIFFERENTIAL/PLATELET
BASOS PCT: 0.6 % (ref 0.0–3.0)
Basophils Absolute: 0.1 10*3/uL (ref 0.0–0.1)
EOS PCT: 1.2 % (ref 0.0–5.0)
Eosinophils Absolute: 0.2 10*3/uL (ref 0.0–0.7)
HCT: 44.8 % (ref 39.0–52.0)
Hemoglobin: 15.2 g/dL (ref 13.0–17.0)
LYMPHS ABS: 2.8 10*3/uL (ref 0.7–4.0)
Lymphocytes Relative: 21.2 % (ref 12.0–46.0)
MCHC: 33.9 g/dL (ref 30.0–36.0)
MCV: 95.4 fl (ref 78.0–100.0)
MONO ABS: 0.5 10*3/uL (ref 0.1–1.0)
Monocytes Relative: 4.1 % (ref 3.0–12.0)
NEUTROS ABS: 9.5 10*3/uL — AB (ref 1.4–7.7)
NEUTROS PCT: 72.9 % (ref 43.0–77.0)
PLATELETS: 316 10*3/uL (ref 150.0–400.0)
RBC: 4.69 Mil/uL (ref 4.22–5.81)
RDW: 14.6 % (ref 11.5–15.5)
WBC: 13 10*3/uL — ABNORMAL HIGH (ref 4.0–10.5)

## 2016-11-24 LAB — COMPREHENSIVE METABOLIC PANEL
ALT: 30 U/L (ref 0–53)
AST: 19 U/L (ref 0–37)
Albumin: 4 g/dL (ref 3.5–5.2)
Alkaline Phosphatase: 60 U/L (ref 39–117)
BUN: 24 mg/dL — AB (ref 6–23)
CHLORIDE: 100 meq/L (ref 96–112)
CO2: 29 mEq/L (ref 19–32)
Calcium: 10 mg/dL (ref 8.4–10.5)
Creatinine, Ser: 0.87 mg/dL (ref 0.40–1.50)
GFR: 90.42 mL/min (ref >60.00)
GLUCOSE: 99 mg/dL (ref 70–99)
POTASSIUM: 3.4 meq/L — AB (ref 3.5–5.1)
SODIUM: 138 meq/L (ref 135–145)
Total Bilirubin: 0.3 mg/dL (ref 0.2–1.2)
Total Protein: 6.6 g/dL (ref 6.0–8.3)

## 2016-11-24 MED ORDER — PREDNISONE 20 MG PO TABS: 20.0000 mg | ORAL_TABLET | Freq: Every day | ORAL | 3 refills | Status: DC

## 2016-11-24 NOTE — Telephone Encounter (Signed)
Patient agrees to try new dose. Rx sent.

## 2016-11-24 NOTE — Telephone Encounter (Signed)
Patient has been given results and instructions.  He did want me to ask you about the prednisone.  He said that he can not tell any difference being on it and was wondering if he could stop it.

## 2016-11-24 NOTE — Telephone Encounter (Signed)
He said he will be out running errands this afternoon to call his cell please [phone]. Thanks

## 2016-11-24 NOTE — Telephone Encounter (Signed)
-----   Message from Alda Berthold, DO sent at 11/24/2016  1:27 PM EDT ----- Please notify patient lab are within normal limits.  We will now check CBC and CMP every month x 3 months, then every 3 months. Thank you.

## 2016-11-24 NOTE — Telephone Encounter (Signed)
The dose of prednisone may still be too low, since he has no benefit, we can trying increasing to prednisone 20mg .  The reason we started low is because of his prior side effects with the medication, but we will not appreciate any improvement if he is not on a therapeutic dose.    Donika K. Posey Pronto, DO

## 2016-11-26 DIAGNOSIS — Z79891 Long term (current) use of opiate analgesic: Secondary | ICD-10-CM | POA: Diagnosis not present

## 2016-11-26 DIAGNOSIS — G894 Chronic pain syndrome: Secondary | ICD-10-CM | POA: Diagnosis not present

## 2016-11-26 DIAGNOSIS — M7551 Bursitis of right shoulder: Secondary | ICD-10-CM | POA: Diagnosis not present

## 2016-11-26 DIAGNOSIS — M7552 Bursitis of left shoulder: Secondary | ICD-10-CM | POA: Diagnosis not present

## 2016-11-26 DIAGNOSIS — Z79899 Other long term (current) drug therapy: Secondary | ICD-10-CM | POA: Diagnosis not present

## 2016-12-07 ENCOUNTER — Telehealth: Payer: Self-pay | Admitting: Neurology

## 2016-12-07 NOTE — Telephone Encounter (Signed)
PT called and said he is not doing well today and has been in bed for 2 days and wants to discuss this with you

## 2016-12-07 NOTE — Telephone Encounter (Signed)
Symptoms started Saturday.  Patient stayed in bed all day and he feels like he has a "seed stuck in his throat".   Do you need him to come in for a sooner appointment?

## 2016-12-07 NOTE — Telephone Encounter (Signed)
Please get more information - is there new slurred speech, difficulty swallowing, head weakness, limb weakness, shortness of breath?  If not, then it is unlikely that his current symptoms are due to a primary neurological cause, recommend he see urgent care or PCP for evaluation.

## 2016-12-08 ENCOUNTER — Ambulatory Visit (INDEPENDENT_AMBULATORY_CARE_PROVIDER_SITE_OTHER): Payer: Medicare HMO | Admitting: *Deleted

## 2016-12-08 DIAGNOSIS — I639 Cerebral infarction, unspecified: Secondary | ICD-10-CM

## 2016-12-08 NOTE — Telephone Encounter (Signed)
Patient said that he is having difficulty swallowing food and his meds and his legs are weak.  He did not go to work again today.  I instructed him to go to UC or PCP but he would rather be put on our urgent waiting list.

## 2016-12-08 NOTE — Telephone Encounter (Signed)
Noted. Keep him as high priority on cancellation list.

## 2016-12-10 ENCOUNTER — Telehealth: Payer: Self-pay | Admitting: Neurology

## 2016-12-10 NOTE — Telephone Encounter (Signed)
Spoke with patient's wife. She was calling again about patient's sudden onset of symptoms starting this weekend.   He had called earlier this week about his weakness and difficulty swallowing and declined urgent care appt.  Patient's wife states he must have been confused about the information given and thought they were still waiting for a call back.   To recap:  Patient woke up Saturday with leg weakness. He had worked all week with no trouble, but couldn't hardly stand on Saturday. This has not improved since. No weakness in arms or head. He has also had increased difficulty with swallowing. No pain in throat, just feels like something is "stuck". No shortness of breath. No slurred speech, although patient's wife states she has noticed an overall decrease in volume of his voice just this past week.   I advised with sudden onset of symptoms like these Dr. Posey Pronto had recommend he be seen in an urgent care for evaluation til he can get into our clinic to r/o any urgent etiologies for sudden symptoms. I explained the importance and wife expressed understanding. She will try to get him to go to urgent care for evaluation.  Aware he is still on high priority wait list for Dr. Posey Pronto.   Dr. Archie Endo.

## 2016-12-10 NOTE — Telephone Encounter (Signed)
Caller: Inez Catalina   Urgent? Yes  Reason for the call: PT's wife left a voicemail message saying it was very important to receive a call back regarding PT but did not say the reason why

## 2016-12-10 NOTE — Progress Notes (Signed)
Carelink Summary Report / Loop Recorder 

## 2016-12-11 ENCOUNTER — Telehealth: Payer: Self-pay | Admitting: Neurology

## 2016-12-11 NOTE — Telephone Encounter (Signed)
Called Angie back and her husband answered and said that his wife wasn't with him.  He will have her call me back.

## 2016-12-11 NOTE — Telephone Encounter (Signed)
Please call Angie patient daughter at [phone] or [phone] she needs to talk to someone about what is going on with her father please call

## 2016-12-15 ENCOUNTER — Encounter: Payer: Self-pay | Admitting: Neurology

## 2016-12-15 ENCOUNTER — Ambulatory Visit (INDEPENDENT_AMBULATORY_CARE_PROVIDER_SITE_OTHER): Payer: Medicare HMO | Admitting: Neurology

## 2016-12-15 VITALS — BP 130/84 | HR 115 | Ht 66.0 in | Wt 126.1 lb

## 2016-12-15 DIAGNOSIS — G7001 Myasthenia gravis with (acute) exacerbation: Secondary | ICD-10-CM | POA: Diagnosis not present

## 2016-12-15 MED ORDER — PREDNISONE 20 MG PO TABS: ORAL_TABLET | ORAL | 5 refills | Status: DC

## 2016-12-15 NOTE — Progress Notes (Signed)
Follow-up Visit   Date: 12/15/16    [name] MRN: [id number] DOB: [birth year]   Interim History: [name] is a 76 y.o. right-handed Caucasian male with hypertension, hyperlipidemia, CAD, GERD, current smoker, history of prostate cancer, chronic headache, lumbar radiculopathy, left MCA stroke, and myasthenia gravis returning to the clinic for follow-up of myasthenia gravis.  The patient was accompanied to the clinic by daughter.  History of present illness: In mid-December 2017, patient woke up with right sided closure. He saw his ophthalmologist and was diagnosed with right ptosis without associated extraocular muscle weakness.  Later, he developed monocular double vision. He was evaluated by Dr. Tomi Likens who ordered MRI/A brain which returned normal. 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  He was started on Mestinon 60 mg 3 times daily however did not tolerate this due to GI side effects.  He was referred to see me for management options.    Upon further questioning, he reports having 5-year history of bilateral leg fatigue.  He gets tired walking, but does not need to stop to take breaks or rest.  He is still able to complete his tasks, but it takes more effort to do the same amount of work.  He denies difficulty with swallowing or talking.  He has chronic shortness of breath and is scheduled to have a CT of the chest later this month for cancer surveillance.  For the past 5-10 years, he has noticed difficulty with numbness of the feet. He denies any painful paresthesias or weakness. He has noticed greater difficulty with imbalance, but has not had any falls. He walks unassisted.   UPDATE 11/03/2016:  Patient scheduled a sooner appointment because he was unable to tolerate prednisone and states he made him tired and did not appreciate any  improvement with his double vision, but only took it for about 2 weeks.  He continues to drive with one eye closed because of his double vision.  He only notices double vision when driving and seeing the road markings, but does not notice this when watching TV. He also complains of halos around car lights at night time. He also sees an image described as a white mitten in his visual field which is intermittent.  He is scheduled to see his eye doctor today.  No new difficulty with swallowing, talking, shortness of breath, or limb weakness.  He has generalized fatigue which is worse in the morning and states that he does not sleep well, waking up about 4 times every night.   UPDATE 12/15/2016:  Patient called for sooner appointment because of one week history of difficulty swallowing food and generalized weakness which started two weeks ago.  He is completely exhausted and feels tired, and is sleeping a lot.  He also started having new difficulty with swallowing.  He is okay with liquids, such as water and soup, but has difficulty with swallowing solids.  He has lost 10lb over the past month.  He also complains of weakness of the arms and legs, but is able to ambulate independently and is not dropping objects.  He has difficulty producing forceful cough.  He denies any shortness of breath and is sleeping on one pillow.  Daughter states that he is unable to keep up with chores at home and will be moving in with her.  Medications:  Current Outpatient Prescriptions on File  Prior to Visit  Medication Sig Dispense Refill  . azaTHIOprine (IMURAN) 50 MG tablet Take 1 tablet daily for a 2 weeks, then increase to 2 tablet daily. 60 tablet 3  . chlorthalidone (HYGROTON) 25 MG tablet TAKE 1 TABLET(25 MG) BY MOUTH DAILY 30 tablet 5  . dicyclomine (BENTYL) 10 MG capsule TAKE 1 CAPSULE BY MOUTH THREE TIMES DAILY BEFORE MEALS 270 capsule 1  . diltiazem (TIAZAC) 360 MG 24 hr capsule Take 1 capsule (360 mg total) by mouth  daily. 30 capsule 0  . fluticasone (FLONASE) 50 MCG/ACT nasal spray Use  As directed    . Glycopyrrolate-Formoterol (BEVESPI AEROSPHERE) 9-4.8 MCG/ACT AERO Inhale 2 puffs into the lungs 2 (two) times daily. 32.1 g 3  . HYDROcodone-acetaminophen (NORCO/VICODIN) 5-325 MG tablet Take 1 tablet by mouth every 6 (six) hours as needed for moderate pain.    . metoprolol succinate (TOPROL-XL) 25 MG 24 hr tablet Take 1 tablet (25 mg total) by mouth 2 (two) times daily. 180 tablet 3  . omeprazole (PRILOSEC) 20 MG capsule Take 1 capsule (20 mg total) by mouth 2 (two) times daily. 180 capsule 3  . predniSONE (DELTASONE) 20 MG tablet Take 1 tablet (20 mg total) by mouth daily. 30 tablet 3  . saccharomyces boulardii (FLORASTOR) 250 MG capsule Take 1 capsule (250 mg total) by mouth 2 (two) times daily. 60 capsule 1  . predniSONE (DELTASONE) 10 MG tablet Take 1.5 tablets (15 mg total) by mouth daily with breakfast. (Patient not taking: Reported on 12/15/2016) 45 tablet 5   No current facility-administered medications on file prior to visit.     Allergies:  Allergies  Allergen Reactions  . Pseudoephedrine Palpitations and Other (See Comments)    "Heart races"  . Tape Itching and Other (See Comments)    tears skin, can use paper tape  . Sulfa Antibiotics Itching  . Sulfacetamide Sodium Itching  . Lodine [Etodolac] Diarrhea  . Oxycodone Other (See Comments)    "over sedates"    Review of Systems:  CONSTITUTIONAL: No fevers, chills, night sweats, or weight loss.  EYES: +visual changes or eye pain ENT: No hearing changes.  No history of nose bleeds.   RESPIRATORY: No cough, wheezing and shortness of breath.   CARDIOVASCULAR: Negative for chest pain, and palpitations.   GI: Negative for abdominal discomfort, blood in stools or black stools.  No recent change in bowel habits.   GU:  No history of incontinence.   MUSCLOSKELETAL: No history of joint pain or swelling.  No myalgias.   SKIN: Negative for  lesions, rash, and itching.   ENDOCRINE: Negative for cold or heat intolerance, polydipsia or goiter.   PSYCH:  + depression or anxiety symptoms.   NEURO: As Above.   Vital Signs:  BP 130/84   Pulse (!) 115   Ht 5\' 6"  (1.676 m)   Wt 126 lb 1 oz (57.2 kg)   SpO2 96%   BMI 20.35 kg/m   Neurological Exam: MENTAL STATUS including orientation to time, place, person, recent and remote memory, attention span and concentration, language, and fund of knowledge is normal.  Speech is not dysarthric.  There is no nasal speech.   CRANIAL NERVES: Pupils equal round and reactive to light.  Normal conjugate, extra-ocular eye movements in all directions of gaze, except mildly restricted right upgaze.  Mild right ptosis worse with sustained upgaze. Oribicular oculi, orbicularis oris, frontalis, and buccinator is 5/5. Marland Kitchen  Face is symmetric. Palate elevates symmetrically.  Tongue  is midline and strength is 5/5.  MOTOR:  Motor strength is 5/5 in all extremities, including neck flexion.  No atrophy, fasciculations or abnormal movements.   COORDINATION/GAIT:  Gait narrow based and stable. He can stand up from low chair without pushing off with arms.  Data: MRI/A brain 06/05/2016:  1. No acute finding or explanation for symptoms. No mass, acute infarct, or aneurysm. 2. 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 Moderate area of remote left frontal cortex infarct.  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  He presents today with new symptoms of dysphagia to solids and generalized fatigue. His exam shows right ptosis and mildly restricted right lateral gaze.  His tongue  protrusion and ability to hold air in his cheeks is surprisingly very strong.  Neck flexion and limb strength is also 5/5.  Even though his clinical exam does not disclose weakness as would be expected with MG exacerbation, with his symptoms of dysphagia, I will go ahead and treat him more aggressively with increasing his prednisone to 30mg  x 1 week, and if no improvement in a week, increase further 40mg  daily.  He was given warning signs of MG crisis and instructed to go directly to the emergency department, should he develop neck heaviness, dyspnea, or worsening dysphagia.   PLAN: 1.  Increase prednisone to 30mg  x 1 week, if no improvement then increase to 40mg  daily  - calcium supplements 1200 mg/day and vitamin D intake of 800 international units/day through either diet 2.  Continue Imuran 100mg  daily.  Check CBC and CMP monthly 3.  Patient did not tolerate mestinon due to GI side effects     Daughter and patient had many questions about his disease, medications, and management options which I addressed to the best of my ability.  Return to clinic in 1 month   The duration of this appointment visit was 40 minutes of face-to-face time with the patient.  Greater than 50% of this time was spent in counseling, explanation of diagnosis, planning of further management, and coordination of care.   Thank you for allowing me to participate in patient's care.  If I can answer any additional questions, I would be pleased to do so.    Sincerely,    Mardelle Pandolfi K. Posey Pronto, DO

## 2016-12-15 NOTE — Patient Instructions (Addendum)
1.  Increase prednisone to 30mg  (1.5 tab) daily for 7 days, if no improvement, increase to 40mg  daily (2 tab). 2.  If you develop any new shortness of breath or worsening swallowing, go to the ER 3.  Call with update in 3 weeks  Return to clinic in 1 month

## 2016-12-17 LAB — CUP PACEART REMOTE DEVICE CHECK
Date Time Interrogation Session: 20180704003939
MDC IDC PG IMPLANT DT: 20170807

## 2016-12-23 ENCOUNTER — Ambulatory Visit (INDEPENDENT_AMBULATORY_CARE_PROVIDER_SITE_OTHER): Payer: Medicare HMO | Admitting: Internal Medicine

## 2016-12-23 ENCOUNTER — Other Ambulatory Visit: Payer: Self-pay

## 2016-12-23 ENCOUNTER — Other Ambulatory Visit (INDEPENDENT_AMBULATORY_CARE_PROVIDER_SITE_OTHER): Payer: Medicare HMO

## 2016-12-23 ENCOUNTER — Other Ambulatory Visit: Payer: Medicare HMO

## 2016-12-23 ENCOUNTER — Encounter: Payer: Self-pay | Admitting: Internal Medicine

## 2016-12-23 VITALS — BP 150/70 | HR 94 | Temp 98.5°F | Resp 16 | Ht 66.0 in | Wt 125.2 lb

## 2016-12-23 DIAGNOSIS — R946 Abnormal results of thyroid function studies: Secondary | ICD-10-CM | POA: Diagnosis not present

## 2016-12-23 DIAGNOSIS — I2510 Atherosclerotic heart disease of native coronary artery without angina pectoris: Secondary | ICD-10-CM

## 2016-12-23 DIAGNOSIS — I10 Essential (primary) hypertension: Secondary | ICD-10-CM

## 2016-12-23 DIAGNOSIS — G7000 Myasthenia gravis without (acute) exacerbation: Secondary | ICD-10-CM

## 2016-12-23 DIAGNOSIS — C61 Malignant neoplasm of prostate: Secondary | ICD-10-CM | POA: Diagnosis not present

## 2016-12-23 DIAGNOSIS — R5383 Other fatigue: Secondary | ICD-10-CM

## 2016-12-23 DIAGNOSIS — R7989 Other specified abnormal findings of blood chemistry: Secondary | ICD-10-CM

## 2016-12-23 LAB — COMPREHENSIVE METABOLIC PANEL
ALBUMIN: 4.2 g/dL (ref 3.5–5.2)
ALK PHOS: 52 U/L (ref 39–117)
ALT: 64 U/L — AB (ref 0–53)
ALT: 61 U/L — ABNORMAL HIGH (ref 9–46)
AST: 25 U/L (ref 0–37)
AST: 25 U/L (ref 10–35)
Albumin: 3.9 g/dL (ref 3.6–5.1)
Alkaline Phosphatase: 55 U/L (ref 40–115)
BUN: 27 mg/dL — AB (ref 6–23)
BUN: 27 mg/dL — AB (ref 7–25)
CHLORIDE: 99 mmol/L (ref 98–110)
CO2: 32 mEq/L (ref 19–32)
CO2: 24 mmol/L (ref 20–31)
CREATININE: 0.84 mg/dL (ref 0.40–1.50)
CREATININE: 0.82 mg/dL (ref 0.70–1.18)
Calcium: 10.2 mg/dL (ref 8.4–10.5)
Calcium: 9.6 mg/dL (ref 8.6–10.3)
Chloride: 97 mEq/L (ref 96–112)
GFR: 94.14 mL/min (ref >60.00)
GLUCOSE: 104 mg/dL — AB (ref 70–99)
GLUCOSE: 125 mg/dL — AB (ref 65–99)
POTASSIUM: 3.6 mmol/L (ref 3.5–5.3)
Potassium: 4.1 mEq/L (ref 3.5–5.1)
SODIUM: 137 meq/L (ref 135–145)
SODIUM: 136 mmol/L (ref 135–146)
TOTAL PROTEIN: 6.6 g/dL (ref 6.0–8.3)
TOTAL PROTEIN: 6.3 g/dL (ref 6.1–8.1)
Total Bilirubin: 0.5 mg/dL (ref 0.2–1.2)
Total Bilirubin: 0.4 mg/dL (ref 0.2–1.2)

## 2016-12-23 LAB — LIPID PANEL
Cholesterol: 214 mg/dL — ABNORMAL HIGH (ref 0–200)
HDL: 62 mg/dL (ref >39.00)
LDL Cholesterol: 112 mg/dL — ABNORMAL HIGH (ref 0–99)
NONHDL: 152.21
Total CHOL/HDL Ratio: 3
Triglycerides: 200 mg/dL — ABNORMAL HIGH (ref 0.0–149.0)
VLDL: 40 mg/dL (ref 0.0–40.0)

## 2016-12-23 LAB — CBC WITH DIFFERENTIAL/PLATELET
BASOS ABS: 0 {cells}/uL (ref 0–200)
Basophils Relative: 0 %
EOS ABS: 0 {cells}/uL — AB (ref 15–500)
EOS PCT: 0 %
HCT: 46.8 % (ref 38.5–50.0)
Hemoglobin: 16.8 g/dL (ref 13.2–17.1)
LYMPHS PCT: 13 %
Lymphs Abs: 1859 cells/uL (ref 850–3900)
MCH: 33.7 pg — AB (ref 27.0–33.0)
MCHC: 35.9 g/dL (ref 32.0–36.0)
MCV: 93.8 fL (ref 80.0–100.0)
MONOS PCT: 5 %
MPV: 8.9 fL (ref 7.5–12.5)
Monocytes Absolute: 715 cells/uL (ref 200–950)
NEUTROS ABS: 11726 {cells}/uL — AB (ref 1500–7800)
Neutrophils Relative %: 82 %
PLATELETS: 247 10*3/uL (ref 140–400)
RBC: 4.99 MIL/uL (ref 4.20–5.80)
RDW: 14.8 % (ref 11.0–15.0)
WBC: 14.3 10*3/uL — ABNORMAL HIGH (ref 3.8–10.8)

## 2016-12-23 LAB — PSA: PSA: 0 ng/mL — ABNORMAL LOW (ref 0.10–4.00)

## 2016-12-23 LAB — MAGNESIUM: Magnesium: 2.2 mg/dL (ref 1.5–2.5)

## 2016-12-23 NOTE — Progress Notes (Signed)
Subjective:  Patient ID: [name], male    DOB: 11-Aug-1940  Age: 76 y.o. MRN: [id number]  CC: Hypertension   HPI [name] presents for f/up - He complains of a one-month history of worsening weakness and fatigue. He recently saw his neurologist for the treatment of myasthenia gravis and his prednisone dose was increased. He complains of insomnia but doesn't want to treat it.  Outpatient Medications Prior to Visit  Medication Sig Dispense Refill  . azaTHIOprine (IMURAN) 50 MG tablet Take 1 tablet daily for a 2 weeks, then increase to 2 tablet daily. 60 tablet 3  . chlorthalidone (HYGROTON) 25 MG tablet TAKE 1 TABLET(25 MG) BY MOUTH DAILY 30 tablet 5  . dicyclomine (BENTYL) 10 MG capsule TAKE 1 CAPSULE BY MOUTH THREE TIMES DAILY BEFORE MEALS 270 capsule 1  . diltiazem (TIAZAC) 360 MG 24 hr capsule Take 1 capsule (360 mg total) by mouth daily. 30 capsule 0  . fluticasone (FLONASE) 50 MCG/ACT nasal spray Use  As directed    . metoprolol succinate (TOPROL-XL) 25 MG 24 hr tablet Take 1 tablet (25 mg total) by mouth 2 (two) times daily. 180 tablet 3  . omeprazole (PRILOSEC) 20 MG capsule Take 1 capsule (20 mg total) by mouth 2 (two) times daily. 180 capsule 3  . predniSONE (DELTASONE) 10 MG tablet Take 1.5 tablets (15 mg total) by mouth daily with breakfast. (Patient not taking: Reported on 12/15/2016) 45 tablet 5  . predniSONE (DELTASONE) 20 MG tablet Take 30mg  (1.5 tablet) daily for 7 days, if no improvement, increase to 40mg  daily. 60 tablet 5  . saccharomyces boulardii (FLORASTOR) 250 MG capsule Take 1 capsule (250 mg total) by mouth 2 (two) times daily. 60 capsule 1  . Glycopyrrolate-Formoterol (BEVESPI AEROSPHERE) 9-4.8 MCG/ACT AERO Inhale 2 puffs into the lungs 2 (two) times daily. 32.1 g 3  . HYDROcodone-acetaminophen (NORCO/VICODIN) 5-325 MG tablet Take 1 tablet by mouth every 6 (six) hours as needed for moderate pain.     No facility-administered medications prior to visit.      ROS Review of Systems  Constitutional: Positive for fatigue. Negative for appetite change, chills, diaphoresis and unexpected weight change.  HENT: Negative for trouble swallowing.   Eyes: Negative for visual disturbance.  Respiratory: Negative for cough, chest tightness, shortness of breath and wheezing.   Cardiovascular: Negative for chest pain, palpitations and leg swelling.  Gastrointestinal: Negative for abdominal pain, constipation, diarrhea, nausea and vomiting.  Endocrine: Negative.  Negative for cold intolerance and heat intolerance.  Genitourinary: Negative.  Negative for difficulty urinating.  Musculoskeletal: Negative for arthralgias and myalgias.  Skin: Negative.   Allergic/Immunologic: Negative.   Neurological: Positive for weakness. Negative for light-headedness, numbness and headaches.  Hematological: Negative for adenopathy. Does not bruise/bleed easily.  Psychiatric/Behavioral: Negative.     Objective:  BP (!) 150/70 (BP Location: Left Arm, Patient Position: Sitting, Cuff Size: Normal)   Pulse 94   Temp 98.5 F (36.9 C) (Oral)   Resp 16   Ht 5\' 6"  (1.676 m)   Wt 125 lb 4 oz (56.8 kg)   SpO2 98%   BMI 20.22 kg/m   BP Readings from Last 3 Encounters:  12/23/16 (!) 150/70  12/15/16 130/84  11/03/16 (!) 160/90    Wt Readings from Last 3 Encounters:  12/23/16 125 lb 4 oz (56.8 kg)  12/15/16 126 lb 1 oz (57.2 kg)  11/03/16 137 lb 2 oz (62.2 kg)    Physical Exam  Constitutional: He  is oriented to person, place, and time. No distress.  HENT:  Mouth/Throat: Oropharynx is clear and moist. No oropharyngeal exudate.  Eyes: Conjunctivae are normal. Right eye exhibits no discharge. Left eye exhibits no discharge. No scleral icterus.  Neck: Normal range of motion. Neck supple. No JVD present. No thyromegaly present.  Cardiovascular: Normal rate, regular rhythm and intact distal pulses.  Exam reveals no gallop and no friction rub.   No murmur  heard. Pulmonary/Chest: Effort normal and breath sounds normal. No respiratory distress. He has no wheezes. He has no rales. He exhibits no tenderness.  Abdominal: Soft. Bowel sounds are normal. He exhibits no distension and no mass. There is no tenderness. There is no rebound and no guarding.  Musculoskeletal: Normal range of motion. He exhibits no edema, tenderness or deformity.  Lymphadenopathy:    He has no cervical adenopathy.  Neurological: He is alert and oriented to person, place, and time. He has normal reflexes. He displays atrophy. He displays no tremor. No cranial nerve deficit or sensory deficit. He exhibits abnormal muscle tone. He displays a negative Romberg sign. He displays no seizure activity. Coordination and gait normal.  He has diffuse, symmetrical atrophy and weakness  Skin: Skin is warm and dry. No rash noted. He is not diaphoretic. No erythema. No pallor.  Vitals reviewed.   Lab Results  Component Value Date   WBC 14.3 (H) 12/23/2016   HGB 16.8 12/23/2016   HCT 46.8 12/23/2016   PLT 247 12/23/2016   GLUCOSE 125 (H) 12/23/2016   CHOL 214 (H) 12/23/2016   TRIG 200.0 (H) 12/23/2016   HDL 62.00 12/23/2016   LDLDIRECT 134.5 08/22/2007   LDLCALC 112 (H) 12/23/2016   ALT 61 (H) 12/23/2016   AST 25 12/23/2016   NA 136 12/23/2016   K 3.6 12/23/2016   CL 99 12/23/2016   CREATININE 0.82 12/23/2016   BUN 27 (H) 12/23/2016   CO2 24 12/23/2016   TSH 0.30 (L) 12/23/2016   PSA 0.00 Repeated and verified X2. (L) 12/23/2016   INR 1.01 06/05/2016    Ct Chest Lung Ca Screen Low Dose W/o Cm  Result Date: 10/20/2016 CLINICAL DATA:  Low dose Lung cancer screening. EXAM: CT CHEST WITHOUT CONTRAST LOW-DOSE FOR LUNG CANCER SCREENING TECHNIQUE: Multidetector CT imaging of the chest was performed following the standard protocol without IV contrast. COMPARISON:  None. FINDINGS: Cardiovascular: Normal heart size. No pleural effusion or edema. Aortic atherosclerosis noted.  Calcification in the RCA, LAD and left circumflex coronary artery noted. Mediastinum/Nodes: No enlarged mediastinal, hilar, or axillary lymph nodes. Thyroid gland, trachea, and esophagus demonstrate no significant findings. Lungs/Pleura: Mild changes of centrilobular emphysema. There are 2 small nodules identified within the left lung which measure up to 3.7 mm. Upper Abdomen: Multiple cysts involving and left kidney are identified. Incompletely characterized without IV contrast. 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 No acute abnormality identified within the upper abdomen. 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 A loop recorder is identified implanted within the left chest wall. IMPRESSION: 1. Lung-RADS 2, benign appearance or behavior. Continue annual screening with low-dose chest CT without contrast in 12 months. 2. Aortic Atherosclerosis (ICD10-I70.0) and Emphysema (ICD10-J43.9). Electronically Signed   By: Kerby Moors M.D.   On: 10/20/2016 09:08    Assessment & Plan:   Heman was seen today for hypertension.  Diagnoses and all orders for this visit:  ADENOCARCINOMA, PROSTATE- PSA is undetectable so he  doesn't have a recurrence of prostate cancer. -     PSA; Future  Essential hypertension- his blood pressure is well-controlled, electrolytes and renal function are normal -     Comprehensive metabolic panel; Future -     Magnesium; Future  Coronary artery disease involving native coronary artery of native heart without angina pectoris- he has no symptoms of angina. He has not achieved his LDL goal but also think a statin is contraindicated considering his symptoms related MG. -     Lipid panel; Future  Other fatigue- his labs are remarkable for a mildly suppressed TSH so will monitor him for the development of hyperthyroidism. His white cell count is elevated but that's consistent with his prednisone  therapy. At this time for like these symptoms are related to MG and will likely improve over time since he has started taking azathioprine. -     Thyroid Panel With TSH; Future  Low TSH level- he will return in about 4-6 weeks for me to recheck his TSH level and to screen for hyperthyroidism.   I have discontinued Mr. Brandow HYDROcodone-acetaminophen and Glycopyrrolate-Formoterol. I am also having him maintain his metoprolol succinate, omeprazole, saccharomyces boulardii, dicyclomine, fluticasone, diltiazem, chlorthalidone, azaTHIOprine, predniSONE, and predniSONE.  No orders of the defined types were placed in this encounter.    Follow-up: Return in about 4 weeks (around 01/20/2017).  Scarlette Calico, MD

## 2016-12-23 NOTE — Patient Instructions (Signed)

## 2016-12-24 ENCOUNTER — Telehealth: Payer: Self-pay | Admitting: *Deleted

## 2016-12-24 DIAGNOSIS — R7989 Other specified abnormal findings of blood chemistry: Secondary | ICD-10-CM | POA: Insufficient documentation

## 2016-12-24 LAB — THYROID PANEL WITH TSH
Free Thyroxine Index: 2.5 (ref 1.4–3.8)
T3 UPTAKE: 37 % — AB (ref 22–35)
T4 TOTAL: 6.8 ug/dL (ref 4.5–12.0)
TSH: 0.3 m[IU]/L — AB (ref 0.40–4.50)

## 2016-12-24 NOTE — Telephone Encounter (Signed)
-----   Message from Pieter Partridge, DO sent at 12/24/2016  8:34 AM EDT ----- Labs are stable.

## 2016-12-24 NOTE — Telephone Encounter (Signed)
Patient notified

## 2017-01-07 ENCOUNTER — Ambulatory Visit (INDEPENDENT_AMBULATORY_CARE_PROVIDER_SITE_OTHER): Payer: Medicare HMO | Admitting: *Deleted

## 2017-01-07 DIAGNOSIS — I639 Cerebral infarction, unspecified: Secondary | ICD-10-CM | POA: Diagnosis not present

## 2017-01-08 NOTE — Progress Notes (Signed)
Carelink Summary Report / Loop Recorder 

## 2017-01-15 ENCOUNTER — Other Ambulatory Visit (INDEPENDENT_AMBULATORY_CARE_PROVIDER_SITE_OTHER): Payer: Medicare HMO

## 2017-01-15 ENCOUNTER — Ambulatory Visit: Payer: Medicare HMO | Admitting: Neurology

## 2017-01-15 ENCOUNTER — Encounter: Payer: Self-pay | Admitting: Neurology

## 2017-01-15 ENCOUNTER — Ambulatory Visit (INDEPENDENT_AMBULATORY_CARE_PROVIDER_SITE_OTHER): Payer: Medicare HMO | Admitting: Neurology

## 2017-01-15 VITALS — BP 150/90 | HR 106 | Ht 66.0 in | Wt 120.5 lb

## 2017-01-15 DIAGNOSIS — R2689 Other abnormalities of gait and mobility: Secondary | ICD-10-CM

## 2017-01-15 DIAGNOSIS — G7000 Myasthenia gravis without (acute) exacerbation: Secondary | ICD-10-CM | POA: Diagnosis not present

## 2017-01-15 DIAGNOSIS — Z79899 Other long term (current) drug therapy: Secondary | ICD-10-CM

## 2017-01-15 DIAGNOSIS — R258 Other abnormal involuntary movements: Secondary | ICD-10-CM

## 2017-01-15 LAB — CBC WITH DIFFERENTIAL/PLATELET
Basophils Absolute: 0 10*3/uL (ref 0.0–0.1)
Basophils Relative: 0.1 % (ref 0.0–3.0)
EOS ABS: 0 10*3/uL (ref 0.0–0.7)
Eosinophils Relative: 0 % (ref 0.0–5.0)
HCT: 50.8 % (ref 39.0–52.0)
HEMOGLOBIN: 17 g/dL (ref 13.0–17.0)
Lymphocytes Relative: 4.8 % — ABNORMAL LOW (ref 12.0–46.0)
Lymphs Abs: 0.7 10*3/uL (ref 0.7–4.0)
MCHC: 33.6 g/dL (ref 30.0–36.0)
MCV: 99.1 fl (ref 78.0–100.0)
MONO ABS: 0.4 10*3/uL (ref 0.1–1.0)
Monocytes Relative: 2.4 % — ABNORMAL LOW (ref 3.0–12.0)
NEUTROS PCT: 92.7 % — AB (ref 43.0–77.0)
Neutro Abs: 13.6 10*3/uL — ABNORMAL HIGH (ref 1.4–7.7)
Platelets: 242 10*3/uL (ref 150.0–400.0)
RBC: 5.12 Mil/uL (ref 4.22–5.81)
RDW: 16.4 % — AB (ref 11.5–15.5)
WBC: 14.7 10*3/uL — AB (ref 4.0–10.5)

## 2017-01-15 LAB — COMPREHENSIVE METABOLIC PANEL
ALBUMIN: 4.1 g/dL (ref 3.5–5.2)
ALT: 47 U/L (ref 0–53)
AST: 24 U/L (ref 0–37)
Alkaline Phosphatase: 52 U/L (ref 39–117)
BUN: 29 mg/dL — AB (ref 6–23)
CHLORIDE: 98 meq/L (ref 96–112)
CO2: 32 mEq/L (ref 19–32)
CREATININE: 0.86 mg/dL (ref 0.40–1.50)
Calcium: 9.5 mg/dL (ref 8.4–10.5)
GFR: 91.6 mL/min (ref >60.00)
GLUCOSE: 113 mg/dL — AB (ref 70–99)
Potassium: 3.9 mEq/L (ref 3.5–5.1)
SODIUM: 138 meq/L (ref 135–145)
Total Bilirubin: 0.7 mg/dL (ref 0.2–1.2)
Total Protein: 6.7 g/dL (ref 6.0–8.3)

## 2017-01-15 MED ORDER — CARBIDOPA-LEVODOPA 25-100 MG PO TABS: 1.0000 | ORAL_TABLET | Freq: Three times a day (TID) | ORAL | 3 refills | Status: AC

## 2017-01-15 NOTE — Patient Instructions (Addendum)
Reduce prednisone to 30mg  daily x 2 weeks, then continue 20mg  thereafter  Starting September 1st, start Carbidopa Levodopa as follows at least 30-min prior to meals:     AM  Afternoon   Evening   Week 1:  1/2 tab  1/2 tab   1/2 tab  Week 2:   1/2 tab  1/2 tab   1 tab  Week 3:  1/2 tab  1 tab   1 tab  Week 4:  1 tab  1 tab   1 tab  *Avoid taking with protein products, such as milk, meat, cheese  *if you develop nausea, take with crackers  Call with an update at the end of September  Return to clinic 3 months

## 2017-01-15 NOTE — Progress Notes (Signed)
Follow-up Visit   Date: 01/15/17    [name] MRN: [id number] DOB: 01/03/1941   Interim History: [name] is a 76 y.o. right-handed Caucasian male with hypertension, hyperlipidemia, CAD, GERD, current smoker, history of prostate cancer, chronic headache, lumbar radiculopathy, left MCA stroke, and myasthenia gravis returning to the clinic for follow-up of myasthenia gravis and new complaints of gait changes.  The patient was accompanied to the clinic by daughter.  History of present illness: In mid-December 2017, patient woke up with right sided closure. He saw his ophthalmologist and was diagnosed with right ptosis without associated extraocular muscle weakness.  Later, he developed monocular double vision. He was evaluated by Dr. Tomi Likens who ordered MRI/A brain which returned normal. 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  He was started on Mestinon 60 mg 3 times daily however did not tolerate this due to GI side effects.  He was referred to see me for management options.    Upon further questioning, he reports having 5-year history of bilateral leg fatigue.  He gets tired walking, but does not need to stop to take breaks or rest.  He is still able to complete his tasks, but it takes more effort to do the same amount of work.  He denies difficulty with swallowing or talking.  He has chronic shortness of breath and is scheduled to have a CT of the chest later this month for cancer surveillance.  For the past 5-10 years, he has noticed difficulty with numbness of the feet. He denies any painful paresthesias or weakness. He has noticed greater difficulty with imbalance, but has not had any falls. He walks unassisted.   UPDATE 11/03/2016:  Patient scheduled a sooner appointment because he was unable to tolerate prednisone and states he made him  tired and did not appreciate any improvement with his double vision, but only took it for about 2 weeks.  He continues to drive with one eye closed because of his double vision.  He only notices double vision when driving and seeing the road markings, but does not notice this when watching TV. He also complains of halos around car lights at night time. He also sees an image described as a white mitten in his visual field which is intermittent.  He is scheduled to see his eye doctor today.  No new difficulty with swallowing, talking, shortness of breath, or limb weakness.  He has generalized fatigue which is worse in the morning and states that he does not sleep well, waking up about 4 times every night.   UPDATE 12/15/2016:  Patient called for sooner appointment because of one week history of difficulty swallowing food and generalized weakness which started two weeks ago.  He is completely exhausted and feels tired, and is sleeping a lot.  He also started having new difficulty with swallowing.  He is okay with liquids, such as water and soup, but has difficulty with swallowing solids.  He has lost 10lb over the past month.  He also complains of weakness of the arms and legs, but is able to ambulate independently and is not dropping objects.  He has difficulty producing forceful cough.  He denies any shortness of breath and is sleeping on one pillow.  Daughter states that he is unable to keep up with chores at home and will be moving in with her.  UPDATE  01/15/2017:   Since his last visit, he has been on prednisone 40mg  daily and has not noticed any new double vision, worsening droopy eye lid, or weakness.  He occassionally had difficulty swallowing, but nothing as severe as before.  His primary complaint today is difficulty with walking.  Daughter states that he shuffles his steps and tends to shake, especially the head.  He endorses slowed movements and some stiffness.  Sense of smell is intact. He complains of  chronic IBS and constipation.    Medications:  Current Outpatient Prescriptions on File Prior to Visit  Medication Sig Dispense Refill  . azaTHIOprine (IMURAN) 50 MG tablet Take 1 tablet daily for a 2 weeks, then increase to 2 tablet daily. 60 tablet 3  . chlorthalidone (HYGROTON) 25 MG tablet TAKE 1 TABLET(25 MG) BY MOUTH DAILY 30 tablet 5  . dicyclomine (BENTYL) 10 MG capsule TAKE 1 CAPSULE BY MOUTH THREE TIMES DAILY BEFORE MEALS 270 capsule 1  . diltiazem (TIAZAC) 360 MG 24 hr capsule Take 1 capsule (360 mg total) by mouth daily. 30 capsule 0  . fluticasone (FLONASE) 50 MCG/ACT nasal spray Use  As directed    . metoprolol succinate (TOPROL-XL) 25 MG 24 hr tablet Take 1 tablet (25 mg total) by mouth 2 (two) times daily. 180 tablet 3  . omeprazole (PRILOSEC) 20 MG capsule Take 1 capsule (20 mg total) by mouth 2 (two) times daily. 180 capsule 3  . predniSONE (DELTASONE) 20 MG tablet Take 30mg  (1.5 tablet) daily for 7 days, if no improvement, increase to 40mg  daily. 60 tablet 5  . saccharomyces boulardii (FLORASTOR) 250 MG capsule Take 1 capsule (250 mg total) by mouth 2 (two) times daily. 60 capsule 1   No current facility-administered medications on file prior to visit.     Allergies:  Allergies  Allergen Reactions  . Pseudoephedrine Palpitations and Other (See Comments)    "Heart races"  . Tape Itching and Other (See Comments)    tears skin, can use paper tape  . Sulfa Antibiotics Itching  . Sulfacetamide Sodium Itching  . Lodine [Etodolac] Diarrhea  . Oxycodone Other (See Comments)    "over sedates"    Review of Systems:  CONSTITUTIONAL: No fevers, chills, night sweats, or weight loss.  EYES: +visual changes or eye pain ENT: No hearing changes.  No history of nose bleeds.   RESPIRATORY: No cough, wheezing and shortness of breath.   CARDIOVASCULAR: Negative for chest pain, and palpitations.   GI: Negative for abdominal discomfort, blood in stools or black stools.  No recent  change in bowel habits.   GU:  No history of incontinence.   MUSCLOSKELETAL: No history of joint pain or swelling.  No myalgias.   SKIN: Negative for lesions, rash, and itching.   ENDOCRINE: Negative for cold or heat intolerance, polydipsia or goiter.   PSYCH:  + depression or anxiety symptoms.   NEURO: As Above.   Vital Signs:  BP (!) 150/90   Pulse (!) 106   Ht 5\' 6"  (1.676 m)   Wt 120 lb 8 oz (54.7 kg)   SpO2 98%   BMI 19.45 kg/m   Neurological Exam: MENTAL STATUS including orientation to time, place, person, recent and remote memory, attention span and concentration, language, and fund of knowledge is normal.  Speech is not dysarthric.    CRANIAL NERVES: Pupils equal round and reactive to light.  Normal conjugate, extra-ocular eye movements in all directions of gaze, except mildly restricted right upgaze.  Mild right ptosis without worsening with sustained upgaze. Oribicular oculi, orbicularis oris, frontalis, and buccinator is 5/5. Marland Kitchen  Face is symmetric. Palate elevates symmetrically.  Tongue is midline and strength is 5/5.  MOTOR:  Motor strength is 5/5 in all extremities, including neck flexion. No muscle fatigability. He is tremulous when strength testing and there is a mild resting head tremor.  No rigidity.  COORDINATION/GAIT:  Gait shows small fenestrating steps (new) with poor arm swing.  There is bradykinesia with finger tapping bilaterally, worse on the left.  Toe and heel tapping show small amplitude.  Data: MRI/A brain 06/05/2016:  1. No acute finding or explanation for symptoms. No mass, acute infarct, or aneurysm. 2. 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 Moderate area of remote left frontal cortex infarct.  Labs 06/11/2016:  AChR binding 11.10*, blocking 48, modulating 91  Labs TSH 1.26, vitamin B12 649, folate >23.7 Labs 10/09/2016:  Copper 96, SPEP with IFE no M protein, TMPT normal  CT chest 10/19/2016:  No  thymoma  IMPRESSION/PLAN: 1.  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 Clinically, stable without any new symptoms.  He continues to have baseline right ptosis.  No signs of exacerbation, so will start prednisone taper.  He did not tolerate mestinon due to GI side effects.  - Reduce prednisone to 30mg  x 2 weeks, then prednisone 20mg  thereafter.   - Conitnue Imuran 100mg  daily.  - Check CBC and CMP every 3 months  - Calcium supplements 1200 mg/day and vitamin D intake of 800 international units/day through either diet  2.  Shuffling gait and bradykinesia consistent with parkinsonism.  I will offer him a trial of sinemet and follow him clinically.    - Start sinemet 25/100 half tab TID and titrate to 1 tab TID over 4 weeks.  Call with update in 6 weeks  Return to clinic in 3 month   The duration of this appointment visit was 40  minutes of face-to-face time with the patient.  Greater than 50% of this time was spent in counseling, explanation of diagnosis, planning of further management, and coordination of care.   Thank you for allowing me to participate in patient's care.  If I can answer any additional questions, I would be pleased to do so.    Sincerely,    Donika K. Posey Pronto, DO

## 2017-01-18 ENCOUNTER — Other Ambulatory Visit: Payer: Self-pay | Admitting: Internal Medicine

## 2017-01-18 DIAGNOSIS — D72829 Elevated white blood cell count, unspecified: Secondary | ICD-10-CM | POA: Insufficient documentation

## 2017-01-19 ENCOUNTER — Telehealth: Payer: Self-pay | Admitting: *Deleted

## 2017-01-19 LAB — CUP PACEART REMOTE DEVICE CHECK
Implantable Pulse Generator Implant Date: 20170807
MDC IDC SESS DTM: 20180803013732

## 2017-01-19 NOTE — Telephone Encounter (Signed)
Called patient.  Phone just rang and then stopped.  Will try again later.

## 2017-01-19 NOTE — Telephone Encounter (Signed)
-----   Message from Alda Berthold, DO sent at 01/18/2017 12:39 PM EDT ----- Please inform patient that I have reviewed his labs with his PCP and because there is elevated white count and he does not have signs of infection, we would like to check a few additional labs.  Dr. Ronnald Ramp' office will place the order. Thanks.

## 2017-01-19 NOTE — Telephone Encounter (Signed)
Called patient again on cell phone and left message for him to call me back.

## 2017-01-20 ENCOUNTER — Telehealth: Payer: Self-pay | Admitting: *Deleted

## 2017-01-20 NOTE — Telephone Encounter (Signed)
Will call again.

## 2017-01-20 NOTE — Telephone Encounter (Signed)
-----   Message from Alda Berthold, DO sent at 01/18/2017 12:39 PM EDT ----- Please inform patient that I have reviewed his labs with his PCP and because there is elevated white count and he does not have signs of infection, we would like to check a few additional labs.  Dr. Ronnald Ramp' office will place the order. Thanks.

## 2017-01-20 NOTE — Telephone Encounter (Signed)
Patient's daughter given the results and instructions.  She will have labs done tomorrow.

## 2017-01-21 ENCOUNTER — Ambulatory Visit (INDEPENDENT_AMBULATORY_CARE_PROVIDER_SITE_OTHER): Payer: Medicare HMO | Admitting: Internal Medicine

## 2017-01-21 ENCOUNTER — Other Ambulatory Visit: Payer: Self-pay | Admitting: *Deleted

## 2017-01-21 ENCOUNTER — Encounter (INDEPENDENT_AMBULATORY_CARE_PROVIDER_SITE_OTHER): Payer: Self-pay

## 2017-01-21 ENCOUNTER — Encounter: Payer: Self-pay | Admitting: Internal Medicine

## 2017-01-21 ENCOUNTER — Other Ambulatory Visit: Payer: Medicare HMO

## 2017-01-21 VITALS — BP 142/50 | HR 85 | Ht 66.0 in | Wt 121.0 lb

## 2017-01-21 DIAGNOSIS — I639 Cerebral infarction, unspecified: Secondary | ICD-10-CM

## 2017-01-21 DIAGNOSIS — D72829 Elevated white blood cell count, unspecified: Secondary | ICD-10-CM | POA: Diagnosis not present

## 2017-01-21 DIAGNOSIS — R899 Unspecified abnormal finding in specimens from other organs, systems and tissues: Secondary | ICD-10-CM

## 2017-01-21 NOTE — Progress Notes (Signed)
Patient Care Team: Janith Lima, MD as PCP - General (Internal Medicine) Imagene Riches, MD (Internal Medicine)   HPI  [name] is a 76 y.o. male Seen in followup implantable loop recorder.  He has a history of a stroke 5/17. MRI imaging demonstrated a left MCA infarction. He was given a 30 day event recorder demonstrated PACs and PVCs but no atrial fibrillation. Echocardiogram 2/16 demonstrated normal LV function and a repeat echocardiogram is pending  He denies chest pain shortness of breath. He has no complaints about residual effects from his stroke  He has myasthenia gravis with leg weakness   Records and Results Reviewed  Past Medical History:  Diagnosis Date  . Adenoma of left adrenal gland    stable per ct  . Coronary artery disease CARDIOLOGIST-- DR Rayann Heman   NON-OBSTRUCTIVE CAD--- 2005 Cath with 30% LAD, calcification/plaque of LAD, EF 60%  . DDD (degenerative disc disease), lumbosacral   . Depression   . Diverticulosis, sigmoid   . Eczema   . ED (erectile dysfunction)   . Encopresis   . GERD (gastroesophageal reflux disease)   . Hemorrhoids 2002  . History of duodenal ulcer   . History of esophageal dilatation    FOR STRICTURE 2008  . History of gallstones   . History of kidney stones 2014   right ureteral lithotripsy 01/2013  . History of peptic ulcer   . History of prostate cancer    S/P RADICAL PROSTATECTOMY 2009  . Hyperlipidemia   . Hypertension   . IBS (irritable bowel syndrome)   . Ischemic colitis ([place])   . OA (osteoarthritis)    RIGHT SHOULDER/ HIPS  . PAC (premature atrial contraction)   . Peyronie's disease   . Right ureteral stone   . Rotator cuff tear arthropathy of both shoulders     Past Surgical History:  Procedure Laterality Date  . CARDIAC CATHETERIZATION  07-23-2003   Non-obstructive CAD/  30% narrowing ostium LM,  30-40% eccentric calcified plaque LAD, 20% narrowing RCA/  preserved LVF  . CARDIOVASCULAR STRESS  TEST  12-23-2012  DR ALLRED   Low risk lexiscan study with small mild intensity, fixed inferior defect consistent with thinning, no ischemia/  normal LVF and wall motion, ef 66%  . COLONOSCOPY  last one 06-19-2011  . CYSTOSCOPY W/ RETROGRADES Right 07/19/2014   Procedure: CYSTOSCOPY WITH RETROGRADE PYELOGRAM;  Surgeon: Malka So, MD;  Location[facility];  Service: Urology;  Laterality: Right;  . CYSTOSCOPY/RETROGRADE/URETEROSCOPY/STONE EXTRACTION WITH BASKET Right 07/19/2014   Procedure: RIGHT URETEROSCOPY/STONE EXTRACTION WITH BASKET;  Surgeon: Malka So, MD;  Location[facility];  Service: Urology;  Laterality: Right;  . EP IMPLANTABLE DEVICE N/A 01/13/2016   Procedure: Loop Recorder Insertion;  Surgeon: Deboraha Sprang, MD;  Location: [facility];  Service: Cardiovascular;  Laterality: N/A;  . ESOPHAGOGASTRODUODENOSCOPY (EGD) WITH ESOPHAGEAL DILATION  02-04-2001  . EXTRACORPOREAL SHOCK WAVE LITHOTRIPSY Right 01-30-2013  . FLEXIBLE SIGMOIDOSCOPY N/A 03/19/2014   Procedure: FLEXIBLE SIGMOIDOSCOPY;  Surgeon: Inda Castle, MD;  Location: [facility];  Service: Endoscopy;  Laterality: N/A;  . INGUINAL HERNIA REPAIR Bilateral 03-17-2010  . LOOP RECORDER IMPLANT    . LUMBAR FUSION  03-26-2005   Revision laminectomy L5 - S1,  Laminectomy and Decompression L4-L5, fusion L4 -- S1  . LUMBAR LAMINECTOMY  1999   L5  -- S1  . NASAL SEPTUM SURGERY  YRS AGO  . RADICAL PROSTATECTOMY/  BILATERAL RETROPERITONEAL LYMPH NODE DISSECTION  12-29-2007  . REMOVAL BENIGN RIGHT EYELID LESION  12-19-2007  . SLING CYSTOURETHROPEXY (AdVANCE)  10-12-2009  . TONSILLECTOMY AND ADENOIDECTOMY  as child  . TRANSTHORACIC ECHOCARDIOGRAM  07-13-2014   mild LVH/  ef 60-655,  grade I diastolic dysfunction/  trivial AR, MR and TR/  mild LAE    Current Outpatient Prescriptions  Medication Sig Dispense Refill  . azaTHIOprine (IMURAN) 50 MG tablet Take 1 tablet daily for a 2 weeks,  then increase to 2 tablet daily. 60 tablet 3  . carbidopa-levodopa (SINEMET IR) 25-100 MG tablet Take 1 tablet by mouth 3 (three) times daily. 90 tablet 3  . chlorthalidone (HYGROTON) 25 MG tablet TAKE 1 TABLET(25 MG) BY MOUTH DAILY 30 tablet 5  . dicyclomine (BENTYL) 10 MG capsule TAKE 1 CAPSULE BY MOUTH THREE TIMES DAILY BEFORE MEALS 270 capsule 1  . diltiazem (TIAZAC) 360 MG 24 hr capsule Take 1 capsule (360 mg total) by mouth daily. 30 capsule 0  . fluticasone (FLONASE) 50 MCG/ACT nasal spray Use  As directed    . HYDROcodone-acetaminophen (NORCO/VICODIN) 5-325 MG tablet TK 1 T PO Q 4 TO 6 H PRN P  0  . metoprolol succinate (TOPROL-XL) 25 MG 24 hr tablet Take 1 tablet (25 mg total) by mouth 2 (two) times daily. 180 tablet 3  . omeprazole (PRILOSEC) 20 MG capsule Take 1 capsule (20 mg total) by mouth 2 (two) times daily. 180 capsule 3  . predniSONE (DELTASONE) 20 MG tablet Take 30mg  (1.5 tablet) daily for 7 days, if no improvement, increase to 40mg  daily. 60 tablet 5  . saccharomyces boulardii (FLORASTOR) 250 MG capsule Take 1 capsule (250 mg total) by mouth 2 (two) times daily. 60 capsule 1   No current facility-administered medications for this visit.     Allergies  Allergen Reactions  . Pseudoephedrine Palpitations and Other (See Comments)    "Heart races"  . Tape Itching and Other (See Comments)    tears skin, can use paper tape  . Sulfa Antibiotics Itching  . Sulfacetamide Sodium Itching  . Lodine [Etodolac] Diarrhea  . Oxycodone Other (See Comments)    "over sedates"      Review of Systems negative except from HPI and PMH  Physical Exam BP (!) 142/50   Pulse 85   Ht 5\' 6"  (5.102 m)   Wt 121 lb (54.9 kg)   SpO2 98%   BMI 19.53 kg/m  Well developed and well nourished in no acute distress HENT normal E scleral and icterus clear Neck Supple JVP flat;  Device pocket well healed; without hematoma or erythema.  There is no tethering  Clear to ausculation Regular rate  and rhythm, no murmurs gallops or rub Soft with active bowel sounds No clubbing cyanosis  Edema Alert and oriented, grossly normal motor and sensory function Skin Warm and Dry  ECG  Sinus 79 13/08/38  Assessment and  Plan  Cryptogenic Stroke  Implantable loop recorder   He would like to stop monitoring from his LINQ  We will disconnect him from [facility]  He is here with his daughter with whom it seems they have had prior conversations   Continue current meds     Current medicines are reviewed at length with the patient today .  The patient does not  have concerns regarding medicines.

## 2017-01-21 NOTE — Patient Instructions (Addendum)
Medication Instructions: - Your physician recommends that you continue on your current medications as directed. Please refer to the Current Medication list given to you today.  Labwork: - none ordered  Procedures/Testing: - none ordered  Follow-Up: - Dr. Klein will see you back on an as needed basis.  Any Additional Special Instructions Will Be Listed Below (If Applicable).     If you need a refill on your cardiac medications before your next appointment, please call your pharmacy.   

## 2017-01-22 LAB — LACTATE DEHYDROGENASE: LDH: 250 U/L (ref 120–250)

## 2017-01-25 ENCOUNTER — Observation Stay (HOSPITAL_COMMUNITY)
Admission: EM | Admit: 2017-01-25 | Discharge: 2017-01-27 | Disposition: A | Discharge status: Home Health | Payer: Medicare HMO | Attending: Internal Medicine | Admitting: Internal Medicine

## 2017-01-25 ENCOUNTER — Encounter (HOSPITAL_COMMUNITY): Payer: Self-pay | Admitting: Emergency Medicine

## 2017-01-25 DIAGNOSIS — Z79899 Other long term (current) drug therapy: Secondary | ICD-10-CM | POA: Diagnosis not present

## 2017-01-25 DIAGNOSIS — E860 Dehydration: Secondary | ICD-10-CM | POA: Insufficient documentation

## 2017-01-25 DIAGNOSIS — K5790 Diverticulosis of intestine, part unspecified, without perforation or abscess without bleeding: Secondary | ICD-10-CM | POA: Diagnosis not present

## 2017-01-25 DIAGNOSIS — G7000 Myasthenia gravis without (acute) exacerbation: Secondary | ICD-10-CM | POA: Diagnosis not present

## 2017-01-25 DIAGNOSIS — M1990 Unspecified osteoarthritis, unspecified site: Secondary | ICD-10-CM | POA: Insufficient documentation

## 2017-01-25 DIAGNOSIS — F17210 Nicotine dependence, cigarettes, uncomplicated: Secondary | ICD-10-CM | POA: Insufficient documentation

## 2017-01-25 DIAGNOSIS — Z8719 Personal history of other diseases of the digestive system: Secondary | ICD-10-CM | POA: Insufficient documentation

## 2017-01-25 DIAGNOSIS — E876 Hypokalemia: Secondary | ICD-10-CM

## 2017-01-25 DIAGNOSIS — F329 Major depressive disorder, single episode, unspecified: Secondary | ICD-10-CM | POA: Diagnosis not present

## 2017-01-25 DIAGNOSIS — R002 Palpitations: Secondary | ICD-10-CM | POA: Diagnosis present

## 2017-01-25 DIAGNOSIS — R945 Abnormal results of liver function studies: Secondary | ICD-10-CM | POA: Insufficient documentation

## 2017-01-25 DIAGNOSIS — K580 Irritable bowel syndrome with diarrhea: Secondary | ICD-10-CM | POA: Diagnosis not present

## 2017-01-25 DIAGNOSIS — G4709 Other insomnia: Secondary | ICD-10-CM | POA: Diagnosis not present

## 2017-01-25 DIAGNOSIS — R946 Abnormal results of thyroid function studies: Secondary | ICD-10-CM | POA: Diagnosis not present

## 2017-01-25 DIAGNOSIS — C61 Malignant neoplasm of prostate: Secondary | ICD-10-CM | POA: Diagnosis present

## 2017-01-25 DIAGNOSIS — R5383 Other fatigue: Secondary | ICD-10-CM | POA: Diagnosis not present

## 2017-01-25 DIAGNOSIS — I10 Essential (primary) hypertension: Secondary | ICD-10-CM | POA: Diagnosis present

## 2017-01-25 DIAGNOSIS — R2689 Other abnormalities of gait and mobility: Secondary | ICD-10-CM | POA: Insufficient documentation

## 2017-01-25 DIAGNOSIS — M5137 Other intervertebral disc degeneration, lumbosacral region: Secondary | ICD-10-CM | POA: Diagnosis not present

## 2017-01-25 DIAGNOSIS — Z8546 Personal history of malignant neoplasm of prostate: Secondary | ICD-10-CM | POA: Insufficient documentation

## 2017-01-25 DIAGNOSIS — A09 Infectious gastroenteritis and colitis, unspecified: Secondary | ICD-10-CM | POA: Diagnosis not present

## 2017-01-25 DIAGNOSIS — I2510 Atherosclerotic heart disease of native coronary artery without angina pectoris: Secondary | ICD-10-CM | POA: Diagnosis not present

## 2017-01-25 DIAGNOSIS — K219 Gastro-esophageal reflux disease without esophagitis: Secondary | ICD-10-CM | POA: Diagnosis present

## 2017-01-25 DIAGNOSIS — R531 Weakness: Secondary | ICD-10-CM

## 2017-01-25 DIAGNOSIS — Z882 Allergy status to sulfonamides status: Secondary | ICD-10-CM | POA: Diagnosis not present

## 2017-01-25 DIAGNOSIS — R197 Diarrhea, unspecified: Secondary | ICD-10-CM | POA: Diagnosis present

## 2017-01-25 DIAGNOSIS — R339 Retention of urine, unspecified: Secondary | ICD-10-CM | POA: Insufficient documentation

## 2017-01-25 DIAGNOSIS — D72829 Elevated white blood cell count, unspecified: Secondary | ICD-10-CM | POA: Diagnosis not present

## 2017-01-25 DIAGNOSIS — K591 Functional diarrhea: Secondary | ICD-10-CM | POA: Diagnosis not present

## 2017-01-25 DIAGNOSIS — R7989 Other specified abnormal findings of blood chemistry: Secondary | ICD-10-CM | POA: Diagnosis present

## 2017-01-25 DIAGNOSIS — I63312 Cerebral infarction due to thrombosis of left middle cerebral artery: Secondary | ICD-10-CM | POA: Diagnosis present

## 2017-01-25 DIAGNOSIS — Z885 Allergy status to narcotic agent status: Secondary | ICD-10-CM | POA: Insufficient documentation

## 2017-01-25 DIAGNOSIS — Z888 Allergy status to other drugs, medicaments and biological substances status: Secondary | ICD-10-CM | POA: Insufficient documentation

## 2017-01-25 DIAGNOSIS — Z8673 Personal history of transient ischemic attack (TIA), and cerebral infarction without residual deficits: Secondary | ICD-10-CM | POA: Diagnosis not present

## 2017-01-25 DIAGNOSIS — K5730 Diverticulosis of large intestine without perforation or abscess without bleeding: Secondary | ICD-10-CM | POA: Diagnosis present

## 2017-01-25 DIAGNOSIS — Z720 Tobacco use: Secondary | ICD-10-CM | POA: Diagnosis present

## 2017-01-25 DIAGNOSIS — G4700 Insomnia, unspecified: Secondary | ICD-10-CM | POA: Diagnosis present

## 2017-01-25 DIAGNOSIS — K559 Vascular disorder of intestine, unspecified: Secondary | ICD-10-CM | POA: Diagnosis present

## 2017-01-25 LAB — COMPREHENSIVE METABOLIC PANEL
ALK PHOS: 82 U/L (ref 38–126)
ALT: 74 U/L — AB (ref 17–63)
ANION GAP: 14 (ref 5–15)
AST: 76 U/L — ABNORMAL HIGH (ref 15–41)
Albumin: 3.2 g/dL — ABNORMAL LOW (ref 3.5–5.0)
BUN: 25 mg/dL — ABNORMAL HIGH (ref 6–20)
CALCIUM: 9.8 mg/dL (ref 8.9–10.3)
CO2: 27 mmol/L (ref 22–32)
CREATININE: 0.95 mg/dL (ref 0.61–1.24)
Chloride: 97 mmol/L — ABNORMAL LOW (ref 101–111)
Glucose, Bld: 119 mg/dL — ABNORMAL HIGH (ref 65–99)
Potassium: 2.3 mmol/L — CL (ref 3.5–5.1)
Sodium: 138 mmol/L (ref 135–145)
Total Bilirubin: 1.4 mg/dL — ABNORMAL HIGH (ref 0.3–1.2)
Total Protein: 6.2 g/dL — ABNORMAL LOW (ref 6.5–8.1)

## 2017-01-25 LAB — URINALYSIS, ROUTINE W REFLEX MICROSCOPIC
BILIRUBIN URINE: NEGATIVE
Glucose, UA: NEGATIVE mg/dL
HGB URINE DIPSTICK: NEGATIVE
KETONES UR: NEGATIVE mg/dL
Leukocytes, UA: NEGATIVE
NITRITE: NEGATIVE
PROTEIN: NEGATIVE mg/dL
Specific Gravity, Urine: 1.013 (ref 1.005–1.030)
pH: 7 (ref 5.0–8.0)

## 2017-01-25 LAB — CBC WITH DIFFERENTIAL/PLATELET
Basophils Absolute: 0 10*3/uL (ref 0.0–0.1)
Basophils Relative: 0 %
EOS ABS: 0 10*3/uL (ref 0.0–0.7)
EOS PCT: 0 %
HCT: 46.4 % (ref 39.0–52.0)
HEMOGLOBIN: 16.4 g/dL (ref 13.0–17.0)
LYMPHS ABS: 0.5 10*3/uL — AB (ref 0.7–4.0)
LYMPHS PCT: 4 %
MCH: 33.3 pg (ref 26.0–34.0)
MCHC: 35.3 g/dL (ref 30.0–36.0)
MCV: 94.3 fL (ref 78.0–100.0)
MONOS PCT: 1 %
Monocytes Absolute: 0.1 10*3/uL (ref 0.1–1.0)
Neutro Abs: 13.3 10*3/uL — ABNORMAL HIGH (ref 1.7–7.7)
Neutrophils Relative %: 95 %
PLATELETS: 176 10*3/uL (ref 150–400)
RBC: 4.92 MIL/uL (ref 4.22–5.81)
RDW: 15.1 % (ref 11.5–15.5)
WBC: 14 10*3/uL — AB (ref 4.0–10.5)

## 2017-01-25 LAB — C DIFFICILE QUICK SCREEN W PCR REFLEX
C DIFFICLE (CDIFF) ANTIGEN: POSITIVE — AB
C Diff toxin: NEGATIVE

## 2017-01-25 LAB — MAGNESIUM: MAGNESIUM: 1.9 mg/dL (ref 1.7–2.4)

## 2017-01-25 LAB — CLOSTRIDIUM DIFFICILE BY PCR: CDIFFPCR: NEGATIVE

## 2017-01-25 LAB — POTASSIUM
POTASSIUM: 4.4 mmol/L (ref 3.5–5.1)
Potassium: 2.3 mmol/L — CL (ref 3.5–5.1)

## 2017-01-25 LAB — LIPASE, BLOOD: LIPASE: 27 U/L (ref 11–51)

## 2017-01-25 MED ORDER — ONDANSETRON HCL 4 MG/2ML IJ SOLN
4.0000 mg | Freq: Four times a day (QID) | INTRAMUSCULAR | Status: DC | PRN
  Administered 2017-01-25: 4 mg via INTRAVENOUS
  Filled 2017-01-25: qty 2

## 2017-01-25 MED ORDER — POTASSIUM CHLORIDE CRYS ER 20 MEQ PO TBCR
20.0000 meq | EXTENDED_RELEASE_TABLET | ORAL | Status: AC
  Administered 2017-01-25 (×4): 20 meq via ORAL
  Filled 2017-01-25 (×4): qty 1

## 2017-01-25 MED ORDER — MAGNESIUM SULFATE 2 GM/50ML IV SOLN
2.0000 g | Freq: Once | INTRAVENOUS | Status: AC
Start: 2017-01-25 — End: 2017-01-26
  Administered 2017-01-26: 2 g via INTRAVENOUS
  Filled 2017-01-25 (×2): qty 50

## 2017-01-25 MED ORDER — POTASSIUM CHLORIDE 10 MEQ/100ML IV SOLN
10.0000 meq | Freq: Once | INTRAVENOUS | Status: AC
  Administered 2017-01-25: 10 meq via INTRAVENOUS
  Filled 2017-01-25: qty 100

## 2017-01-25 MED ORDER — POTASSIUM CHLORIDE 10 MEQ/100ML IV SOLN
10.0000 meq | INTRAVENOUS | Status: AC
  Administered 2017-01-25 (×5): 10 meq via INTRAVENOUS
  Filled 2017-01-25 (×6): qty 100

## 2017-01-25 MED ORDER — PREDNISONE 20 MG PO TABS
40.0000 mg | ORAL_TABLET | Freq: Every day | ORAL | Status: DC
  Administered 2017-01-26 – 2017-01-27 (×2): 40 mg via ORAL
  Filled 2017-01-25 (×2): qty 2

## 2017-01-25 MED ORDER — SODIUM CHLORIDE 0.9 % IV BOLUS (SEPSIS)
1000.0000 mL | Freq: Once | INTRAVENOUS | Status: AC
  Administered 2017-01-25: 1000 mL via INTRAVENOUS

## 2017-01-25 MED ORDER — ACETAMINOPHEN 650 MG RE SUPP: 650.0000 mg | Freq: Four times a day (QID) | RECTAL | Status: DC | PRN

## 2017-01-25 MED ORDER — DICYCLOMINE HCL 10 MG PO CAPS
10.0000 mg | ORAL_CAPSULE | Freq: Three times a day (TID) | ORAL | Status: DC
  Administered 2017-01-25 – 2017-01-27 (×5): 10 mg via ORAL
  Filled 2017-01-25 (×6): qty 1

## 2017-01-25 MED ORDER — HYDROCODONE-ACETAMINOPHEN 5-325 MG PO TABS
1.0000 | ORAL_TABLET | ORAL | Status: DC | PRN
  Administered 2017-01-26 – 2017-01-27 (×3): 1 via ORAL
  Filled 2017-01-25 (×2): qty 1
  Filled 2017-01-25: qty 2

## 2017-01-25 MED ORDER — MAGNESIUM SULFATE 50 % IJ SOLN: 2.0000 g | Freq: Once | INTRAMUSCULAR | Status: DC

## 2017-01-25 MED ORDER — DILTIAZEM HCL ER BEADS 240 MG PO CP24
360.0000 mg | ORAL_CAPSULE | Freq: Every day | ORAL | Status: DC
  Administered 2017-01-25: 360 mg via ORAL
  Filled 2017-01-25 (×2): qty 1

## 2017-01-25 MED ORDER — AZATHIOPRINE 50 MG PO TABS
50.0000 mg | ORAL_TABLET | Freq: Every day | ORAL | Status: DC
  Administered 2017-01-25 – 2017-01-26 (×2): 50 mg via ORAL
  Filled 2017-01-25 (×2): qty 1

## 2017-01-25 MED ORDER — ENOXAPARIN SODIUM 40 MG/0.4ML ~~LOC~~ SOLN
40.0000 mg | SUBCUTANEOUS | Status: DC
  Administered 2017-01-25 – 2017-01-26 (×2): 40 mg via SUBCUTANEOUS
  Filled 2017-01-25 (×2): qty 0.4

## 2017-01-25 MED ORDER — PANTOPRAZOLE SODIUM 40 MG PO TBEC
40.0000 mg | DELAYED_RELEASE_TABLET | Freq: Every day | ORAL | Status: DC
  Administered 2017-01-25 – 2017-01-27 (×3): 40 mg via ORAL
  Filled 2017-01-25 (×3): qty 1

## 2017-01-25 MED ORDER — ACETAMINOPHEN 325 MG PO TABS
650.0000 mg | ORAL_TABLET | Freq: Four times a day (QID) | ORAL | Status: DC | PRN
  Administered 2017-01-26 – 2017-01-27 (×2): 650 mg via ORAL
  Filled 2017-01-25 (×2): qty 2

## 2017-01-25 MED ORDER — SODIUM CHLORIDE 0.9 % IV SOLN
INTRAVENOUS | Status: DC
  Administered 2017-01-25 – 2017-01-27 (×4): via INTRAVENOUS

## 2017-01-25 MED ORDER — SACCHAROMYCES BOULARDII 250 MG PO CAPS
250.0000 mg | ORAL_CAPSULE | Freq: Two times a day (BID) | ORAL | Status: DC
  Administered 2017-01-25 – 2017-01-27 (×4): 250 mg via ORAL
  Filled 2017-01-25 (×4): qty 1

## 2017-01-25 MED ORDER — CHLORTHALIDONE 25 MG PO TABS
25.0000 mg | ORAL_TABLET | Freq: Every day | ORAL | Status: DC
  Administered 2017-01-25: 25 mg via ORAL
  Filled 2017-01-25 (×2): qty 1

## 2017-01-25 MED ORDER — METOPROLOL SUCCINATE ER 25 MG PO TB24
25.0000 mg | ORAL_TABLET | Freq: Two times a day (BID) | ORAL | Status: DC
  Administered 2017-01-25 – 2017-01-27 (×2): 25 mg via ORAL
  Filled 2017-01-25 (×4): qty 1

## 2017-01-25 MED ORDER — ONDANSETRON HCL 4 MG PO TABS
4.0000 mg | ORAL_TABLET | Freq: Four times a day (QID) | ORAL | Status: DC | PRN
Start: 2017-01-25 — End: 2017-01-27

## 2017-01-25 MED ORDER — METHYLPREDNISOLONE SODIUM SUCC 125 MG IJ SOLR
80.0000 mg | Freq: Once | INTRAMUSCULAR | Status: AC
  Administered 2017-01-25: 80 mg via INTRAVENOUS
  Filled 2017-01-25: qty 2

## 2017-01-25 NOTE — ED Notes (Signed)
Warm blanket and ice chips.

## 2017-01-25 NOTE — ED Notes (Signed)
PT checked for stool; clean at this time.

## 2017-01-25 NOTE — ED Triage Notes (Signed)
Pt from home; diarr 3 days. No recent hospitalizations or antbiotics. Has hx of ischemic colitis; denies pain. Too weak to get up this morning.

## 2017-01-25 NOTE — H&P (Signed)
History and Physical    [name] IRJ:[id number] DOB: [birth year] DOA: 01/25/2017   PCP: Hunter Lima, MD   Patient coming from:  Home    Chief Complaint: Diarrhea  HPI: [name] is a 76 y.o. male with medical history significant for Myasthenia Gravis, prior C. Difficile, Non obstructive CAD, History of duodenal ulcer, history of prostate cancer, IBS, HTN, presenting with 3 day history of semiformed stools., intermittent, without abdominal pain or blood in the stools. He was found by his daughter; at the time, due to his decreased ability to mobilize, he was surrounded by feces. He reports decreased appetite, subsequent weight loss, and denies vomiting although he has intermittent periods of nausea. The patienorand cough. He denies any lower extremity swelling. He denies any dysuria or hematuria. He feels very anxious  today. Denies any recent hospitalizations.  ED Course:  BP 101/72   Pulse (!) 109   Temp 97.7 F (36.5 C) (Oral)   Resp 18   SpO2 96%    received 40 mEq of KCL IV received 1 L of IV fluids, antiemetics since admission, the patient had one episode of stool, which were able to be cultured labs remarkable for potassium 2.3, lipase 27, mild acute on chronic elevation of transaminases, T bili  1.4 WBC 14 hemoglobin 16.4, platelets 176   Review of Systems:  As per HPI otherwise all other systems reviewed and are negative  Past Medical History:  Diagnosis Date  . Adenoma of left adrenal gland    stable per ct  . Coronary artery disease CARDIOLOGIST-- DR Rayann Heman   NON-OBSTRUCTIVE CAD--- 2005 Cath with 30% LAD, calcification/plaque of LAD, EF 60%  . DDD (degenerative disc disease), lumbosacral   . Depression   . Diverticulosis, sigmoid   . Eczema   . ED (erectile dysfunction)   . Encopresis   . GERD (gastroesophageal reflux disease)   . Hemorrhoids 2002  . History of duodenal ulcer   . History of esophageal dilatation    FOR STRICTURE 2008  . History of  gallstones   . History of kidney stones 2014   right ureteral lithotripsy 01/2013  . History of peptic ulcer   . History of prostate cancer    S/P RADICAL PROSTATECTOMY 2009  . Hyperlipidemia   . Hypertension   . IBS (irritable bowel syndrome)   . Ischemic colitis ([place])   . OA (osteoarthritis)    RIGHT SHOULDER/ HIPS  . PAC (premature atrial contraction)   . Peyronie's disease   . Right ureteral stone   . Rotator cuff tear arthropathy of both shoulders     Past Surgical History:  Procedure Laterality Date  . CARDIAC CATHETERIZATION  07-23-2003   Non-obstructive CAD/  30% narrowing ostium LM,  30-40% eccentric calcified plaque LAD, 20% narrowing RCA/  preserved LVF  . CARDIOVASCULAR STRESS TEST  12-23-2012  DR ALLRED   Low risk lexiscan study with small mild intensity, fixed inferior defect consistent with thinning, no ischemia/  normal LVF and wall motion, ef 66%  . COLONOSCOPY  last one 06-19-2011  . CYSTOSCOPY W/ RETROGRADES Right 07/19/2014   Procedure: CYSTOSCOPY WITH RETROGRADE PYELOGRAM;  Surgeon: Malka So, MD;  Location: [facility];  Service: Urology;  Laterality: Right;  . CYSTOSCOPY/RETROGRADE/URETEROSCOPY/STONE EXTRACTION WITH BASKET Right 07/19/2014   Procedure: RIGHT URETEROSCOPY/STONE EXTRACTION WITH BASKET;  Surgeon: Malka So, MD;  Location: [facility];  Service: Urology;  Laterality: Right;  . EP IMPLANTABLE DEVICE N/A  01/13/2016   Procedure: Loop Recorder Insertion;  Surgeon: Deboraha Sprang, MD;  Location: [facility];  Service: Cardiovascular;  Laterality: N/A;  . ESOPHAGOGASTRODUODENOSCOPY (EGD) WITH ESOPHAGEAL DILATION  02-04-2001  . EXTRACORPOREAL SHOCK WAVE LITHOTRIPSY Right 01-30-2013  . FLEXIBLE SIGMOIDOSCOPY N/A 03/19/2014   Procedure: FLEXIBLE SIGMOIDOSCOPY;  Surgeon: Inda Castle, MD;  Location: [place];  Service: Endoscopy;  Laterality: N/A;  . INGUINAL HERNIA REPAIR Bilateral 03-17-2010  . LOOP RECORDER  IMPLANT    . LUMBAR FUSION  03-26-2005   Revision laminectomy L5 - S1,  Laminectomy and Decompression L4-L5, fusion L4 -- S1  . LUMBAR LAMINECTOMY  1999   L5  -- S1  . NASAL SEPTUM SURGERY  YRS AGO  . RADICAL PROSTATECTOMY/ BILATERAL RETROPERITONEAL LYMPH NODE DISSECTION  12-29-2007  . REMOVAL BENIGN RIGHT EYELID LESION  12-19-2007  . SLING CYSTOURETHROPEXY ([facility])  10-12-2009  . TONSILLECTOMY AND ADENOIDECTOMY  as child  . TRANSTHORACIC ECHOCARDIOGRAM  07-13-2014   mild LVH/  ef 60-655,  grade I diastolic dysfunction/  trivial AR, MR and TR/  mild LAE    Social History Social History   Social History  . Marital status: Married    Spouse name: N/A  . Number of children: 2  . Years of education: N/A   Occupational History  . Retired Retired   Social History Main Topics  . Smoking status: Current Every Day Smoker    Packs/day: 1.00    Years: 53.00    Types: Cigarettes  . Smokeless tobacco: Never Used  . Alcohol use No  . Drug use: No  . Sexual activity: Not on file   Other Topics Concern  . Not on file   Social History Narrative   Lives with wife in an apartment.  Has 2 children.  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  Education: high school.     Allergies  Allergen Reactions  . Pseudoephedrine Palpitations and Other (See Comments)    "Heart races"  . Tape Itching and Other (See Comments)    tears skin, can use paper tape  . Sulfa Antibiotics Itching  . Sulfacetamide Sodium Itching  . Lodine [Etodolac] Diarrhea  . Oxycodone Other (See Comments)    "over sedates"    Family History  Problem Relation Age of Onset  . Heart attack Father 30  . Aneurysm Father   . Bone cancer Maternal Grandfather   . Colon cancer Maternal Aunt 34  . Lymphoma Maternal Aunt       Prior to Admission medications   Medication Sig Start Date End Date Taking? Authorizing Provider  azaTHIOprine (IMURAN) 50 MG tablet Take 1 tablet  daily for a 2 weeks, then increase to 2 tablet daily. 11/03/16   Patel, Arvin Collard K, DO  carbidopa-levodopa (SINEMET IR) 25-100 MG tablet Take 1 tablet by mouth 3 (three) times daily. 01/15/17   Patel, Arvin Collard K, DO  chlorthalidone (HYGROTON) 25 MG tablet TAKE 1 TABLET(25 MG) BY MOUTH DAILY 10/16/16   Minus Breeding, MD  dicyclomine (BENTYL) 10 MG capsule TAKE 1 CAPSULE BY MOUTH THREE TIMES DAILY BEFORE MEALS 08/04/16   Hunter Lima, MD  diltiazem [facility]) 360 MG 24 hr capsule Take 1 capsule (360 mg total) by mouth daily. 08/14/16   Minus Breeding, MD  fluticasone Asencion Islam) 50 MCG/ACT nasal spray Use  As directed 07/22/16   [provider]  HYDROcodone-acetaminophen (NORCO/VICODIN) 5-325 MG tablet TK 1 T PO Q 4 TO  6 H PRN P 11/27/16   [provider]  metoprolol succinate (TOPROL-XL) 25 MG 24 hr tablet Take 1 tablet (25 mg total) by mouth 2 (two) times daily. 07/25/15   Larey Dresser, MD  omeprazole (PRILOSEC) 20 MG capsule Take 1 capsule (20 mg total) by mouth 2 (two) times daily. 07/14/16   Hunter Lima, MD  predniSONE (DELTASONE) 20 MG tablet Take 30mg  (1.5 tablet) daily for 7 days, if no improvement, increase to 40mg  daily. 12/15/16   Patel, Arvin Collard K, DO  saccharomyces boulardii (FLORASTOR) 250 MG capsule Take 1 capsule (250 mg total) by mouth 2 (two) times daily. 07/17/16   Ladene Artist, MD    Physical Exam:  Vitals:   01/25/17 1100 01/25/17 1115 01/25/17 1130 01/25/17 1145  BP: 122/66 111/70 109/66 101/72  Pulse: (!) 114 (!) 109 (!) 111 (!) 109  Resp:      Temp:      TempSrc:      SpO2: 95% 93% 97% 96%   Constitutional: ill appearing, cachectic,anxious Eyes:  exophtalmia PERRL, lids and conjunctivae normal ENMT: Mucous membranes are moist, without exudate or lesions . Several missing teeth Neck: normal, supple, no masses, no thyromegaly Respiratory: clear to auscultation bilaterally, no wheezing, no crackles. Normal respiratory effort  Cardiovascular:  Tachy Regular  rate and rhythm, no murmurs, rubs or gallops. No extremity edema. 2+ pedal pulses. No carotid bruits.  Abdomen: Soft, non tender, No hepatosplenomegaly. Bowel sounds positive.  Musculoskeletal: no clubbing / cyanosis. Proximal and distal weakness in all extremities due to MG  Skin: no jaundice, No lesions.  Neurologic: Sensation intact  Strength equal in all extremities Psychiatric:   Alert and oriented x 3 anxious  mood.     Labs on Admission: I have personally reviewed following labs and imaging studies  CBC:  Recent Labs Lab 01/25/17 0949  WBC 14.0*  NEUTROABS 13.3*  HGB 16.4  HCT 46.4  MCV 94.3  PLT 962    Basic Metabolic Panel:  Recent Labs Lab 01/25/17 0949  NA 138  K 2.3*  CL 97*  CO2 27  GLUCOSE 119*  BUN 25*  CREATININE 0.95  CALCIUM 9.8    GFR: Estimated Creatinine Clearance: 50.6 mL/min (by C-G formula based on SCr of 0.95 mg/dL).  Liver Function Tests:  Recent Labs Lab 01/25/17 0949  AST 76*  ALT 74*  ALKPHOS 82  BILITOT 1.4*  PROT 6.2*  ALBUMIN 3.2*    Recent Labs Lab 01/25/17 0949  LIPASE 27   No results for input(s): AMMONIA in the last 168 hours.  Coagulation Profile: No results for input(s): INR, PROTIME in the last 168 hours.  Cardiac Enzymes: No results for input(s): CKTOTAL, CKMB, CKMBINDEX, TROPONINI in the last 168 hours.  BNP (last 3 results) No results for input(s): PROBNP in the last 8760 hours.  HbA1C: No results for input(s): HGBA1C in the last 72 hours.  CBG: No results for input(s): GLUCAP in the last 168 hours.  Lipid Profile: No results for input(s): CHOL, HDL, LDLCALC, TRIG, CHOLHDL, LDLDIRECT in the last 72 hours.  Thyroid Function Tests: No results for input(s): TSH, T4TOTAL, FREET4, T3FREE, THYROIDAB in the last 72 hours.  Anemia Panel: No results for input(s): VITAMINB12, FOLATE, FERRITIN, TIBC, IRON, RETICCTPCT in the last 72 hours.  Urine analysis:    Component Value Date/Time   COLORURINE  YELLOW 06/16/2014 1500   APPEARANCEUR CLEAR 06/16/2014 1500   LABSPEC 1.016 06/16/2014 1500   PHURINE 7.0 06/16/2014 1500  GLUCOSEU NEGATIVE 06/16/2014 1500   HGBUR MODERATE (A) 06/16/2014 1500   HGBUR negative 08/30/2009 1042   BILIRUBINUR NEGATIVE 06/16/2014 1500   BILIRUBINUR neg 02/01/2012 1603   KETONESUR NEGATIVE 06/16/2014 1500   PROTEINUR NEGATIVE 06/16/2014 1500   UROBILINOGEN 0.2 06/16/2014 1500   NITRITE NEGATIVE 06/16/2014 1500   LEUKOCYTESUR NEGATIVE 06/16/2014 1500    Sepsis Labs: @LABRCNTIP (procalcitonin:4,lacticidven:4) )No results found for this or any previous visit (from the past 240 hour(s)).   Radiological Exams on Admission: No results found.  EKG: Independently reviewed.  Assessment/Plan Active Problems:   ADENOCARCINOMA, PROSTATE   Tobacco abuse disorder   Essential hypertension   Diverticulosis of colon   PALPITATIONS, CHRONIC   CAD (coronary artery disease)   GERD (gastroesophageal reflux disease)   Ischemic colitis (HCC)   Cerebral infarction due to thrombosis of left middle cerebral artery (HCC)   Middle insomnia   Other fatigue   Low TSH level   Leukocytosis   Myasthenia gravis ([place])   Diarrhea  Multiple episodes of semi-formed stools. History of Ischemic Colitis, IBS and C.diff  DDx includes colitis,, versus med induced, as patient's prednisone has recently decreased from 40 mg to 20 mg 1 week ago-this is taken for his h/o Myasthenia Gravis  . Afebrile. VSS. Denies abdominal pain  C. Diff pending. WB 14 (prednisone). Lipase pending Received 1 L IVF. Had one episode of these stools at the ED   MedSurg observation for supportive treatment  Enteric precautions Stool culture Clear liquid diet and advance as tolerated  IVF at 100 cc/h  Immodium if C. Diff is negative if needed  Check CBC and CMET in am Solumedrol 80 mg IVx1 and resume higher dose of prednisone 40 mg daily in am  Continue Bentyl Continue probiotics Hold Imuran today, can  resume in am  GI consult if symptoms do not improve   Myasthenia Gravis, no apparent flare. Chronic fatigue, and muscle weakness.  Solumedrol 80 mg IVx1 and resume higher dose of prednisone 40 mg daily in am as stated above due to changes in stools  PT/OT   GERD, no acute symptoms Continue PPI  Hypokalemia, may be due to volume loss K was 2.3 on admission, received replenishment at the ED with 40 meq IV  Recheck K  Continue replenishment IV in view of decreased ability to tolerate large oral pills due to myasthenia  Check Mg Repeat CMET in am   Hypertension BP 130/83   Pulse 109    Continue home anti-hypertensive medications  Add Hydralazine Q6 hours as needed for BP 160/90   Acute on Chronic Abnormal LFTs, in the setting of dehydration.  Lipase 27 AST 76 ALT 74 alkaline phosphatase 82 No GI intervention indicated at this time.  Repeat LFTs  IVF     DVT prophylaxis: Lovenox  Code Status:   Full      Family Communication:  Discussed with patient Disposition Plan: Expect patient to be discharged to home after condition improves Consults called:    None  Admission status:  Medsurg  Obs   Bristyn Kulesza E, PA-C [facility]   01/25/2017, 12:10 PM

## 2017-01-25 NOTE — ED Notes (Signed)
Pt ambulated to restroom with 2 person assist and had BM. Stool is soft and formed and not diarr. Pt complains of weakness and generalized pain when staff attempt to assist him. Pt urinated while having BM and RN unable to obtain sample.

## 2017-01-25 NOTE — ED Notes (Signed)
Pt changed at this time; another soft formed stool

## 2017-01-25 NOTE — ED Notes (Signed)
Chux and brief placed at this time.

## 2017-01-25 NOTE — ED Provider Notes (Signed)
[facility] DEPT Provider Note   CSN: [id number] Arrival date & time: 01/25/17  3790     History   Chief Complaint Chief Complaint  Patient presents with  . Diarrhea    HPI [name] is a 76 y.o. male.  HPI  Patient's a 76 year old chronically ill male. He is presenting today with diarrhea. He reports it comes and goes. He reports he thinks going on for the last 3 days. He reports unable to make it better and time. He denies any recent travel, any recent antibiotic use, any recent hospitalizations. Patient does have myasthenia gravis.  He was recently seen for this. According to notes the recently decreased his prednisone. Patient reports he was too weak to stand up from bed today.   Past Medical History:  Diagnosis Date  . Adenoma of left adrenal gland    stable per ct  . Coronary artery disease CARDIOLOGIST-- DR Rayann Heman   NON-OBSTRUCTIVE CAD--- 2005 Cath with 30% LAD, calcification/plaque of LAD, EF 60%  . DDD (degenerative disc disease), lumbosacral   . Depression   . Diverticulosis, sigmoid   . Eczema   . ED (erectile dysfunction)   . Encopresis   . GERD (gastroesophageal reflux disease)   . Hemorrhoids 2002  . History of duodenal ulcer   . History of esophageal dilatation    FOR STRICTURE 2008  . History of gallstones   . History of kidney stones 2014   right ureteral lithotripsy 01/2013  . History of peptic ulcer   . History of prostate cancer    S/P RADICAL PROSTATECTOMY 2009  . Hyperlipidemia   . Hypertension   . IBS (irritable bowel syndrome)   . Ischemic colitis ([place])   . OA (osteoarthritis)    RIGHT SHOULDER/ HIPS  . PAC (premature atrial contraction)   . Peyronie's disease   . Right ureteral stone   . Rotator cuff tear arthropathy of both shoulders     Patient Active Problem List   Diagnosis Date Noted  . Myasthenia gravis ([place]) 01/25/2017  . Diarrhea 01/25/2017  . Irritable bowel syndrome with diarrhea   . Dehydration   . Leukocytosis  01/18/2017  . Low TSH level 12/24/2016  . Other fatigue 12/23/2016  . Myasthenia gravis in crisis ([place]) 07/27/2016  . Middle insomnia 12/19/2015  . Cerebral infarction due to thrombosis of left middle cerebral artery ([place]) 10/20/2015  . Obstructive chronic bronchitis with exacerbation ([place]) 09/30/2015  . Allergic rhinitis due to pollen 08/07/2015  . Ischemic colitis ([place]) 03/20/2014  . GERD (gastroesophageal reflux disease) 03/18/2014  . CAD (coronary artery disease) 12/10/2011  . General medical examination 07/17/2011  . Tobacco abuse disorder 02/13/2009  . PALPITATIONS, CHRONIC 02/04/2009  . LOW BACK PAIN SYNDROME 10/10/2008  . Diverticulosis of colon 04/10/2008  . ADENOCARCINOMA, PROSTATE 11/28/2007  . DEPRESSION 08/22/2007  . ERECTILE DYSFUNCTION 07/13/2006  . Essential hypertension 07/13/2006  . Peyronie's disease 07/13/2006    Past Surgical History:  Procedure Laterality Date  . CARDIAC CATHETERIZATION  07-23-2003   Non-obstructive CAD/  30% narrowing ostium LM,  30-40% eccentric calcified plaque LAD, 20% narrowing RCA/  preserved LVF  . CARDIOVASCULAR STRESS TEST  12-23-2012  DR ALLRED   Low risk lexiscan study with small mild intensity, fixed inferior defect consistent with thinning, no ischemia/  normal LVF and wall motion, ef 66%  . COLONOSCOPY  last one 06-19-2011  . CYSTOSCOPY W/ RETROGRADES Right 07/19/2014   Procedure: CYSTOSCOPY WITH RETROGRADE PYELOGRAM;  Surgeon: Malka So, MD;  Location: [facility];  Service: Urology;  Laterality: Right;  . CYSTOSCOPY/RETROGRADE/URETEROSCOPY/STONE EXTRACTION WITH BASKET Right 07/19/2014   Procedure: RIGHT URETEROSCOPY/STONE EXTRACTION WITH BASKET;  Surgeon: Malka So, MD;  Location: [facility];  Service: Urology;  Laterality: Right;  . EP IMPLANTABLE DEVICE N/A 01/13/2016   Procedure: Loop Recorder Insertion;  Surgeon: Deboraha Sprang, MD;  Location: [facility];  Service: Cardiovascular;   Laterality: N/A;  . ESOPHAGOGASTRODUODENOSCOPY (EGD) WITH ESOPHAGEAL DILATION  02-04-2001  . EXTRACORPOREAL SHOCK WAVE LITHOTRIPSY Right 01-30-2013  . FLEXIBLE SIGMOIDOSCOPY N/A 03/19/2014   Procedure: FLEXIBLE SIGMOIDOSCOPY;  Surgeon: Inda Castle, MD;  Location: [facility];  Service: Endoscopy;  Laterality: N/A;  . INGUINAL HERNIA REPAIR Bilateral 03-17-2010  . LOOP RECORDER IMPLANT    . LUMBAR FUSION  03-26-2005   Revision laminectomy L5 - S1,  Laminectomy and Decompression L4-L5, fusion L4 -- S1  . LUMBAR LAMINECTOMY  1999   L5  -- S1  . NASAL SEPTUM SURGERY  YRS AGO  . RADICAL PROSTATECTOMY/ BILATERAL RETROPERITONEAL LYMPH NODE DISSECTION  12-29-2007  . REMOVAL BENIGN RIGHT EYELID LESION  12-19-2007  . SLING CYSTOURETHROPEXY ([facility])  10-12-2009  . TONSILLECTOMY AND ADENOIDECTOMY  as child  . TRANSTHORACIC ECHOCARDIOGRAM  07-13-2014   mild LVH/  ef 60-655,  grade I diastolic dysfunction/  trivial AR, MR and TR/  mild LAE       Home Medications    Prior to Admission medications   Medication Sig Start Date End Date Taking? Authorizing Provider  albuterol (PROVENTIL HFA;VENTOLIN HFA) 108 (90 Base) MCG/ACT inhaler Inhale 2 puffs into the lungs every 6 (six) hours as needed for wheezing or shortness of breath.   Yes [provider]  aspirin-acetaminophen-caffeine (EXCEDRIN MIGRAINE) [phone] MG tablet Take 1-2 tablets by mouth 2 (two) times daily as needed for headache (pain).   Yes [provider]  azaTHIOprine (IMURAN) 50 MG tablet Take 1 tablet daily for a 2 weeks, then increase to 2 tablet daily. Patient taking differently: Take 100 mg by mouth daily.  11/03/16  Yes Patel, Donika K, DO  Calcium Carbonate-Vit D-Min (CALTRATE 600+D PLUS MINERALS) 600-800 MG-UNIT TABS Take 1 tablet by mouth 2 (two) times daily.   Yes [provider]  chlorthalidone (HYGROTON) 25 MG tablet TAKE 1 TABLET(25 MG) BY MOUTH DAILY 10/16/16  Yes Minus Breeding, MD    cholecalciferol (VITAMIN D) 1000 units tablet Take 1,000 Units by mouth daily.   Yes [provider]  dicyclomine (BENTYL) 10 MG capsule TAKE 1 CAPSULE BY MOUTH THREE TIMES DAILY BEFORE MEALS Patient taking differently: Take 10 mg by mouth 3 (three) times daily before meals.  08/04/16  Yes Janith Lima, MD  diltiazem [facility]) 360 MG 24 hr capsule Take 1 capsule (360 mg total) by mouth daily. 08/14/16  Yes Minus Breeding, MD  HYDROcodone-acetaminophen (NORCO/VICODIN) 5-325 MG tablet Take 1 tablet by mouth every 4 (four) hours as needed (pain).   Yes [provider]  Multiple Vitamin (MULTIVITAMIN WITH MINERALS) TABS tablet Take 1 tablet by mouth daily.   Yes [provider]  omeprazole (PRILOSEC) 20 MG capsule Take 1 capsule (20 mg total) by mouth 2 (two) times daily. 07/14/16  Yes Janith Lima, MD  oxymetazoline (AFRIN) 0.05 % nasal spray Place 1 spray into both nostrils 2 (two) times daily as needed for congestion.   Yes [provider]  predniSONE (DELTASONE) 20 MG tablet Take 30mg  (1.5 tablet) daily for 7 days,  if no improvement, increase to 40mg  daily. Patient taking differently: Take 20 mg by mouth daily with breakfast.  12/15/16  Yes Patel, Donika K, DO  carbidopa-levodopa (SINEMET IR) 25-100 MG tablet Take 1 tablet by mouth 3 (three) times daily. Patient taking differently: Take 0.5-1 tablets by mouth See admin instructions. Tapered course to start 02/06/17: Week 1 - take 1/2 tablet by mouth three times daily - 30 minutes before meals                                                     Week 2 - take 1/2 tablet before breakfast and lunch, take 1 tablet before supper                                                     Week 3 - take 1/2 tablet before breakfast and 1 tablet before lunch and supper                                                      Week 4 - take 1 tablet before breakfast, lunch and supper 01/15/17   Narda Amber K, DO  metoprolol succinate  (TOPROL-XL) 25 MG 24 hr tablet Take 1 tablet (25 mg total) by mouth 2 (two) times daily. Patient not taking: Reported on 01/25/2017 07/25/15   Larey Dresser, MD  saccharomyces boulardii (FLORASTOR) 250 MG capsule Take 1 capsule (250 mg total) by mouth 2 (two) times daily. Patient not taking: Reported on 01/25/2017 07/17/16   Ladene Artist, MD    Family History Family History  Problem Relation Age of Onset  . Heart attack Father 69  . Aneurysm Father   . Bone cancer Maternal Grandfather   . Colon cancer Maternal Aunt 62  . Lymphoma Maternal Aunt     Social History Social History  Substance Use Topics  . Smoking status: Current Every Day Smoker    Packs/day: 1.00    Years: 53.00    Types: Cigarettes  . Smokeless tobacco: Never Used  . Alcohol use No     Allergies   Pseudoephedrine; Tape; Sulfa antibiotics; Sulfacetamide sodium; Lodine [etodolac]; and Oxycodone   Review of Systems Review of Systems  Constitutional: Negative for activity change.  HENT: Negative for congestion.   Respiratory: Negative for shortness of breath.   Cardiovascular: Negative for chest pain.  Gastrointestinal: Positive for diarrhea and nausea. Negative for abdominal pain and vomiting.     Physical Exam Updated Vital Signs BP 139/82   Pulse (!) 113   Temp 97.7 F (36.5 C) (Oral)   Resp 18   SpO2 93%   Physical Exam  Constitutional: He is oriented to person, place, and time. He appears well-nourished.  HENT:  Head: Normocephalic.  Dry mucus membranes  No visual changes  Eyes: Pupils are equal, round, and reactive to light. Conjunctivae are normal.  Cardiovascular: Normal rate.   Pulmonary/Chest: Effort normal and breath sounds normal. No respiratory distress. He has no wheezes.  Abdominal: Soft. There is no  tenderness.  Musculoskeletal:  Global fatigue, proximal=distal  Neurological: He is oriented to person, place, and time.  Skin: Skin is warm and dry. He is not diaphoretic.    Psychiatric: He has a normal mood and affect. His behavior is normal.     ED Treatments / Results  Labs (all labs ordered are listed, but only abnormal results are displayed) Labs Reviewed  C DIFFICILE QUICK SCREEN W PCR REFLEX - Abnormal; Notable for the following:       Result Value   C Diff antigen POSITIVE (*)    All other components within normal limits  CBC WITH DIFFERENTIAL/PLATELET - Abnormal; Notable for the following:    WBC 14.0 (*)    Neutro Abs 13.3 (*)    Lymphs Abs 0.5 (*)    All other components within normal limits  COMPREHENSIVE METABOLIC PANEL - Abnormal; Notable for the following:    Potassium 2.3 (*)    Chloride 97 (*)    Glucose, Bld 119 (*)    BUN 25 (*)    Total Protein 6.2 (*)    Albumin 3.2 (*)    AST 76 (*)    ALT 74 (*)    Total Bilirubin 1.4 (*)    All other components within normal limits  POTASSIUM - Abnormal; Notable for the following:    Potassium 2.3 (*)    All other components within normal limits  CLOSTRIDIUM DIFFICILE BY PCR  GASTROINTESTINAL PANEL BY PCR, STOOL (REPLACES STOOL CULTURE)  LIPASE, BLOOD  MAGNESIUM  URINALYSIS, ROUTINE W REFLEX MICROSCOPIC  OSMOLALITY, URINE  POTASSIUM    EKG  EKG Interpretation None       Radiology No results found.  Procedures Procedures (including critical care time)  Medications Ordered in ED Medications  predniSONE (DELTASONE) tablet 40 mg (not administered)  azaTHIOprine (IMURAN) tablet 50 mg (not administered)  chlorthalidone (HYGROTON) tablet 25 mg (not administered)  diltiazem (TIAZAC) 24 hr capsule 360 mg (not administered)  dicyclomine (BENTYL) capsule 10 mg (not administered)  saccharomyces boulardii (FLORASTOR) capsule 250 mg (not administered)  pantoprazole (PROTONIX) EC tablet 40 mg (not administered)  metoprolol succinate (TOPROL-XL) 24 hr tablet 25 mg (not administered)  0.9 %  sodium chloride infusion ( Intravenous New Bag/Given 01/25/17 1230)  acetaminophen  (TYLENOL) tablet 650 mg (not administered)    Or  acetaminophen (TYLENOL) suppository 650 mg (not administered)  ondansetron (ZOFRAN) tablet 4 mg ( Oral See Alternative 01/25/17 1439)    Or  ondansetron (ZOFRAN) injection 4 mg (4 mg Intravenous Given 01/25/17 1439)  enoxaparin (LOVENOX) injection 40 mg (not administered)  HYDROcodone-acetaminophen (NORCO/VICODIN) 5-325 MG per tablet 1-2 tablet (not administered)  potassium chloride SA (K-DUR,KLOR-CON) CR tablet 20 mEq (not administered)  potassium chloride 10 mEq in 100 mL IVPB (not administered)  magnesium sulfate (IV Push/IM) injection 2 g (not administered)  sodium chloride 0.9 % bolus 1,000 mL (0 mLs Intravenous Stopped 01/25/17 1043)  potassium chloride 10 mEq in 100 mL IVPB (0 mEq Intravenous Stopped 01/25/17 1335)  potassium chloride 10 mEq in 100 mL IVPB (0 mEq Intravenous Stopped 01/25/17 1235)  methylPREDNISolone sodium succinate (SOLU-MEDROL) 125 mg/2 mL injection 80 mg (80 mg Intravenous Given 01/25/17 1440)     Initial Impression / Assessment and Plan / ED Course  I have reviewed the triage vital signs and the nursing notes.  Pertinent labs & imaging results that were available during my care of the patient were reviewed by me and considered in my medical decision making (see  chart for details).    Patient 76 year old male presenting with diarrhea. Patient has myasthenia gravis and had recent decrease in his prednisone. Patient has not started Sinemet yet. (last neurology note listed this a recommendaqtion)  Patient is presenting with 3 days of diarrhea. He feels like he is unable to move very well. We'll get baseline labs. Patient has no abdominal tenderness. We will send his stool for culture. No known risk factors for C. difficile, or other stool pathogens.   K was 2.2. Two lines of K given. Fluids given.   Will need to admit given weakness, low K and ongoing diarrhea.   Final Clinical Impressions(s) / ED Diagnoses   Final  diagnoses:  None    New Prescriptions New Prescriptions   No medications on file     Macarthur Critchley, MD 01/25/17 1645

## 2017-01-25 NOTE — ED Notes (Signed)
ED Provider at bedside. 

## 2017-01-25 NOTE — ED Notes (Signed)
Date and time results received: 01/25/17 1045 (use smartphrase ".now" to insert current time)  Test: potassium Critical Value: 2.3  Name of Provider Notified: Dr. Thomasene Lot Potassium runs x2

## 2017-01-25 NOTE — Progress Notes (Signed)
Patient arrived to [phone] alert and oriented, slightly drowsy, no pain reported, IV fluids infusing, VSS, foley cath in place, family at bedside, oriented to room and staff will continue to monitor. Placed on enteric precautions.

## 2017-01-26 ENCOUNTER — Encounter (HOSPITAL_COMMUNITY): Payer: Self-pay | Admitting: General Practice

## 2017-01-26 DIAGNOSIS — D72829 Elevated white blood cell count, unspecified: Secondary | ICD-10-CM

## 2017-01-26 DIAGNOSIS — E876 Hypokalemia: Secondary | ICD-10-CM

## 2017-01-26 DIAGNOSIS — R531 Weakness: Secondary | ICD-10-CM | POA: Diagnosis not present

## 2017-01-26 DIAGNOSIS — A09 Infectious gastroenteritis and colitis, unspecified: Secondary | ICD-10-CM | POA: Diagnosis not present

## 2017-01-26 DIAGNOSIS — G7000 Myasthenia gravis without (acute) exacerbation: Secondary | ICD-10-CM | POA: Diagnosis not present

## 2017-01-26 DIAGNOSIS — I10 Essential (primary) hypertension: Secondary | ICD-10-CM | POA: Diagnosis not present

## 2017-01-26 LAB — GASTROINTESTINAL PANEL BY PCR, STOOL (REPLACES STOOL CULTURE)
Adenovirus F40/41: NOT DETECTED
Astrovirus: NOT DETECTED
CAMPYLOBACTER SPECIES: NOT DETECTED
CRYPTOSPORIDIUM: NOT DETECTED
CYCLOSPORA CAYETANENSIS: NOT DETECTED
ENTAMOEBA HISTOLYTICA: NOT DETECTED
ENTEROTOXIGENIC E COLI (ETEC): NOT DETECTED
Enteroaggregative E coli (EAEC): NOT DETECTED
Enteropathogenic E coli (EPEC): DETECTED — AB
Giardia lamblia: NOT DETECTED
Norovirus GI/GII: NOT DETECTED
PLESIMONAS SHIGELLOIDES: NOT DETECTED
Rotavirus A: NOT DETECTED
SALMONELLA SPECIES: NOT DETECTED
SAPOVIRUS (I, II, IV, AND V): NOT DETECTED
SHIGA LIKE TOXIN PRODUCING E COLI (STEC): NOT DETECTED
SHIGELLA/ENTEROINVASIVE E COLI (EIEC): NOT DETECTED
VIBRIO CHOLERAE: NOT DETECTED
VIBRIO SPECIES: NOT DETECTED
YERSINIA ENTEROCOLITICA: NOT DETECTED

## 2017-01-26 LAB — CBC
HCT: 40.2 % (ref 39.0–52.0)
HEMOGLOBIN: 13.6 g/dL (ref 13.0–17.0)
MCH: 31.9 pg (ref 26.0–34.0)
MCHC: 33.8 g/dL (ref 30.0–36.0)
MCV: 94.4 fL (ref 78.0–100.0)
PLATELETS: 150 10*3/uL (ref 150–400)
RBC: 4.26 MIL/uL (ref 4.22–5.81)
RDW: 15.7 % — ABNORMAL HIGH (ref 11.5–15.5)
WBC: 20.5 10*3/uL — ABNORMAL HIGH (ref 4.0–10.5)

## 2017-01-26 LAB — COMPREHENSIVE METABOLIC PANEL
ALK PHOS: 53 U/L (ref 38–126)
ALT: 62 U/L (ref 17–63)
AST: 44 U/L — AB (ref 15–41)
Albumin: 2.6 g/dL — ABNORMAL LOW (ref 3.5–5.0)
Anion gap: 6 (ref 5–15)
BUN: 13 mg/dL (ref 6–20)
CHLORIDE: 110 mmol/L (ref 101–111)
CO2: 23 mmol/L (ref 22–32)
CREATININE: 0.62 mg/dL (ref 0.61–1.24)
Calcium: 8.6 mg/dL — ABNORMAL LOW (ref 8.9–10.3)
GLUCOSE: 135 mg/dL — AB (ref 65–99)
Potassium: 4.6 mmol/L (ref 3.5–5.1)
Sodium: 139 mmol/L (ref 135–145)
Total Bilirubin: 1.1 mg/dL (ref 0.3–1.2)
Total Protein: 5.1 g/dL — ABNORMAL LOW (ref 6.5–8.1)

## 2017-01-26 LAB — OSMOLALITY, URINE: OSMOLALITY UR: 477 mosm/kg (ref 300–900)

## 2017-01-26 LAB — MAGNESIUM: Magnesium: 3 mg/dL — ABNORMAL HIGH (ref 1.7–2.4)

## 2017-01-26 MED ORDER — AZATHIOPRINE 50 MG PO TABS
100.0000 mg | ORAL_TABLET | Freq: Every day | ORAL | Status: DC
  Administered 2017-01-27: 100 mg via ORAL
  Filled 2017-01-26: qty 2

## 2017-01-26 MED ORDER — LOPERAMIDE HCL 2 MG PO CAPS
2.0000 mg | ORAL_CAPSULE | ORAL | Status: DC | PRN
  Filled 2017-01-26: qty 1

## 2017-01-26 MED ORDER — DILTIAZEM HCL ER COATED BEADS 180 MG PO CP24
360.0000 mg | ORAL_CAPSULE | Freq: Every day | ORAL | Status: DC
  Administered 2017-01-26 – 2017-01-27 (×2): 360 mg via ORAL
  Filled 2017-01-26 (×3): qty 2

## 2017-01-26 NOTE — Care Management Note (Signed)
Case Management Note  Patient Details  Name: [name] MRN: [id number] Date of Birth: 1941/05/03  Subjective/Objective:                    Action/Plan:  Discussed PT recommendation of OP PT at present patient declined  Expected Discharge Date:                  Expected Discharge Plan:  Home/Self Care  In-House Referral:     Discharge planning Services  CM Consult  Post Acute Care Choice:    Choice offered to:  Patient  DME Arranged:    DME Agency:     HH Arranged:    [place] Agency:     Status of Service:  In process, will continue to follow  If discussed at Long Length of Stay Meetings, dates discussed:    Additional Comments:  Marilu Favre, RN 01/26/2017, 3:22 PM

## 2017-01-26 NOTE — Progress Notes (Signed)
CRITICAL VALUE ALERT  Critical Value:  Stool pcr positive for Enteropathogenic E coli   Date & Time Notied:  01/26/17 1548  Provider Notified: MD Eliseo Squires  Orders Received/Actions taken: paged MD Eliseo Squires, did not receive call back

## 2017-01-26 NOTE — Evaluation (Addendum)
Physical Therapy Evaluation Patient Details Name: [name] MRN: [id number] DOB: July 13, 1940 Today's Date: 01/26/2017   History of Present Illness  Pt is a 76 y.o. male admitted on 01/25/17 with 3-day history of semiformed stools; was found at home by daughter with increased weakness. PMH significant for myasthenia gravis, prior C.difficile, CAD, prostate cancer, IBS, HTN.   Clinical Impression  Pt presents to PT with a decrease in functional mobility secondary to above. PTA, pt indep and lives at home with wife and family who are able to provide assist if needed. Able to amb with RW and min guard for balance; pt educ on benefit of using RW at home to decrease risk for falls secondary to weakness from myasthenia gravis. Discussed my recommendation for continued rehab at OP neuro if agreeable, although pt currently refusing this. Pt would benefit from continued acute PT services to maximize functional mobility and independence prior to d/c home.  Per RN request, BP measurements (pt asymptomatic): Reclined in bed - 115/63 Sitting EOB - 121/67 Standing 126/63    Follow Up Recommendations Outpatient PT (Neuro OP - if agreeable)    Equipment Recommendations  None recommended by PT    Recommendations for Other Services OT consult     Precautions / Restrictions Precautions Precautions: Fall Restrictions Weight Bearing Restrictions: No      Mobility  Bed Mobility Overal bed mobility: Modified Independent             General bed mobility comments: Reliant on UE to push into sitting  Transfers Overall transfer level: Needs assistance Equipment used: Rolling walker (2 wheeled) Transfers: Sit to/from Stand Sit to Stand: Supervision         General transfer comment: Stood with RW and supervision; able to stand at bedside for measurement of orthostatics  Ambulation/Gait Ambulation/Gait assistance: Min guard Ambulation Distance (Feet): 40 Feet Assistive device: Rolling  walker (2 wheeled) Gait Pattern/deviations: Step-through pattern;Decreased stride length Gait velocity: Decreased Gait velocity interpretation: <1.8 ft/sec, indicative of risk for recurrent falls General Gait Details: Amb with RW and min guard for balance; further mobility limited secondary to fatigue.   Stairs            Wheelchair Mobility    Modified Rankin (Stroke Patients Only)       Balance Overall balance assessment: Needs assistance Sitting-balance support: No upper extremity supported;Feet supported Sitting balance-Leahy Scale: Good     Standing balance support: During functional activity Standing balance-Leahy Scale: Fair                               Pertinent Vitals/Pain Pain Assessment: No/denies pain    Home Living Family/patient expects to be discharged to:: Private residence Living Arrangements: Spouse/significant other;Children Available Help at Discharge: Family;Available 24 hours/day Type of Home: House Home Access: Level entry     Home Layout: Multi-level Home Equipment: Walker - standard;Cane - single point      Prior Function Level of Independence: Independent         Comments: Denies recent falls     Hand Dominance        Extremity/Trunk Assessment   Upper Extremity Assessment Upper Extremity Assessment: Generalized weakness    Lower Extremity Assessment Lower Extremity Assessment: RLE deficits/detail;LLE deficits/detail RLE Deficits / Details: R hip flexion 4/5, knee ext 4/5, knee flex 5/5, ankle DF/PF 5/5; pt reports intermittent weakness at home secondary to MG LLE Deficits / Details: L hip  flexion 4/5, knee ext 4/5, knee flex 5/5, ankle DF/PF 5/5; pt reports intermittent weakness at home secondary to [facility]       Communication   Communication: No difficulties  Cognition Arousal/Alertness: Awake/alert Behavior During Therapy: WFL for tasks assessed/performed Overall Cognitive Status: Within Functional Limits  for tasks assessed                                        General Comments      Exercises     Assessment/Plan    PT Assessment Patient needs continued PT services  PT Problem List Decreased strength;Decreased range of motion;Decreased activity tolerance;Decreased balance;Decreased mobility;Decreased knowledge of use of DME;Decreased safety awareness       PT Treatment Interventions DME instruction;Gait training;Therapeutic activities;Therapeutic exercise;Patient/family education;Stair training;Functional mobility training    PT Goals (Current goals can be found in the Care Plan section)  Acute Rehab PT Goals Patient Stated Goal: Return home PT Goal Formulation: With patient Time For Goal Achievement: 02/09/17 Potential to Achieve Goals: Good    Frequency Min 3X/week   Barriers to discharge        Co-evaluation               AM-PAC PT "6 Clicks" Daily Activity  Outcome Measure Difficulty turning over in bed (including adjusting bedclothes, sheets and blankets)?: None Difficulty moving from lying on back to sitting on the side of the bed? : None Difficulty sitting down on and standing up from a chair with arms (e.g., wheelchair, bedside commode, etc,.)?: A Little Help needed moving to and from a bed to chair (including a wheelchair)?: A Little Help needed walking in hospital room?: A Little Help needed climbing 3-5 steps with a railing? : A Little 6 Click Score: 20    End of Session Equipment Utilized During Treatment: Gait belt Activity Tolerance: Patient tolerated treatment well;Patient limited by fatigue Patient left: in chair;with call bell/phone within reach Nurse Communication: Mobility status PT Visit Diagnosis: Other abnormalities of gait and mobility (R26.89)    Time: 8588-5027 PT Time Calculation (min) (ACUTE ONLY): 29 min   Charges:   PT Evaluation $PT Eval Moderate Complexity: 1 Mod PT Treatments $Therapeutic Activity: 8-22  mins   PT G Codes:   PT G-Codes **NOT FOR INPATIENT CLASS** Functional Assessment Tool Used: AM-PAC 6 Clicks Basic Mobility Functional Limitation: Mobility: Walking and moving around Mobility: Walking and Moving Around Current Status ([id number]): At least 20 percent but less than 40 percent impaired, limited or restricted Mobility: Walking and Moving Around Goal Status [phone]): At least 1 percent but less than 20 percent impaired, limited or restricted   Mabeline Caras, PT, DPT [phone] Acute Rehab  Derry Lory 01/26/2017, 3:11 PM

## 2017-01-26 NOTE — Progress Notes (Signed)
Pt is to weak to stand to do orthostatic vitals

## 2017-01-26 NOTE — Progress Notes (Signed)
NIF -30, VC 1.5L

## 2017-01-26 NOTE — Care Management Obs Status (Signed)
[place]   Patient Details  Name: [name] MRN: [id number] Date of Birth: [birth year]   Medicare Observation Status Notification Given:  Yes   Patient signed one copy Marilu Favre, RN 01/26/2017, 1:21 PM

## 2017-01-26 NOTE — Progress Notes (Signed)
PROGRESS NOTE    KAILAN LAWS  JJO:[id number] DOB: Sep 13, 1940 DOA: 01/25/2017 PCP: Janith Lima, MD   Outpatient Specialists:     Brief Narrative:  DESTEN MANOR is a 76 y.o. male with medical history significant for Myasthenia Gravis, prior C. Difficile, Non obstructive CAD, History of duodenal ulcer, history of prostate cancer, IBS, HTN, presenting with 3 day history of semiformed stools., intermittent, without abdominal pain or blood in the stools. He was found by his daughter; at the time, due to his decreased ability to mobilize, he was surrounded by feces. He reports decreased appetite, subsequent weight loss, and denies vomiting although he has intermittent periods of nausea. He denies any lower extremity swelling. He denies any dysuria or hematuria. He feels very anxious  today. Denies any recent hospitalizations.   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 History of Ischemic Colitis, IBS and C.diff   -c diff PCR negative  Enteric precautions until GI pathogen panel back and stools more formed -advance diet as tolerated -IVF  -PRN immodium -given Solumedrol 80 mg IVx1 and resume higher dose of prednisone 40 mg daily in am  Continue Bentyl Continue probiotics -has seen Dr. Fuller Plan in past but says he doesn't want to have a colonoscopy since he is not due  Myasthenia Gravis, ? flare. Chronic fatigue, and muscle weakness.  Solumedrol 80 mg IVx1 and resume higher dose of prednisone 40 mg daily in am as stated above due to changes  in stools  PT/OT- no appreciable weakness in bed currently -discussed with Dr. Posey Pronto (neuro at [facility])-- if not better in AM, may need neuro hospitalist to see -Patient's Mg was over re-pleated in ER  GERD, no acute symptoms Continue PPI  Hypokalemia - from medication +loose stools -has bee repleted  Leukocytosis -? From steroids -trend  Hypertension   Continue home anti-hypertensive medications except for diuetic   Acute on Chronic Abnormal LFTs, in the setting of dehydration. -trending down  reported urinary retention Foley placed in ER -will try voiding trial once patient able to stand  DVT prophylaxis:  Lovenox   Code Status: Full Code   Family Communication: Daughter at bedside  Disposition Plan:     Consultants:   PT    Subjective: Patient discouraged and saying "I'll just go home and let this pass"  Objective: Vitals:   01/25/17 1500 01/25/17 1710 01/25/17 2126 01/26/17 0607  BP: 139/82 129/74 122/76 132/80  Pulse: (!) 113 99 95 77  Resp:  19 18 18   Temp:  97.9 F (36.6 C) 98.5 F (36.9 C) 97.8 F (36.6 C)  TempSrc:  Oral Oral Oral  SpO2: 93% 93% 95% 94%    Intake/Output Summary (Last 24 hours) at 01/26/17 1253 Last data filed at 01/26/17 0600  Gross per 24 hour  Intake             1750 ml  Output             2025 ml  Net             -  275 ml   There were no vitals filed for this visit.  Examination:  General exam: irritable Respiratory system: no wheezing Cardiovascular system: rrr Gastrointestinal system: +BS- overactive Central nervous system: Alert- no increased work of breathing Extremities: LE strength same and 4/5 Psychiatry: poor insight and mood    Data Reviewed: I have personally reviewed following labs and imaging studies  CBC:  Recent Labs Lab 01/25/17 0949 01/26/17 0233  WBC 14.0* 20.5*  NEUTROABS 13.3*  --   HGB 16.4 13.6  HCT 46.4 40.2  MCV 94.3 94.4  PLT 176 956   Basic Metabolic Panel:  Recent  Labs Lab 01/25/17 0949 01/25/17 1311 01/25/17 1455 01/25/17 2242 01/26/17 0233  NA 138  --   --   --  139  K 2.3*  --  2.3* 4.4 4.6  CL 97*  --   --   --  110  CO2 27  --   --   --  23  GLUCOSE 119*  --   --   --  135*  BUN 25*  --   --   --  13  CREATININE 0.95  --   --   --  0.62  CALCIUM 9.8  --   --   --  8.6*  MG  --  1.9  --   --  3.0*   GFR: Estimated Creatinine Clearance: 60 mL/min (by C-G formula based on SCr of 0.62 mg/dL). Liver Function Tests:  Recent Labs Lab 01/25/17 0949 01/26/17 0233  AST 76* 44*  ALT 74* 62  ALKPHOS 82 53  BILITOT 1.4* 1.1  PROT 6.2* 5.1*  ALBUMIN 3.2* 2.6*    Recent Labs Lab 01/25/17 0949  LIPASE 27   No results for input(s): AMMONIA in the last 168 hours. Coagulation Profile: No results for input(s): INR, PROTIME in the last 168 hours. Cardiac Enzymes: No results for input(s): CKTOTAL, CKMB, CKMBINDEX, TROPONINI in the last 168 hours. BNP (last 3 results) No results for input(s): PROBNP in the last 8760 hours. HbA1C: No results for input(s): HGBA1C in the last 72 hours. CBG: No results for input(s): GLUCAP in the last 168 hours. Lipid Profile: No results for input(s): CHOL, HDL, LDLCALC, TRIG, CHOLHDL, LDLDIRECT in the last 72 hours. Thyroid Function Tests: No results for input(s): TSH, T4TOTAL, FREET4, T3FREE, THYROIDAB in the last 72 hours. Anemia Panel: No results for input(s): VITAMINB12, FOLATE, FERRITIN, TIBC, IRON, RETICCTPCT in the last 72 hours. Urine analysis:    Component Value Date/Time   COLORURINE YELLOW 01/25/2017 1929   APPEARANCEUR CLEAR 01/25/2017 1929   LABSPEC 1.013 01/25/2017 [place] 7.0 01/25/2017 1929   GLUCOSEU NEGATIVE 01/25/2017 1929   HGBUR NEGATIVE 01/25/2017 1929   HGBUR negative 08/30/2009 1042   BILIRUBINUR NEGATIVE 01/25/2017 1929   BILIRUBINUR neg 02/01/2012 1603   KETONESUR NEGATIVE 01/25/2017 1929   PROTEINUR NEGATIVE 01/25/2017 1929   UROBILINOGEN 0.2 06/16/2014 1500    NITRITE NEGATIVE 01/25/2017 [place] NEGATIVE 01/25/2017 1929     ) Recent Results (from the past 240 hour(s))  C difficile quick scan w PCR reflex     Status: Abnormal   Collection Time: 01/25/17 12:23 PM  Result Value Ref Range Status   C Diff antigen POSITIVE (A) NEGATIVE Final   C Diff toxin NEGATIVE NEGATIVE Final   C Diff interpretation Results are indeterminate. See PCR results.  Final  Clostridium Difficile by PCR     Status: None   Collection Time: 01/25/17  12:23 PM  Result Value Ref Range Status   Toxigenic C Difficile by pcr NEGATIVE NEGATIVE Final    Comment: Patient is colonized with non toxigenic C. difficile. May not need treatment unless significant symptoms are present.      Anti-infectives    None       Radiology Studies: No results found.      Scheduled Meds: . [START ON 01/27/2017] azaTHIOprine  100 mg Oral Daily  . dicyclomine  10 mg Oral TID AC  . diltiazem  360 mg Oral Daily  . enoxaparin (LOVENOX) injection  40 mg Subcutaneous Q24H  . metoprolol succinate  25 mg Oral BID  . pantoprazole  40 mg Oral Daily  . predniSONE  40 mg Oral Q breakfast  . saccharomyces boulardii  250 mg Oral BID   Continuous Infusions: . sodium chloride 100 mL/hr at 01/25/17 2329     LOS: 0 days    Time spent: 25 min    [place], DO [facility] Pager [phone]  If 7PM-7AM, please contact night-coverage [email or web address] Password [facility] 01/26/2017, 12:53 PM

## 2017-01-26 NOTE — Progress Notes (Signed)
NIF -26, V/C 1255 mL.  Pt gave good effort.

## 2017-01-27 DIAGNOSIS — I10 Essential (primary) hypertension: Secondary | ICD-10-CM | POA: Diagnosis not present

## 2017-01-27 DIAGNOSIS — D72829 Elevated white blood cell count, unspecified: Secondary | ICD-10-CM | POA: Diagnosis not present

## 2017-01-27 DIAGNOSIS — A09 Infectious gastroenteritis and colitis, unspecified: Secondary | ICD-10-CM

## 2017-01-27 DIAGNOSIS — R531 Weakness: Secondary | ICD-10-CM | POA: Diagnosis not present

## 2017-01-27 DIAGNOSIS — G7000 Myasthenia gravis without (acute) exacerbation: Secondary | ICD-10-CM | POA: Diagnosis not present

## 2017-01-27 DIAGNOSIS — R5383 Other fatigue: Secondary | ICD-10-CM | POA: Diagnosis not present

## 2017-01-27 DIAGNOSIS — E876 Hypokalemia: Secondary | ICD-10-CM | POA: Diagnosis not present

## 2017-01-27 LAB — BASIC METABOLIC PANEL
ANION GAP: 6 (ref 5–15)
BUN: 14 mg/dL (ref 6–20)
CALCIUM: 8.6 mg/dL — AB (ref 8.9–10.3)
CHLORIDE: 108 mmol/L (ref 101–111)
CO2: 23 mmol/L (ref 22–32)
Creatinine, Ser: 0.49 mg/dL — ABNORMAL LOW (ref 0.61–1.24)
GFR calc Af Amer: >60 mL/min (ref >60)
GFR calc non Af Amer: >60 mL/min (ref >60)
GLUCOSE: 107 mg/dL — AB (ref 65–99)
POTASSIUM: 4.2 mmol/L (ref 3.5–5.1)
Sodium: 137 mmol/L (ref 135–145)

## 2017-01-27 LAB — CBC
HEMATOCRIT: 37.2 % — AB (ref 39.0–52.0)
Hemoglobin: 12.3 g/dL — ABNORMAL LOW (ref 13.0–17.0)
MCH: 31.5 pg (ref 26.0–34.0)
MCHC: 33.1 g/dL (ref 30.0–36.0)
MCV: 95.1 fL (ref 78.0–100.0)
Platelets: 127 10*3/uL — ABNORMAL LOW (ref 150–400)
RBC: 3.91 MIL/uL — ABNORMAL LOW (ref 4.22–5.81)
RDW: 16.1 % — AB (ref 11.5–15.5)
WBC: 14.9 10*3/uL — AB (ref 4.0–10.5)

## 2017-01-27 MED ORDER — PREDNISONE 20 MG PO TABS: 20.0000 mg | ORAL_TABLET | Freq: Every day | ORAL | Status: AC

## 2017-01-27 NOTE — Care Management Note (Signed)
Case Management Note  Patient Details  Name: [name] MRN: [id number] Date of Birth: [birth year]  Subjective/Objective:                    Action/Plan:  Discussed discharge with Dr Eliseo Squires.   Spoke with patient and daughter [name] at bedside. Discussed discharge today. Both are in agreement patient will discharge to home. Patient still refusing outpatient PT, however agrees to HHPT/OT. Daughter in agreement and requesting aide also and wheel chair .   Discussed with Dr Eliseo Squires, orders entered. Daughter requested to speak to Dr Eliseo Squires. Dr Eliseo Squires aware and will call into patient's room. Daughter aware.  Choice for home health provided , they would like [facility]. Referral made.  Expected Discharge Date:                  Expected Discharge Plan:  [place]  In-House Referral:     Discharge planning Services  CM Consult  Post Acute Care Choice:  Durable Medical Equipment, Home Health Choice offered to:  Patient, Adult Children  DME Arranged:  Youth worker wheelchair with seat cushion DME Agency:  [place]:  PT, OT, Nurse's Aide [place] Agency:  [facility] [place]  Status of Service:  Completed, signed off  If discussed at [place] of Stay Meetings, dates discussed:    Additional Comments:  Marilu Favre, RN 01/27/2017, 2:09 PM

## 2017-01-27 NOTE — Care Management (Signed)
    Durable Medical Equipment        Start     Ordered   01/27/17 1336  For home use only DME lightweight manual wheelchair with seat cushion  Once    Comments:  Patient suffers from [place] which impairs their ability to perform daily activities like ambulating in the home.  A cane will not resolve  issue with performing activities of daily living. A wheelchair will allow patient to safely perform daily activities. Patient is not able to propel themselves in the home using a standard weight wheelchair due to ADENOCARCINOMA, PROSTATE Patient can self propel in the lightweight wheelchair.  Accessories: elevating leg rests (ELRs), wheel locks, extensions and anti-tippers.   01/27/17 1341

## 2017-01-27 NOTE — Discharge Summary (Signed)
Physician Discharge Summary  JAYDENN BOCCIO WIO:[id number] DOB: 1940/07/28 DOA: 01/25/2017  PCP: Janith Lima, MD  Admit date: 01/25/2017 Discharge date: 01/27/2017   Recommendations for Outpatient Follow-Up:   1. Close neurology follow up 2. 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  Discharge Condition: Improved.  Diet recommendation: Low sodium, heart healthy  Wound care: None.   History of Present Illness:   KELSEY EDMAN is a 76 y.o. male who presents with likely viral gastroenteritis vs IBS flare w/ concurrent chronic steroid withdrawal. H/o IBS, colitis and Cdiff.    Hospital Course by Problem:   Multiple episodes of semi-formed stools. History of Ischemic Colitis, IBS and C.diff -c diff PCR negative  - EPEC found in stool-- symptomatic treatment-- no further stool issues -Continue Bentyl Continue probiotics  Myasthenia Gravis. Chronic fatigue, and muscle weakness.  Solumedrol 80 mg IVx1 and resume higher dose of prednisone 40 mg daily in am as stated above due to changes in stools  PT/OT- no appreciable weakness in bed currently -discussed with Dr. Posey Pronto (neuro at [facility])- -Patient's Mg was over re-pleated in ER -resume 20 mg on d/c of steroids  GERD,no acute symptoms Continue PPI  Hypokalemia - from medication +loose stools -has been repleted  Leukocytosis -? From steroids -trend to normal as outpatient  Hypertension Continue home anti-hypertensive medications except for diuetic     Acute on Chronic Abnormal LFTs, in the setting of dehydration. -trending down -outpatinet follow up  reported urinary retention Foley d/c'd no further issues with retention    Medical Consultants:    Neuro   Discharge Exam:   Vitals:   01/27/17 0552 01/27/17 1351  BP: 137/77 130/70  Pulse: 78 79  Resp: 17 17  Temp: 98.3 F (36.8 C) 98.3 F (36.8 C)  SpO2: 92% 94%   Vitals:   01/26/17 2031 01/27/17 0552 01/27/17 0615 01/27/17 1351  BP: 117/67 137/77  130/70  Pulse: 63 78  79  Resp: 18 17  17   Temp: 98.2 F (36.8 C) 98.3 F (36.8 C)  98.3 F (36.8 C)  TempSrc: Oral Oral  Oral  SpO2: 95% 92%  94%  Weight:   59.3 kg (130 lb 12.8 oz)     Gen:  NAD   The results of significant diagnostics from this hospitalization (including imaging, microbiology, ancillary and laboratory) are listed below for reference.     Procedures and Diagnostic Studies:   No results found.   Labs:   Basic Metabolic Panel:  Recent Labs Lab 01/25/17 0949 01/25/17 1311  01/26/17 0233 01/27/17 0404  NA 138  --   --  139 137  K 2.3*  --   < > 4.6 4.2  CL 97*  --   --  110 108  CO2 27  --   --  23 23  GLUCOSE 119*  --   --  135* 107*  BUN 25*  --   --  13 14  CREATININE 0.95  --   --  0.62 0.49*  CALCIUM 9.8  --   --  8.6* 8.6*  MG  --  1.9  --  3.0*  --   < > = values in this interval not displayed. GFR Estimated Creatinine Clearance: 64.9 mL/min (A) (by C-G formula based on SCr of 0.49 mg/dL (L)). Liver Function Tests:  Recent Labs Lab 01/25/17 0949 01/26/17 0233  AST 76* 44*  ALT 74* 62  ALKPHOS 82 53  BILITOT 1.4* 1.1  PROT 6.2* 5.1*  ALBUMIN 3.2* 2.6*    Recent Labs Lab 01/25/17 0949  LIPASE 27   No results for input(s): AMMONIA in the last 168 hours. Coagulation profile No results for input(s): INR, PROTIME in the last 168 hours.  CBC:  Recent Labs Lab 01/25/17 0949 01/26/17 0233 01/27/17 0404  WBC 14.0* 20.5* 14.9*  NEUTROABS 13.3*  --   --    HGB 16.4 13.6 12.3*  HCT 46.4 40.2 37.2*  MCV 94.3 94.4 95.1  PLT 176 150 127*   Cardiac Enzymes: No results for input(s): CKTOTAL, CKMB, CKMBINDEX, TROPONINI in the last 168 hours. BNP: Invalid input(s): POCBNP CBG: No results for input(s): GLUCAP in the last 168 hours. D-Dimer No results for input(s): DDIMER in the last 72 hours. Hgb A1c No results for input(s): HGBA1C in the last 72 hours. Lipid Profile No results for input(s): CHOL, HDL, LDLCALC, TRIG, CHOLHDL, LDLDIRECT in the last 72 hours. Thyroid function studies No results for input(s): TSH, T4TOTAL, T3FREE, THYROIDAB in the last 72 hours.  Invalid input(s): FREET3 Anemia work up No results for input(s): VITAMINB12, FOLATE, FERRITIN, TIBC, IRON, RETICCTPCT in the last 72 hours. Microbiology Recent Results (from the past 240 hour(s))  C difficile quick scan w PCR reflex     Status: Abnormal   Collection Time: 01/25/17 12:23 PM  Result Value Ref Range Status   C Diff antigen POSITIVE (A) NEGATIVE Final   C Diff toxin NEGATIVE NEGATIVE Final   C Diff interpretation Results are indeterminate. See PCR results.  Final  Gastrointestinal Panel by PCR , Stool     Status: Abnormal   Collection Time: 01/25/17 12:23 PM  Result Value Ref Range Status   Campylobacter species NOT DETECTED NOT DETECTED Final   Plesimonas shigelloides NOT DETECTED NOT DETECTED Final   Salmonella species NOT DETECTED NOT DETECTED Final   Yersinia enterocolitica NOT DETECTED NOT DETECTED Final   Vibrio species NOT DETECTED NOT DETECTED Final   Vibrio cholerae NOT DETECTED NOT DETECTED Final   Enteroaggregative E coli (EAEC) NOT DETECTED NOT DETECTED Final   Enteropathogenic E coli (EPEC) DETECTED (A) NOT DETECTED Corrected    Comment: RESULT CALLED TO, READ BACK BY AND VERIFIED WITH: CARRIE BAKER AT 1534 ON 01/26/17 BY [facility] CORRECTED ON 08/21 AT 1545: PREVIOUSLY REPORTED AS DETECTED RESULT CALLED TO, READ BACK BY AND VERIFIED WITH: ROBIN YOW AT 1534  ON 01/26/17 BY [facility]    Enterotoxigenic E coli (ETEC) NOT DETECTED NOT DETECTED Final   Shiga like toxin producing E coli (STEC) NOT DETECTED NOT DETECTED Final   Shigella/Enteroinvasive E coli (EIEC) NOT DETECTED NOT DETECTED Final   Cryptosporidium NOT DETECTED NOT DETECTED Final   Cyclospora cayetanensis NOT DETECTED NOT DETECTED Final   Entamoeba histolytica NOT DETECTED NOT DETECTED Final   Giardia lamblia NOT DETECTED NOT DETECTED Final   Adenovirus F40/41 NOT DETECTED NOT DETECTED Final   Astrovirus NOT DETECTED NOT DETECTED Final   Norovirus GI/GII NOT DETECTED NOT DETECTED Final   Rotavirus A NOT DETECTED  NOT DETECTED Final   Sapovirus (I, II, IV, and V) NOT DETECTED NOT DETECTED Final  Clostridium Difficile by PCR     Status: None   Collection Time: 01/25/17 12:23 PM  Result Value Ref Range Status   Toxigenic C Difficile by pcr NEGATIVE NEGATIVE Final    Comment: Patient is colonized with non toxigenic C. difficile. May not need treatment unless significant symptoms are present.     Discharge Instructions:   Discharge Instructions    Diet - low sodium heart healthy    Complete by:  As directed    Discharge instructions    Complete by:  As directed    Home health PT Symptomatic treatment of your loose stools Start sinemet as per Dr. Posey Pronto   Increase activity slowly    Complete by:  As directed      Allergies as of 01/27/2017      Reactions   Pseudoephedrine Palpitations, Other (See Comments)   "Heart races"   Tape Itching, Other (See Comments)   tears skin, can use paper tape   Sulfa Antibiotics Itching   Sulfacetamide Sodium Itching   Lodine [etodolac] Diarrhea   Oxycodone Other (See Comments)   "over sedates"      Medication List    STOP taking these medications   chlorthalidone 25 MG tablet Commonly known as:  HYGROTON   metoprolol succinate 25 MG 24 hr tablet Commonly known as:  TOPROL-XL     TAKE these medications   albuterol 108 (90 Base) MCG/ACT  inhaler Commonly known as:  PROVENTIL HFA;VENTOLIN HFA Inhale 2 puffs into the lungs every 6 (six) hours as needed for wheezing or shortness of breath.   aspirin-acetaminophen-caffeine 250-250-65 MG tablet Commonly known as:  EXCEDRIN MIGRAINE Take 1-2 tablets by mouth 2 (two) times daily as needed for headache (pain).   azaTHIOprine 50 MG tablet Commonly known as:  IMURAN Take 1 tablet daily for a 2 weeks, then increase to 2 tablet daily. What changed:  how much to take  how to take this  when to take this  additional instructions   CALTRATE 600+D PLUS MINERALS 600-800 MG-UNIT Tabs Take 1 tablet by mouth 2 (two) times daily.   carbidopa-levodopa 25-100 MG tablet Commonly known as:  SINEMET IR Take 1 tablet by mouth 3 (three) times daily. What changed:  how much to take  when to take this  additional instructions   cholecalciferol 1000 units tablet Commonly known as:  VITAMIN D Take 1,000 Units by mouth daily.   dicyclomine 10 MG capsule Commonly known as:  BENTYL TAKE 1 CAPSULE BY MOUTH THREE TIMES DAILY BEFORE MEALS What changed:  how much to take  how to take this  when to take this  additional instructions   diltiazem 360 MG 24 hr capsule Commonly known as:  TIAZAC Take 1 capsule (360 mg total) by mouth daily.   HYDROcodone-acetaminophen 5-325 MG tablet Commonly known as:  NORCO/VICODIN Take 1 tablet by mouth every 4 (four) hours as needed (pain).   multivitamin with minerals Tabs tablet Take 1 tablet by mouth daily.   omeprazole 20 MG capsule Commonly known as:  PRILOSEC Take 1 capsule (20 mg total) by mouth 2 (two) times daily.   oxymetazoline 0.05 % nasal spray Commonly known as:  AFRIN Place 1 spray into both nostrils 2 (two) times daily as needed for congestion.   predniSONE 20 MG tablet Commonly known as:  DELTASONE Take 1 tablet (20 mg total) by mouth daily with  breakfast.   saccharomyces boulardii 250 MG capsule Commonly known  as:  FLORASTOR Take 1 capsule (250 mg total) by mouth 2 (two) times daily.            Durable Medical Equipment        Start     Ordered   01/27/17 1336  For home use only DME lightweight manual wheelchair with seat cushion  Once    Comments:  Patient suffers from [place] which impairs their ability to perform daily activities like ambulating in the home.  A cane will not resolve  issue with performing activities of daily living. A wheelchair will allow patient to safely perform daily activities. Patient is not able to propel themselves in the home using a standard weight wheelchair due to ADENOCARCINOMA, PROSTATE Patient can self propel in the lightweight wheelchair.  Accessories: elevating leg rests (ELRs), wheel locks, extensions and anti-tippers.   01/27/17 1341       Discharge Care Instructions        Start     Ordered   01/27/17 0000  predniSONE (DELTASONE) 20 MG tablet  Daily with breakfast     01/27/17 1505   01/27/17 0000  Increase activity slowly     01/27/17 1505   01/27/17 0000  Diet - low sodium heart healthy     01/27/17 1505   01/27/17 0000  Discharge instructions    Comments:  Home health PT Symptomatic treatment of your loose stools Start sinemet as per Dr. Posey Pronto   01/27/17 1505     Follow-up Information    Janith Lima, MD Follow up in 1 week(s).   Specialty:  Internal Medicine Contact information: [address] [address] [phone]            Time coordinating discharge: 25 min  Signed:  Rashawnda Gaba Alison Stalling   [facility] 01/27/2017, 3:05 PM

## 2017-01-27 NOTE — Consult Note (Signed)
Neurology Consultation Reason for Consult: concern for myasthenia flareup Referring Physician: [facility]   CC: weakness History is obtained from:Patient and his wife  HPI: [name] is a 76 y.o. male with history of seropositive myasthenia gravis, prior CDif infection, nonobstructive CAD, history of duodenal ulcer, history of prostate cancer, IBS, HTN, who presented on 8/20 with 3 day history of intermittent diarrhea without abd pain- GI PCR found to have EP EColi.  CDif PCR negative  Per chart review, pt follows Dr Posey Pronto in Neurology.  In mid-dec 2017, pt woke up with right eye closure, and was diagnosed with right ptosis , and later developed monocular double vision. Myasthenia antibodies were checked which were positive. He was started on mestinon which he was not able to tolerate due to Gi side effects.  Pt has 5 year history of bilateral leg fatigue and also numbness of the feet.  In May 29, pt presented again because he was unable to tolerate prednisone and it made him tired. Imuran was started then.  In July, pt had 1 week history of dysphagia and generalized weakness, tiredness. He lost 10 pounds over month.  Pt last saw neurology on August 10, and he has been on prednisone 40 mg daily with plan to taper it to 20 mg daily- he did not notice any new double vision, worsening droopy eye lid or weakness. His main complaint at that time was difficulty with walking and shuffling steps. Has chronic IBS and constipation. It was noted that pt had small fenestrating steps and bradykinesia with finger tapping bilaterally. Sinemet was also started.  On exam, Pt is accompanied by his wife who are very frustrated with the lack of information. Pt says that he is not able to tolerate prednisone at higher doses and is wondering why he is receiving prednisone 40 mg daily. Says that at dose of 30 mg, he was feeling the sensation of choking, so when the dose was decreased to 20 mg daily, he was able to tolerate  it in the past. They are also concerned about the Imuran.  Pt says that he was having some diarrhea which brought him to the hospital, and he was also feeling very weak.  Pt denies any nausea, vomiting, dysphagia, muscle weakness, difficulty with closing the eye. Says his main complaint is the weakness of the legs which seem like they will give out and he has knee pain.  They say that prior to prednisone and Imuran, pt was having a part time job, and since these medications, he has not been able to do that.    Neurology is consulted for concern of myasthenia flare up, and recommendations   LKW:  3 days ago tpa given?: no,     ROS: A 14 point ROS was performed and is negative except as noted in the HPI.   Past Medical History:  Diagnosis Date  . Adenoma of left adrenal gland    stable per ct  . Coronary artery disease CARDIOLOGIST-- DR Rayann Heman   NON-OBSTRUCTIVE CAD--- 2005 Cath with 30% LAD, calcification/plaque of LAD, EF 60%  . DDD (degenerative disc disease), lumbosacral   . Depression   . Diverticulosis, sigmoid   . Eczema   . ED (erectile dysfunction)   . Encopresis   . GERD (gastroesophageal reflux disease)   . Hemorrhoids 2002  . History of duodenal ulcer   . History of esophageal dilatation    FOR STRICTURE 2008  . History of gallstones   . History of  kidney stones 2014   right ureteral lithotripsy 01/2013  . History of peptic ulcer   . History of prostate cancer    S/P RADICAL PROSTATECTOMY 2009  . Hyperlipidemia   . Hypertension   . IBS (irritable bowel syndrome)   . Ischemic colitis ([place])   . OA (osteoarthritis)    RIGHT SHOULDER/ HIPS  . PAC (premature atrial contraction)   . Peyronie's disease   . Right ureteral stone   . Rotator cuff tear arthropathy of both shoulders      Family History  Problem Relation Age of Onset  . Heart attack Father 68  . Aneurysm Father   . Bone cancer Maternal Grandfather   . Colon cancer Maternal Aunt 68  . Lymphoma  Maternal Aunt      Social History:  reports that he has been smoking Cigarettes.  He has a 53.00 pack-year smoking history. He has never used smokeless tobacco. He reports that he does not drink alcohol or use drugs.   Exam: Current vital signs: BP 137/77 (BP Location: Right Arm)   Pulse 78   Temp 98.3 F (36.8 C) (Oral)   Resp 17   Wt 130 lb 12.8 oz (59.3 kg)   SpO2 92%   BMI 21.11 kg/m  Vital signs in last 24 hours: Temp:  [98.2 F (36.8 C)-98.3 F (36.8 C)] 98.3 F (36.8 C) (08/22 0552) Pulse Rate:  [63-78] 78 (08/22 0552) Resp:  [17-19] 17 (08/22 0552) BP: (114-137)/(64-77) 137/77 (08/22 0552) SpO2:  [92 %-95 %] 92 % (08/22 0552) Weight:  [130 lb 12.8 oz (59.3 kg)] 130 lb 12.8 oz (59.3 kg) (08/22 0615)   Physical Exam  Constitutional: Appears thin, wife at bedside   Psych: Affect appropriate to situation Eyes: No scleral injection HENT: No OP obstrucion Head: Normocephalic.  Cardiovascular: Normal rate and regular rhythm.  Respiratory: Effort normal and breath sounds normal to anterior ascultation GI: Soft.  No distension. There is no tenderness.  Skin: WDI  Neuro: Mental Status: Patient is awake, alert, oriented to person, place, month, year, and situation. Patient is able to give a clear and coherent history. No signs of aphasia or neglect. Speech is fluent and no dysarthria  Cranial Nerves: II: Visual Fields are full. Pupils are equal, round, and reactive to light.   III,IV, VI: EOMI without ptosis or diplopia.  V: Facial sensation is symmetric to temperature VII: Facial movement is symmetric.  VIII: hearing is intact to voice X: Uvula elevates symmetrically XI: Shoulder shrug is symmetric. XII: tongue is midline without atrophy or fasciculations.  Motor: Tone is normal. Bulk is normal. 5/5 strength was present in all four extremities.  Sensory: Sensation is symmetric to light touch and temperature in the arms and legs. Deep Tendon Reflexes: 2+ and  symmetric in the biceps and patellae.  Cerebellar: FNF slow on the left hand. Rapid alternating movement slow on the right hand Gait not tested.   I have reviewed labs in epic and the results pertinent to this consultation are:  Labs 06/11/2016: AChR binding 11.10*, blocking 48, modulating 91  Labs TSH 1.26, vitamin B12 649, folate >23.7 Labs 10/09/2016:  Copper 96, SPEP with IFE no M protein, TMPT normal  CT chest 10/19/2016:  No thymoma  CBC 8/22 WC 14.9, hgB 12.3 , Platelets 127  I have reviewed the images obtained:  Impression:    Seropositive myasthenia gravis initially manifesting with right ptosis, diplopia, and fatigue diagnosed in December 2017:  Patient presents to the hospital  with weakness and diarrhea and found to have Ecoli in stool and was hypokalemic. His gastroenteriris has exacerbated his weakness, and I do not believe patient is in myasthenia crisis or that he has a flareup. His neurologic exam is stable. He does have resting tremor on the right hand , and some slowness in finger tapping. His strength and sensation is intact throughout.  He and his wife do have a lot of concerns about the medications and are concerned about the higher dose of prednisone. Pt says he is able to tolerate the Imuran.Metoprolol is listed as his home medication- I verified with the patient that he is not taking the metoprolol at home. He does have nonobstructive CAD but no history of Afib- beta blockers can exacerbate the weakness in myasthenia so recommend avoiding metoprolol   Recommendations: -continue Imuran daily -continue prednisone 20 mg daily- since pt was taking this prior to being hospitalized  -follow up with outpatient Neurologist Dr patel  -Outpatient PT  Shuffling gait and bradykinesia consistent with parkinsonism: Pt is able to tolerate the Sinemet  -continue Sinemet as advised by Dr. Posey Pronto (to start in Sept).   Signed Burgess Estelle MD IM Resident Neurology  Team   Attending addendum Patient seen and examined. Next on patient reports to be at neurological baseline. From a neurological standpoint, he follows with Dr. Posey Pronto at [facility] neurology. At this point, I would not recommend any changes to his outpatient medications for mastery. Medical management per primary team. Outpatient follow-up with Dr. Posey Pronto on discharge. Please call us with questions.  Amie Portland, MD [facility] [phone]  If 7pm to 7am, please call on call as listed on AMION.

## 2017-01-27 NOTE — Progress Notes (Signed)
Pt refused NIF and VC this AM due to pain.

## 2017-01-27 NOTE — Progress Notes (Signed)
PT Cancellation Note  Patient Details Name: [name] MRN: [id number] DOB: 1940-11-02   Cancelled Treatment:    Reason Eval/Treat Not Completed: Other (comment); deferred tx due to pt for d/c today.  Agree with HHPT/aide as arranged by RN case manager.   Reginia Naas 01/27/2017, 3:57 PM  Magda Kiel, [place] 01/27/2017

## 2017-01-29 LAB — CUP PACEART INCLINIC DEVICE CHECK
MDC IDC PG IMPLANT DT: 20170807
MDC IDC SESS DTM: 20180824135601

## 2017-02-01 DIAGNOSIS — G7000 Myasthenia gravis without (acute) exacerbation: Secondary | ICD-10-CM | POA: Diagnosis not present

## 2017-02-01 DIAGNOSIS — Z8619 Personal history of other infectious and parasitic diseases: Secondary | ICD-10-CM | POA: Diagnosis not present

## 2017-02-01 DIAGNOSIS — K5730 Diverticulosis of large intestine without perforation or abscess without bleeding: Secondary | ICD-10-CM | POA: Diagnosis not present

## 2017-02-01 DIAGNOSIS — I2510 Atherosclerotic heart disease of native coronary artery without angina pectoris: Secondary | ICD-10-CM | POA: Diagnosis not present

## 2017-02-01 DIAGNOSIS — Z8546 Personal history of malignant neoplasm of prostate: Secondary | ICD-10-CM | POA: Diagnosis not present

## 2017-02-01 DIAGNOSIS — Z8673 Personal history of transient ischemic attack (TIA), and cerebral infarction without residual deficits: Secondary | ICD-10-CM | POA: Diagnosis not present

## 2017-02-01 DIAGNOSIS — M5137 Other intervertebral disc degeneration, lumbosacral region: Secondary | ICD-10-CM | POA: Diagnosis not present

## 2017-02-01 DIAGNOSIS — K580 Irritable bowel syndrome with diarrhea: Secondary | ICD-10-CM | POA: Diagnosis not present

## 2017-02-01 DIAGNOSIS — I10 Essential (primary) hypertension: Secondary | ICD-10-CM | POA: Diagnosis not present

## 2017-02-02 ENCOUNTER — Telehealth: Payer: Self-pay | Admitting: Neurology

## 2017-02-02 NOTE — Telephone Encounter (Signed)
The only other option this week is Thursday 8/30 at 3pm.

## 2017-02-02 NOTE — Telephone Encounter (Signed)
Please advise 

## 2017-02-02 NOTE — Telephone Encounter (Signed)
PT's daughter Janace Hoard called and said PT needed a follow up with Dr Posey Pronto from a hospital stay, she said he is not doing well so I gave her an appointment for tomorrow  Morning and she said she can not get him here that early and she said it's urgent he get in, she wants a later time appointment but Dr Posey Pronto does not have any, please advise CB# [phone]

## 2017-02-02 NOTE — Telephone Encounter (Signed)
Hinton Dyer called patient and she agreed to take the Thursday afternoon appointment.

## 2017-02-03 ENCOUNTER — Ambulatory Visit: Payer: Medicare HMO | Admitting: Neurology

## 2017-02-03 ENCOUNTER — Inpatient Hospital Stay: Payer: Medicare HMO | Admitting: Internal Medicine

## 2017-02-04 ENCOUNTER — Ambulatory Visit: Payer: Medicare HMO | Admitting: Neurology

## 2017-02-04 ENCOUNTER — Inpatient Hospital Stay: Payer: Medicare HMO | Admitting: Internal Medicine

## 2017-02-04 DIAGNOSIS — Z029 Encounter for administrative examinations, unspecified: Secondary | ICD-10-CM

## 2017-02-10 ENCOUNTER — Other Ambulatory Visit: Payer: Self-pay | Admitting: Internal Medicine

## 2017-02-10 ENCOUNTER — Encounter: Payer: Self-pay | Admitting: Neurology

## 2017-02-10 DIAGNOSIS — K559 Vascular disorder of intestine, unspecified: Secondary | ICD-10-CM

## 2017-02-11 ENCOUNTER — Other Ambulatory Visit (INDEPENDENT_AMBULATORY_CARE_PROVIDER_SITE_OTHER): Payer: Medicare HMO

## 2017-02-11 ENCOUNTER — Encounter: Payer: Self-pay | Admitting: Internal Medicine

## 2017-02-11 ENCOUNTER — Ambulatory Visit (INDEPENDENT_AMBULATORY_CARE_PROVIDER_SITE_OTHER): Payer: Medicare HMO | Admitting: Internal Medicine

## 2017-02-11 VITALS — BP 112/60 | HR 107 | Temp 98.7°F | Ht 66.0 in | Wt 119.0 lb

## 2017-02-11 DIAGNOSIS — E611 Iron deficiency: Secondary | ICD-10-CM | POA: Diagnosis not present

## 2017-02-11 DIAGNOSIS — I10 Essential (primary) hypertension: Secondary | ICD-10-CM

## 2017-02-11 DIAGNOSIS — R946 Abnormal results of thyroid function studies: Secondary | ICD-10-CM

## 2017-02-11 DIAGNOSIS — R7989 Other specified abnormal findings of blood chemistry: Secondary | ICD-10-CM

## 2017-02-11 DIAGNOSIS — Z23 Encounter for immunization: Secondary | ICD-10-CM

## 2017-02-11 DIAGNOSIS — D72829 Elevated white blood cell count, unspecified: Secondary | ICD-10-CM

## 2017-02-11 DIAGNOSIS — G7000 Myasthenia gravis without (acute) exacerbation: Secondary | ICD-10-CM

## 2017-02-11 DIAGNOSIS — E876 Hypokalemia: Secondary | ICD-10-CM

## 2017-02-11 DIAGNOSIS — D539 Nutritional anemia, unspecified: Secondary | ICD-10-CM

## 2017-02-11 DIAGNOSIS — F0390 Unspecified dementia without behavioral disturbance: Secondary | ICD-10-CM | POA: Insufficient documentation

## 2017-02-11 LAB — CBC WITH DIFFERENTIAL/PLATELET
BASOS PCT: 0.3 % (ref 0.0–3.0)
Basophils Absolute: 0.1 10*3/uL (ref 0.0–0.1)
EOS ABS: 0 10*3/uL (ref 0.0–0.7)
Eosinophils Relative: 0.1 % (ref 0.0–5.0)
HCT: 42.3 % (ref 39.0–52.0)
HEMOGLOBIN: 14.4 g/dL (ref 13.0–17.0)
Lymphocytes Relative: 1.6 % — ABNORMAL LOW (ref 12.0–46.0)
Lymphs Abs: 0.5 10*3/uL — ABNORMAL LOW (ref 0.7–4.0)
MCHC: 34.1 g/dL (ref 30.0–36.0)
MCV: 97.5 fl (ref 78.0–100.0)
MONO ABS: 0.5 10*3/uL (ref 0.1–1.0)
Monocytes Relative: 1.8 % — ABNORMAL LOW (ref 3.0–12.0)
Neutro Abs: 27.8 10*3/uL — ABNORMAL HIGH (ref 1.4–7.7)
Neutrophils Relative %: 96.2 % — ABNORMAL HIGH (ref 43.0–77.0)
Platelets: 498 10*3/uL — ABNORMAL HIGH (ref 150.0–400.0)
RBC: 4.34 Mil/uL (ref 4.22–5.81)
RDW: 15.9 % — AB (ref 11.5–15.5)

## 2017-02-11 LAB — BASIC METABOLIC PANEL
BUN: 19 mg/dL (ref 6–23)
CO2: 29 mEq/L (ref 19–32)
Calcium: 9.1 mg/dL (ref 8.4–10.5)
Chloride: 94 mEq/L — ABNORMAL LOW (ref 96–112)
Creatinine, Ser: 0.68 mg/dL (ref 0.40–1.50)
GFR: 120.09 mL/min (ref >60.00)
GLUCOSE: 121 mg/dL — AB (ref 70–99)
POTASSIUM: 3.3 meq/L — AB (ref 3.5–5.1)
SODIUM: 134 meq/L — AB (ref 135–145)

## 2017-02-11 LAB — THYROID PANEL WITH TSH
Free Thyroxine Index: 2.2 (ref 1.4–3.8)
T3 UPTAKE: 36 % — AB (ref 22–35)
T4, Total: 6.2 ug/dL (ref 4.9–10.5)
TSH: 0.4 mIU/L (ref 0.40–4.50)

## 2017-02-11 LAB — FOLATE

## 2017-02-11 LAB — IBC PANEL
Iron: 28 ug/dL — ABNORMAL LOW (ref 42–165)
SATURATION RATIOS: 11.8 % — AB (ref 20.0–50.0)
TRANSFERRIN: 170 mg/dL — AB (ref 212.0–360.0)

## 2017-02-11 LAB — VITAMIN B12: Vitamin B-12: >1500 pg/mL — ABNORMAL HIGH (ref 211–911)

## 2017-02-11 LAB — FERRITIN: Ferritin: 351.9 ng/mL — ABNORMAL HIGH (ref 22.0–322.0)

## 2017-02-11 MED ORDER — POTASSIUM CHLORIDE CRYS ER 10 MEQ PO TBCR: 10.0000 meq | EXTENDED_RELEASE_TABLET | Freq: Two times a day (BID) | ORAL | 1 refills | Status: AC

## 2017-02-11 MED ORDER — AZATHIOPRINE 50 MG PO TABS: 100.0000 mg | ORAL_TABLET | Freq: Every day | ORAL | 0 refills | Status: AC

## 2017-02-11 MED ORDER — FERROUS SULFATE 220 (44 FE) MG/5ML PO ELIX: 220.0000 mg | ORAL_SOLUTION | Freq: Every day | ORAL | 3 refills | Status: AC

## 2017-02-11 NOTE — Patient Instructions (Signed)
Anemia, Nonspecific Anemia is a condition in which the concentration of red blood cells or hemoglobin in the blood is below normal. Hemoglobin is a substance in red blood cells that carries oxygen to the tissues of the body. Anemia results in not enough oxygen reaching these tissues. What are the causes? Common causes of anemia include:  Excessive bleeding. Bleeding may be internal or external. This includes excessive bleeding from periods (in women) or from the intestine.  Poor nutrition.  Chronic kidney, thyroid, and liver disease.  Bone marrow disorders that decrease red blood cell production.  Cancer and treatments for cancer.  HIV, AIDS, and their treatments.  Spleen problems that increase red blood cell destruction.  Blood disorders.  Excess destruction of red blood cells due to infection, medicines, and autoimmune disorders. What are the signs or symptoms?  Minor weakness.  Dizziness.  Headache.  Palpitations.  Shortness of breath, especially with exercise.  Paleness.  Cold sensitivity.  Indigestion.  Nausea.  Difficulty sleeping.  Difficulty concentrating. Symptoms may occur suddenly or they may develop slowly. How is this diagnosed? Additional blood tests are often needed. These help your health care provider determine the best treatment. Your health care provider will check your stool for blood and look for other causes of blood loss. How is this treated? Treatment varies depending on the cause of the anemia. Treatment can include:  Supplements of iron, vitamin B12, or folic acid.  Hormone medicines.  A blood transfusion. This may be needed if blood loss is severe.  Hospitalization. This may be needed if there is significant continual blood loss.  Dietary changes.  Spleen removal. Follow these instructions at home: Keep all follow-up appointments. It often takes many weeks to correct anemia, and having your health care provider check on your  condition and your response to treatment is very important. Get help right away if:  You develop extreme weakness, shortness of breath, or chest pain.  You become dizzy or have trouble concentrating.  You develop heavy vaginal bleeding.  You develop a rash.  You have bloody or black, tarry stools.  You faint.  You vomit up blood.  You vomit repeatedly.  You have abdominal pain.  You have a fever or persistent symptoms for more than 2-3 days.  You have a fever and your symptoms suddenly get worse.  You are dehydrated. This information is not intended to replace advice given to you by your health care provider. Make sure you discuss any questions you have with your health care provider. Document Released: 07/02/2004 Document Revised: 11/06/2015 Document Reviewed: 11/18/2012 Elsevier Interactive Patient Education  2017 Elsevier Inc.  

## 2017-02-11 NOTE — Progress Notes (Signed)
Subjective:  Patient ID: [name], male    DOB: 03/30/1941  Age: 76 y.o. MRN: [id number]  CC: Anemia and Hypertension   HPI [name] presents for f/up after a recent admission for Escherichia coli enteritis that appeared to be complicated by a myasthenia gravis crisis. His daughter is with him today and she tells me that she is exasperated. She needs help at home taking care of him. He has become wheelchair bound and has had a declining memory and worsening forgetfulness. He has also developed a shuffling gait and is apparently being treated for parkinsonism. His neurologist and I have been concerned about his elevated white cell count. At 3 different times I have ordered a flow cytometry and none of the times has the result been completed. The lab did not notify me that the request for flow cytometry was not completed. His daughter complains that the current dose of Imuran is not helping and she thinks it's causing some symptoms. She wants permission to cut the dose in half.  Outpatient Medications Prior to Visit  Medication Sig Dispense Refill  . Calcium Carbonate-Vit D-Min (CALTRATE 600+D PLUS MINERALS) 600-800 MG-UNIT TABS Take 1 tablet by mouth 2 (two) times daily.    . carbidopa-levodopa (SINEMET IR) 25-100 MG tablet Take 1 tablet by mouth 3 (three) times daily. (Patient taking differently: Take 0.5-1 tablets by mouth See admin instructions. To start 02/06/17: Week 1 - take 1/2 tablet by mouth three times daily - 30 minutes before meals                                                     Week 2 - take 1/2 tablet before breakfast and lunch, take 1 tablet before supper                                                     Week 3 - take 1/2 tablet before breakfast and 1 tablet before lunch and supper                                                      Week 4 - take 1 tablet before breakfast, lunch and supper) 90 tablet 3  . cholecalciferol (VITAMIN D) 1000 units tablet Take 1,000 Units by  mouth daily.    Marland Kitchen dicyclomine (BENTYL) 10 MG capsule TAKE 1 CAPSULE THREE TIMES DAILY BEFORE MEALS 270 capsule 1  . diltiazem (TIAZAC) 360 MG 24 hr capsule Take 1 capsule (360 mg total) by mouth daily. 30 capsule 0  . Multiple Vitamin (MULTIVITAMIN WITH MINERALS) TABS tablet Take 1 tablet by mouth daily.    Marland Kitchen omeprazole (PRILOSEC) 20 MG capsule Take 1 capsule (20 mg total) by mouth 2 (two) times daily. 180 capsule 3  . predniSONE (DELTASONE) 20 MG tablet Take 1 tablet (20 mg total) by mouth daily with breakfast.    . saccharomyces boulardii (FLORASTOR) 250 MG capsule Take 1 capsule (250 mg total) by mouth 2 (two) times daily. (Patient taking differently: Take  250 mg by mouth 2 (two) times daily. Patient is using OTC Ultimate Flora with 30 billion culture units.) 60 capsule 1  . azaTHIOprine (IMURAN) 50 MG tablet Take 1 tablet daily for a 2 weeks, then increase to 2 tablet daily. (Patient taking differently: Take 100 mg by mouth daily. ) 60 tablet 3  . albuterol (PROVENTIL HFA;VENTOLIN HFA) 108 (90 Base) MCG/ACT inhaler Inhale 2 puffs into the lungs every 6 (six) hours as needed for wheezing or shortness of breath.    Marland Kitchen HYDROcodone-acetaminophen (NORCO/VICODIN) 5-325 MG tablet Take 1 tablet by mouth every 4 (four) hours as needed (pain).    Marland Kitchen aspirin-acetaminophen-caffeine (EXCEDRIN MIGRAINE) 250-250-65 MG tablet Take 1-2 tablets by mouth 2 (two) times daily as needed for headache (pain).    Marland Kitchen oxymetazoline (AFRIN) 0.05 % nasal spray Place 1 spray into both nostrils 2 (two) times daily as needed for congestion.     No facility-administered medications prior to visit.     ROS Review of Systems  Constitutional: Positive for activity change, appetite change, fatigue and unexpected weight change. Negative for chills, diaphoresis and fever.  HENT: Positive for trouble swallowing. Negative for sinus pressure.   Eyes: Positive for visual disturbance (DV).  Respiratory: Negative.  Negative for cough,  chest tightness, shortness of breath and wheezing.   Cardiovascular: Negative for chest pain, palpitations and leg swelling.  Gastrointestinal: Negative for abdominal pain, constipation, diarrhea, nausea and vomiting.  Endocrine: Negative.   Genitourinary: Negative.  Negative for decreased urine volume, difficulty urinating, dysuria and urgency.  Musculoskeletal: Positive for gait problem. Negative for back pain, myalgias and neck pain.  Skin: Negative.  Negative for color change and rash.  Allergic/Immunologic: Negative.   Neurological: Positive for weakness. Negative for dizziness, facial asymmetry and numbness.  Hematological: Negative for adenopathy. Does not bruise/bleed easily.  Psychiatric/Behavioral: Negative.     Objective:  BP 112/60 (BP Location: Left Arm, Patient Position: Sitting, Cuff Size: Normal)   Pulse (!) 107   Temp 98.7 F (37.1 C) (Oral)   Ht 5\' 6"  (1.676 m)   Wt 119 lb (54 kg)   SpO2 99%   BMI 19.21 kg/m   BP Readings from Last 3 Encounters:  02/11/17 112/60  01/27/17 130/70  01/21/17 (!) 142/50    Wt Readings from Last 3 Encounters:  02/11/17 119 lb (54 kg)  01/27/17 130 lb 12.8 oz (59.3 kg)  01/21/17 121 lb (54.9 kg)    Physical Exam  Constitutional:  Non-toxic appearance. He has a sickly appearance. He appears ill. No distress.  Thin, frail In a wheelchair  HENT:  Mouth/Throat: No oropharyngeal exudate.  Eyes: Conjunctivae are normal. Right eye exhibits no discharge. Left eye exhibits no discharge. No scleral icterus.  Neck: Normal range of motion. Neck supple. No JVD present. No thyromegaly present.  Cardiovascular: Normal rate, regular rhythm and normal heart sounds.  Exam reveals no gallop and no friction rub.   No murmur heard. Pulmonary/Chest: Effort normal and breath sounds normal. No respiratory distress. He has no wheezes. He has no rales. He exhibits no tenderness.  Abdominal: Soft. Bowel sounds are normal. He exhibits no distension and  no mass. There is no tenderness. There is no rebound and no guarding.  Musculoskeletal: Normal range of motion. He exhibits no edema, tenderness or deformity.  Lymphadenopathy:    He has no cervical adenopathy.  Neurological: He is alert. He displays atrophy. He displays no tremor. No cranial nerve deficit or sensory deficit. He exhibits abnormal muscle  tone. He displays a negative Romberg sign. He displays no seizure activity. Coordination and gait abnormal.  Skin: Skin is warm and dry. No rash noted. He is not diaphoretic. No erythema. No pallor.  Psychiatric: Judgment normal. His mood appears anxious. His affect is not angry. His speech is delayed and tangential. His speech is not slurred. He is slowed and withdrawn. Cognition and memory are impaired. He exhibits a depressed mood. He expresses no homicidal and no suicidal ideation. He expresses no suicidal plans and no homicidal plans. He is communicative. He exhibits abnormal recent memory and abnormal remote memory. He is inattentive.  Vitals reviewed.   Lab Results  Component Value Date   WBC 28.9 cH (HH) 02/11/2017   HGB 14.4 02/11/2017   HCT 42.3 02/11/2017   PLT 498.0 (H) 02/11/2017   GLUCOSE 121 (H) 02/11/2017   CHOL 214 (H) 12/23/2016   TRIG 200.0 (H) 12/23/2016   HDL 62.00 12/23/2016   LDLDIRECT 134.5 08/22/2007   LDLCALC 112 (H) 12/23/2016   ALT 62 01/26/2017   AST 44 (H) 01/26/2017   NA 134 (L) 02/11/2017   K 3.3 (L) 02/11/2017   CL 94 (L) 02/11/2017   CREATININE 0.68 02/11/2017   BUN 19 02/11/2017   CO2 29 02/11/2017   TSH 0.40 02/11/2017   PSA 0.00 Repeated and verified X2. (L) 12/23/2016   INR 1.01 06/05/2016    No results found.  Assessment & Plan:   [name] was seen today for anemia and hypertension.  Diagnoses and all orders for this visit:  Deficiency anemia- his H&H have improved but his iron level is low. -     CBC with Differential/Platelet; Future -     IBC panel; Future -     Folate; Future -      Ferritin; Future -     Vitamin B12; Future  Essential hypertension- his blood pressure is well-controlled, lites and renal function are normal. -     Basic metabolic panel; Future  Low TSH level- his TFT's are normal -     Thyroid Panel With TSH; Future  Leukocytosis, unspecified type- I have ordered flow cytometry again. I've also to see hematology to be evaluated for lymphoproliferative disease. -     Flow Cytometry; Future -     Ambulatory referral to Hematology  Dementia arising in the senium and presenium- I think he and his daughter would benefit from help with either hospice or palliative care. -     Ambulatory referral to Hospice  Myasthenia gravis [facility]) -     Ambulatory referral to Hospice -     azaTHIOprine (IMURAN) 50 MG tablet; Take 2 tablets (100 mg total) by mouth daily.  Need for influenza vaccination -     Flu vaccine HIGH DOSE PF (Fluzone High dose)  Hypokalemia- his potassium level is down to 3.3. Will start potassium replacement therapy. -     potassium chloride (KLOR-CON M10) 10 MEQ tablet; Take 1 tablet (10 mEq total) by mouth 2 (two) times daily.  Iron deficiency- will start iron replacement therapy. -     ferrous sulfate 220 (44 Fe) MG/5ML solution; Take 5 mLs (220 mg total) by mouth daily.   I have discontinued [name] aspirin-acetaminophen-caffeine, oxymetazoline, and Potassium Bicarbonate. I have also changed his azaTHIOprine. Additionally, I am having him start on potassium chloride and ferrous sulfate. Lastly, I am having him maintain his omeprazole, saccharomyces boulardii, diltiazem, carbidopa-levodopa, albuterol, HYDROcodone-acetaminophen, cholecalciferol, CALTRATE 600+D PLUS MINERALS, multivitamin with minerals, predniSONE,  and dicyclomine.  Meds ordered this encounter  Medications  . DISCONTD: Potassium Bicarbonate 99 MG CAPS    Sig: Take by mouth.  . azaTHIOprine (IMURAN) 50 MG tablet    Sig: Take 2 tablets (100 mg total) by mouth daily.     Dispense:  90 tablet    Refill:  0  . potassium chloride (KLOR-CON M10) 10 MEQ tablet    Sig: Take 1 tablet (10 mEq total) by mouth 2 (two) times daily.    Dispense:  180 tablet    Refill:  1  . ferrous sulfate 220 (44 Fe) MG/5ML solution    Sig: Take 5 mLs (220 mg total) by mouth daily.    Dispense:  473 mL    Refill:  3     Follow-up: Return if symptoms worsen or fail to improve.  Scarlette Calico, MD

## 2017-02-15 ENCOUNTER — Telehealth: Payer: Self-pay | Admitting: Internal Medicine

## 2017-02-15 NOTE — Telephone Encounter (Signed)
Patient's wife called requesting for Stefannie to call her Daughter    Burman Foster (Daughter)        [phone]      They have some questions about the lab results that was given to them on 9/6. Please follow up with patient. Thank you.

## 2017-02-15 NOTE — Telephone Encounter (Signed)
lvm for Levada Dy to call back.

## 2017-02-16 NOTE — Telephone Encounter (Signed)
Pt's wife called to speak with you. She asked if you could call her or her daughter.

## 2017-02-16 NOTE — Telephone Encounter (Signed)
Spoke to pt dtr. Gave information about palliative care and oncology referral. Gave results from the thyroid test.

## 2017-02-23 ENCOUNTER — Encounter: Payer: Self-pay | Admitting: Hematology

## 2017-02-23 ENCOUNTER — Telehealth: Payer: Self-pay | Admitting: Hematology

## 2017-02-23 ENCOUNTER — Ambulatory Visit (HOSPITAL_BASED_OUTPATIENT_CLINIC_OR_DEPARTMENT_OTHER): Payer: Medicare HMO | Admitting: Hematology

## 2017-02-23 ENCOUNTER — Ambulatory Visit (HOSPITAL_BASED_OUTPATIENT_CLINIC_OR_DEPARTMENT_OTHER): Payer: Medicare HMO

## 2017-02-23 VITALS — BP 129/72 | HR 85 | Temp 97.9°F | Resp 18 | Ht 66.0 in | Wt 123.0 lb

## 2017-02-23 DIAGNOSIS — D72828 Other elevated white blood cell count: Secondary | ICD-10-CM

## 2017-02-23 DIAGNOSIS — D720 Genetic anomalies of leukocytes: Secondary | ICD-10-CM

## 2017-02-23 DIAGNOSIS — G7000 Myasthenia gravis without (acute) exacerbation: Secondary | ICD-10-CM | POA: Diagnosis not present

## 2017-02-23 DIAGNOSIS — R2231 Localized swelling, mass and lump, right upper limb: Secondary | ICD-10-CM | POA: Diagnosis not present

## 2017-02-23 DIAGNOSIS — I10 Essential (primary) hypertension: Secondary | ICD-10-CM | POA: Diagnosis not present

## 2017-02-23 LAB — CBC WITH DIFFERENTIAL/PLATELET
BASO%: 0.1 % (ref 0.0–2.0)
Basophils Absolute: 0 10*3/uL (ref 0.0–0.1)
EOS ABS: 0 10*3/uL (ref 0.0–0.5)
EOS%: 0.1 % (ref 0.0–7.0)
HEMATOCRIT: 41.7 % (ref 38.4–49.9)
HGB: 13.9 g/dL (ref 13.0–17.1)
LYMPH#: 0.7 10*3/uL — AB (ref 0.9–3.3)
LYMPH%: 4.3 % — AB (ref 14.0–49.0)
MCH: 33.3 pg (ref 27.2–33.4)
MCHC: 33.3 g/dL (ref 32.0–36.0)
MCV: 100 fL — AB (ref 79.3–98.0)
MONO#: 0.2 10*3/uL (ref 0.1–0.9)
MONO%: 1.4 % (ref 0.0–14.0)
NEUT%: 94.1 % — AB (ref 39.0–75.0)
NEUTROS ABS: 14.2 10*3/uL — AB (ref 1.5–6.5)
PLATELETS: 301 10*3/uL (ref 140–400)
RBC: 4.17 10*6/uL — AB (ref 4.20–5.82)
RDW: 15.4 % — ABNORMAL HIGH (ref 11.0–14.6)
WBC: 15 10*3/uL — ABNORMAL HIGH (ref 4.0–10.3)

## 2017-02-23 LAB — CHCC SMEAR

## 2017-02-23 NOTE — Progress Notes (Signed)
[place]  [phone] [phone]) [place] consult Note   Patient Care Team: Janith Lima, MD as PCP - General (Internal Medicine) Imagene Riches, MD (Internal Medicine) 02/23/2017  Referring physician: Janith Lima, MD  CHIEF COMPLAINTS/PURPOSE OF CONSULTATION:  Leukocytosis with predominant neutrophils  HISTORY OF PRESENTING ILLNESS:  [name] 76 y.o. male is here because of worsening leukocytosis. He was referred by his primary care physician Dr. Ronnald Ramp. He is accompanied by his daughter and grandson to my clinic today.  I have reviewed his lab, his WBC was normal until December 2017, when his CBC showed WBC 15K, with predominant neutrophils, normal absolute lymphocytes. The CBC was done when he presented to the ED with diarrhea. His subsequent W [facility] since May 2018 has been elevated, in the range of 14-28.9K, again with predominant neutrophils. No significant anemia, platelet count was slightly elevated at 498K 2 weeks ago. His ferritin has been elevated, vitamin W-40 and folic acid level has been normal, history of mild iron deficiency in the past, he has been taking oral iron for the past 2 weeks.  He has developed bilateral leg weakness for the past few years, he was noticed to have bilateral drooping eyes with double vision in May 2018, was seen by neurologist and was diagnosed with myasthenia gravis, started on prednisone in May 2018, in the dose range of 20-40 mg daily, he is currently on 20 mg daily. He was also started on Imuran in June 2018, his neurologist symptoms has not changed much since then. He is able to walk for short distance, which shuffled gait, although he does not use walker or cane, no recent fall. He spends most time in chair and bed lately.  His other comorbidities including hypertension, arthritis, multiple strokes in the past. He follows up with his primary care physician Dr. Ronnald Ramp closely, during his last visit with  Dr. Ronnald Ramp on 02/11/2017, hospice referral was made by Dr. Ronnald Ramp due to his overall poor performance status. Patient and his family has not signed up for hospice yet, he would like to have hematology workup for his leukocytosis first.   MEDICAL HISTORY:  Past Medical History:  Diagnosis Date  . Adenoma of left adrenal gland    stable per ct  . Coronary artery disease CARDIOLOGIST-- DR Rayann Heman   NON-OBSTRUCTIVE CAD--- 2005 Cath with 30% LAD, calcification/plaque of LAD, EF 60%  . DDD (degenerative disc disease), lumbosacral   . Depression   . Diverticulosis, sigmoid   . Eczema   . ED (erectile dysfunction)   . Encopresis   . GERD (gastroesophageal reflux disease)   . Hemorrhoids 2002  . History of duodenal ulcer   . History of esophageal dilatation    FOR STRICTURE 2008  . History of gallstones   . History of kidney stones 2014   right ureteral lithotripsy 01/2013  . History of peptic ulcer   . History of prostate cancer    S/P RADICAL PROSTATECTOMY 2009  . Hyperlipidemia   . Hypertension   . IBS (irritable bowel syndrome)   . Ischemic colitis ([place])   . OA (osteoarthritis)    RIGHT SHOULDER/ HIPS  . PAC (premature atrial contraction)   . Peyronie's disease   . Right ureteral stone   . 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 Stroke [facility])     SURGICAL HISTORY: Past Surgical History:  Procedure Laterality Date  . CARDIAC CATHETERIZATION  07-23-2003   Non-obstructive  CAD/  30% narrowing ostium LM,  30-40% eccentric calcified plaque LAD, 20% narrowing RCA/  preserved LVF  . CARDIOVASCULAR STRESS TEST  12-23-2012  DR ALLRED   Low risk lexiscan study with small mild intensity, fixed inferior defect consistent with thinning, no ischemia/  normal LVF and wall motion, ef 66%  . COLONOSCOPY  last one 06-19-2011  . CYSTOSCOPY W/ RETROGRADES Right 07/19/2014   Procedure: CYSTOSCOPY WITH RETROGRADE PYELOGRAM;  Surgeon: Malka So, MD;  Location: [facility];   Service: Urology;  Laterality: Right;  . CYSTOSCOPY/RETROGRADE/URETEROSCOPY/STONE EXTRACTION WITH BASKET Right 07/19/2014   Procedure: RIGHT URETEROSCOPY/STONE EXTRACTION WITH BASKET;  Surgeon: Malka So, MD;  Location: [facility];  Service: Urology;  Laterality: Right;  . EP IMPLANTABLE DEVICE N/A 01/13/2016   Procedure: Loop Recorder Insertion;  Surgeon: Deboraha Sprang, MD;  Location: [facility];  Service: Cardiovascular;  Laterality: N/A;  . ESOPHAGOGASTRODUODENOSCOPY (EGD) WITH ESOPHAGEAL DILATION  02-04-2001  . EXTRACORPOREAL SHOCK WAVE LITHOTRIPSY Right 01-30-2013  . FLEXIBLE SIGMOIDOSCOPY N/A 03/19/2014   Procedure: FLEXIBLE SIGMOIDOSCOPY;  Surgeon: Inda Castle, MD;  Location: [place];  Service: Endoscopy;  Laterality: N/A;  . INGUINAL HERNIA REPAIR Bilateral 03-17-2010  . LOOP RECORDER IMPLANT    . LUMBAR FUSION  03-26-2005   Revision laminectomy L5 - S1,  Laminectomy and Decompression L4-L5, fusion L4 -- S1  . LUMBAR LAMINECTOMY  1999   L5  -- S1  . NASAL SEPTUM SURGERY  YRS AGO  . RADICAL PROSTATECTOMY/ BILATERAL RETROPERITONEAL LYMPH NODE DISSECTION  12-29-2007  . REMOVAL BENIGN RIGHT EYELID LESION  12-19-2007  . SLING CYSTOURETHROPEXY ([facility])  10-12-2009  . TONSILLECTOMY AND ADENOIDECTOMY  as child  . TRANSTHORACIC ECHOCARDIOGRAM  07-13-2014   mild LVH/  ef 60-655,  grade I diastolic dysfunction/  trivial AR, MR and TR/  mild LAE    SOCIAL HISTORY: Social History   Social History  . Marital status: Married    Spouse name: N/A  . Number of children: 2  . Years of education: N/A   Occupational History  . Retired Retired   Social History Main Topics  . Smoking status: Current Every Day Smoker    Packs/day: 1.00    Years: 53.00    Types: Cigarettes  . Smokeless tobacco: Never Used  . Alcohol use No  . Drug use: No  . Sexual activity: Not on file   Other Topics Concern  . Not on file   Social History Narrative   Lives with  wife in an apartment.  Has 2 children.  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  Education: high school.    FAMILY HISTORY: Family History  Problem Relation Age of Onset  . Heart attack Father 26  . Aneurysm Father   . Bone cancer Maternal Grandfather   . Colon cancer Maternal Aunt 28  . Lymphoma Maternal Aunt     ALLERGIES:  is allergic to pseudoephedrine; tape; sulfa antibiotics; sulfacetamide sodium; lodine [etodolac]; and oxycodone.  MEDICATIONS:  Current Outpatient Prescriptions  Medication Sig Dispense Refill  . albuterol (PROVENTIL HFA;VENTOLIN HFA) 108 (90 Base) MCG/ACT inhaler Inhale 2 puffs into the lungs every 6 (six) hours as needed for wheezing or shortness of breath.    . azaTHIOprine (IMURAN) 50 MG tablet Take 2 tablets (100 mg total) by mouth daily. 90 tablet 0  . Calcium Carbonate-Vit D-Min (CALTRATE 600+D PLUS MINERALS) 600-800 MG-UNIT TABS Take 1 tablet  by mouth 2 (two) times daily.    . carbidopa-levodopa (SINEMET IR) 25-100 MG tablet Take 1 tablet by mouth 3 (three) times daily. (Patient taking differently: Take 0.5-1 tablets by mouth See admin instructions. To start 02/06/17: Week 1 - take 1/2 tablet by mouth three times daily - 30 minutes before meals                                                     Week 2 - take 1/2 tablet before breakfast and lunch, take 1 tablet before supper                                                     Week 3 - take 1/2 tablet before breakfast and 1 tablet before lunch and supper                                                      Week 4 - take 1 tablet before breakfast, lunch and supper) 90 tablet 3  . cholecalciferol (VITAMIN D) 1000 units tablet Take 1,000 Units by mouth daily.    Marland Kitchen dicyclomine (BENTYL) 10 MG capsule TAKE 1 CAPSULE THREE TIMES DAILY BEFORE MEALS 270 capsule 1  . diltiazem (TIAZAC) 360 MG 24 hr capsule Take 1 capsule (360 mg total) by mouth daily. 30 capsule 0  . ferrous  sulfate 220 (44 Fe) MG/5ML solution Take 5 mLs (220 mg total) by mouth daily. 473 mL 3  . HYDROcodone-acetaminophen (NORCO/VICODIN) 5-325 MG tablet Take 1 tablet by mouth every 4 (four) hours as needed (pain).    . Multiple Vitamin (MULTIVITAMIN WITH MINERALS) TABS tablet Take 1 tablet by mouth daily.    Marland Kitchen omeprazole (PRILOSEC) 20 MG capsule Take 1 capsule (20 mg total) by mouth 2 (two) times daily. 180 capsule 3  . potassium chloride (KLOR-CON M10) 10 MEQ tablet Take 1 tablet (10 mEq total) by mouth 2 (two) times daily. 180 tablet 1  . predniSONE (DELTASONE) 20 MG tablet Take 1 tablet (20 mg total) by mouth daily with breakfast.    . saccharomyces boulardii (FLORASTOR) 250 MG capsule Take 1 capsule (250 mg total) by mouth 2 (two) times daily. (Patient taking differently: Take 250 mg by mouth 2 (two) times daily. Patient is using OTC Ultimate Flora with 30 billion culture units.) 60 capsule 1   No current facility-administered medications for this visit.     REVIEW OF SYSTEMS:   Constitutional: Denies fevers, chills or abnormal night sweats, (+) fatigue, (+) 12 lbs weight loss in the past 6 months  Eyes: Denies blurriness of vision, double vision or watery eyes Ears, nose, mouth, throat, and face: Denies mucositis or sore throat Respiratory: Denies cough, dyspnea or wheezes Cardiovascular: Denies palpitation, chest discomfort or lower extremity swelling Gastrointestinal:  Denies nausea, heartburn or change in bowel habits, (+) he occasionally has bowel incontinence  Skin: Denies abnormal skin rashes Lymphatics: Denies new lymphadenopathy or easy bruising Neurological:Denies numbness, tingling or new weaknesses, (+) double vision  Behavioral/Psych: Mood is  stable, no new changes  All other systems were reviewed with the patient and are negative.  PHYSICAL EXAMINATION: ECOG PERFORMANCE STATUS: 3 - Symptomatic, >50% confined to bed  Vitals:   02/23/17 1416  BP: 129/72  Pulse: 85  Resp: 18   Temp: 97.9 F (36.6 C)  SpO2: 99%   Filed Weights   02/23/17 1416  Weight: 123 lb (55.8 kg)    GENERAL:alert, no distress and comfortable, sitting in wheelchair SKIN: skin color, texture, turgor are normal, no rashes or significant lesions EYES: normal, conjunctiva are pink and non-injected, sclera clear OROPHARYNX:no exudate, no erythema and lips, buccal mucosa, and tongue normal  NECK: supple, thyroid normal size, non-tender, without nodularity LYMPH:  no palpable lymphadenopathy in the cervical, axillary or inguinal LUNGS: clear to auscultation and percussion with normal breathing effort HEART: regular rate & rhythm and no murmurs and no lower extremity edema ABDOMEN:abdomen soft, non-tender and normal bowel sounds Musculoskeletal:no cyanosis of digits and no clubbing  PSYCH: alert & oriented x 3 with fluent speech NEURO: no focal motor/sensory deficits, except (+) bilateral lack weakness, and shuffled gait.  LABORATORY DATA:  I have reviewed the data as listed CBC Latest Ref Rng & Units 02/23/2017 02/11/2017 01/27/2017  WBC 4.0 - 10.3 10e3/uL 15.0(H) 28.9 cH(HH) 14.9(H)  Hemoglobin 13.0 - 17.1 g/dL 13.9 14.4 12.3(L)  Hematocrit 38.4 - 49.9 % 41.7 42.3 37.2(L)  Platelets 140 - 400 10e3/uL 301 498.0(H) 127(L)    CMP Latest Ref Rng & Units 02/11/2017 01/27/2017 01/26/2017  Glucose 70 - 99 mg/dL 121(H) 107(H) 135(H)  BUN 6 - 23 mg/dL '19 14 13  '$ Creatinine 0.40 - 1.50 mg/dL 0.68 0.49(L) 0.62  Sodium 135 - 145 mEq/L 134(L) 137 139  Potassium 3.5 - 5.1 mEq/L 3.3(L) 4.2 4.6  Chloride 96 - 112 mEq/L 94(L) 108 110  CO2 19 - 32 mEq/L '29 23 23  '$ Calcium 8.4 - 10.5 mg/dL 9.1 8.6(L) 8.6(L)  Total Protein 6.5 - 8.1 g/dL - - 5.1(L)  Total Bilirubin 0.3 - 1.2 mg/dL - - 1.1  Alkaline Phos 38 - 126 U/L - - 53  AST 15 - 41 U/L - - 44(H)  ALT 17 - 63 U/L - - 62     RADIOGRAPHIC STUDIES: I have personally reviewed the radiological images as listed and agreed with the findings in the  report. No results found.  ASSESSMENT & PLAN:  76 year old Caucasian male, with recently diagnosed myasthenia gravis, on prednisone and Imuran, hypertension, history of multiple strokes, presented with laboratory finding of leukocytosis, no recent history of infection.  1. Neutrophilia -He has had leukocytosis, with predominant neutrophilia, since November 2017. He started prednisone in May 2018, and his leukocytosis got worse since then. No history of recurrent infection in the past 10 months, except for one episode of acute bronchitis in general 2018.  -Repeated CBC with differential showed WBC 15K, with ANC 14.2K today, slightly low absolute lymphocytes. I will review his smear -I think this is likely reactive, to his underlying myasthenia gravis, and his medication prednisone. He is also on imuran which some time can cause neutropenia.  -Also I did not have high suspicion, I would like to ruled out myeloproliferative neoplasm, especially CML. I'll check BCR/ABL and JACK2 gene mutation today -No evidence of CLL, or lymphoproliferative disorder, I do not think we need for cytometry. -If the above workup is negative, giving the overall normal RBC and platelet count, I do not think he needs bone marrow biopsy at this point. -  I'll call patient's daughter in 10-14 days, went above test result returns. If the workup is negative, I recommend him to follow-up with his primary care physician Dr. Ronnald Ramp, and I'll see him back if his neutrophilia gets significantly worse, or if he develops other abnormal blood counts.  2. Myasthenia gravis and history of multiple strokes  -He will continue follow-up with his neurologist Dr. Posey Pronto  3. HTN and arthritis   -f/u with Dr. Ronnald Ramp   4. Right arm swelling  -likely positional, he sleeps on right side, I recommend him to use a pillow to elevated right arm when he sleeps -if no improvement, he will contact me or Dr. Ronnald Ramp to get a Doppler to rule out DVT  Plan   -lab today, I'll call patient's daughter to review the results in 1-2 weeks -if his lab work is negative, I'll see him as needed in the future.   All questions were answered. The patient knows to call the clinic with any problems, questions or concerns. I spent 40 minutes counseling the patient face to face. The total time spent in the appointment was 45 minutes and more than 50% was on counseling.     Truitt Merle, MD 02/23/17 4:19 PM

## 2017-02-23 NOTE — Telephone Encounter (Signed)
Appt has been scheduled for the pt to see Dr. Burr Medico on 9/18. Aware to arrive early.

## 2017-02-24 DIAGNOSIS — R531 Weakness: Secondary | ICD-10-CM | POA: Diagnosis not present

## 2017-02-26 ENCOUNTER — Telehealth: Payer: Self-pay | Admitting: Internal Medicine

## 2017-02-26 NOTE — Telephone Encounter (Signed)
[name], with [facility] [phone]  The np was out on the 19th and suggest for pt to take Remeron for depression and lose of appetite.  Is this ok with Dr Ronnald Ramp

## 2017-02-27 DIAGNOSIS — G7000 Myasthenia gravis without (acute) exacerbation: Secondary | ICD-10-CM | POA: Diagnosis not present

## 2017-03-01 ENCOUNTER — Other Ambulatory Visit: Payer: Self-pay | Admitting: Internal Medicine

## 2017-03-01 DIAGNOSIS — F329 Major depressive disorder, single episode, unspecified: Secondary | ICD-10-CM

## 2017-03-01 DIAGNOSIS — F32A Depression, unspecified: Secondary | ICD-10-CM

## 2017-03-01 DIAGNOSIS — F450 Somatization disorder: Principal | ICD-10-CM

## 2017-03-01 MED ORDER — MIRTAZAPINE 7.5 MG PO TABS: 7.5000 mg | ORAL_TABLET | Freq: Every day | ORAL | 1 refills | Status: AC

## 2017-03-01 NOTE — Telephone Encounter (Signed)
RX sent

## 2017-03-01 NOTE — Telephone Encounter (Signed)
Notified Betty w/MD response. Inform rx has been sent to [facility]...mb

## 2017-03-03 IMAGING — MR MR LUMBAR SPINE W/O CM
4 of 5 series · 26 of 48 positions shown · non-contrast
Comparison: CT abdomen and pelvis December 27, 2014 and CT abdomen and
pelvis March 18, 2014

CLINICAL DATA: Low back pain. Bilateral lower extremities and
weakness. Multiple prior back surgeries, last 8446.

EXAM:
MRI LUMBAR SPINE WITHOUT CONTRAST
TECHNIQUE: Multiplanar, multisequence MR imaging of the lumbar spine was
performed. No intravenous contrast was administered.

[Series 3: T2 post-contrast · sagittal · 4.0mm · 0.55mm/px · 5 of 13 slices shown]
[im 1/13]
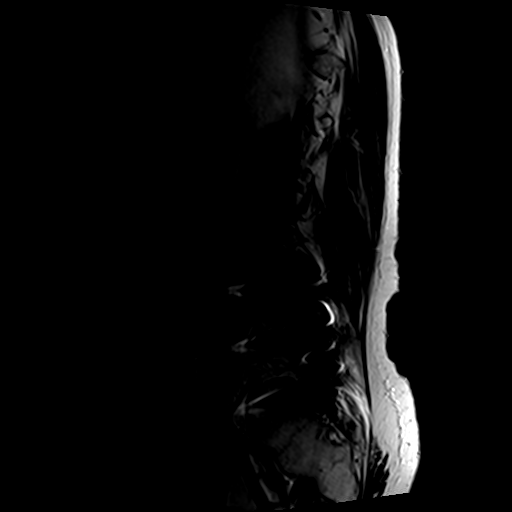
[im 4/13]
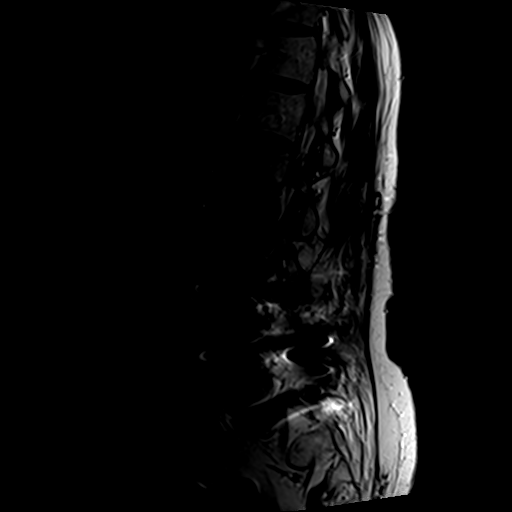
[im 7/13]
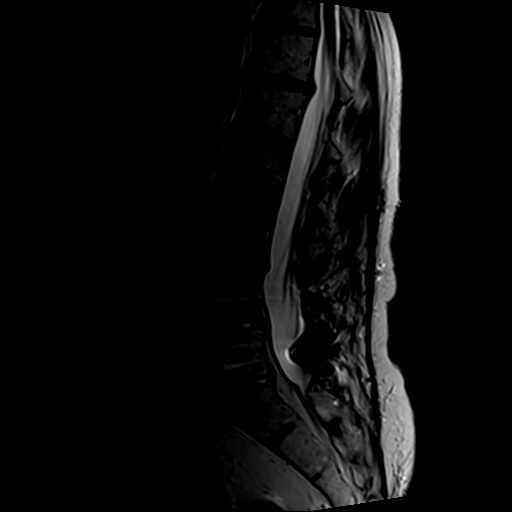
[im 10/13]
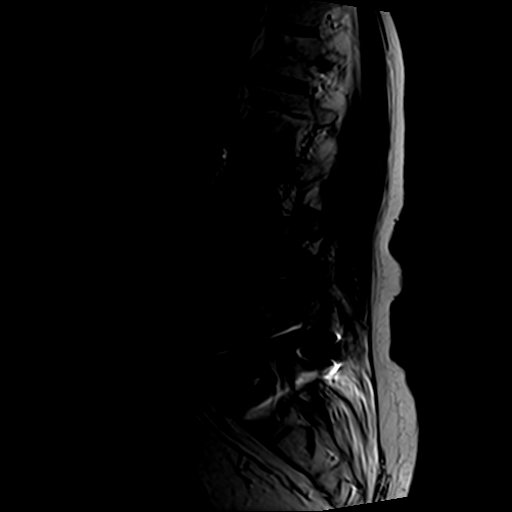
[im 13/13]
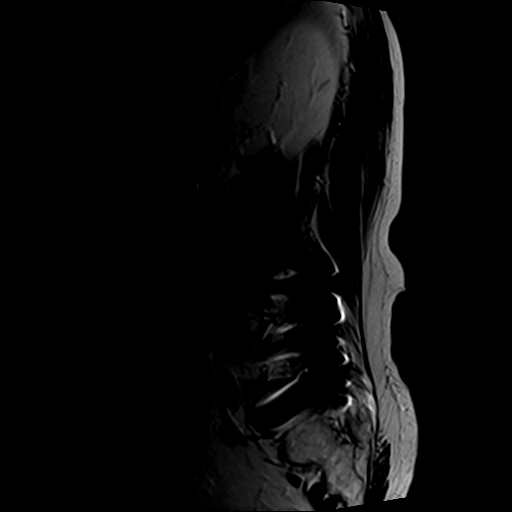

[Series 5: T1 · sagittal · 4.0mm · 0.55mm/px · 6 of 13 slices shown (1 of 2)]
[im 1/13]
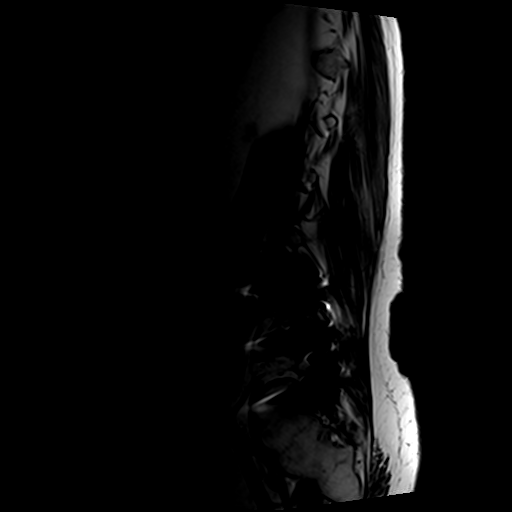
[im 3/13]
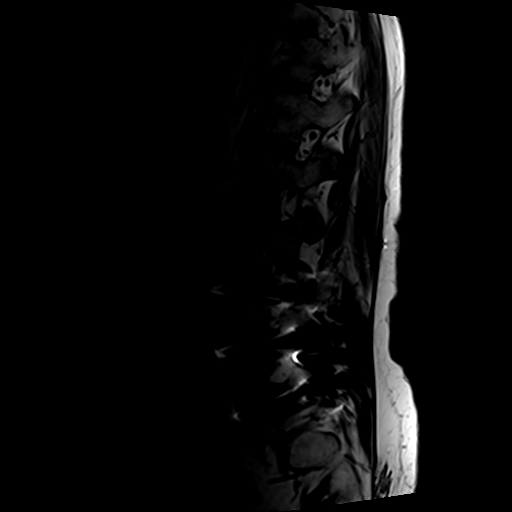
[im 5/13]
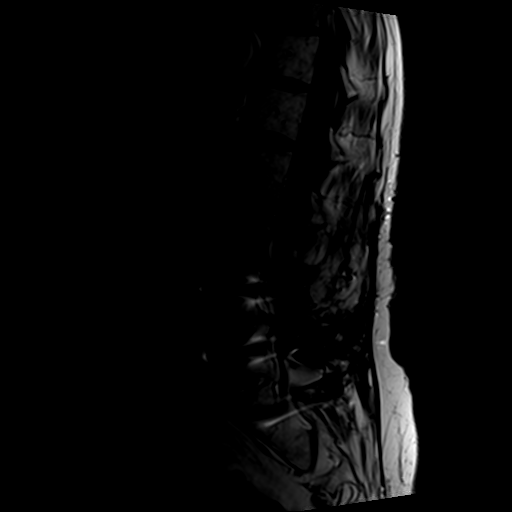
[im 8/13]
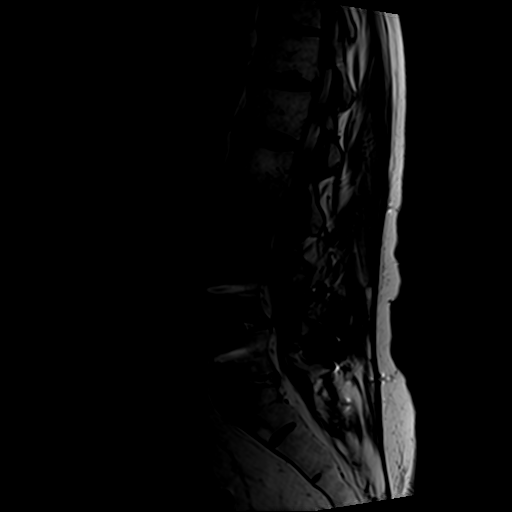
[im 10/13]
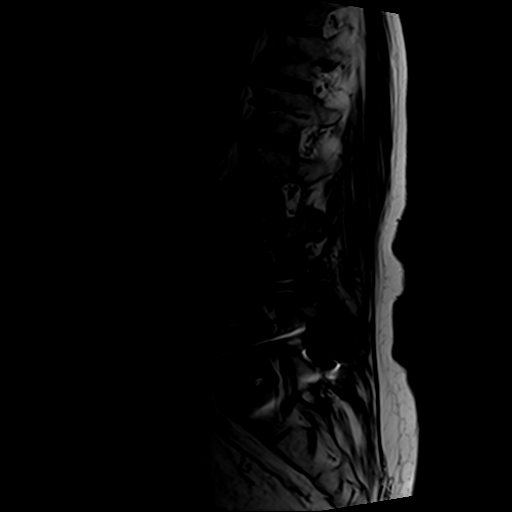
[im 13/13]
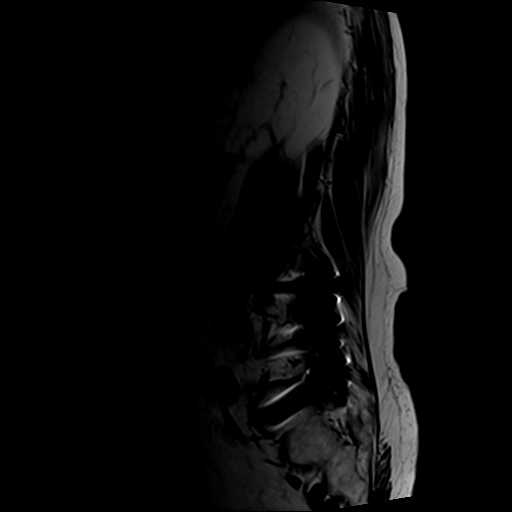

[Series 6: T2 · axial · 4.0mm · 0.70mm/px · z∈[-98,+100]mm · 10 of 37 slices shown]
[im 3/37]
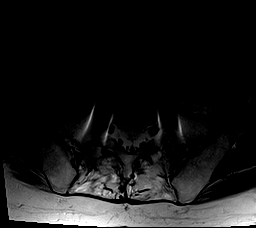
[im 5/37]
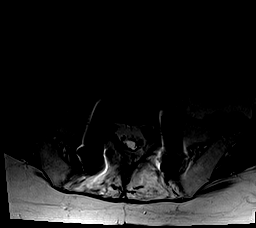
[im 8/37]
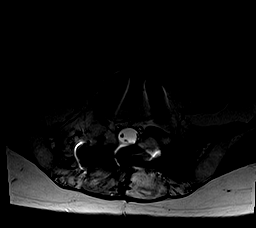
[im 13/37]
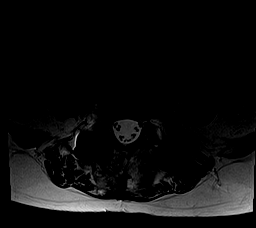
[im 17/37]
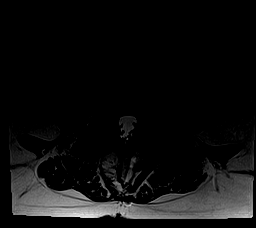
[im 20/37]
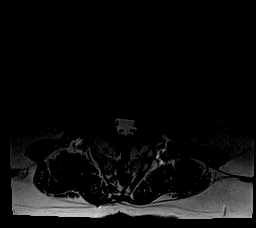
[im 22/37]
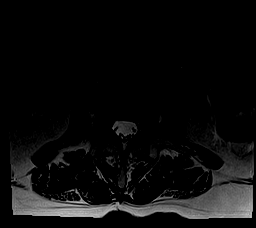
[im 27/37]
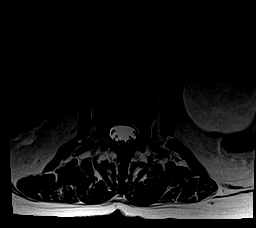
[im 32/37]
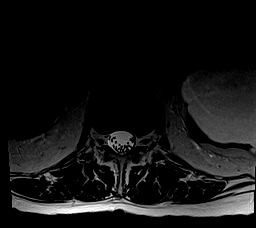
[im 37/37]
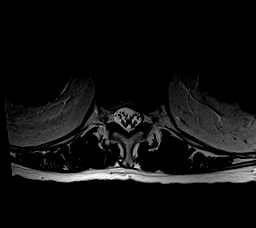

[Series 7: T1 · axial · 4.0mm · 0.35mm/px · z∈[-98,+68]mm · 5 of 37 slices shown (2 of 2)]
[im 3/37]
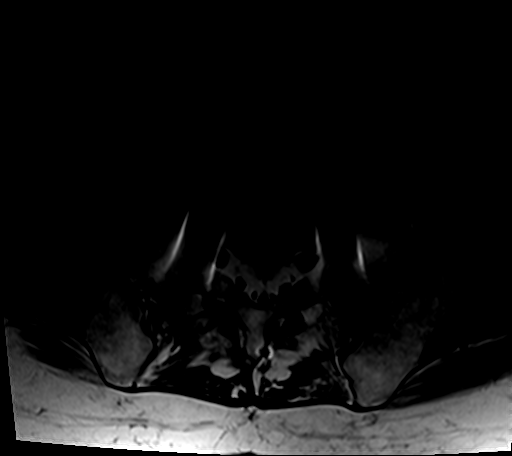
[im 5/37]
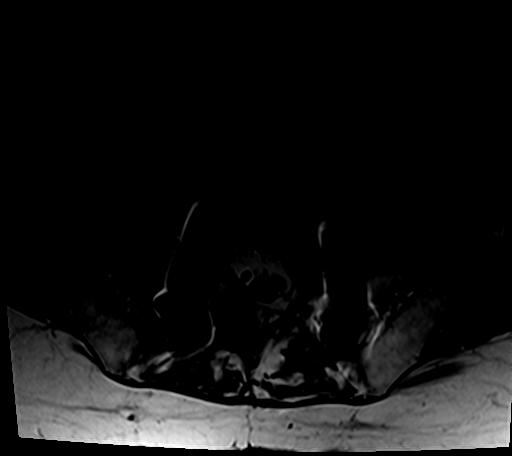
[im 8/37]
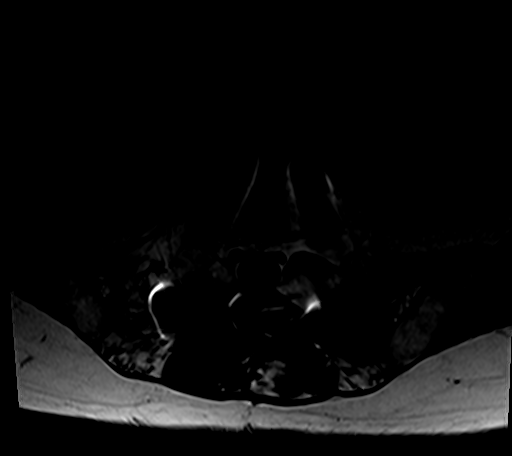
[im 20/37]
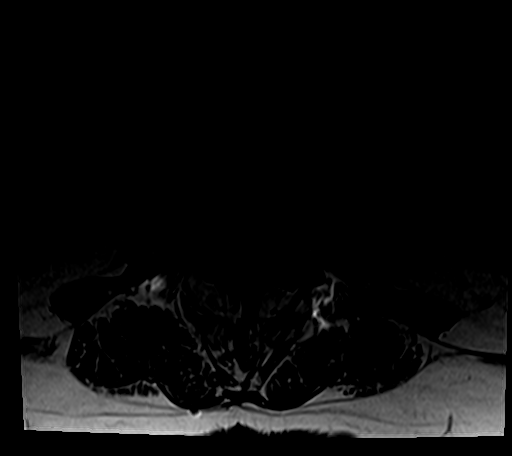
[im 32/37]
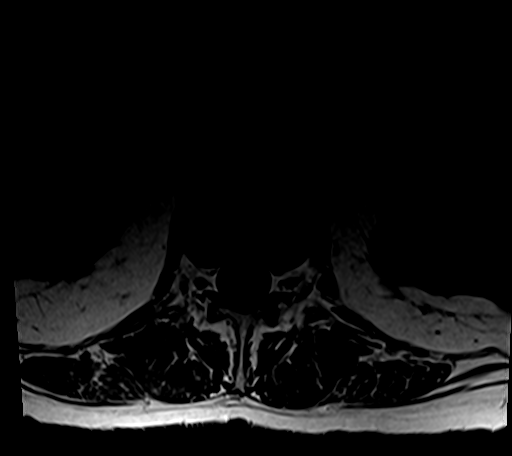

[26 of 48 positions shown; findings below may reference images not displayed]

FINDINGS: SEGMENTATION: For the purposes of this report, the last well-formed
intervertebral disc will be described as L5-S1.

ALIGNMENT: No malalignment.  Maintenance of the lumbar lordosis.

VERTEBRAE:Lumbar vertebral bodies intact. Status post L4-5 and L5-S1
PLIF, arthrodesis better characterized on prior CT. Moderate L3-4
disc height loss with decreased T2 signal with non surgically
altered discs compatible with mild desiccation. No bone marrow
signal abnormality to suggest acute osseous process. Generalized
bright bone marrow signal favoring osteopenia and possible post
radiation changes given history of prostate cancer.

CONUS MEDULLARIS: Conus medullaris terminates at T12-L1 and
demonstrates normal morphology and signal characteristics. Cauda
equina is normal.

PARASPINAL AND SOFT TISSUES: Severe paraspinal muscle atrophy and
below the level surgical intervention. Large LEFT parapelvic cysts
in the included view of the LEFT kidney, present on prior imaging.
Stable LEFT adrenal mass.

DISC LEVELS:

T12-L1: Moderate LEFT central disc protrusion without canal stenosis
or neural foraminal narrowing.

L1-2, L2-3: No disc bulge, canal stenosis nor neural foraminal
narrowing.

L3-4: 3 mm broad-based disc bulge. Moderate facet arthropathy and
ligamentum flavum redundancy. No canal stenosis. Mild to moderate
RIGHT, mild LEFT neural foraminal narrowing.

L4-5 and L5-S1: PLIF, posterior decompression without canal stenosis
or neural from narrowing.
IMPRESSION: Status post L4-5 and L5-S1 PLIF. L3-4 adjacent segment disease
without canal stenosis.

Mild to moderate RIGHT L3-4 neural foraminal narrowing.

## 2017-03-04 ENCOUNTER — Telehealth: Payer: Self-pay | Admitting: Internal Medicine

## 2017-03-04 DIAGNOSIS — M545 Low back pain: Secondary | ICD-10-CM | POA: Diagnosis not present

## 2017-03-04 DIAGNOSIS — M7552 Bursitis of left shoulder: Secondary | ICD-10-CM | POA: Diagnosis not present

## 2017-03-04 DIAGNOSIS — G894 Chronic pain syndrome: Secondary | ICD-10-CM | POA: Diagnosis not present

## 2017-03-04 DIAGNOSIS — M7551 Bursitis of right shoulder: Secondary | ICD-10-CM | POA: Diagnosis not present

## 2017-03-04 NOTE — Telephone Encounter (Signed)
Called Debbie and informed that order is EPIC. Jackelyn Poling mentioned that pt will need a PT evaluation.

## 2017-03-04 NOTE — Telephone Encounter (Signed)
[facility] received office note for the patient from his office visit on 9/6. She would ike to know if these are for the patient to receive a wheelchair Please call back in regard   [phone]

## 2017-03-05 ENCOUNTER — Telehealth: Payer: Self-pay | Admitting: *Deleted

## 2017-03-05 NOTE — Telephone Encounter (Signed)
I called the patient back, spoke with his wife. We reviewed that his WBC was 15K when please repeated his CBC, and abcr/abl, jak2 mutations were all negative. No evidence for CML or MPN. I think his neutropenia is likely reactive, secondary to his steroids, and or other medical conditions. He will continue follow-up with his primary care physician Dr. Ronnald Ramp, and see me as needed in the future.  Truitt Merle  03/05/2017

## 2017-03-05 NOTE — Telephone Encounter (Signed)
Wife Deacon Gadbois called wanting to know results of lab tests done on  02/23/17. Betty's   Phone     [phone].

## 2017-03-15 ENCOUNTER — Telehealth: Payer: Self-pay

## 2017-03-15 NOTE — Telephone Encounter (Signed)
On 03/15/17 I received a d/c from [employer] [facility]) (original). The d/c is for burial. The patient is a patient of Doctor Ronnald Ramp. The d/c will be taken to [facility] this am for signature.  On 03/17/17 I received the d/c back from Doctor Plotnikov who signed the d/c for Doctor Ronnald Ramp. I got the d/c ready and called the funeral home to let them know the d/c is ready for pickup.

## 2017-03-16 NOTE — Telephone Encounter (Signed)
Hospice called to let you know patient passed away on 15-Mar-2017 at  4:40 AM.

## 2017-04-08 DEATH — deceased

## 2017-05-07 ENCOUNTER — Ambulatory Visit: Payer: Medicare HMO | Admitting: Neurology

## 2018-03-07 ENCOUNTER — Telehealth: Payer: Self-pay | Admitting: *Deleted

## 2018-03-07 NOTE — Telephone Encounter (Signed)
"  [name] from [facility] lab call ing for diagnosis code for February 23, 2017 date of service."  Code D72.828 provided at this time.  No further questions or needs at this time.
